# Patient Record
Sex: Female | Born: 1960 | Race: White | Hispanic: No | Smoking: Never smoker
Health system: Southern US, Community
[De-identification: ages and names within clinical notes are randomized; demographics above are authoritative.]

## PROBLEM LIST (undated history)

## (undated) ENCOUNTER — Ambulatory Visit (HOSPITAL_COMMUNITY): Admission: EM | Disposition: A | Discharge status: Home / Self Care | Payer: Medicaid Other | Source: Home / Self Care

## (undated) DIAGNOSIS — G4700 Insomnia, unspecified: Secondary | ICD-10-CM

## (undated) DIAGNOSIS — F32A Depression, unspecified: Secondary | ICD-10-CM

## (undated) DIAGNOSIS — R7989 Other specified abnormal findings of blood chemistry: Secondary | ICD-10-CM

## (undated) DIAGNOSIS — F319 Bipolar disorder, unspecified: Secondary | ICD-10-CM

## (undated) DIAGNOSIS — F1110 Opioid abuse, uncomplicated: Secondary | ICD-10-CM

## (undated) DIAGNOSIS — G4730 Sleep apnea, unspecified: Secondary | ICD-10-CM

## (undated) DIAGNOSIS — M109 Gout, unspecified: Secondary | ICD-10-CM

## (undated) DIAGNOSIS — F1010 Alcohol abuse, uncomplicated: Secondary | ICD-10-CM

## (undated) DIAGNOSIS — I728 Aneurysm of other specified arteries: Secondary | ICD-10-CM

## (undated) DIAGNOSIS — E1142 Type 2 diabetes mellitus with diabetic polyneuropathy: Secondary | ICD-10-CM

## (undated) DIAGNOSIS — F419 Anxiety disorder, unspecified: Secondary | ICD-10-CM

## (undated) DIAGNOSIS — E782 Mixed hyperlipidemia: Secondary | ICD-10-CM

## (undated) DIAGNOSIS — K219 Gastro-esophageal reflux disease without esophagitis: Secondary | ICD-10-CM

## (undated) DIAGNOSIS — I10 Essential (primary) hypertension: Secondary | ICD-10-CM

## (undated) DIAGNOSIS — G4733 Obstructive sleep apnea (adult) (pediatric): Secondary | ICD-10-CM

## (undated) DIAGNOSIS — F329 Major depressive disorder, single episode, unspecified: Secondary | ICD-10-CM

## (undated) DIAGNOSIS — Z8614 Personal history of Methicillin resistant Staphylococcus aureus infection: Secondary | ICD-10-CM

## (undated) DIAGNOSIS — B1920 Unspecified viral hepatitis C without hepatic coma: Secondary | ICD-10-CM

## (undated) DIAGNOSIS — E119 Type 2 diabetes mellitus without complications: Secondary | ICD-10-CM

## (undated) HISTORY — DX: Aneurysm of other specified arteries: I72.8

## (undated) HISTORY — DX: Type 2 diabetes mellitus with diabetic polyneuropathy: E11.42

## (undated) HISTORY — DX: Other specified abnormal findings of blood chemistry: R79.89

## (undated) HISTORY — DX: Obstructive sleep apnea (adult) (pediatric): G47.33

## (undated) HISTORY — DX: Gastro-esophageal reflux disease without esophagitis: K21.9

## (undated) HISTORY — DX: Mixed hyperlipidemia: E78.2

## (undated) HISTORY — PX: BACK SURGERY: SHX140

## (undated) HISTORY — DX: Insomnia, unspecified: G47.00

## (undated) HISTORY — DX: Sleep apnea, unspecified: G47.30

## (undated) HISTORY — PX: WISDOM TOOTH EXTRACTION: SHX21

---

## 2011-09-18 ENCOUNTER — Encounter: Payer: Self-pay | Admitting: Adult Health

## 2011-09-18 ENCOUNTER — Emergency Department (HOSPITAL_COMMUNITY)
Admission: EM | Admit: 2011-09-18 | Discharge: 2011-09-19 | Disposition: A | Discharge status: Other Acute Inpatient Hospital | Payer: Self-pay | Attending: Emergency Medicine | Admitting: Emergency Medicine

## 2011-09-18 ENCOUNTER — Emergency Department (HOSPITAL_COMMUNITY): Payer: Self-pay

## 2011-09-18 ENCOUNTER — Other Ambulatory Visit: Payer: Self-pay

## 2011-09-18 DIAGNOSIS — T50901A Poisoning by unspecified drugs, medicaments and biological substances, accidental (unintentional), initial encounter: Secondary | ICD-10-CM | POA: Insufficient documentation

## 2011-09-18 DIAGNOSIS — R413 Other amnesia: Secondary | ICD-10-CM | POA: Insufficient documentation

## 2011-09-18 DIAGNOSIS — T50902A Poisoning by unspecified drugs, medicaments and biological substances, intentional self-harm, initial encounter: Secondary | ICD-10-CM | POA: Insufficient documentation

## 2011-09-18 DIAGNOSIS — R000 Tachycardia, unspecified: Secondary | ICD-10-CM | POA: Insufficient documentation

## 2011-09-18 DIAGNOSIS — R45851 Suicidal ideations: Secondary | ICD-10-CM | POA: Insufficient documentation

## 2011-09-18 DIAGNOSIS — IMO0002 Reserved for concepts with insufficient information to code with codable children: Secondary | ICD-10-CM | POA: Insufficient documentation

## 2011-09-18 HISTORY — DX: Major depressive disorder, single episode, unspecified: F32.9

## 2011-09-18 HISTORY — DX: Depression, unspecified: F32.A

## 2011-09-18 LAB — COMPREHENSIVE METABOLIC PANEL
ALT: 121 U/L — ABNORMAL HIGH (ref 0–35)
AST: 116 U/L — ABNORMAL HIGH (ref 0–37)
Albumin: 3.3 g/dL — ABNORMAL LOW (ref 3.5–5.2)
Alkaline Phosphatase: 117 U/L (ref 39–117)
BUN: 8 mg/dL (ref 6–23)
CO2: 25 mEq/L (ref 19–32)
Calcium: 9.4 mg/dL (ref 8.4–10.5)
Chloride: 104 mEq/L (ref 96–112)
Creatinine, Ser: 0.86 mg/dL (ref 0.50–1.10)
GFR calc Af Amer: >90 mL/min (ref >90)
GFR calc non Af Amer: 79 mL/min — ABNORMAL LOW (ref >90)
Glucose, Bld: 137 mg/dL — ABNORMAL HIGH (ref 70–99)
Potassium: 3.7 mEq/L (ref 3.5–5.1)
Sodium: 141 mEq/L (ref 135–145)
Total Bilirubin: 0.4 mg/dL (ref 0.3–1.2)
Total Protein: 7 g/dL (ref 6.0–8.3)

## 2011-09-18 LAB — SALICYLATE LEVEL: Salicylate Lvl: <2 mg/dL — ABNORMAL LOW (ref 2.8–20.0)

## 2011-09-18 LAB — CBC
HCT: 44.7 % (ref 36.0–46.0)
Hemoglobin: 15.4 g/dL — ABNORMAL HIGH (ref 12.0–15.0)
MCH: 31.8 pg (ref 26.0–34.0)
MCHC: 34.5 g/dL (ref 30.0–36.0)
MCV: 92.4 fL (ref 78.0–100.0)
Platelets: 271 10*3/uL (ref 150–400)
RBC: 4.84 MIL/uL (ref 3.87–5.11)
RDW: 13.4 % (ref 11.5–15.5)
WBC: 6.9 10*3/uL (ref 4.0–10.5)

## 2011-09-18 LAB — LACTIC ACID, PLASMA: Lactic Acid, Venous: 2.5 mmol/L — ABNORMAL HIGH (ref 0.5–2.2)

## 2011-09-18 LAB — RAPID URINE DRUG SCREEN, HOSP PERFORMED
Amphetamines: NOT DETECTED
Barbiturates: NOT DETECTED
Benzodiazepines: POSITIVE — AB
Cocaine: POSITIVE — AB
Opiates: NOT DETECTED
Tetrahydrocannabinol: NOT DETECTED

## 2011-09-18 LAB — POCT PREGNANCY, URINE: Preg Test, Ur: NEGATIVE

## 2011-09-18 LAB — ACETAMINOPHEN LEVEL: Acetaminophen (Tylenol), Serum: <15 ug/mL (ref 10–30)

## 2011-09-18 LAB — ETHANOL: Alcohol, Ethyl (B): 188 mg/dL — ABNORMAL HIGH (ref 0–11)

## 2011-09-18 LAB — TROPONIN I: Troponin I: <0.3 ng/mL (ref <0.30)

## 2011-09-18 LAB — PREGNANCY, URINE: Preg Test, Ur: NEGATIVE

## 2011-09-18 LAB — AMMONIA

## 2011-09-18 MED ORDER — ONDANSETRON HCL 4 MG PO TABS
4.0000 mg | ORAL_TABLET | Freq: Three times a day (TID) | ORAL | Status: DC | PRN
Start: 2011-09-18 — End: 2011-09-19

## 2011-09-18 MED ORDER — HALOPERIDOL LACTATE 5 MG/ML IJ SOLN
INTRAMUSCULAR | Status: AC
  Administered 2011-09-18: 22:00:00
  Filled 2011-09-18: qty 2

## 2011-09-18 MED ORDER — ACETAMINOPHEN 325 MG PO TABS: 650.0000 mg | ORAL_TABLET | ORAL | Status: DC | PRN

## 2011-09-18 MED ORDER — HALOPERIDOL LACTATE 5 MG/ML IJ SOLN
5.0000 mg | Freq: Once | INTRAMUSCULAR | Status: AC
  Administered 2011-09-18: 5 mg via INTRAMUSCULAR
  Filled 2011-09-18: qty 1

## 2011-09-18 MED ORDER — LORAZEPAM 1 MG PO TABS: 1.0000 mg | ORAL_TABLET | Freq: Three times a day (TID) | ORAL | Status: DC | PRN

## 2011-09-18 MED ORDER — HALOPERIDOL LACTATE 5 MG/ML IJ SOLN: 10.0000 mg | Freq: Once | INTRAMUSCULAR | Status: DC

## 2011-09-18 MED ORDER — HALOPERIDOL LACTATE 5 MG/ML IJ SOLN: 5.0000 mg | Freq: Once | INTRAMUSCULAR | Status: DC

## 2011-09-18 MED ORDER — SODIUM CHLORIDE 0.9 % IV BOLUS (SEPSIS)
1000.0000 mL | Freq: Once | INTRAVENOUS | Status: AC
  Administered 2011-09-18: 1000 mL via INTRAVENOUS

## 2011-09-18 NOTE — ED Notes (Signed)
Patient is resting comfortably. 

## 2011-09-18 NOTE — ED Notes (Signed)
Called to bedside per pt request-pt requesting foley cath removal-pt is verbally abusive, cursing and yelling, threatening-explained need for foley due to restraints-pt remains cursing and verbally abusive-Dr. Manus Gunning is aware-pt has been medicated-restraints x 4-pt threatening to pull foley out-[employer] with pt-IVC papers in process

## 2011-09-18 NOTE — ED Notes (Signed)
Pt requesting chief administrator of hospital. Pt verbally abusive to staff. Drowsy.

## 2011-09-18 NOTE — ED Notes (Signed)
Reported overdose on 150 mg of ambien, 200 mg of prozac and 30 mg of clonezepam. Pt is combative

## 2011-09-18 NOTE — ED Notes (Signed)
ION:GEXB<MW> Expected date:<BR> Expected time:<BR> Means of arrival:<BR> Comments:<BR> EMS/suicidal-took &gt;100 ambien and unknown prozac

## 2011-09-18 NOTE — ED Notes (Signed)
Pt screaming, "Kill me, I want to die, give me something to kill me. Your not helping me."

## 2011-09-18 NOTE — ED Notes (Signed)
Pt verbally abusive to staff, explained need and reason for catheter. Pt continues to try to release restraints

## 2011-09-18 NOTE — ED Notes (Signed)
Taken out of bilateral upper and lower extremity restraints, pt is resting at this time and cooperative. Pt verbalized understanding.

## 2011-09-18 NOTE — ED Notes (Signed)
Charge nurse at bedside. Explained need and reason for catheter. Dr. Manus Gunning notified. Pt continueso scream out with verbal abuse calling staff members names and screaming curses at RN. Pt threatened RN life. Stated she is going to kill Charity fundraiser.

## 2011-09-18 NOTE — ED Notes (Signed)
Pt complaining of foley causing pain.  MD notified, Pt informed of why we have the foley in (I&O) and kidney function.  Pt screaming, and swearing at staff and husband.  MD states that it is okay to remove.

## 2011-09-18 NOTE — ED Notes (Signed)
Pt being verbally abusive of staff. Pt requesting to have catheter pulled out. Family at bedside. Family understands reason for catheter.

## 2011-09-19 ENCOUNTER — Inpatient Hospital Stay (HOSPITAL_COMMUNITY)
Admission: AD | Admit: 2011-09-19 | Discharge: 2011-09-26 | DRG: 885 | Payer: Self-pay | Source: Ambulatory Visit | Attending: Psychiatry | Admitting: Psychiatry

## 2011-09-19 ENCOUNTER — Encounter (HOSPITAL_COMMUNITY): Payer: Self-pay

## 2011-09-19 DIAGNOSIS — F4310 Post-traumatic stress disorder, unspecified: Secondary | ICD-10-CM

## 2011-09-19 DIAGNOSIS — J449 Chronic obstructive pulmonary disease, unspecified: Secondary | ICD-10-CM

## 2011-09-19 DIAGNOSIS — Z886 Allergy status to analgesic agent status: Secondary | ICD-10-CM

## 2011-09-19 DIAGNOSIS — F1010 Alcohol abuse, uncomplicated: Secondary | ICD-10-CM

## 2011-09-19 DIAGNOSIS — F4329 Adjustment disorder with other symptoms: Secondary | ICD-10-CM | POA: Diagnosis present

## 2011-09-19 DIAGNOSIS — J4489 Other specified chronic obstructive pulmonary disease: Secondary | ICD-10-CM

## 2011-09-19 DIAGNOSIS — Z818 Family history of other mental and behavioral disorders: Secondary | ICD-10-CM

## 2011-09-19 DIAGNOSIS — Z590 Homelessness: Secondary | ICD-10-CM

## 2011-09-19 DIAGNOSIS — F1320 Sedative, hypnotic or anxiolytic dependence, uncomplicated: Secondary | ICD-10-CM

## 2011-09-19 DIAGNOSIS — F1910 Other psychoactive substance abuse, uncomplicated: Secondary | ICD-10-CM

## 2011-09-19 DIAGNOSIS — T50902A Poisoning by unspecified drugs, medicaments and biological substances, intentional self-harm, initial encounter: Secondary | ICD-10-CM

## 2011-09-19 DIAGNOSIS — T50901A Poisoning by unspecified drugs, medicaments and biological substances, accidental (unintentional), initial encounter: Secondary | ICD-10-CM

## 2011-09-19 DIAGNOSIS — F332 Major depressive disorder, recurrent severe without psychotic features: Principal | ICD-10-CM

## 2011-09-19 DIAGNOSIS — F3162 Bipolar disorder, current episode mixed, moderate: Secondary | ICD-10-CM | POA: Diagnosis present

## 2011-09-19 MED ORDER — LORAZEPAM 1 MG PO TABS
1.0000 mg | ORAL_TABLET | Freq: Three times a day (TID) | ORAL | Status: DC | PRN
  Administered 2011-09-19: 1 mg via ORAL
  Filled 2011-09-19: qty 1

## 2011-09-19 MED ORDER — MAGNESIUM HYDROXIDE 400 MG/5ML PO SUSP: 30.0000 mL | Freq: Every day | ORAL | Status: DC | PRN

## 2011-09-19 MED ORDER — ZIPRASIDONE MESYLATE 20 MG IM SOLR: 20.0000 mg | Freq: Two times a day (BID) | INTRAMUSCULAR | Status: DC | PRN

## 2011-09-19 MED ORDER — ACETAMINOPHEN 325 MG PO TABS: 650.0000 mg | ORAL_TABLET | Freq: Four times a day (QID) | ORAL | Status: DC | PRN

## 2011-09-19 MED ORDER — ALUM & MAG HYDROXIDE-SIMETH 200-200-20 MG/5ML PO SUSP: 30.0000 mL | ORAL | Status: DC | PRN

## 2011-09-19 NOTE — ED Provider Notes (Signed)
History     CSN: [id number]  Arrival date & time 09/18/11  2023   First MD Initiated Contact with Patient 09/18/11 2039      Chief Complaint  Patient presents with  . Drug Overdose    (Consider location/radiation/quality/duration/timing/severity/associated sxs/prior treatment) HPI Comments: Reported overdose of prozac, ambien and klonopin, sometime this afternoon, reportedly 2-3 hours ago.  PAtient waxing and waning between somnolence and agitation.  Admits was suicide attempt.  No evidence of trauma.  The history is provided by the patient and the EMS personnel.    Past Medical History  Diagnosis Date  . Depression   . COPD (chronic obstructive pulmonary disease)   . Asthma     History reviewed. No pertinent past surgical history.  History reviewed. No pertinent family history.  History  Substance Use Topics  . Smoking status: Not on file  . Smokeless tobacco: Not on file  . Alcohol Use: Yes    OB History    Grav Para Term Preterm Abortions TAB SAB Ect Mult Living                  Review of Systems  Unable to perform ROS: Mental status change    Allergies  Aspirin  Home Medications  No current outpatient prescriptions on file.  BP 115/62  Pulse 87  Temp(Src) 98.2 F (36.8 C) (Core (Comment))  Resp 18  SpO2 5%  Physical Exam  Constitutional: She appears well-developed and well-nourished.       Combatitve, screaming obscenities  HENT:  Head: Normocephalic and atraumatic.  Mouth/Throat: Oropharynx is clear and moist. No oropharyngeal exudate.  Eyes: Conjunctivae and EOM are normal. Pupils are equal, round, and reactive to light.  Neck: Normal range of motion. Neck supple.       No meningismus  Cardiovascular: Normal rate, regular rhythm and normal heart sounds.        tachycardic  Pulmonary/Chest: Breath sounds normal. No respiratory distress.  Abdominal: Soft. There is no tenderness. There is no rebound and no guarding.  Musculoskeletal:  Normal range of motion. She exhibits no edema.  Neurological: She is alert.       noncooperative with exam.  moveing all extremities, no apparent deficits  Skin: Skin is warm and dry.  Psychiatric: Her affect is angry, labile and inappropriate. Her speech is rapid and/or pressured. She is agitated, aggressive and combative. Cognition and memory are impaired. She expresses impulsivity and inappropriate judgment. She expresses suicidal ideation. She exhibits abnormal recent memory and abnormal remote memory.    ED Course  Procedures (including critical care time)  Labs Reviewed  CBC - Abnormal; Notable for the following:    Hemoglobin 15.4 (*)    All other components within normal limits  ETHANOL - Abnormal; Notable for the following:    Alcohol, Ethyl (B) 188 (*)    All other components within normal limits  COMPREHENSIVE METABOLIC PANEL - Abnormal; Notable for the following:    Glucose, Bld 137 (*)    Albumin 3.3 (*)    AST 116 (*)    ALT 121 (*)    GFR calc non Af Amer 79 (*)    All other components within normal limits  URINE RAPID DRUG SCREEN (HOSP PERFORMED) - Abnormal; Notable for the following:    Cocaine POSITIVE (*)    Benzodiazepines POSITIVE (*)    All other components within normal limits  SALICYLATE LEVEL - Abnormal; Notable for the following:    Salicylate Lvl <2.0 (*)  All other components within normal limits  LACTIC ACID, PLASMA - Abnormal; Notable for the following:    Lactic Acid, Venous 2.5 (*)    All other components within normal limits  ACETAMINOPHEN LEVEL  PREGNANCY, URINE  AMMONIA  TROPONIN I  POCT PREGNANCY, URINE  POCT PREGNANCY, URINE   Dg Chest Portable 1 View  09/18/2011  *RADIOLOGY REPORT*  Clinical Data: Altered mental status  PORTABLE CHEST - 1 VIEW  Comparison: None  Findings: The heart size and mediastinal contours are within normal limits.  Both lungs are clear.  The visualized skeletal structures are unremarkable.  IMPRESSION: No acute  cardiopulmonary abnormalities  Original Report Authenticated By: Rosealee Albee, M.D.     1. Suicidal ideation   2. Drug overdose       MDM  Reported overdose on prozac, ambien, klonopin.  Tachycardic, normothermic, BP stable.  Smells of alcohol.  NO trauma, no apparent neuro deficits.  Skin warm and dry. No evidence of serotonin syndrome. No clonus or tremors.  NO hyperthermia. Supportive care.  Tox labs, EKG, chemical and physical restraint prn.     Date: 09/19/2011  Rate: 109  Rhythm: sinus tachycardia  QRS Axis: normal  Intervals: normal  ST/T Wave abnormalities: normal  Conduction Disutrbances:none  Narrative Interpretation:   Old EKG Reviewed: none available  CRITICAL CARE Performed by: Glynn Octave   Total critical care time: 30  Critical care time was exclusive of separately billable procedures and treating other patients.  Critical care was necessary to treat or prevent imminent or life-threatening deterioration.  Critical care was time spent personally by me on the following activities: development of treatment plan with patient and/or surrogate as well as nursing, discussions with consultants, evaluation of patient's response to treatment, examination of patient, obtaining history from patient or surrogate, ordering and performing treatments and interventions, ordering and review of laboratory studies, ordering and review of radiographic studies, pulse oximetry and re-evaluation of patient's condition.   Glynn Octave, MD 09/19/11 [phone]

## 2011-09-19 NOTE — ED Notes (Signed)
Received a phone call from [place], [place] RN, pertaining to patient's belongings. No belongings found in ED or with security. Consulting civil engineer notified.

## 2011-09-19 NOTE — ED Notes (Signed)
Please call Jonny Ruiz, husband with any changes.

## 2011-09-19 NOTE — ED Notes (Signed)
O2 sats remain elevated with blow by O2 .  Continues with sitter 1:1

## 2011-09-19 NOTE — BH Assessment (Signed)
Assessment Note   Kristina Huffman is a 50 y.o. female who presents to [facility] with SI/Depression.  Pt reportedly overdosed on 150mg  of ambien, 200mg  of prozac and 30mg  of clonzepam.  Per pt.'s spouse she ingested 15 ambien and 10 pills of unknown origin.  Pt is drowsy, lethargic, info gathered from spouse and physician.  Upon arrival to ed, pt was verbally abusive to staff and would not allow med tx(refused foley); pt also restrained to allow for tx.  Pt given meds to calm her.  Per spouse, pt has been depressed and in the hosp([facility]) 3x's since son committed SI in 03/2011.  This is the 1st attempt to harm self, pt has only had SI thoughts in the past.  Pt told staff/[employer] that has been having difficult time this holiday without son.  Pt is now calm and sleeping.  Pt is IVC.  Pt denied regular SA use but positive for cocaine. Info sent to [facility] to review.    Axis I: Major Depression, Recurrent severe Axis II: Deferred Axis III:  Past Medical History  Diagnosis Date  . Depression   . COPD (chronic obstructive pulmonary disease)   . Asthma    Axis IV: other psychosocial or environmental problems and problems with primary support group Axis V: 21-30 behavior considerably influenced by delusions or hallucinations OR serious impairment in judgment, communication OR inability to function in almost all areas  Past Medical History:  Past Medical History  Diagnosis Date  . Depression   . COPD (chronic obstructive pulmonary disease)   . Asthma     History reviewed. No pertinent past surgical history.  Family History: History reviewed. No pertinent family history.  Social History:  does not have a smoking history on file. She does not have any smokeless tobacco history on file. She reports that she drinks alcohol. She reports that she uses illicit drugs (Cocaine).  Additional Social History:  Alcohol / Drug Use Pain Medications: None  Prescriptions: None  Over the Counter: None  History of  alcohol / drug use?: No history of alcohol / drug abuse Longest period of sobriety (when/how long): None  Allergies:  Allergies  Allergen Reactions  . Aspirin     Home Medications:  Medications Prior to Admission  Medication Dose Route Frequency Provider Last Rate Last Dose  . acetaminophen (TYLENOL) tablet 650 mg  650 mg Oral Q4H PRN Glynn Octave, MD      . haloperidol lactate (HALDOL) 5 MG/ML injection           . haloperidol lactate (HALDOL) injection 5 mg  5 mg Intramuscular Once Glynn Octave, MD   5 mg at 09/18/11 2138  . haloperidol lactate (HALDOL) injection 5 mg  5 mg Intramuscular Once Glynn Octave, MD      . LORazepam (ATIVAN) tablet 1 mg  1 mg Oral Q8H PRN Glynn Octave, MD      . ondansetron [facility]) tablet 4 mg  4 mg Oral Q8H PRN Glynn Octave, MD      . sodium chloride 0.9 % bolus 1,000 mL  1,000 mL Intravenous Once Glynn Octave, MD   1,000 mL at 09/18/11 2120  . DISCONTD: haloperidol lactate (HALDOL) injection 10 mg  10 mg Intramuscular Once Glynn Octave, MD       No current outpatient prescriptions on file as of 09/18/2011.    OB/GYN Status:  No LMP recorded.  General Assessment Data Location of Assessment: [facility] ED Living Arrangements: Spouse/significant other Can pt return  to current living arrangement?: Yes Admission Status: Involuntary Is patient capable of signing voluntary admission?: No Transfer from: Acute Hospital Referral Source: MD  Education Status Is patient currently in school?: No Current Grade: None  Highest grade of school patient has completed: None  Name of school: None  Contact person: None   Risk to self Suicidal Ideation: Yes-Currently Present Suicidal Intent: Yes-Currently Present Is patient at risk for suicide?: Yes Suicidal Plan?: Yes-Currently Present Specify Current Suicidal Plan: Overdose on Medications  Access to Means: Yes Specify Access to Suicidal Means: Medications  What has been your use of  drugs/alcohol within the last 12 months?: None--Pt had dinner wine tonite per spouse  Previous Attempts/Gestures: No How many times?: 0  Other Self Harm Risks: None  Triggers for Past Attempts: Other (Comment) (Son commited SI in 03/2011) Intentional Self Injurious Behavior: None Family Suicide History: No Recent stressful life event(s): Trauma (Comment) (Pt.'s son comitted SI 03/2011) Persecutory voices/beliefs?: No Depression: Yes Depression Symptoms: Tearfulness;Loss of interest in usual pleasures;Feeling worthless/self pity Substance abuse history and/or treatment for substance abuse?: No Suicide prevention information given to non-admitted patients: Not applicable  Risk to Others Homicidal Ideation: No Thoughts of Harm to Others: No Current Homicidal Intent: No Current Homicidal Plan: No Access to Homicidal Means: No Identified Victim: None  History of harm to others?: Yes Assessment of Violence: On admission Violent Behavior Description: Pt. verbally abusive to staff, trying to remove IV's  Does patient have access to weapons?: No Criminal Charges Pending?: No Does patient have a court date: No  Psychosis Hallucinations: None noted Delusions: None noted  Mental Status Report Appear/Hygiene: Disheveled Eye Contact: Poor Motor Activity: Unremarkable Speech: Slurred;Slow Level of Consciousness: Drowsy Mood: Depressed;Despair;Anhedonia;Sad Affect: Depressed;Sad Anxiety Level: None Thought Processes: Relevant Judgement: Impaired Orientation: Person;Place;Time;Situation Obsessive Compulsive Thoughts/Behaviors: None  Cognitive Functioning Concentration: Normal Memory: Recent Intact;Remote Intact IQ: Average Insight: Poor Impulse Control: Poor Appetite: Poor Weight Loss: 0  Weight Gain: 0  Sleep: Decreased Total Hours of Sleep: 4  Vegetative Symptoms: None  Prior Inpatient Therapy Prior Inpatient Therapy: Yes Prior Therapy Dates: 2012 Prior Therapy  Facilty/Provider(s): [facility]  Reason for Treatment: Depression/SI   Prior Outpatient Therapy Prior Outpatient Therapy: No Prior Therapy Dates: None  Prior Therapy Facilty/Provider(s): None  Reason for Treatment: None   ADL Screening (condition at time of admission) Patient's cognitive ability adequate to safely complete daily activities?: Yes Patient able to express need for assistance with ADLs?: Yes Independently performs ADLs?: Yes Weakness of Legs: None Weakness of Arms/Hands: None       Abuse/Neglect Assessment (Assessment to be complete while patient is alone) Physical Abuse: Denies Verbal Abuse: Denies Sexual Abuse: Denies Exploitation of patient/patient's resources: Denies Self-Neglect: Denies Values / Beliefs Cultural Requests During Hospitalization: None Spiritual Requests During Hospitalization: None Consults Spiritual Care Consult Needed: No Social Work Consult Needed: No Merchant navy officer (For Healthcare) Advance Directive: Patient does not have advance directive;Patient would not like information Pre-existing out of facility DNR order (yellow form or pink MOST form): No    Additional Information 1:1 In Past 12 Months?: No CIRT Risk: No Elopement Risk: No Does patient have medical clearance?: Yes     Disposition:  Disposition Disposition of Patient: Inpatient treatment program;Referred to [facility] ) Type of inpatient treatment program: Adult Patient referred to: Other (Comment) [facility])  On Site Evaluation by:   Reviewed with Physician:     Murrell Redden 09/19/2011 12:22 AM

## 2011-09-19 NOTE — Progress Notes (Signed)
Patient ID: Kristina Huffman, female   DOB: 15-Jan-1961, 50 y.o.   MRN: [id number] 09/19/2011 Kristina Huffman is having a difficult time...Kristina Huffman KitchenSHe is here S/ P OD attempt and now is in the hospital ( for continued assessment of back pain demanding all types of  Of treatments , medications and therpaies.

## 2011-09-19 NOTE — Progress Notes (Signed)
Patient has been accepted to [facility] by [place] bed 504-1. Patient's support paperwork has been completed. EDP notified and is in agreement with disposition. EDP will discharge pt to [facility]. Pt nurse notified as well. ALL appropriate paperwork completed and forwarded to [facility] for review.   Ileene Hutchinson , MSW, LCSWA 09/19/2011 9:01 AM [phone]

## 2011-09-19 NOTE — ED Notes (Signed)
Patient received sleeping soundly but easily arousable.  Blow by O2 on sating at 92%

## 2011-09-19 NOTE — Tx Team (Signed)
Initial Interdisciplinary Treatment Plan  PATIENT STRENGTHS: (choose at least two) Ability for insight Average or above average intelligence Capable of independent living Communication skills Supportive family/friends  PATIENT STRESSORS: Financial difficulties Loss of son* Substance abuse Traumatic event   PROBLEM LIST: Problem List/Patient Goals Date to be addressed Date deferred Reason deferred Estimated date of resolution  Depression 12/28     Cocaine abuse recently 12/28     ETOH recently 12/28                                          DISCHARGE CRITERIA:  Adequate post-discharge living arrangements Improved stabilization in mood, thinking, and/or behavior Reduction of life-threatening or endangering symptoms to within safe limits Withdrawal symptoms are absent or subacute and managed without 24-hour nursing intervention  PRELIMINARY DISCHARGE PLAN: Attend aftercare/continuing care group Outpatient therapy Return to previous living arrangement  PATIENT/FAMIILY INVOLVEMENT: This treatment plan has been presented to and reviewed with the patient, Kristina Huffman, and/or family member,.  The patient and family have been given the opportunity to ask questions and make suggestions.  Manuela Schwartz [facility] 09/19/2011, 1:34 PM

## 2011-09-19 NOTE — Progress Notes (Signed)
Patient ID: Kristina Huffman, female   DOB: Mar 05, 1961, 50 y.o.   MRN: [id number]   Patient is a 50yr old invol admission. Overdosed on 150mg  of ambien, 200mg  of prozac, and 30mg  of klonopin. Has been at [facility] in the past for depression x 2 since 53yr old son passed away back in 05/03/23 of this year. Patient agitated and combative when first in ED. Also had ETOH and cocaine in system. Reports back surgery in past but uses over the counter meds for this. Allergic to ASA. Cooperative with admission but still feels sedated from overdose.

## 2011-09-19 NOTE — ED Notes (Signed)
Patient denies pain and is resting comfortably.  

## 2011-09-19 NOTE — ED Notes (Signed)
Vital signs stable. 

## 2011-09-19 NOTE — ED Notes (Signed)
Patient is resting comfortably. 

## 2011-09-19 NOTE — H&P (Signed)
Psychiatric Admission Assessment Adult  Patient Identification:  Kristina Huffman Date of Evaluation:  09/19/2011 Chief Complaint:  Major Depressive Disorder, recurrent, severe, without psychotic features 296.33  History of Present Illness::Kristina Huffman is a 50 year old Caucasian female. Patient was admitted from the [facility] ED with complaints of attempted suicide by overdose on multiple psychotropic medications. Patient reports, "My 48 year old son overdosed himself and died from it. I found him sitting up on the bed. I went closer and touched him on his shoulder to assure he was all right. He fell backwards on the bed and landed on my chest. I held him closer, by then he was already gone. I can't get that image off my mind. I don't know how to cope or deal with such a thing so horrible. I am having difficulty accepting it. This happened on July 2nd 2012. I take pills for depression, sleep and for my nerves. I decided to take bunch of them together. I wanted to end the hurt.  I took a lot of pills. I became very sleepy, can barely walk. My husband realized what I had done, panicked and called 911. I feel just horribly, disappointed and lost. He was only 19".  Mood Symptoms:  Anhedonia Depression Guilt Helplessness Hopelessness Sadness SI Depression Symptoms:  depressed mood, feelings of worthlessness/guilt and hopelessness (Hypo) Manic Symptoms:   Elevated Mood:  No Irritable Mood:  Yes Grandiosity:  No Distractibility:  No Labiality of Mood:  No Delusions:  No Hallucinations:  No Impulsivity:  Yes Sexually Inappropriate Behavior:  No Financial Extravagance:  No Flight of Ideas:  No  Anxiety Symptoms: Excessive Worry:  Yes Panic Symptoms:  Yes Agoraphobia:  No Obsessive Compulsive: No  Symptoms: None Specific Phobias:  No Social Anxiety:  No  Psychotic Symptoms:  Hallucinations:  None Delusions:  No Paranoia:  No   Ideas of Reference:  No  PTSD Symptoms: Ever  had a traumatic exposure:  Yes Had a traumatic exposure in the last month:  No Re-experiencing:  Flashbacks Intrusive Thoughts Hypervigilance:  No Hyperarousal:  None Avoidance:  None  Traumatic Brain Injury:  None reported  Past Psychiatric History: Diagnosis: Suicidal ideation and attempt by over dose, Major depressive disorder, recurrent, PTSD  Hospitalizations:[facility]  Outpatient Care: [facility], in [place], [place] with Dr. Woodroe Mode  Substance Abuse Care: None reported  Self-Mutilation:None reported  Suicidal Attempts: Yes, by overdose  Violent Behaviors: None reported.   Past Medical History:   Past Medical History  Diagnosis Date  . Depression   . COPD (chronic obstructive pulmonary disease)   . Asthma    History of Loss of Consciousness:  No Seizure History:  No Cardiac History:  No  ROS: Reviewed ED ROS on file. Patient denies any chest pains and or SOB during this assessment.    Allergies:   Allergies  Allergen Reactions  . Aspirin    Current Medications:  Current Facility-Administered Medications  Medication Dose Route Frequency Provider Last Rate Last Dose  . acetaminophen (TYLENOL) tablet 650 mg  650 mg Oral Q6H PRN Shamsher Alvie Heidelberg, MD      . alum & mag hydroxide-simeth (MAALOX/MYLANTA) 200-200-20 MG/5ML suspension 30 mL  30 mL Oral Q4H PRN Shamsher Alvie Heidelberg, MD      . LORazepam (ATIVAN) tablet 1 mg  1 mg Oral Q8H PRN Shamsher Alvie Heidelberg, MD      . magnesium hydroxide (MILK OF MAGNESIA) suspension 30 mL  30 mL Oral Daily PRN Shamsher S  Ahluwalia, MD      . ziprasidone (GEODON) injection 20 mg  20 mg Intramuscular BID PRN Katheren Puller, MD       Facility-Administered Medications Ordered in Other Encounters  Medication Dose Route Frequency Provider Last Rate Last Dose  . haloperidol lactate (HALDOL) 5 MG/ML injection           . haloperidol lactate (HALDOL) injection 5 mg  5 mg Intramuscular Once Glynn Octave, MD   5 mg at 09/18/11 2138  . sodium  chloride 0.9 % bolus 1,000 mL  1,000 mL Intravenous Once Glynn Octave, MD   1,000 mL at 09/18/11 2120  . DISCONTD: acetaminophen (TYLENOL) tablet 650 mg  650 mg Oral Q4H PRN Glynn Octave, MD      . DISCONTD: haloperidol lactate (HALDOL) injection 10 mg  10 mg Intramuscular Once Glynn Octave, MD      . DISCONTD: haloperidol lactate (HALDOL) injection 5 mg  5 mg Intramuscular Once Glynn Octave, MD      . DISCONTD: LORazepam (ATIVAN) tablet 1 mg  1 mg Oral Q8H PRN Glynn Octave, MD      . DISCONTD: ondansetron (ZOFRAN) tablet 4 mg  4 mg Oral Q8H PRN Glynn Octave, MD        Previous Psychotropic Medications:  Medication Dose  Ambien  ?  Prozac ?  Klonopin ?               Substance Abuse History in the last 12 months: Substance Age of 1st Use Last Use Amount Specific Type  Nicotine Patient denies use of any other drugs other than the ones prescribed by her physician.     Alcohol      Cannabis      Opiates      Cocaine      Methamphetamines      LSD      Ecstasy      Benzodiazepines      Caffeine      Inhalants      Others:                         Medical Consequences of Substance Abuse: Liver damage  Legal Consequences of Substance Abuse: Arrests/jail time  Family Consequences of Substance Abuse: Family discord  Blackouts:  Yes, "after taking that many pills" DT's:  No Withdrawal Symptoms:  None  Social History: Current Place of Residence:  [place], [place] Place of Birth:  [place] Family Members: Husband and children Marital Status:  Married   Education:  HS Graduate  History of Abuse (Emotional/Phsycial/Sexual): None reported Hydrologist History:  None reported Legal History:None reported   Family History: Depression: Mother& brother, Suicide: son  Mental Status Examination/Evaluation: Objective:  Appearance: Fairly Groomed  Patent attorney::  Fair  Speech:  Slow  Volume:  Decreased  Mood:  "My  heart is broken"  Affect:  Flat, teary  Thought Process:  Linear  Orientation:  Full  Thought Content:  Hallucinations: None  Suicidal Thoughts:  Yes.  with intent/plan  Homicidal Thoughts:  No  Judgement:  Impaired  Insight:  Fair  Psychomotor Activity:  Decreased  Akathisia:  No  Handed:  Right    Assets:  Desire for Improvement Resilience           Assessment:    AXIS I Substance Abuse, Suicidal ideation, Major depressive disorder, recurrent  AXIS II Deferred  AXIS III Past Medical History  Diagnosis Date  .  Depression   . COPD (chronic obstructive pulmonary disease)   . Asthma      AXIS IV other psychosocial or environmental problems  AXIS V 11-20 some danger of hurting self or others possible OR occasionally fails to maintain minimal personal hygiene OR gross impairment in communication     Treatment Plan Summary: Continue current treatment plan. Daily contact with patient to assess and evaluate symptoms and progress in treatment Medication management  Observation Level/Precautions:  Q 15 minutes checks for safety  Laboratory:  Reviewed ED lab findings on file.  Psychotherapy:  Group  Medications:  See lists.  Routine PRN Medications:  Yes  Consultations:  None indicated at this time  Discharge Concerns:  Safety  Other:      Armandina Stammer I 12/28/20124:24 PM

## 2011-09-19 NOTE — ED Notes (Signed)
poison  Control called regarding patient' over dose for recommendations for treatment so patient can be placed in a treatment program

## 2011-09-19 NOTE — ED Notes (Signed)
Talked with Patty at poison control and they have reviewed the casea and have medically cleared the patient from their standpoint

## 2011-09-19 NOTE — ED Provider Notes (Signed)
Patient accepted to behavioral health by Dr. Rogers Blocker.  Cyndra Numbers, MD 09/19/11 [phone]

## 2011-09-20 DIAGNOSIS — F1020 Alcohol dependence, uncomplicated: Secondary | ICD-10-CM

## 2011-09-20 LAB — HEPATIC FUNCTION PANEL
ALT: 112 U/L — ABNORMAL HIGH (ref 0–35)
AST: 96 U/L — ABNORMAL HIGH (ref 0–37)
Albumin: 3.3 g/dL — ABNORMAL LOW (ref 3.5–5.2)
Alkaline Phosphatase: 112 U/L (ref 39–117)
Bilirubin, Direct: 0.1 mg/dL (ref 0.0–0.3)
Indirect Bilirubin: 0.2 mg/dL — ABNORMAL LOW (ref 0.3–0.9)
Total Bilirubin: 0.3 mg/dL (ref 0.3–1.2)
Total Protein: 6.6 g/dL (ref 6.0–8.3)

## 2011-09-20 MED ORDER — ADULT MULTIVITAMIN W/MINERALS CH
1.0000 | ORAL_TABLET | Freq: Every day | ORAL | Status: DC
  Filled 2011-09-20: qty 1

## 2011-09-20 MED ORDER — ADULT MULTIVITAMIN W/MINERALS CH: 1.0000 | ORAL_TABLET | Freq: Every day | ORAL | Status: DC

## 2011-09-20 MED ORDER — CHLORDIAZEPOXIDE HCL 25 MG PO CAPS
50.0000 mg | ORAL_CAPSULE | Freq: Once | ORAL | Status: AC
  Administered 2011-09-20: 50 mg via ORAL
  Filled 2011-09-20: qty 2

## 2011-09-20 MED ORDER — THIAMINE HCL 100 MG/ML IJ SOLN: 100.0000 mg | Freq: Once | INTRAMUSCULAR | Status: DC

## 2011-09-20 MED ORDER — FLUOXETINE HCL 20 MG PO CAPS
20.0000 mg | ORAL_CAPSULE | Freq: Every day | ORAL | Status: DC
  Administered 2011-09-20 – 2011-09-21 (×2): 20 mg via ORAL
  Filled 2011-09-20: qty 1

## 2011-09-20 MED ORDER — CHLORDIAZEPOXIDE HCL 25 MG PO CAPS
25.0000 mg | ORAL_CAPSULE | Freq: Four times a day (QID) | ORAL | Status: AC
  Administered 2011-09-20 – 2011-09-21 (×5): 25 mg via ORAL
  Filled 2011-09-20 (×5): qty 1

## 2011-09-20 MED ORDER — VITAMIN B-1 100 MG PO TABS: 100.0000 mg | ORAL_TABLET | Freq: Every day | ORAL | Status: DC

## 2011-09-20 MED ORDER — ONDANSETRON 4 MG PO TBDP
4.0000 mg | ORAL_TABLET | Freq: Four times a day (QID) | ORAL | Status: AC | PRN
  Administered 2011-09-21: 4 mg via ORAL
  Filled 2011-09-20: qty 1

## 2011-09-20 MED ORDER — CHLORDIAZEPOXIDE HCL 25 MG PO CAPS
25.0000 mg | ORAL_CAPSULE | Freq: Three times a day (TID) | ORAL | Status: AC
  Administered 2011-09-22 (×3): 25 mg via ORAL
  Filled 2011-09-20 (×3): qty 1

## 2011-09-20 MED ORDER — HYDROXYZINE HCL 25 MG PO TABS: 25.0000 mg | ORAL_TABLET | Freq: Four times a day (QID) | ORAL | Status: DC | PRN

## 2011-09-20 MED ORDER — HYDROXYZINE HCL 25 MG PO TABS
25.0000 mg | ORAL_TABLET | Freq: Four times a day (QID) | ORAL | Status: AC | PRN
  Administered 2011-09-20 – 2011-09-22 (×3): 25 mg via ORAL
  Filled 2011-09-20: qty 1

## 2011-09-20 MED ORDER — CHLORDIAZEPOXIDE HCL 25 MG PO CAPS
25.0000 mg | ORAL_CAPSULE | Freq: Four times a day (QID) | ORAL | Status: AC | PRN
  Administered 2011-09-21 – 2011-09-22 (×3): 25 mg via ORAL
  Filled 2011-09-20 (×4): qty 1

## 2011-09-20 MED ORDER — CHLORDIAZEPOXIDE HCL 25 MG PO CAPS: 25.0000 mg | ORAL_CAPSULE | Freq: Four times a day (QID) | ORAL | Status: DC | PRN

## 2011-09-20 MED ORDER — ONDANSETRON 4 MG PO TBDP: 4.0000 mg | ORAL_TABLET | Freq: Four times a day (QID) | ORAL | Status: DC | PRN

## 2011-09-20 MED ORDER — FLUOXETINE HCL 20 MG PO CAPS
20.0000 mg | ORAL_CAPSULE | Freq: Every day | ORAL | Status: DC
  Filled 2011-09-20 (×3): qty 1

## 2011-09-20 MED ORDER — ALUM & MAG HYDROXIDE-SIMETH 200-200-20 MG/5ML PO SUSP: 30.0000 mL | ORAL | Status: DC | PRN

## 2011-09-20 MED ORDER — CHLORDIAZEPOXIDE HCL 25 MG PO CAPS
25.0000 mg | ORAL_CAPSULE | Freq: Every day | ORAL | Status: AC
  Administered 2011-09-24: 25 mg via ORAL
  Filled 2011-09-20: qty 1

## 2011-09-20 MED ORDER — ACETAMINOPHEN 325 MG PO TABS: 650.0000 mg | ORAL_TABLET | Freq: Four times a day (QID) | ORAL | Status: DC | PRN

## 2011-09-20 MED ORDER — CHLORDIAZEPOXIDE HCL 25 MG PO CAPS
25.0000 mg | ORAL_CAPSULE | ORAL | Status: AC
  Administered 2011-09-23 (×2): 25 mg via ORAL
  Filled 2011-09-20 (×3): qty 1

## 2011-09-20 MED ORDER — LOPERAMIDE HCL 2 MG PO CAPS: 2.0000 mg | ORAL_CAPSULE | ORAL | Status: DC | PRN

## 2011-09-20 MED ORDER — MAGNESIUM HYDROXIDE 400 MG/5ML PO SUSP: 30.0000 mL | Freq: Every day | ORAL | Status: DC | PRN

## 2011-09-20 MED ORDER — ADULT MULTIVITAMIN W/MINERALS CH
1.0000 | ORAL_TABLET | Freq: Every day | ORAL | Status: DC
  Administered 2011-09-20 – 2011-09-26 (×6): 1 via ORAL
  Filled 2011-09-20 (×7): qty 1

## 2011-09-20 MED ORDER — LOPERAMIDE HCL 2 MG PO CAPS
2.0000 mg | ORAL_CAPSULE | ORAL | Status: AC | PRN
Start: 2011-09-20 — End: 2011-09-23

## 2011-09-20 MED ORDER — VITAMIN B-1 100 MG PO TABS
100.0000 mg | ORAL_TABLET | Freq: Every day | ORAL | Status: DC
  Administered 2011-09-21 – 2011-09-26 (×6): 100 mg via ORAL
  Filled 2011-09-20 (×7): qty 1

## 2011-09-20 NOTE — Progress Notes (Signed)
   Demographic factors:    Current Mental Status:  Current Mental Status: Self-harm thoughts Loss Factors:  Loss Factors: Loss of significant relationship;Financial problems / change in socioeconomic status Historical Factors:  Historical Factors: Impulsivity Risk Reduction Factors:  Risk Reduction Factors: Religious beliefs about death;Positive therapeutic relationship  CLINICAL FACTORS:   Bipolar Disorder:   Depressive phase Depression:   Anhedonia Comorbid alcohol abuse/dependence Hopelessness Impulsivity Insomnia Severe Dysthymia Alcohol/Substance Abuse/Dependencies  COGNITIVE FEATURES THAT CONTRIBUTE TO RISK:  Closed-mindedness Loss of executive function Polarized thinking Thought constriction (tunnel vision)    SUICIDE RISK:   Severe:  Frequent, intense, and enduring suicidal ideation, specific plan, no subjective intent, but some objective markers of intent (i.e., choice of lethal method), the method is accessible, some limited preparatory behavior, evidence of impaired self-control, severe dysphoria/symptomatology, multiple risk factors present, and few if any protective factors, particularly a lack of social support.  Mental Status: General Appearance Kristina Huffman:  Casual, Disheveled and Guarded Eye Contact:  Fair Motor Behavior:  Restlestness Speech:  Normal Level of Consciousness:  Alert Mood:  Anxious, Depressed, Hopeless and Irritable Affect:  Constricted, Depressed and Tearful Anxiety Level:  Moderate Thought Process:  Coherent and Relevant Thought Content:  Rumination Perception:  Normal Judgment:  Fair Insight:  Present Cognition:  Orientation time, place and person Sleep:  Sleep Number of Hours: 6.75   PLAN OF CARE: Committed to [facility], adult unit for depression and suicidal attempt Discontinue Klonopin AND ambien due to overdose Monitor for withdrawal symptoms of benzo and alcohol Will be placed on Librium protocol for  detox     Kristina Huffman,JANARDHAHA R. 09/20/2011, 1:04 PM This si a48

## 2011-09-20 NOTE — Progress Notes (Signed)
Pt complains of anxiety and reports that she feels like she is going to crawl out of her skin. Writer gave patient prn dose of librium 25 mg and visteril 25 mg for withdrawal symptoms. Pt voiced frustratin with why she was not receiving her klonopin and [place], Clinical research associate encouraged pt to speak with NP/Dr on call that makes rounds on tomorrow. Pt denied having any pain, -si/hi/a/v hall. Safety maintained on unit will continue to monitor. Will follow up on prns given.

## 2011-09-20 NOTE — Progress Notes (Signed)
Patient ID: Kristina Huffman, female   DOB: 1961-08-30, 50 y.o.   MRN: [id number] Pt isolative to room this am with no group attendance.  Rates depression and hopelessness at 5. C/O agitation and chilling as physical symptoms. Reports low energy and poor appetite.  States she takes 2mg  of klonopin q 8 hours and "drinks occasionally" 1 bottle of wine which she did the night of the overdose. Very dissheveled in appearance.  States she wants "better coping skills" at d/c. Support offered and 15' checks cont for ssafety.

## 2011-09-20 NOTE — Progress Notes (Signed)
[facility] Group Notes:  (Counselor/Nursing/MHT/Case Management/Adjunct)  09/20/2011 5:08 PM  Type of Therapy:  Discharge Planning  Participation Level:  Did Not Attend      Kristina Huffman 09/20/2011, 5:08 PM

## 2011-09-20 NOTE — Progress Notes (Signed)
[facility] Group Notes:  (Counselor/Nursing/MHT/Case Management/Adjunct)  09/20/2011 5:57 PM   Type of Therapy:  Group Therapy  Participation Level:  Minimal  Participation Quality:  Appropriate  Affect:  Depressed  Cognitive:  Appropriate  Insight:  Limited  Engagement in Group:  Limited  Engagement in Therapy:  Limited  Modes of Intervention:  Clarification, Education, Problem-solving, Socialization and Support   Summary of Progress/Problems:  Pt participated in group by listening and answering questions appropriately.  Pt disclosed pertinent issues and expressed feelings of being ashamed of her behavior around her son who was home from college.  Pt explained that she was not suicidal.  She had consumed 3 bottles of Listerine.  Intervention effective.    Christen Butter 09/20/2011, 5:57 PM

## 2011-09-20 NOTE — Progress Notes (Signed)
Pt has spent this evening in the bed. Pt was given a prn dose of Ativan prior to shift change. Pt was sleeping at follow-up. Pt did not attend group, stating she was needing some rest. Pt was informed that I would be available to her as needed. Pt safety remains at this time with q58min checks.

## 2011-09-20 NOTE — Progress Notes (Signed)
[facility] Group Notes:  (Counselor/Nursing/MHT/Case Management/Adjunct)  09/20/2011 5:06 PM  Type of Therapy:  Discharge Planning  Participation Level:  Did Not Attend  Summary of Progress/Problems:  Pt was called out of group to meet with the MD.   Marni Griffon C 09/20/2011, 5:06 PM

## 2011-09-21 DIAGNOSIS — F1320 Sedative, hypnotic or anxiolytic dependence, uncomplicated: Secondary | ICD-10-CM | POA: Diagnosis present

## 2011-09-21 DIAGNOSIS — F3162 Bipolar disorder, current episode mixed, moderate: Secondary | ICD-10-CM | POA: Diagnosis present

## 2011-09-21 DIAGNOSIS — Z590 Homelessness: Secondary | ICD-10-CM

## 2011-09-21 DIAGNOSIS — F4329 Adjustment disorder with other symptoms: Secondary | ICD-10-CM | POA: Diagnosis present

## 2011-09-21 DIAGNOSIS — F4310 Post-traumatic stress disorder, unspecified: Secondary | ICD-10-CM | POA: Diagnosis present

## 2011-09-21 LAB — TSH: TSH: 4.15 u[IU]/mL (ref 0.350–4.500)

## 2011-09-21 MED ORDER — MAGNESIUM HYDROXIDE 400 MG/5ML PO SUSP
30.0000 mL | Freq: Once | ORAL | Status: AC
  Administered 2011-09-21: 30 mL via ORAL

## 2011-09-21 MED ORDER — LITHIUM CARBONATE 300 MG PO CAPS
300.0000 mg | ORAL_CAPSULE | Freq: Two times a day (BID) | ORAL | Status: DC
  Administered 2011-09-21 – 2011-09-24 (×6): 300 mg via ORAL
  Filled 2011-09-21 (×11): qty 1

## 2011-09-21 MED ORDER — VENLAFAXINE HCL 37.5 MG PO TABS
37.5000 mg | ORAL_TABLET | Freq: Every day | ORAL | Status: DC
  Administered 2011-09-22 – 2011-09-24 (×3): 37.5 mg via ORAL
  Filled 2011-09-21 (×5): qty 1

## 2011-09-21 NOTE — Progress Notes (Addendum)
Patient ID: Kristina Huffman, female   DOB: Feb 17, 1961, 50 y.o.   MRN: [id number] 09-21-11 nursing shift note: pt complained of constipation. rn got an order for mom and their was no change.  She had been nauseated prior to my shift but it was relieved by zofran prn. On her inventory sheet she wrote slept fair, appetite improving, energy low, attention poor with depression  And hopelessness at 6. Withdrawal symptoms have been tremor, agitation and chilling but the scheduled librium has been decreasing these symptoms. Denied any suicide ideation. Physical problems have been dizziness. She stated she did see a problem staying on medications after discharge. RN will continue to monitor and safety checks continue every 15 minutes.

## 2011-09-21 NOTE — Progress Notes (Signed)
[facility] Adult Inpatient Family/Significant Other Suicide Prevention Education  Suicide Prevention Education:  Contact Attempts: Kristina Huffman, Husband, [phone],  (name of family member/significant other) has been identified by the patient as the family member/significant other with whom the patient will be residing, and identified as the person(s) who will aid the patient in the event of a mental health crisis.  With written consent from the patient, one attempt and a message left to return the call, was made to provide suicide prevention education, prior to the patient's discharge.  We were unsuccessful in providing suicide prevention education.  A suicide education pamphlet was given to the patient to share with family/significant other.  Date and time of first attempt:  09/21/11 2:20pm   Kristina Huffman 09/21/2011, 2:23 PM

## 2011-09-21 NOTE — Discharge Planning (Signed)
Pt.did not attend this group.
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
## 2011-09-21 NOTE — Progress Notes (Signed)
Kristina Huffman is a 50 y.o. female [id number] [birth year]   Diagnosis:  Axis I: Major Depression, Recurrent severe  Vital Signs:Blood pressure 122/89, pulse 73, temperature 96.6 F (35.9 C), temperature source Oral, resp. rate 24, height 5\' 7"  (1.702 m), weight 215 lb (97.523 kg).  Subjective/Objective:  Pt. Notes that she is feeling better today. Also asks about restarting her Klonopin, and increasing her prozac, and ambien for sleep. Her report from [place] Controlled substance registry is shown to her.  She is very open with me and direct.  She notes that she has been in detox prior to this visit at a 5 day facility.    She also states she has a history of Bipolar disorder and that lithium worked well for her in the past.  She has also used depakote (didn't work as well) Geodon(too sedating) and prozac makes her extremely anxious.  The combination of Lithium and Effexor worked very well for her but she stopped it.  Her life has been on a downward spiral prior to her son's death and only worsened since then.  They are homeless and her 4 yr. Old daughter is with a cousin.  Her husband can not find work.  Mental Status: Pt is up and active in the unit milieu. General Appearance: Disheveled Behavior: Cooperative, and informative Eye Contact:  Good Motor Behavior:  Restlestness Speech:  Regular rate and rhythm, normal volume, coherent and goal directed. Level of Consciousness:  Alert Mood:  Depressed and Hopeless Affect:  Tearful Anxiety Level:  Moderate Thought Process:  Linear and organized. Thought Content:  No auditory or visual hallucinations. No evidence of psychosis. Perception:  Normal Judgment:  Poor Insight:  Absent Cognition:  Orientation time, place and person Sleep:  Number of Hours: 6.5  Appetite: none, improving, as usual, improved  Lab Results:   Medications Scheduled:     . chlordiazePOXIDE  25 mg Oral QID   Followed by  . chlordiazePOXIDE  25 mg Oral TID   Followed  by  . chlordiazePOXIDE  25 mg Oral BH-qamhs   Followed by  . chlordiazePOXIDE  25 mg Oral Daily  . chlordiazePOXIDE  50 mg Oral Once  . FLUoxetine  20 mg Oral Daily  . mulitivitamin with minerals  1 tablet Oral Daily  . thiamine  100 mg Intramuscular Once  . thiamine  100 mg Oral Daily  . DISCONTD: FLUoxetine  20 mg Oral Daily  . DISCONTD: mulitivitamin with minerals  1 tablet Oral Daily  . DISCONTD: mulitivitamin with minerals  1 tablet Oral Daily  . DISCONTD: mulitivitamin with minerals  1 tablet Oral Daily  . DISCONTD: thiamine  100 mg Intramuscular Once  . DISCONTD: thiamine  100 mg Intramuscular Once  . DISCONTD: thiamine  100 mg Intramuscular Once  . DISCONTD: thiamine  100 mg Oral Daily  . DISCONTD: thiamine  100 mg Oral Daily  . DISCONTD: thiamine  100 mg Oral Daily     PRN Meds chlordiazePOXIDE, hydrOXYzine, loperamide, ondansetron, DISCONTD: acetaminophen,Assessment/Plan: Pt. Will be continued on Librium protocol to avoid seizures with withdrawal.  She has had one in the past.  Lithium will be started and prozac will be discontinued. Effexor or generic equivalent will also be started as well.    Continue current plan of care with no changes at this time.  Anticipated D/C is 3-5 days.  Rona Ravens. Darin Arndt [facility] 09/21/2011

## 2011-09-21 NOTE — Progress Notes (Signed)
Pt came to medication window requesting a prn of Librium and visteril which was given for anxiety, pt c/o of constipation and was offered prune just which she accepted. Pt voiced feeling a little bit better but her appearance was disheveled and depressed. Pt currently denies having pain -si/hi/a/v hall. Safety maintained on unit will continue to monitor.

## 2011-09-22 DIAGNOSIS — Z23 Encounter for immunization: Secondary | ICD-10-CM

## 2011-09-22 MED ORDER — INFLUENZA VIRUS VACC SPLIT PF IM SUSP
0.5000 mL | INTRAMUSCULAR | Status: AC
  Administered 2011-09-22: 0.5 mL via INTRAMUSCULAR

## 2011-09-22 NOTE — Progress Notes (Signed)
Patient ID: Kristina Huffman, female   DOB: [birth year], 50 y.o.   MRN: [id number] Pt reports fair sleep and improving appetite.  She says her energy level is low.  She rates her depression a 5 and her helplessness a 5.  She denies any thoughts of self harm.  She is currently constipated and plans to talk with MD about this.  She says she doesn't see any problem staying on her meds after d/c.

## 2011-09-22 NOTE — Treatment Plan (Signed)
Interdisciplinary Treatment Plan Update (Adult)  Date: 09/22/2011  Time Reviewed: 10:51 AM   Progress in Treatment: Attending groups: Yes Participating in groups: Yes Taking medication as prescribed: Yes Tolerating medication: Yes   Family/Significant othe contact made: Yes  Kristina Huffman give permission to call husband   Patient understands diagnosis:  Yes  As evidenced by asking for help with depression, and dealing with her grief based in reality rather than in the fog of benzos mixed with alcohol Discussing patient identified problems/goals with staff:  Yes  See below Medical problems stabilized or resolved:  Yes Denies suicidal/homicidal ideation: Yes Issues/concerns per patient self-inventory:  Yes   Withdrawal symptoms,  Upcoming court date Other:  New problem(s) identified: N/A  Reason for Continuation of Hospitalization: Depression Medication stabilization Suicidal ideation Withdrawal symptoms  Interventions implemented related to continuation of hospitalization:Librium detox protocol  Encourage group attendance and participation   Additional comments:  Estimated length of stay:3-4 days  Discharge Plan:Return home  Follow up out pt.  New goal(s): N/A  Review of initial/current patient goals per problem list:   1.  Goal(s):Help me come to grips with my loss  Met:  No  Target date:1/2  As evidenced [id number] with meds, therapeutic milieu here and preparing for outpt therapy  2.  Goal (s):Prescribe meds for me that help with my depression  Met:  No  Target date:1/2  As evidenced [id number] assessment and trial  3.  Goal(s):Stop taking benzos that just knock me out rather than me dealing with reality  Met:  No  Target date:1/2  As evidenced [id number] detox from benzos  4.  Goal(s):  Met:  No  Target date:  As evidenced by:  Attendees: Patient:  Kristina Huffman 09/22/2011 10:51 AM  Family:     Physician:  Shelda Jakes 09/22/2011 10:51  AM   Nursing:  Quintella Reichert  09/22/2011 10:51 AM   Case Manager:  Richelle Ito, LCSW 09/22/2011 10:51 AM   Counselor:  Ronda Fairly, LCSWA 09/22/2011 10:51 AM   Other:     Other:     Other:     Other:      Scribe for Treatment Team:   Ida Rogue, 09/22/2011 10:51 AM

## 2011-09-22 NOTE — Progress Notes (Signed)
Kristina Huffman is a 50 y.o. female [id number] 1961-06-29   Diagnosis:  Benzodiazepine dependence                      Bipolar disorder  Vital Signs:Blood pressure 114/80, pulse 77, temperature 97.6 F (36.4 C), temperature source Oral, resp. rate 18, height 5\' 7"  (1.702 m), weight 215 lb (97.523 kg), SpO2 97.00%.  Subjective/Objective:  Kristina Huffman met with the treatment team this morning and began to set goals for her care upon discharge.  She was restarted yesterday on her Bipolar medication and states she feels a bit ,"loopy" this morning. She is much clearer of speech and much more coherent.  Mental Status: Pt is up and active in the unit milieu. General Appearance: Casual Behavior: Cooperative, Eye Contact:  Good Motor Behavior:  Normal Speech:  Regular rate and rhythm, normal volume, coherent and goal directed. Level of Consciousness:  Alert Mood:  Anxious and Depressed Affect:  Appropriate Anxiety Level:  Moderate Thought Process:  Linear and organized. Thought Content:  No auditory or visual hallucinations. No evidence of psychosis. Perception:  Normal Judgment:  Good Insight:  Present Cognition:  Much improved in the last 24 hours.  Pt. Is more focused and able to complete a sentence. Sleep:  Number of Hours: 5.5  Appetite: improving.  Lab Results: None pending.     Medications Scheduled: Flu shot is ordered and given this morning.    . chlordiazePOXIDE  25 mg Oral QID   Followed by  . chlordiazePOXIDE  25 mg Oral TID   Followed by  . chlordiazePOXIDE  25 mg Oral BH-qamhs   Followed by  . chlordiazePOXIDE  25 mg Oral Daily  . influenza  inactive virus vaccine  0.5 mL Intramuscular Tomorrow-1000  . lithium carbonate  300 mg Oral BID WC  . magnesium hydroxide  30 mL Oral Once  . mulitivitamin with minerals  1 tablet Oral Daily  . thiamine  100 mg Intramuscular Once  . thiamine  100 mg Oral Daily  . venlafaxine  37.5 mg Oral QAC breakfast  PRN  Meds chlordiazePOXIDE, hydrOXYzine, loperamide, ondansetron  Assessment/Plan: Patient is to begin exploring options for further treatment upon discharge from [facility]. Continue current plan of care with no changes at this time.  Rona Ravens. Leone Mobley [facility] 09/22/2011

## 2011-09-22 NOTE — Discharge Planning (Signed)
Kristina Huffman attended AM group.  Shared that she had lost son recently, and is here for help with grief and loss .  Admitted to taking overdose of medications and drinking wine in a suicide attempt.  States she is still depressed but not hopeless.  She and husband recently moved here from [place].  It has been a tough transition.  Living in hotel, financial stressors.  Is in transition and unclear what the future holds.  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  Plans to return home, follow up outpt.

## 2011-09-22 NOTE — Progress Notes (Signed)
[facility] Group Notes:  (Counselor/Nursing/MHT/Case Management/Adjunct)  09/22/2011 3:49 PM   Type of Therapy:  Processing Group at 11:00 am  Participation Level:  Appropriate  Participation Quality:  Sharing and attentive unless attention with some others  Affect: Appropriate, somewhat depressed  Cognitive:  Oriented  Insight:  Limited  Engagement in Group:  Good  Engagement in Therapy:  Limited  Modes of Intervention:  Socialization, exploration and support in addition to limit setting  Summary of Progress/Problems: Adalia shared multiple times and when to facilitate her turned focus to others in group she left. Clariza was able to share a good memory of her son from 2012 as others for past to share a good memory from this past year. Patient seems to have a good presentation in understanding for group work and heavily participated although sharing little other than reasons one cannot recover.  [facility] Group Notes:  (Counselor/Nursing/MHT/Case Management/Adjunct)  09/22/2011 3:49 PM   Type of Therapy: Group at 1:15 pm; [facility]AG Presentration  Participation Level:  Did Not Attend    Ronda Fairly, LCSWA 09/22/2011 3:49 PM

## 2011-09-23 DIAGNOSIS — F332 Major depressive disorder, recurrent severe without psychotic features: Principal | ICD-10-CM

## 2011-09-23 MED ORDER — CHLORDIAZEPOXIDE HCL 25 MG PO CAPS
25.0000 mg | ORAL_CAPSULE | Freq: Once | ORAL | Status: AC
  Administered 2011-09-23: 25 mg via ORAL

## 2011-09-23 NOTE — Progress Notes (Signed)
Pt reports minimal withdrawal symptoms at this time.  She attended evening group and has been appropriate this evening.  She had no scheduled meds tonight, but requested meds for anxiety at bedtime.  Pt denies SI/HI.  Safety maintained with q15 minute checks.

## 2011-09-23 NOTE — Progress Notes (Signed)
Patient ID: Kristina Huffman, female   DOB: December 19, 1960, 51 y.o.   MRN: [id number]  PT. DENIES SI/HI/HA BUT MAINTAINS A SAD/FLAT AFFECT.  SHE IS FRIENDLY TO STAFF BUT ONLY TALKS WHEN SPOKEN TO.  SHE IS GETTING ALONG WELL WITH PEERS.  PT. DOES NOT SMILE AND LOOKS DOWN MOST OF TIME BUT MAKES NO PHYS. COMPLAINTS AT THIS TIME

## 2011-09-23 NOTE — Progress Notes (Signed)
[facility] MD Progress Note  09/23/2011 12:56 PM  Diagnosis:   Axis I: See current hospital problem list Axis II: Deferred Axis III:  Past Medical History  Diagnosis Date  . Depression   . COPD (chronic obstructive pulmonary disease)   . Asthma    Axis IV: unchanged Axis V: 41-50 serious symptoms  ADL's:  Intact  Sleep:  Yes,  AEB:  Appetite:  Yes,  AEB:  Suicidal Ideation:   Plan:  No  Intent:  No  Means:  No  Homicidal Ideation:   Plan:  No  Intent:  No  Means:  No  AEB (as evidenced by):Fraidy c/o feeling tired all the time, which is a side effect of the librium.  Denies any withdrawal symptoms.  Mental Status: A&O  General Appearance Kristina Huffman:  Disheveled Eye Contact:  Minimal Motor Behavior:  Normal Speech:  Normal Level of Consciousness:  Alert Mood:  Anxious and Depressed Affect:  Blunt Anxiety Level:  Moderate Thought Process:  Coherent Thought Content:  WNL Perception:  Normal Judgment:  Fair Insight:  Present Cognition:  Orientation time, place and person Memory Remote Concentration Yes Sleep:  Number of Hours: 5.5   Vital Signs:Blood pressure 110/79, pulse 66, temperature 97.3 F (36.3 C), temperature source Oral, resp. rate 17, height 5\' 7"  (1.702 m), weight 97.523 kg (215 lb), SpO2 97.00%.  Lab Results: No results found for this or any previous visit (from the past 48 hour(s)).  Physical Findings: AIMS:  , ,  ,  ,    CIWA:  CIWA-Ar Total: 3  COWS:     Treatment Plan Summary: Daily contact with patient to assess and evaluate symptoms and progress in treatment Medication management  Plan: Complete medical detox.  Increase Effexor when appropriate.  Check Lithium level in 3-4 days.  Continue plans for possible residential treatment. Kristina Huffman 09/23/2011, 12:56 PM

## 2011-09-23 NOTE — Progress Notes (Signed)
Patient ID: Kristina Huffman, female   DOB: 1961-05-28, 51 y.o.   MRN: [id number] Pt reports fair sleep and she rates her depression and hopelessness a 6.  She reports that she feels very tired most of the time.  She woke up feeling nauseated and wanted to wait to take meds until she felt better and more awake.  She says  that finances may be an issue  in staying on her meds after D/C.  Pt is attending groups.  Pt said this am that she is still constipated, but said she had a bm yesterday and on her self inventory she reported having some diarrhea.

## 2011-09-23 NOTE — Progress Notes (Signed)
[facility] Group Notes:  (Counselor/Nursing/MHT/Case Management/Adjunct)  09/23/2011 1:09 PM  Type of Therapy:  Group Processing at 11:00AM  Participation Level:  Did Not Attend   Clide Dales 09/23/2011, 1:09 PM

## 2011-09-23 NOTE — Progress Notes (Signed)
Pt attended discharge planning group and actively participated.  Pt presents with calm mood and affect . Pt denies depression, anxiety and SI.  Pt states she lives in [place] but is currently homeless.  Pt states she has a 49 year old daughter who is currently living with a cousin in [place].  Pt states she is very interested in the [facility] program.  SW provided pt with a [facility] application and SW will make the referral to [facility] tomorrow, if pt is appropriate for the program.   Reyes Ivan, LCSWA 09/23/2011  10:27 AM

## 2011-09-24 MED ORDER — TUBERCULIN PPD 5 UNIT/0.1ML ID SOLN
5.0000 [IU] | Freq: Once | INTRADERMAL | Status: AC
  Administered 2011-09-24: 5 [IU] via INTRADERMAL
  Filled 2011-09-24: qty 0.1

## 2011-09-24 MED ORDER — VENLAFAXINE HCL 50 MG PO TABS
50.0000 mg | ORAL_TABLET | Freq: Every day | ORAL | Status: DC
  Administered 2011-09-25 – 2011-09-26 (×3): 50 mg via ORAL
  Filled 2011-09-24 (×3): qty 1

## 2011-09-24 MED ORDER — LITHIUM CARBONATE 300 MG PO CAPS
300.0000 mg | ORAL_CAPSULE | Freq: Every day | ORAL | Status: DC
  Administered 2011-09-25 – 2011-09-26 (×2): 300 mg via ORAL
  Filled 2011-09-24 (×3): qty 1

## 2011-09-24 MED ORDER — LITHIUM CARBONATE 300 MG PO CAPS
600.0000 mg | ORAL_CAPSULE | Freq: Every day | ORAL | Status: DC
  Administered 2011-09-24 – 2011-09-25 (×2): 600 mg via ORAL
  Filled 2011-09-24 (×2): qty 2

## 2011-09-24 NOTE — Progress Notes (Signed)
Patient ID: Kristina Huffman, female   DOB: June 17, 1961, 51 y.o.   MRN: [id number] Patient was pleasant and cooperative during the assessment. Pt complained of nausea and feeling on edge. Stated she was informed by her Dr to try no meds then inform of how she feels in the morning.  Support and encouragement was offered.

## 2011-09-24 NOTE — Discharge Planning (Signed)
Today in AM group Kristina Huffman reveals she wants to go to [facility].  Has filled out the application that [facility] gave her yesterday.  When asked why, she details that in order to be a decent parent to her daughter, she needs to get off of substances and live clean and sober, and she needs structure to do that.  Information faxed to [facility] at [facility].  Interview for 8:30 tomorrow.

## 2011-09-24 NOTE — Progress Notes (Signed)
Lying quietly in bed with eyes closed. Exhibiting normal sleep behavior.  Q 15 minute checks in progress to maintain safety.

## 2011-09-24 NOTE — Progress Notes (Signed)
Initial visit in response to Spiritual care consult.   Pt was receptive to chaplain presence, sitting on bed during interview, was talkative and oriented, did not often make eye contact, tearful at times.    Pt experiencing complicated and disenfranchised grief over loss of 49 y/o son Kristina Huffman).  Pt reports son had history of depression and suicide attempts.  Spoke with chaplain about history of caring for son - including waking multiple times in the night to check on him and take his pulse to ensure he was still alive.  Pt detailed finding her son after his overdose.    Pt spoke with chaplain about not having space to grieve loss due to financial and family concerns.  Also spoke about her own behavioral health and her feeling that medication has not allowed her to be present with her feelings of loss.    Chaplain provided support, grief and narrative work and talked with pt about grief as a process.    Conversation was cut short due to telephone call for pt.  Pt indicated she would like to continue conversation.  Will continue to follow up with pt.    If needs arise please page      09/24/11 1700  Clinical Encounter Type  Visited With Patient  Visit Type Initial;Spiritual support;Psychological support;Social support;Behavioral Health  Referral From Nurse  Consult/Referral To Chaplain  Spiritual Encounters  Spiritual Needs Grief support;Emotional  Stress Factors  Patient Stress Factors Major life changes;Other (Comment);Loss;Financial concerns;Family relationships (Grief and bereavement)  Family Stress Factors Loss;Financial concerns

## 2011-09-24 NOTE — Progress Notes (Signed)
[facility] Group Notes:  (Counselor/Nursing/MHT/Case Management/Adjunct)  09/24/2011 6:29 PM   Type of Therapy:  Processing Group at 11:00 am  Participation Level:  Active  Participation Quality:  Sharing, somewhat mobilizing  Affect:  Appropriate  Cognitive:  Appropriate  Insight: Good  Engagement in Group:  Good  Engagement in Therapy:  Limited  Modes of Intervention:  Exploration support and redirection  Summary of Progress/Problems: Sindhu shared difficulty with processing grief over loss of her son shared that addiction was a way to avoid. Yulonda alluded to events that she detached from in childhood and she is connecting behavior of avoiding and denial of her own grief.  [facility] Group Notes:  (Counselor/Nursing/MHT/Case Management/Adjunct)  09/24/2011 6:29 PM   Type of Therapy:  Counseling Group at 1:15 pm  Participation Level:  Active  Participation Quality:  Appropriate somewhat monopolizing  Affect:  Appropriate  Cognitive:  Appropriate  Insight:  Good  Engagement in Group:  Good  Engagement in Therapy:  Good  Modes of Intervention:  Exploration and socialization and support  Summary of Progress/Problems:  Brisa continues to share almost to the point of mobilizing group time it responds to redirection well. Patient shared that her addiction levels were so high that any bit of reality was cause for crisis and her and her husband moved to [place] seems to be allowing her some space and distance to deal with the reality of the loss of her son.  Ronda Fairly, [place] 09/24/2011 6:29 PM

## 2011-09-24 NOTE — Progress Notes (Signed)
Pt has been complaining of not being able to sleep. Pt would like different night medication. Pt was redirected to talk with the doctor. Pt was offered support and encouragement. Pt does attend groups and interacts well with peers and staff. Pt denies SI/HI. Pt receptive to care and safety maintained on unit.

## 2011-09-25 NOTE — Treatment Plan (Signed)
Interdisciplinary Treatment Plan Update (Adult)  Date: 09/25/2011  Time Reviewed: 12:19 PM   Progress in Treatment: Attending groups: Yes Participating in groups: Yes Taking medication as prescribed: Yes Tolerating medication: Yes   Family/Significant othe contact made:   Patient understands diagnosis:  Yes Discussing patient identified problems/goals with staff:  Yes Medical problems stabilized or resolved:  Yes Denies suicidal/homicidal ideation: Yes Issues/concerns per patient self-inventory:  None noted Other:  New problem(s) identified: N/A  Reason for Continuation of Hospitalization: Medication stabilization  Interventions implemented related to continuation of hospitalization: Lithium and effexor increased today for mood stabilization  Group therapy  Additional comments:  Estimated length of stay:d/c tomorrow  Discharge Plan:Transfer to [facility]  New goal(s): N/A  Review of initial/current patient goals per problem list:   1.  Goal(s):Safely detos from benzos  Met:  Yes  Target date:  As evidenced by:   2.  Goal (s):Medication trial for depression  Met:  Yes  Target date:  As evidenced by:  3.  Goal(s):  Met:  Yes  Target date:  As evidenced by:   4.  Goal(s):Help me come to grips with my loss  Met:  Yes  Target date: As evidenced AO:[id number] is now taking things one day at a time, focusing on her sobriety, and will pursue groups thru[facility] hospice for bereavement while at [facility] Attendees: Patient:  Kristina Huffman 09/25/2011 12:19 AM  Family:     Physician:  Lupe Carney 09/25/2011 12:19 PM   Nursing:  Omelia Blackwater  09/25/2011 12:19 PM   Case Manager:  Richelle Ito, LCSW 09/25/2011 12:19 PM   Counselor:  Ronda Fairly, LCSWA 09/25/2011 12:19 PM   Other:  Shelda Jakes 09/25/2011 12:19 AM  Other:     Other:     Other:      Scribe for Treatment Team:   Ida Rogue, 09/25/2011 12:19 PM

## 2011-09-25 NOTE — Progress Notes (Signed)
[facility] Family/Significant Other Suicide Prevention Education  Suicide Prevention Education:  Education Completed; husband, Starleen Blue, husband, at [phone] has been identified by the patient as the family member/significant other with whom the patient will be residing, and identified as the person(s) who will aid the patient in the event of a mental health crisis (suicidal ideations/suicide attempt).  With written consent from the patient, the family member/significant other has been provided the following suicide prevention education, prior to the and/or following the discharge of the patient.  The suicide prevention education provided includes the following:  Suicide risk factors  Suicide prevention and interventions  National Suicide Hotline telephone number  [facility] assessment telephone number  [facility] Emergency Assistance 911  [facility] and/or Residential Mobile Crisis Unit telephone number  Request made of family/significant other to:  Remove weapons (e.g., guns, rifles, knives), all items previously/currently identified as safety concern.    Remove drugs/medications (over-the-counter, prescriptions, illicit drugs), all items previously/currently identified as a safety concern.  The family member/significant other verbalizes understanding of the suicide prevention education information provided.  The family member/significant other agrees to remove the items of safety concern listed above.  Writer spoke with Starleen Blue in lobby of [facility]. Husband states they have no firearms and he has disposed of all meds and alcohol.   Clide Dales 09/25/2011, 5:30 PM

## 2011-09-25 NOTE — Progress Notes (Signed)
Patient ID: Kristina Huffman, female   DOB: 1961-02-10, 51 y.o.   MRN: [id number]  S/O: Pt seen and evaluated.  Chart reviewed.  Pt in significant grief s/p the death of her son.  Relapsed s/p his death.  Sig depression noted with reported history of BPAD.  Od'd on "pills", likely combination of Klonopin, Ambien and Prozac. Said that the combination of lithium and Effexor was the most beneficial for her in the past.  Discussed the stages of grief at length.  Restarted meds s/p pts approval and med ed.  Mood reported as "better".  She denied any current thoughts of self injurious behavior, suicidal ideation or homicidal ideation. There were no auditory or visual hallucinations, paranoia, delusional thought processes, or mania noted.  Thought process was linear and goal directed.  No psychomotor agitation or retardation was noted. Speech was normal rate, tone and volume. Eye contact was good. Judgment and insight are limited.  Patient has been up and engaged on the unit.  No safety concerns reported from team.  A/P: Mood Disorder NOS; Bereavement; r/o PTSD; r/o BPAD  Restarted meds.  Medication education completed.  Pros, cons, risks, potential side effects and benefits (including no treatment) were discussed with pt.  Pt agreeable with the plan.  See orders.  Discussed with team.

## 2011-09-25 NOTE — Progress Notes (Signed)
Recreation Therapy Group Note   Date: 09/25/2011   Time: 1415  Group Topic/Focus: The focus of this group is on enhancing the patient's understanding of leisure, barriers to leisure, and the importance of engaging in positive leisure activities upon discharge for improved total health.   Participation Level:  Active  Participation Quality:  Appropriate and Sharing  Affect:  Depressed  Cognitive:  Oriented   Additional Comments: Patient very insightful, talked about what her typical day looked like prior to admission. Patient says she can't remember any hobbies she has had since she began using when she was a teenager, but was able to identify some things she would like to try and how she wants to do more with her daughter.Marland Kitchen  [name]  09/25/2011 3:51 PM

## 2011-09-25 NOTE — Progress Notes (Signed)
Pt is very proactive and has has her interview with [facility] and it looks as though she will be going tomorrow. PT continues to try in get in contact with her husband as she needs clothes from home because she can not go home and then to [facility]. Pt is attending groups. Pt is very insightful and hopes to find a grief support group when she gets out. Pt is receptive to treatment and safety maintained on the unit.

## 2011-09-25 NOTE — Progress Notes (Signed)
[facility] Group Notes:  (Counselor/Nursing/MHT/Case Management/Adjunct)  09/25/2011 5:35 PM   Type of Therapy:  Processing Group at 11:00 am  Participation Level:  Active  Participation Quality:  Appropriate  Affect:   Appropriate  Cognitive:   Appropriate  Insight:  Limited  Engagement in Group:  Good  Engagement in Therapy:  Good  Modes of Intervention:  Exploation and socialization  Summary of Progress/Problems: Nyasha shared that basic needs in life are foundation stones for being able to process feelings. Pt is currently homeless and has been avoiding grief process for many months through substance use and realizes how much easier it would have been to start the process then vs now as she has no housing yet is grateful for opportunity to go to treatment facility which provides housing in which she can also receive IOP.  [facility] Group Notes:  (Counselor/Nursing/MHT/Case Management/Adjunct)  09/25/2011 5:35 PM   Type of Therapy:  Counseling Group at 1:15 pm  Participation Level:  Active   Participation Quality:  Appropriate  Affect:  Appropriate  Cognitive:  Appropriate  Insight:  Improving  Engagement in Group:  Good  Engagement in Therapy:  Good  Modes of Intervention:  Activity, socialization and support  Summary of Progress/Problems:  Patient participated in group activity and choose two photos to share with group. Pt choose photo of someone who appeared to be overwhelmed and related difficulty in healthy functioning when feelings overwhelm her. Pt also choose photo of a happy family to depict when things are going well in her life; showing a family with what she describes as "real love and light in their eyes"  Ronda Fairly, LCSWA 09/25/2011 5:35 PM

## 2011-09-26 MED ORDER — LITHIUM CARBONATE 300 MG PO CAPS: 300.0000 mg | ORAL_CAPSULE | Freq: Every day | ORAL | Status: DC

## 2011-09-26 MED ORDER — VENLAFAXINE HCL 25 MG PO TABS
50.0000 mg | ORAL_TABLET | Freq: Every day | ORAL | Status: DC
  Filled 2011-09-26: qty 14

## 2011-09-26 MED ORDER — LITHIUM CARBONATE 300 MG PO CAPS: 900.0000 mg | ORAL_CAPSULE | ORAL | Status: DC

## 2011-09-26 MED ORDER — VENLAFAXINE HCL 50 MG PO TABS: 50.0000 mg | ORAL_TABLET | Freq: Every day | ORAL | Status: DC

## 2011-09-26 NOTE — Progress Notes (Signed)
[facility] Group Notes:  (Counselor/Nursing/MHT/Case Management/Adjunct)  09/26/2011 12:31 PM  Type of Therapy:  Processing Group at 11:00AM  Participation Level:  Did Not Attend; Writer was informed by other patients that pt's husband was visiting in room. Checked with staff and visit had been okayed by [employer] nurse.    Clide Dales 09/26/2011, 12:31 PM

## 2011-09-26 NOTE — Progress Notes (Signed)
[facility] Case Management Discharge Plan:  Will you be returning to the same living situation after discharge: No. Would you like a referral for services when you are discharged:Yes,  [facility] Do you have access to transportation at discharge:Yes,  city bus Do you have the ability to pay for your medications:Yes,  [facility]  Interagency Information:     Patient to Follow up at:  Follow-up Information    Follow up with [facility] on 09/26/2011. (Will pick you up at 12:00)    Contact information:   [address]  [place]  [address]  [phone]          Patient denies SI/HI:   Yes,  yes    Safety Planning and Suicide Prevention discussed:  Yes,  yes  Barrier to discharge identified:No.  Summary and Recommendations:   Kristina Huffman 09/26/2011, 11:41 AM

## 2011-09-26 NOTE — Progress Notes (Signed)
Sleeping at long intervals.  No physical complaints voiced and no behavioral problems reported or observed.  Q 15 min. Safety checks conducted to maintain safety.

## 2011-09-26 NOTE — Progress Notes (Signed)
Suicide Risk Assessment  Discharge Assessment    Demographic factors: See chart.  Current Mental Status: Patient seen and evaluated today and in treatment team on 09/26/11. Chart reviewed. Patient stated that her mood was "ok/much better". Her affect was mood congruent and euthymic. She denied any current thoughts of self injurious behavior, suicidal ideation or homicidal ideation. She denied any significant depressive signs or symptoms at this time and has much improved.  No lability or sig crying spells. There were no auditory or visual hallucinations, paranoia, delusional thought processes, or mania noted.  Thought process was linear and goal directed.  No psychomotor agitation or retardation was noted. Speech was normal rate, tone and volume. Eye contact was good. Judgment and insight are fair.  Patient has been up and engaged on the unit.  No acute safety concerns reported from team.  Loss Factors: Loss of son; Financial problems / change in socioeconomic status   Historical Factors: Impulsivity; SA   Risk Reduction Factors: Religious beliefs about death; Positive therapeutic relationship; improved coping skills   CLINICAL FACTORS:  Patient Active Problem List  Diagnoses  . Benzodiazepine dependence  . Bipolar 1 disorder, mixed, moderate  . Complicated bereavement  . PTSD (post-traumatic stress disorder)  . Homeless    COGNITIVE FEATURES THAT CONTRIBUTE TO RISK: limited insight.  Meds:   . lithium carbonate  300 mg Oral Q0600  . lithium carbonate  600 mg Oral QHS  . mulitivitamin with minerals  1 tablet Oral Daily  . thiamine  100 mg Intramuscular Once  . thiamine  100 mg Oral Daily  . venlafaxine  50 mg Oral QAC breakfast  . DISCONTD: lithium carbonate  300 mg Oral Q0600  . DISCONTD: venlafaxine  50 mg Oral QAC breakfast    SUICIDE RISK: Pt viewed as a chronic increased risk of harm to self in light of her past hx and risk factors.  No acute safety concerns since on the unit.   Pt stable for discharge to [facility] at this time.  PLAN OF CARE: Pt seen and evaluated.  Chart reviewed.  Pt stable for and requesting discharge. Pt contracting for safety and does not currently meet [facility] involuntary commitment criteria for continued hospitalization.  Mental health treatment, medication management and continued sobriety will mitigate against the increased risk of harm to self and/or others.  Discussed the importance of recovery further with pt, as well as, tools to move forward in a healthy & safe manner.  Pt agreeable with the plan.  Discussed with the team.  Please see orders, follow up plans per team and full discharge summary to be completed by physician extender.   Lupe Carney 09/26/2011, 12:57 PM

## 2011-09-26 NOTE — Progress Notes (Signed)
Patient ID: Kristina Huffman, female   DOB: Aug 16, 1961, 51 y.o.   MRN: [id number]   Patient at baseline of functioning. Obtained all belongings, medications, prescriptions called into [facility]. Transportation from [facility] here to pick patient up at this time. Denies SI/HI.

## 2011-09-29 NOTE — Progress Notes (Signed)
Patient Discharge Instructions:  Admission Note Faxed,  09/29/2011 After Visit Summary Faxed,  09/29/2011 Faxed to the [facility] Care provider:  09/29/2011 Facesheet faxed 09/29/2011  Faxed to [facility] @ [phone]  Wandra Scot, 09/29/2011, 3:13 PM

## 2011-10-08 NOTE — Discharge Summary (Signed)
  Kristina Huffman 12-19-1960 50 y.o. [id number]    Date of Admission:  08/30/2011 Date of Discharge:  09/26/2011 Diagnosis on Discharge:   AXIS I Bipolar, mixed, Post Traumatic Stress Disorder and Benzodiazepine dependence  AXIS II Deferred  AXIS III Past Medical History  Diagnosis Date  . Depressed      AXIS IV housing problems, occupational problems and problems with primary support group  AXIS V 41-50 serious symptoms    HPI: Kristina Huffman was admitted from the ED where she presented with benzodiazepine dependence and symtpoms of bipolar disorder with mixed mania and depression. Hospital Course:      The duration of Kristina Huffman"s stay at [facility] was unremarkable.      The patient was seen and evaluated by the Treatment team consisting of Psychiatrist, PAC, RN, Case Manager, and Therapist for evaluation and treatment plan with goal of stabilization upon discharge. The patient's physical and mental health problems were identified and treated appropriately.      Multiple modalities of treatment were used including medication, individual and group therapies, unit programming, AA/NA, improved nutrition, physical activity, and family sessions as needed.     The symptoms of alcohol/substance abuse withdrawal were monitored daily by serial clinical withdrawal scores. Improvement was demonstrated by declining CIWA/COWS numbers, improving vital signs, increased cognition, and improvement in mood, sleep, appetite as well as a reduction in psychosocial symptoms.       The patient was evaluated and found to be stable enough for discharge and was discharged to the [facility] program  per the initial plan of treatment.  MSE: For MSE please see SRA completed by MD. Labs:None pending  Level of Care:  Long-term IP psych.  lithium carbonate 300 MG capsule for mood stabilization  venlafaxine (EFFEXOR) 50 MG tablet for depression and anxiety   Is patient on multiple antipsychotic therapies at discharge:  No    Has Patient  had three or more failed trials of antipsychotic monotherapy by history:  No  Follow-up recommendations:  Other:  Keep all scheduled appointments. Discharge destination:  Other:  [facility] program    Kristina Ravens. Samanthamarie Huffman PAC DR.Alyson Kuroski-MazzieMD 09/26/2011

## 2011-11-11 ENCOUNTER — Inpatient Hospital Stay (HOSPITAL_COMMUNITY)
Admission: RE | Admit: 2011-11-11 | Discharge: 2011-11-18 | DRG: 897 | Disposition: A | Discharge status: Home / Self Care | Payer: PRIVATE HEALTH INSURANCE | Attending: Psychiatry | Admitting: Psychiatry

## 2011-11-11 DIAGNOSIS — F1320 Sedative, hypnotic or anxiolytic dependence, uncomplicated: Secondary | ICD-10-CM

## 2011-11-11 DIAGNOSIS — Z79899 Other long term (current) drug therapy: Secondary | ICD-10-CM

## 2011-11-11 DIAGNOSIS — J449 Chronic obstructive pulmonary disease, unspecified: Secondary | ICD-10-CM

## 2011-11-11 DIAGNOSIS — F3160 Bipolar disorder, current episode mixed, unspecified: Secondary | ICD-10-CM

## 2011-11-11 DIAGNOSIS — J4489 Other specified chronic obstructive pulmonary disease: Secondary | ICD-10-CM

## 2011-11-11 DIAGNOSIS — F3162 Bipolar disorder, current episode mixed, moderate: Secondary | ICD-10-CM

## 2011-11-11 DIAGNOSIS — F1020 Alcohol dependence, uncomplicated: Principal | ICD-10-CM | POA: Diagnosis present

## 2011-11-11 DIAGNOSIS — J069 Acute upper respiratory infection, unspecified: Secondary | ICD-10-CM

## 2011-11-11 DIAGNOSIS — F4310 Post-traumatic stress disorder, unspecified: Secondary | ICD-10-CM

## 2011-11-11 LAB — DIFFERENTIAL
Basophils Absolute: 0 10*3/uL (ref 0.0–0.1)
Basophils Relative: 1 % (ref 0–1)
Eosinophils Absolute: 0.1 10*3/uL (ref 0.0–0.7)
Eosinophils Relative: 1 % (ref 0–5)
Lymphocytes Relative: 35 % (ref 12–46)
Lymphs Abs: 2.1 10*3/uL (ref 0.7–4.0)
Monocytes Absolute: 0.3 10*3/uL (ref 0.1–1.0)
Monocytes Relative: 5 % (ref 3–12)
Neutro Abs: 3.5 10*3/uL (ref 1.7–7.7)
Neutrophils Relative %: 58 % (ref 43–77)

## 2011-11-11 LAB — COMPREHENSIVE METABOLIC PANEL
ALT: 84 U/L — ABNORMAL HIGH (ref 0–35)
AST: 66 U/L — ABNORMAL HIGH (ref 0–37)
Albumin: 4.1 g/dL (ref 3.5–5.2)
Alkaline Phosphatase: 117 U/L (ref 39–117)
BUN: 19 mg/dL (ref 6–23)
CO2: 25 mEq/L (ref 19–32)
Calcium: 9.9 mg/dL (ref 8.4–10.5)
Chloride: 101 mEq/L (ref 96–112)
Creatinine, Ser: 0.83 mg/dL (ref 0.50–1.10)
GFR calc Af Amer: >90 mL/min (ref >90)
GFR calc non Af Amer: 82 mL/min — ABNORMAL LOW (ref >90)
Glucose, Bld: 112 mg/dL — ABNORMAL HIGH (ref 70–99)
Potassium: 3.8 mEq/L (ref 3.5–5.1)
Sodium: 140 mEq/L (ref 135–145)
Total Bilirubin: 0.9 mg/dL (ref 0.3–1.2)
Total Protein: 7.7 g/dL (ref 6.0–8.3)

## 2011-11-11 LAB — LITHIUM LEVEL: Lithium Lvl: <0.25 mEq/L — ABNORMAL LOW (ref 0.80–1.40)

## 2011-11-11 LAB — PREGNANCY, URINE: Preg Test, Ur: NEGATIVE

## 2011-11-11 LAB — CBC
HCT: 44 % (ref 36.0–46.0)
Hemoglobin: 15 g/dL (ref 12.0–15.0)
MCH: 31.4 pg (ref 26.0–34.0)
MCHC: 34.1 g/dL (ref 30.0–36.0)
MCV: 92.1 fL (ref 78.0–100.0)
Platelets: 304 10*3/uL (ref 150–400)
RBC: 4.78 MIL/uL (ref 3.87–5.11)
RDW: 13.1 % (ref 11.5–15.5)
WBC: 6.1 10*3/uL (ref 4.0–10.5)

## 2011-11-11 LAB — RAPID URINE DRUG SCREEN, HOSP PERFORMED
Amphetamines: NOT DETECTED
Barbiturates: NOT DETECTED
Benzodiazepines: POSITIVE — AB
Cocaine: NOT DETECTED
Opiates: NOT DETECTED
Tetrahydrocannabinol: NOT DETECTED

## 2011-11-11 MED ORDER — LORAZEPAM 1 MG PO TABS
1.0000 mg | ORAL_TABLET | Freq: Two times a day (BID) | ORAL | Status: AC
  Administered 2011-11-14 (×2): 1 mg via ORAL
  Filled 2011-11-11 (×2): qty 1

## 2011-11-11 MED ORDER — LORAZEPAM 1 MG PO TABS
1.0000 mg | ORAL_TABLET | Freq: Every day | ORAL | Status: AC
  Administered 2011-11-15: 1 mg via ORAL
  Filled 2011-11-11: qty 1

## 2011-11-11 MED ORDER — HYDROXYZINE HCL 25 MG PO TABS: 25.0000 mg | ORAL_TABLET | Freq: Four times a day (QID) | ORAL | Status: AC | PRN

## 2011-11-11 MED ORDER — LORAZEPAM 1 MG PO TABS
1.0000 mg | ORAL_TABLET | Freq: Four times a day (QID) | ORAL | Status: AC
  Administered 2011-11-12 (×4): 1 mg via ORAL
  Filled 2011-11-11 (×4): qty 1

## 2011-11-11 MED ORDER — ACETAMINOPHEN 325 MG PO TABS: 650.0000 mg | ORAL_TABLET | Freq: Four times a day (QID) | ORAL | Status: DC | PRN

## 2011-11-11 MED ORDER — TRAZODONE HCL 50 MG PO TABS
50.0000 mg | ORAL_TABLET | Freq: Every day | ORAL | Status: DC
  Administered 2011-11-11 – 2011-11-16 (×6): 50 mg via ORAL
  Filled 2011-11-11 (×8): qty 1

## 2011-11-11 MED ORDER — ONDANSETRON 4 MG PO TBDP: 4.0000 mg | ORAL_TABLET | Freq: Four times a day (QID) | ORAL | Status: AC | PRN

## 2011-11-11 MED ORDER — ADULT MULTIVITAMIN W/MINERALS CH
1.0000 | ORAL_TABLET | Freq: Every day | ORAL | Status: DC
  Administered 2011-11-11: 1 via ORAL

## 2011-11-11 MED ORDER — LORAZEPAM 1 MG PO TABS
1.0000 mg | ORAL_TABLET | ORAL | Status: AC
  Administered 2011-11-13 (×3): 1 mg via ORAL
  Filled 2011-11-11 (×3): qty 1

## 2011-11-11 MED ORDER — CHLORDIAZEPOXIDE HCL 25 MG PO CAPS: 25.0000 mg | ORAL_CAPSULE | Freq: Every day | ORAL | Status: DC

## 2011-11-11 MED ORDER — VITAMIN B-1 100 MG PO TABS
100.0000 mg | ORAL_TABLET | Freq: Every day | ORAL | Status: DC
  Administered 2011-11-12 – 2011-11-18 (×7): 100 mg via ORAL
  Filled 2011-11-11 (×9): qty 1

## 2011-11-11 MED ORDER — CHLORDIAZEPOXIDE HCL 25 MG PO CAPS: 25.0000 mg | ORAL_CAPSULE | Freq: Three times a day (TID) | ORAL | Status: DC

## 2011-11-11 MED ORDER — MAGNESIUM HYDROXIDE 400 MG/5ML PO SUSP: 30.0000 mL | Freq: Every day | ORAL | Status: DC | PRN

## 2011-11-11 MED ORDER — CHLORDIAZEPOXIDE HCL 25 MG PO CAPS: 25.0000 mg | ORAL_CAPSULE | Freq: Four times a day (QID) | ORAL | Status: DC | PRN

## 2011-11-11 MED ORDER — CHLORDIAZEPOXIDE HCL 25 MG PO CAPS: 25.0000 mg | ORAL_CAPSULE | ORAL | Status: DC

## 2011-11-11 MED ORDER — ALUM & MAG HYDROXIDE-SIMETH 200-200-20 MG/5ML PO SUSP: 30.0000 mL | ORAL | Status: DC | PRN

## 2011-11-11 MED ORDER — THIAMINE HCL 100 MG/ML IJ SOLN: 100.0000 mg | Freq: Once | INTRAMUSCULAR | Status: DC

## 2011-11-11 MED ORDER — LITHIUM CARBONATE 300 MG PO CAPS
300.0000 mg | ORAL_CAPSULE | Freq: Two times a day (BID) | ORAL | Status: DC
Start: 2011-11-11 — End: 2011-11-17
  Administered 2011-11-11 – 2011-11-17 (×12): 300 mg via ORAL
  Filled 2011-11-11 (×16): qty 1

## 2011-11-11 MED ORDER — LOPERAMIDE HCL 2 MG PO CAPS
2.0000 mg | ORAL_CAPSULE | ORAL | Status: AC | PRN
  Administered 2011-11-12 (×4): 2 mg via ORAL
  Filled 2011-11-11 (×2): qty 1

## 2011-11-11 MED ORDER — CHLORDIAZEPOXIDE HCL 25 MG PO CAPS
25.0000 mg | ORAL_CAPSULE | Freq: Four times a day (QID) | ORAL | Status: DC
  Administered 2011-11-11: 25 mg via ORAL
  Filled 2011-11-11: qty 1

## 2011-11-11 MED ORDER — LORAZEPAM 1 MG PO TABS
1.0000 mg | ORAL_TABLET | Freq: Four times a day (QID) | ORAL | Status: DC | PRN
  Administered 2011-11-11 – 2011-11-17 (×7): 1 mg via ORAL
  Filled 2011-11-11 (×7): qty 1

## 2011-11-11 MED ORDER — CHLORDIAZEPOXIDE HCL 25 MG PO CAPS: 50.0000 mg | ORAL_CAPSULE | Freq: Once | ORAL | Status: DC

## 2011-11-11 NOTE — BH Assessment (Signed)
Assessment Note   Kristina Huffman is an 51 y.o. female. Pt reported to [facility] requesting detox. She reported she ran out of her klonopin February 14,2013 and began drinking 2 40oz beers daily until today. This am pt drank 1 cup of beer. She reports Klonopin withdrawals consisting of jerking, tremors, nausea and vomiting in the am, hot flashes and cold sweats, inability to eat and unable to sleep. Pt denies s/i, h/i and is not psychotic nor delusional.  Pt reports taking her prozac 20mg  qd as prescribed. She rep[orts being prescribed 10mg  Ambien qhs but stopped taking it because of the way it made her feel. Pt is alert and oriented x4. Pt reports she is still grieving the death of her 49 yr old sone who died in 08/02/2012from a heroin overdose. Pt reports she has a good support system with her husband and a friend who brought her to the hospital.        Axis I: Substance Abuse-alcohol dependency Axis II: Deferred Axis III:  Past Medical History  Diagnosis Date  . Depression   . COPD (chronic obstructive pulmonary disease)   . Asthma    Axis IV: problems with access to health care services Axis V: 41-50 serious symptoms       Past Medical History:  Past Medical History  Diagnosis Date  . Depression   . COPD (chronic obstructive pulmonary disease)   . Asthma     Past Surgical History  Procedure Date  . Back surgery     Family History: No family history on file.  Social History:  reports that she has never smoked. She does not have any smokeless tobacco history on file. She reports that she drinks alcohol. She reports that she uses illicit drugs (Cocaine).  Additional Social History:    Allergies:  Allergies  Allergen Reactions  . Aspirin     Home Medications:  No current facility-administered medications on file as of 11/11/2011.   Medications Prior to Admission  Medication Sig Dispense Refill  . lithium carbonate 300 MG capsule Take 3 capsules (900 mg total) by mouth  2 (two) times daily in the am and at bedtime..  90 capsule  0  . venlafaxine (EFFEXOR) 50 MG tablet Take 1 tablet (50 mg total) by mouth daily before breakfast.  30 tablet  0    OB/GYN Status:  No LMP recorded.  General Assessment Data Location of Assessment: [facility] Living Arrangements: Spouse/significant other Can pt return to current living arrangement?: Yes Admission Status: Voluntary Is patient capable of signing voluntary admission?: Yes Transfer from: Home Referral Source: Self/Family/Friend  Education Status Contact person: Jonny Ruiz [phone]  Risk to self Suicidal Ideation: No Suicidal Intent: No Is patient at risk for suicide?: No Suicidal Plan?: No Access to Means: No What has been your use of drugs/alcohol within the last 12 months?: beer Previous Attempts/Gestures: Yes How many times?: 1  Other Self Harm Risks: no Triggers for Past Attempts: Other (Comment) (death of son) Intentional Self Injurious Behavior: None Family Suicide History: No Recent stressful life event(s): Conflict (Comment) (medication) Persecutory voices/beliefs?: No Depression: Yes Depression Symptoms: Insomnia;Fatigue;Isolating;Loss of interest in usual pleasures;Feeling worthless/self pity Substance abuse history and/or treatment for substance abuse?: Yes Suicide prevention information given to non-admitted patients: Not applicable  Risk to Others Homicidal Ideation: No Thoughts of Harm to Others: No-Not Currently Present/Within Last 6 Months Current Homicidal Intent: No Current Homicidal Plan: No Access to Homicidal Means: No Identified Victim: na History of  harm to others?: No Assessment of Violence: None Noted Violent Behavior Description: na Does patient have access to weapons?: No Criminal Charges Pending?: No Does patient have a court date: No  Psychosis Hallucinations: None noted Delusions: None noted  Mental Status Report Appear/Hygiene: Other  (Comment) Eye Contact: Good Motor Activity: Freedom of movement Speech: Logical/coherent Level of Consciousness: Alert Mood: Depressed;Helpless;Sad Affect: Appropriate to circumstance Anxiety Level: None Thought Processes: Coherent;Relevant Judgement: Unimpaired Orientation: Person;Place;Time;Situation Obsessive Compulsive Thoughts/Behaviors: None  Cognitive Functioning Concentration: Normal Memory: Recent Intact;Remote Intact IQ: Average Insight: Good Impulse Control: Good Appetite: Good Sleep: Decreased Total Hours of Sleep: 4  Vegetative Symptoms: None  Prior Inpatient Therapy Prior Inpatient Therapy: Yes Reason for Treatment: depression  Prior Outpatient Therapy Prior Outpatient Therapy: Yes Prior Therapy Dates: januarty 2013 Prior Therapy Facilty[facility]Provider[facility]): [facility] Reason for Treatment: depression                     Additional Information 1:1 In Past 12 Months?: No CIRT Risk: No Elopement Risk: No Does patient have medical clearance?: No     Disposition:  Disposition Disposition of Patient: Inpatient treatment program Type of inpatient treatment program: Adult  On Site Evaluation by:   Reviewed with Physician:     Hattie Perch Winford 11/11/2011 11:31 AM

## 2011-11-11 NOTE — [facility] Suicide Risk Assessment (Signed)
Suicide Risk Assessment  Admission Assessment      Demographic factors: See chart.   Current Mental Status: Patient seen and evaluated and chart reviewed. Patient stated that her mood was "not good". Her affect was mood congruent and labile. She denied any current thoughts of self injurious behavior, suicidal ideation or homicidal ideation. There were no auditory or visual hallucinations, paranoia, or delusional thought processes noted. Thought process was linear and goal directed. Sig psychomotor agitation noted. Speech was normal rate, tone and volume. Eye contact was good. Judgment and insight are fair. Patient has been up and engaged on the unit. No acute safety concerns reported from team.   Loss Factors: Loss of son s/p Heroin OD in 07/12; Financial problems / change in socioeconomic status   Historical Factors: Impulsivity; SA   Risk Reduction Factors: Religious beliefs about death; Positive therapeutic relationship; improved coping skills; Hx Tx and residential Tx; husband and friend's support   CLINICAL FACTORS: Alcohol and Benzodiazepine Dependence & W/D; Bipolar Disorder NOS; Complicated Bereavement; PTSD  Patient Active Problem List   Diagnoses   .  Benzodiazepine dependence   .  Bipolar 1 disorder, mixed, moderate   .  Complicated bereavement   .  PTSD (post-traumatic stress disorder)   .  Homeless    COGNITIVE FEATURES THAT CONTRIBUTE TO RISK: limited insight.   SUICIDE RISK: Pt viewed as a chronic increased risk of harm to self in light of her past hx and risk factors. No acute safety concerns on the unit at this time.  Contracting for safety.    PLAN OF CARE:  Pt admitted for crisis stabilization and treatment.  Please see orders.   Medications reviewed with pt and medication education provided.  Pt seen and evaluated with physician extender, please see H&P.  Will continue q15 minute checks per unit protocol.  No clinical indication for one on one level of observation at  this time.  Pt contracting for safety.  Mental health treatment, medication management and continued sobriety will mitigate against the increased risk of harm to self and/or others.  Discussed the importance of recovery with pt, as well as, tools to move forward in a healthy & safe manner.  Pt agreeable with the plan.  Discussed with the team.    Lupe Carney 11/11/2011, 4:09 PM

## 2011-11-11 NOTE — Progress Notes (Signed)
Pt anxious and c/o abdominal pain while waiting to be assessed by MD or PA. Offered support and prn tylenol. Pt states that she is HEP C+.  Gave warm pack instead. Pt remains safe on unit.

## 2011-11-11 NOTE — Progress Notes (Signed)
Adult Psychosocial Assessment Update Interdisciplinary Team  Previous [facility] admissions/discharges:  Admissions Discharges  Date:09/19/2011 Date: 09/27/2011  Date: Date:  Date: Date:  Date: Date:  Date: Date:   Changes since the last Psychosocial Assessment (including adherence to outpatient mental health and/or substance abuse treatment, situational issues contributing to decompensation and/or relapse). Patient  Is a 51 YO married caucasian female admitted with diagnosis of substance abuse -alcohol dependency. Patient was recently discharged from [facility] in early January 2013 admitted to [facility]  In [place]. Patient reports while there she was short on cash due to providing motel money to husband who left [place] to follow her there.  The the patient and husband have since returned to [facility] and had [facility] [address] which she reports is a sober living house. Patient also reports recent stressors include 54-year-old daughter who has been staying with her cousin who reports they wish to adopt the daughter and the court agrees. Patient also reports she was taken off Klonopin and mental and psychological status has decreased since that time. Patient will benefit from crisis stabilization, medication evaluation, group therapy and psych education groups in addition to case management for discharge planning.              Discharge Plan 1. Will you be returning to the same living situation after discharge?   Yes:  No:      If no, what is your plan?    Patient wishes to discuss her housing issues as she is confused as to whether the halfway house is doable as husband is currently unable to pay the rent       2. Would you like a referral for services when you are discharged? Yes:  X   If yes, for what services?  No:        Pt requests followup for medication and perhaps therapy        Summary and Recommendations (to be completed by the  evaluator)  Patient is 51 YO married female admitted with diagnosis of alcohol dependency and requesting detox following recent relapse she reports was in response to medication noncompliance. Patient stressors include a 71 year old son's suicide in July of 6721 and 49-year-old daughter's placement outside the home by social services, and uncertainty about housing. Patient will benefit from crisis stabilization, medication evaluation, group therapy and psych education groups in addition to case management for discharge planning.                        Signature:  Clide Dales, 11/11/2011 6:12 PM
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
## 2011-11-11 NOTE — H&P (Signed)
Psychiatric Admission Assessment Adult  Patient Identification:  Kristina Huffman Date of Evaluation:  11/11/2011 Chief Complaint:  ETOH DEP History of Present Illness:Pt. Presents as a walk-in patient to [facility] after a week at [facility] where she went after her last discharge from [facility] on 09/21/2011.  She left [facility] after 1 week at her husband's insistence and admits that it was too early for her to leave. She ran out of her medications and was not able to see a psychiatrist to renew meds, and refilled old prescriptions from [facility] for prozac and klonopin.  After those ran out she relapsed on alcohol and developed mania. Mood Symptoms:  Depression, Hopelessness, Hypomania/Mania, Mood Swings, Sadness, Depression Symptoms:  depressed mood, fatigue, difficulty concentrating, anxiety, (Hypo) Manic Symptoms:  Elevated Mood, Labiality of Mood, Anxiety Symptoms: increased anxiety  Psychotic Symptoms: none   PTSD Symptoms: Had a traumatic exposure:    Past Psychiatric History: Diagnosis: Bipolar with mania and alcohol dependence, benzodiazepine abuse.  Hospitalizations:  [facility] 08/2011  Outpatient Care:  Substance Abuse Care:  Self-Mutilation:  Suicidal Attempts:  Violent Behaviors:   Past Medical History:   Past Medical History  Diagnosis Date  . Depression   . COPD (chronic obstructive pulmonary disease)   . Asthma    None. Allergies:   Allergies  Allergen Reactions  . Aspirin   . Sulfa Antibiotics    PTA Medications: Prescriptions prior to admission  Medication Sig Dispense Refill  . lithium carbonate 300 MG capsule Take 3 capsules (900 mg total) by mouth 2 (two) times daily in the am and at bedtime..  90 capsule  0  . venlafaxine (EFFEXOR) 50 MG tablet Take 1 tablet (50 mg total) by mouth daily before breakfast.  30 tablet  0    Previous Psychotropic Medications:  Medication/Dose                 Substance Abuse History in the last 12 months: Substance Age of 1st  Use Last Use Amount Specific Type  Nicotine      Alcohol      Cannabis      Opiates      Cocaine      Methamphetamines      LSD      Ecstasy      Benzodiazepines      Caffeine      Inhalants      Others:                         Consequences of Substance Abuse: Family Consequences:  Son died of overdose  Social History: Current Place of Residence:   Place of Birth:   Family Members: Marital Status:  Married Children:  Sons:  Daughters: Relationships: Education:   Educational Problems/Performance: Religious Beliefs/Practices: History of Abuse (Emotional/Phsycial/Sexual) Occupational Experiences; Military History:  None. Legal History: Hobbies/Interests:  Family History:  No family history on file. ROS: Negative with the exception of blurred vision, tingling in face and fingers, poor sleep, nausea,diarrhea,sweats, chills, with aching all over, the remainder was negative. PE: BP 159/101  Pulse 86  Temp(Src) 97.8 F (36.6 C) (Oral)  Resp 24  SpO2 99%  LMP 09/15/2011  General Appearance:    Alert, cooperative, no distress, appears stated age  Head:    Normocephalic, without obvious abnormality, atraumatic  Eyes:    PERRL, conjunctiva/corneas clear, EOM's intact, fundi    benign, both eyes  Ears:    Normal TM's and external ear canals,  both ears  Nose:   Nares normal, septum midline, mucosa normal, no drainage    or sinus tenderness  Throat:   Lips, mucosa, and tongue normal; teeth and gums normal  Neck:   Supple, symmetrical, trachea midline, no adenopathy;    thyroid:  no enlargement/tenderness/nodules; no carotid   bruit or JVD  Back:     Symmetric, no curvature, ROM normal, no CVA tenderness  Lungs:     Clear to auscultation bilaterally, respirations unlabored  Chest Wall:    No tenderness or deformity   Heart:    Regular rate and rhythm, S1 and S2 normal, no murmur, rub   or gallop  Breast Exam:    No tenderness, masses, or nipple abnormality  Abdomen:      Soft, non-tender, bowel sounds active all four quadrants,    no masses, no organomegaly  Genitalia:    Normal female without lesion, discharge or tenderness  Rectal:    Normal tone, normal prostate, no masses or tenderness;   guaiac negative stool  Extremities:   Extremities normal, atraumatic, no cyanosis or edema  Pulses:   2+ and symmetric all extremities  Skin:   Skin color, texture, turgor normal, no rashes or lesions  Lymph nodes:   Cervical, supraclavicular, and axillary nodes normal  Neurologic:   CNII-XII intact, normal strength, sensation and reflexes    throughout   Mental Status Examination/Evaluation: Objective:  Appearance: Disheveled  Eye Contact::  Fair  Speech:  Clear and Coherent and Pressured  Volume:  Normal  Mood:  Anxious and Depressed  Affect:  Appropriate  Thought Process:  Coherent  Orientation:  Full  Thought Content:  WDL  Suicidal Thoughts:  No  Homicidal Thoughts:  No  Memory:  Immediate;   Fair  Judgement:  Impaired  Insight:  Fair  Psychomotor Activity:  Normal  Concentration:  Fair  Recall:  Fair  Akathisia:  No  Handed:    AIMS (if indicated):     Assets:  Communication Skills Desire for Improvement  Sleep:       Laboratory/X-Ray Psychological Evaluation(s)      Assessment:    AXIS I:  Bipolar disorder current episode mixed, alcohol dependency, benzodiazepine abuse AXIS II:  Deferred AXIS III:   Past Medical History  Diagnosis Date  . Depression   . COPD (chronic obstructive pulmonary disease)   . Asthma    AXIS IV:  problems with access to health care services AXIS V:  51-60 moderate symptoms  Treatment Plan/Recommendations:  Treatment Plan Summary: Daily contact with patient to assess and evaluate symptoms and progress in treatment Medication management Current Medications:  No current facility-administered medications for this encounter.   Observation Level/Precautions:  Detox  Laboratory:  CMP, CBC-diff, UPT, UDS    Psychotherapy:    Medications:    Routine PRN Medications:  Yes  Consultations:    Discharge Concerns:    Other:     Lloyd Huger T. Tyrin Herbers PAC For Dr. Lupe Carney 2/19/201312:54 PM

## 2011-11-12 MED ORDER — DM-GUAIFENESIN ER 30-600 MG PO TB12
1.0000 | ORAL_TABLET | Freq: Two times a day (BID) | ORAL | Status: DC
  Administered 2011-11-12 – 2011-11-16 (×8): 1 via ORAL
  Filled 2011-11-12 (×16): qty 1

## 2011-11-12 NOTE — Progress Notes (Signed)
[facility] Group Notes:  (Counselor/Nursing/MHT/Case Management/Adjunct)  11/12/2011 5:57 PM  Type of Therapy:  Group Therapy  Participation Level:  None; patient not feeling well and left group at several points.   Clide Dales 11/12/2011, 5:57 PM

## 2011-11-12 NOTE — Progress Notes (Signed)
Pt attended morning group. Endorses feelings of anxiety and agitation. Pt was referred to [facility] program after her discharge from [facility] the last time she was admitted. Pt  States that she left after only being there for a week due to a bad experience with another resident and her social anxiety. Pt states that she was unable to see the doctor in a reasonable time in order to adjust her medications that were giving her complications. Pt states that she left and began to drink again because of the absence of her medication. Pt reports her stressors as being a marriage that is not stable because of financial difficulties, as well as not being able to take care of her son because of the financial stressors. Pt states that she is still having difficulty dealing with the death of her son and is hopeful that being on a medication will solve her symptoms of depression and anxiety. Pt was reluctant to accept Klonopin as being a negative way to cope with her depression and anxiety. Discussed with Pt in treatment team the importance of finding alternative ways to cope instead of medication. Pt plans to get involved in vocational rehab in hopes of addressing issues of financial hardships. [facility] is possible placement for pt. Case manager will continue to discuss treatment options with pt.

## 2011-11-12 NOTE — Progress Notes (Signed)
Pt reports she is feeling a little better, but still has a lot of anxiety.  Pt was a new admit from earlier in the day.  She was just here in Dec.  She has been put on an Ativan taper for withdrawal symptoms.  She relapsed after her meds ran out.  Encouraged pt to make her needs/concerns known.  Pt denies SI/HI.  Safety maintained with q15 minute checks.

## 2011-11-12 NOTE — Progress Notes (Signed)
[facility] Group Notes:  (Counselor/Nursing/MHT/Case Management/Adjunct)  11/12/2011 12:02 PM  Type of Therapy:  Group Therapy  Participation Level:  Active  Participation Quality:  Attentive  Affect:  Appropriate  Cognitive:  Appropriate  Insight:  Good  Engagement in Group:  Good  Engagement in Therapy:  Good  Modes of Intervention:  Support  Summary of Progress/Problems: Pt actively participated in group, discussing issues in her own relationship and offering insightful responses to problems posed by other group members.   Mendleson, Gilberto Stanforth 11/12/2011, 12:02 PM

## 2011-11-12 NOTE — Progress Notes (Signed)
Pt observed in her room in the dark.  She reports she is still having bouts of diarrhea.  Another dose of imodium given at this time.  She says she is feeling shaky inside.  Assured her this is part of withdrawal.  She says otherwise she is feeling ok.  Encouraged pt to continue letting staff know about any symptoms she is having.  She denies SI/HI.  She states she does intend to go to group this evening.  Pt is on an Ativan taper.  Discharge plans are incomplete.  Safety maintained with q15 minute checks.

## 2011-11-12 NOTE — Progress Notes (Signed)
Patient ID: Kristina Huffman, female   DOB: [birth year], 51 y.o.   MRN: [id number] Pt is awake and active on the unit this AM. Pt is attending groups and is cooperative with staff. Pt c/o agitation and depression. Pt denies SI/Hi and is cooperative with staff. Pt is ambivalent about changing from Klonopin to an anti-depressant in order to manage her anxiety and depression. Pt is resistant to accepting the negative side-effects because she still desires it. Staff in tx team explained the options yet pt is still unconvinced that it is the best course for her to adjust medications. Writer offered emotional support and encouraged her to consider the options. Pt agreed. Writer will continue to monitor.

## 2011-11-12 NOTE — Progress Notes (Signed)
[facility] MD Progress Note  11/12/2011 3:06 PM  Diagnosis:  Bipolar disorder most recent episode mixed, Alcohol dependency, Benzodiazepine dependency  ADL's:  Intact  Sleep: Good Pt. States from 11 pm to 6 AM  Appetite:  Fair            The patient was seen and evaluated by the Treatment team consisting of Psychiatrist, PAC, RN, Case Manager, and Therapist for evaluation and treatment plan with goal of stabilization upon discharge. The patient's physical and mental health problems were identified and treated appropriately.      Multiple modalities of treatment were used including medication, individual and group therapies, unit programming, AA/NA, improved nutrition, physical activity, and family sessions as needed.      Mental Status Examination/Evaluation: Objective:  Appearance: Casual  Eye Contact::  Good  Speech:  Clear and Coherent  Volume:  Normal  Mood:  Anxious  Affect:  Appropriate  Thought Process:  Circumstantial and Linear  Orientation:  Full  Thought Content:  WDL  Suicidal Thoughts:  no  Homicidal Thoughts:  No  Memory:  Immediate;   Fair  Judgement:  Poor  Insight:  Lacking  Psychomotor Activity:  Normal  Concentration:  Fair  Recall:    Akathisia:  No  Handed:    AIMS (if indicated):     Assets:    Sleep:  Number of Hours: 6.75    Vital Signs:Blood pressure 123/74, pulse 103, temperature 97.7 F (36.5 C), temperature source Oral, resp. rate 18, last menstrual period 09/15/2011, SpO2 99.00%. Current Medications: Current Facility-Administered Medications  Medication Dose Route Frequency Provider Last Rate Last Dose  . alum & mag hydroxide-simeth (MAALOX/MYLANTA) 200-200-20 MG/5ML suspension 30 mL  30 mL Oral Q4H PRN Verne Spurr, PA      . dextromethorphan-guaiFENesin [facility] DM) 30-600 MG per 12 hr tablet 1 tablet  1 tablet Oral BID Verne Spurr, PA   1 tablet at 11/12/11 1056  . hydrOXYzine (ATARAX/VISTARIL) tablet 25 mg  25 mg Oral Q6H PRN Alyson  Kuroski-Mazzei, DO      . lithium carbonate capsule 300 mg  300 mg Oral BID WC Alyson Kuroski-Mazzei, DO   300 mg at 11/12/11 0813  . loperamide (IMODIUM) capsule 2-4 mg  2-4 mg Oral PRN Alyson Kuroski-Mazzei, DO      . LORazepam (ATIVAN) tablet 1 mg  1 mg Oral Q6H Randy Readling, MD   1 mg at 11/12/11 1159  . LORazepam (ATIVAN) tablet 1 mg  1 mg Oral BH-[id number] Randy Readling, MD      . LORazepam (ATIVAN) tablet 1 mg  1 mg Oral BID Franchot Gallo, MD      . LORazepam (ATIVAN) tablet 1 mg  1 mg Oral Daily Randy Readling, MD      . LORazepam (ATIVAN) tablet 1 mg  1 mg Oral Q6H PRN Franchot Gallo, MD   1 mg at 11/11/11 2149  . magnesium hydroxide (MILK OF MAGNESIA) suspension 30 mL  30 mL Oral Daily PRN Verne Spurr, PA      . ondansetron (ZOFRAN-ODT) disintegrating tablet 4 mg  4 mg Oral Q6H PRN Alyson Kuroski-Mazzei, DO      . thiamine (B-1) injection 100 mg  100 mg Intramuscular Once [employer], DO      . thiamine (VITAMIN B-1) tablet 100 mg  100 mg Oral Daily Alyson Kuroski-Mazzei, DO   100 mg at 11/12/11 0813  . traZODone (DESYREL) tablet 50 mg  50 mg Oral QHS Alyson Kuroski-Mazzei, DO   50  mg at 11/11/11 2149  . DISCONTD: acetaminophen (TYLENOL) tablet 650 mg  650 mg Oral Q6H PRN Verne Spurr, PA      . DISCONTD: chlordiazePOXIDE (LIBRIUM) capsule 25 mg  25 mg Oral Q6H PRN Alyson Kuroski-Mazzei, DO      . DISCONTD: chlordiazePOXIDE (LIBRIUM) capsule 25 mg  25 mg Oral QID Alyson Kuroski-Mazzei, DO   25 mg at 11/11/11 1811  . DISCONTD: chlordiazePOXIDE (LIBRIUM) capsule 25 mg  25 mg Oral TID Alyson Kuroski-Mazzei, DO      . DISCONTD: chlordiazePOXIDE (LIBRIUM) capsule 25 mg  25 mg Oral BH-qamhs Alyson Kuroski-Mazzei, DO      . DISCONTD: chlordiazePOXIDE (LIBRIUM) capsule 25 mg  25 mg Oral Daily Alyson Kuroski-Mazzei, DO      . DISCONTD: chlordiazePOXIDE (LIBRIUM) capsule 50 mg  50 mg Oral Once [employer], DO      . DISCONTD: mulitivitamin with minerals tablet 1 tablet  1  tablet Oral Daily Alyson Kuroski-Mazzei, DO   1 tablet at 11/11/11 1812    Lab Results:  Results for orders placed during the hospital encounter of 11/11/11 (from the past 48 hour(s))  URINE RAPID DRUG SCREEN (HOSP PERFORMED)     Status: Abnormal   Collection Time   11/11/11  2:06 PM      Component Value Range Comment   Opiates NONE DETECTED  NONE DETECTED     Cocaine NONE DETECTED  NONE DETECTED     Benzodiazepines POSITIVE (*) NONE DETECTED     Amphetamines NONE DETECTED  NONE DETECTED     Tetrahydrocannabinol NONE DETECTED  NONE DETECTED     Barbiturates NONE DETECTED  NONE DETECTED    PREGNANCY, URINE     Status: Normal   Collection Time   11/11/11  4:59 PM      Component Value Range Comment   Preg Test, Ur NEGATIVE  NEGATIVE    CBC     Status: Normal   Collection Time   11/11/11  8:08 PM      Component Value Range Comment   WBC 6.1  4.0 - 10.5 (K/uL)    RBC 4.78  3.87 - 5.11 (MIL/uL)    Hemoglobin 15.0  12.0 - 15.0 (g/dL)    HCT 16.1  09.6 - 04.5 (%)    MCV 92.1  78.0 - 100.0 (fL)    MCH 31.4  26.0 - 34.0 (pg)    MCHC 34.1  30.0 - 36.0 (g/dL)    RDW 40.9  81.1 - 91.4 (%)    Platelets 304  150 - 400 (K/uL)   DIFFERENTIAL     Status: Normal   Collection Time   11/11/11  8:08 PM      Component Value Range Comment   Neutrophils Relative 58  43 - 77 (%)    Neutro Abs 3.5  1.7 - 7.7 (K/uL)    Lymphocytes Relative 35  12 - 46 (%)    Lymphs Abs 2.1  0.7 - 4.0 (K/uL)    Monocytes Relative 5  3 - 12 (%)    Monocytes Absolute 0.3  0.1 - 1.0 (K/uL)    Eosinophils Relative 1  0 - 5 (%)    Eosinophils Absolute 0.1  0.0 - 0.7 (K/uL)    Basophils Relative 1  0 - 1 (%)    Basophils Absolute 0.0  0.0 - 0.1 (K/uL)   COMPREHENSIVE METABOLIC PANEL     Status: Abnormal   Collection Time   11/11/11  8:08 PM  Component Value Range Comment   Sodium 140  135 - 145 (mEq/L)    Potassium 3.8  3.5 - 5.1 (mEq/L)    Chloride 101  96 - 112 (mEq/L)    CO2 25  19 - 32 (mEq/L)    Glucose, Bld  112 (*) 70 - 99 (mg/dL)    BUN 19  6 - 23 (mg/dL)    Creatinine, Ser 1.19  0.50 - 1.10 (mg/dL)    Calcium 9.9  8.4 - 10.5 (mg/dL)    Total Protein 7.7  6.0 - 8.3 (g/dL)    Albumin 4.1  3.5 - 5.2 (g/dL)    AST 66 (*) 0 - 37 (U/L)    ALT 84 (*) 0 - 35 (U/L)    Alkaline Phosphatase 117  39 - 117 (U/L)    Total Bilirubin 0.9  0.3 - 1.2 (mg/dL)    GFR calc non Af Amer 82 (*) >90 (mL/min)    GFR calc Af Amer >90  >90 (mL/min)   LITHIUM LEVEL     Status: Abnormal   Collection Time   11/11/11  8:08 PM      Component Value Range Comment   Lithium Lvl <0.25 (*) 0.80 - 1.40 (mEq/L)       CIWA:  CIWA-Ar Total: 6   Treatment Plan Summary: Daily contact with patient to assess and evaluate symptoms and progress in treatment Medication management  Plan: Pt. Is open to considering residential treatment upon discharge, due to her unstable living arrangements with her husband. She shows good insight into returning into a stressful situation and the relapse.  Kristina Huffman 11/12/2011, 3:06 PM

## 2011-11-12 NOTE — Treatment Plan (Signed)
Interdisciplinary Treatment Plan Update (Adult)  Date: 11/12/2011  Time Reviewed: 10:16 AM   Progress in Treatment: Attending groups: Yes Participating in groups: Yes Taking medication as prescribed: Yes Tolerating medication: Yes   Family/Significant other contact made:  Yes  Counselor to contact husband Patient understands diagnosis:  Yes  As evidenced by asking for help with anxiety and substance abuse Discussing patient identified problems/goals with staff:  Yes  See below Medical problems stabilized or resolved:  Yes Denies suicidal/homicidal ideation: Yes  Per self inventory Issues/concerns per patient self-inventory:  Yes  I am coughing up "black stuff"  Financial challenges  Withdrawal symptoms Other:  New problem(s) identified: N/A  Reason for Continuation of Hospitalization:   Anxiety Depression Medication stabilization Withdrawal symptoms  Interventions implemented related to continuation of hospitalization: Librium detox protocol,  Encourage group attendance and participation, particularly focused on coping skills  Additional comments:  Estimated length of stay :2-3 days  Discharge Plan: Unclear at this point  New goal(s): N/A  Review of initial/current patient goals per problem list:   1.  Goal(s):Safely detox from benzos, alcohol  Met:  No  Target date:2/22  As evidenced [id number] in CIWA score from current to 0  2.  Goal (s):Eliminate SI  Met:  Yes  Target date:2/20  As evidenced [id number] in tx team  3.  Goal(s):Decrease depression, anxiety  Met:  No  Target date:2/22  As evidenced ZH:YQMV report  4.  Goal(s):Identify comprehensive sobriety plan  Met:  No  Target date:2/22  As evidenced by: Eleonora will verbalize the plan that she feels gives her the best chance of maintaining sobriety while also dealing with anxiety  Attendees: Patient:  Kristina Huffman 11/12/2011 10:16 AM  Family:     Physician:   11/12/2011 10:16 AM     Nursing: Cato Mulligan   11/12/2011 10:16 AM   Case Manager:  Richelle Ito, LCSW 11/12/2011 10:16 AM   Counselor:  Ronda Fairly, LCSWA 11/12/2011 10:16 AM   Other:  Verne Spurr 11/12/2011 10:16 AM  Other:     Other:     Other:      Scribe for Treatment Team:   Ida Rogue, 11/12/2011 10:16 AM  r

## 2011-11-13 DIAGNOSIS — J069 Acute upper respiratory infection, unspecified: Secondary | ICD-10-CM | POA: Diagnosis present

## 2011-11-13 DIAGNOSIS — F1020 Alcohol dependence, uncomplicated: Principal | ICD-10-CM

## 2011-11-13 MED ORDER — AZITHROMYCIN 250 MG PO TABS
250.0000 mg | ORAL_TABLET | Freq: Every day | ORAL | Status: AC
  Administered 2011-11-15 – 2011-11-18 (×4): 250 mg via ORAL
  Filled 2011-11-13 (×4): qty 1

## 2011-11-13 MED ORDER — AZITHROMYCIN 500 MG PO TABS
500.0000 mg | ORAL_TABLET | Freq: Every day | ORAL | Status: AC
  Administered 2011-11-14: 500 mg via ORAL
  Filled 2011-11-13: qty 2
  Filled 2011-11-13: qty 1

## 2011-11-13 NOTE — Progress Notes (Signed)
Pt attended morning group and actively participated. Pt states that she did not sleep well last night but feels more calm today than she did yesterday. Pt discussed her concerns about a chest infection and her experience coughing up black phlegm.CM and pt discussed [facility] as an option for treatment. Pt would like to explore outpatient treatment options as well. Pt states that she is still attempting to figure out the most logical plan for her after d/c and plans to discuss treatment options with her husband.

## 2011-11-13 NOTE — Progress Notes (Signed)
Pt has been up and has been active while in the milieu throughout the day today, actively participating and attending activities, pt has stated that she is doing better today, has not had any complaints of diarrhea, pt has received medications without incident, support provided, will continue to monitor

## 2011-11-13 NOTE — Progress Notes (Signed)
11/13/2011         Time: 1415      Group Topic/Focus: The focus of the group is on enhancing the patients' ability to cope with stressors by understanding what coping is, why it is important, the negative effects of stress and developing healthier coping skills.  Participation Level: Active  Participation Quality: Resistant  Affect: Irritable   Cognitive: Oriented   Additional Comments: Patient focused on the fact that her medications have been changes, says she needed the old regimen to help deal with the recent death of her son. Patient walked out of group early and didn't return.  Karee Forge 11/13/2011 3:47 PM

## 2011-11-13 NOTE — Progress Notes (Signed)
[facility] Group Notes:  (Counselor/Nursing/MHT/Case Management/Adjunct)  11/13/2011   Type of Therapy:  Group Therapy  At 11:00 AM  Participation Level:  Active  Participation Quality:  Appropriate  Affect:  Appropriate  Cognitive:  Alert and Oriented  Insight:  Limited  Engagement in Group:  Good  Engagement in Therapy:  Limited  Modes of Intervention:  Limit-setting, Socialization and Support  Summary of Progress/Problems:  Kristina Huffman participated in group discussion on topic of a balanced life and describing balance is having her basic needs met and she has struggled with that for some time. "It makes everything harder if you do not know where you're going to eat, going to sleep, critical to have a roof over your head. When things are not going well I feel like I am trapped like in the spider web, no way out it feels awful and all you can do is try to get it off yourself." "When things are going well I see the ocean, the sky and a new freedom"   Clide Dales 11/13/2011, 4:00 PM  [facility] Group Notes:  (Counselor/Nursing/MHT/Case Management/Adjunct)  11/13/2011   Type of Therapy:  Group Therapy at 1:15  Participation Level:  Active  Participation Quality:  Inattentive, Sharing and Supportive  Affect:  Flat  Cognitive:  Alert and Oriented  Insight:  Limited  Engagement in Group:  Limited  Engagement in Therapy:  Limited  Modes of Intervention:  Limit-setting, Socialization and Support  Summary of Progress/Problems:  Kristina Huffman contributions to the conversation on gratitude  and acceptance were loosely connected. Patient shared her uncomfortableness re an infer change between staff and herself when told there is no magic pill for her pain. Patient later shared gratitude for a distant parent,    Clide Dales 11/13/2011, 4:05 PM

## 2011-11-13 NOTE — Progress Notes (Signed)
Patient ID: Kristina Huffman, female   DOB: 1961/06/19, 51 y.o.   MRN: [id number] Kristina Huffman  51 y.o.  [id number] 05-Dec-1960  11/13/2011   Diagnosis:  Alcohol dependency, Benzodiazepine dependence, Bipolar D/O most recent episode manic  Subjective: Pt. Notes that she is doing better today and feels much better.  However she is still coughing and notes that for the past 10 days she has had a productive cough with some chest tightness especially in the morning.  She denies wheezing, fever, sweats or chills.    As to her bipolar symptoms she notes that she is still sleeping poorly, her appetite is fair. She has not side effects to the medication. Her speech is clear, eye contact is improved, mood is less agitated, affect is appropriate.  She is not psychotic and is in full contact with reality, denying AH/VH. Notes no SI/HI.  Objective:Vital Signs:Blood pressure 125/84, pulse 97, temperature 97.6 F (36.4 C), temperature source Oral, resp. rate 22, last menstrual period 09/15/2011, SpO2 99.00%.     Assessment: As above, with URI  Medications Scheduled:  . dextromethorphan-guaiFENesin  1 tablet Oral BID  . lithium carbonate  300 mg Oral BID WC  . LORazepam  1 mg Oral Q6H  . LORazepam  1 mg Oral [id number]  . LORazepam  1 mg Oral BID  . LORazepam  1 mg Oral Daily  . thiamine  100 mg Intramuscular Once  . thiamine  100 mg Oral Daily  . traZODone  50 mg Oral QHS     PRN Meds alum & mag hydroxide-simeth, hydrOXYzine, loperamide, LORazepam, magnesium hydroxide, ondansetron  Plan: Continue current plan of care with no changes at this time. Will add Zpack for URI sx of productive cough.  Rona Ravens. Kristina Huffman [facility] 11/13/2011 10:03 PM

## 2011-11-13 NOTE — Progress Notes (Signed)
Patient ID: Kristina Huffman, female   DOB: 08-16-1961, 51 y.o.   MRN: [id number] Pt denies SI/HI/AVH.  She reports on her patient self inventory that she slept well, appetite improving, low energy level, ability to pay attention is improving, experiencing some diarrhea and chills with detox.  Masayo complains of upper respiratory congestions with productive coughing--on Mucinex--provider notified of her complaints--vital signs WNL except slightly tachycardic.  Her goal when she goes home is to take her medication to prevent a relapse.

## 2011-11-14 NOTE — H&P (Signed)
Pt seen and evaluated upon admission with physician extender.  See full H&P/PA.  Completed Admission Suicide Risk Assessment.  See orders.  Pt agreeable with plan.  Discussed with team.    

## 2011-11-14 NOTE — Progress Notes (Signed)
Lying quietly in bed with eyes closed.  Q15 min safety checks are being conducted.  Asleep and safe at this time. 

## 2011-11-14 NOTE — Progress Notes (Signed)
[facility] MD Progress Note  11/14/2011 2:36 PM  S/O:  Patient seen and evaluated and chart reviewed. Patient stated that her mood was "better now". Her affect was mood congruent and stable. She denied any current thoughts of self injurious behavior, suicidal ideation or homicidal ideation. There were no auditory or visual hallucinations, paranoia, or delusional thought processes noted. Thought process was linear and goal directed. Psychomotor agitation improved. Speech was normal rate, tone and volume. Eye contact was good. Judgment and insight are fair. Patient has been up and engaged on the unit. No acute safety concerns reported from team.  Anxiety better controlled, yet pt with sig URI s/s at this time and started on antibiotic for sinus pain earlier this wk.   Sleep:  Number of Hours: 6.5    Vital Signs:Blood pressure 126/75, pulse 87, temperature 98 F (36.7 C), temperature source Oral, resp. rate 20, last menstrual period 09/15/2011, SpO2 99.00%.  Current Medications:    . azithromycin  500 mg Oral Daily   Followed by  . azithromycin  250 mg Oral Daily  . dextromethorphan-guaiFENesin  1 tablet Oral BID  . lithium carbonate  300 mg Oral BID WC  . LORazepam  1 mg Oral [id number]  . LORazepam  1 mg Oral BID  . LORazepam  1 mg Oral Daily  . thiamine  100 mg Intramuscular Once  . thiamine  100 mg Oral Daily  . traZODone  50 mg Oral QHS    Lab Results: No results found for this or any previous visit (from the past 48 hour(s)).  Physical Findings: CIWA:  CIWA-Ar Total: 2   A/P: Alcohol and Benzodiazepine Dependence & W/D; Bipolar Disorder NOS; Complicated Bereavement; PTSD   Treatment goals and medication management reviewed with patient.  Continue current treatment plan and medications.  No SEs reported at current dosages.  Pt wanted to hold off on any increase in meds until s/p detox. Lithium at 300/600 during last admission.  Discussed with team.  Lupe Carney 11/14/2011,  2:36 PM

## 2011-11-14 NOTE — Progress Notes (Signed)
[facility] Group Notes:  (Counselor/Nursing/MHT/Case Management/Adjunct)  11/14/2011 2:34 PM  Type of Therapy:  Group Therapy at 1:15PM  Participation Level:  Active  Participation Quality:  Attentive, Intrusive and Sharing  Affect:  Anxious  Cognitive:  Alert and Oriented  Insight:  Limited  Engagement in Group:  Good  Engagement in Therapy:  Limited  Modes of Intervention:  Clarification, Limit-setting, Socialization and Support  Summary of Progress/Problems:  Kristina Huffman felt she was experiencing anxiety attacks during group but was able to talk and share throughout the group. She did appear to be flushed and heated.  Patient expressed this may during group discussion on post acute withdrawal syndrome. Patient may believe that she is different from others in recovery. Patient expresses doubt that she can recover. When asked to stand and share these words the patient became very emotional but complied "I am ready and able to walk through his postacute withdrawal syndrome and change violent better". Clide Dales 11/14/2011, 2:34 PM

## 2011-11-14 NOTE — Progress Notes (Signed)
Pt states she slept well, appetite is improving. Energy level is "low-normal". Focus is improving. Pt still c/o agitation as a sign of withdrawal. Pt denies SI/HI. Pt's pain goal "about a 6". Pt's goal once she goes home is to "take medications". Pt told this nurse she felt better today.

## 2011-11-14 NOTE — Progress Notes (Signed)
[facility] Group Notes:  (Counselor/Nursing/MHT/Case Management/Adjunct)  11/14/2011 11:57 AM  Type of Therapy:  Group Therapy  Participation Level:  Did Not Attend  Participation Quality:  did not attend  Affect:  Appropriate  Cognitive:  Appropriate  Insight:  None  Engagement in Group:  None  Engagement in Therapy:  None  Modes of Intervention:  Support  Summary of Progress/Problems:   Kristina Huffman 11/14/2011, 11:57 AM

## 2011-11-15 NOTE — Progress Notes (Signed)
Writer introduced self to patient as her nurse for the shift. Writer inquired as to how her day had been and patient reported that it was better. Patient was informed of her scheduled medication of Trazadone and she inquired about medication for anxiety. Patient asked that she receive Ativan 1 mg along with her Trazadone at 2130. Patient was supported and encouraged.  Patient currently denies having pain, -si/hi/a/v hall. Safety maintained on unit, will continue to monitor.

## 2011-11-15 NOTE — Progress Notes (Signed)
Patient ID: Kristina Huffman, female   DOB: [birth year], 51 y.o.   MRN: [id number] BP: 117/79 Pulse: 98   Kristina Huffman is doing a little better today.  She notes that her breathing is better.  Voices no new complaints. She has been up and active in the milieu.  Sleep is ok, appetite is fair.  She is alert and oriented x 3.  She has clear goal directed speech, her mood is depressed and her affect is anxious.  We have discussed her grief and anxiety about her son's death, her husband's life, her father's declining health, and the loss to DSS of her 3 other children.  Kristina Huffman has become mired down in her grief and tends to spiral down.  She is asked to decide what she would like to do with her future as she can not change the past.  The patient is encouraged to make positive choices or to continue to suffer the same life she has been living.  Her insight is fair her judgement is lacking.  No evidence of mania today, and no psychosis.  A.) Alcohol dependency      Benzo dependence      Bipolar disorder  P.) Pt. Is given a list of halfway houses in [place] to start calling to find a place to stay when her detox is completed and her bipolar symptoms are stable.  Rona Ravens. Deondra Labrador PAC

## 2011-11-15 NOTE — Progress Notes (Signed)
Asleep at long intervals.  Safety maintained via Q 15 min safety checks.

## 2011-11-15 NOTE — Progress Notes (Signed)
[facility] Group Notes:  (Counselor/Nursing/MHT/Case Management/Adjunct)  11/15/2011 3:23 PM  Type of Therapy:  Group Therapy  Participation Level:  Active  Participation Quality:  Appropriate, Attentive and Sharing  Affect:  Appropriate  Cognitive:  Appropriate  Insight:  Good  Engagement in Group:  Good  Engagement in Therapy:  Good  Modes of Intervention:  Problem-solving, Support and exploration  Summary of Progress/Problems: Pt was able to participate in group therapy and explore self sabotaging behaviors and explore new ways of showing hope for the future through self-love and learning to prevent self sabotaging behaviors and thoughts. Pt shared that she prevents herself from progress as she does not set boundaries with others to the point she compromises herself. Pt shared that she is bi-polar and her son was bi-polar but recently passed- pt shared during that time she cared for him but not herself and she is trying to re-learn how to live. Vanetta Mulders, LPCA     Ambry Dix Garret Reddish 11/15/2011, 3:23 PM

## 2011-11-15 NOTE — Progress Notes (Signed)
11/15/2011 10:10 AM                                  Case Management Note                                 Pt attended after care planning group and received suicide prevention, AA and NA meeting information. Pt denies SI/HI.Pt shared her anxiety is high and states she has been taken off her meds for anxiety and is having a hard time dealing with her difficult life issues.

## 2011-11-15 NOTE — Progress Notes (Signed)
Pt quiet but appropriate this shift. Enquired about labs and values explained.  Denies SI. Compliant with scheduled medications  and no prn's requested.  No physical complaints.  Group attendance with minimal participation. Support and encouragement given and 15' checks cont for safety.

## 2011-11-16 MED ORDER — BUSPIRONE HCL 5 MG PO TABS
7.5000 mg | ORAL_TABLET | Freq: Two times a day (BID) | ORAL | Status: DC
  Administered 2011-11-16 – 2011-11-17 (×3): 7.5 mg via ORAL
  Filled 2011-11-16: qty 1
  Filled 2011-11-16 (×2): qty 21
  Filled 2011-11-16 (×4): qty 1
  Filled 2011-11-16: qty 21
  Filled 2011-11-16 (×3): qty 1

## 2011-11-16 NOTE — Progress Notes (Signed)
Writer spoke with patient after AA group. Patient was informed that there were no changes to her hs medications and she was agreeable to that. Patient reported that she was started on buspar today and liked that it seems to help her with her anxiety. Patient reports that she has had a difficult day today thinking about her son off and on. Patient reported that once she found out her husband would not be able to visit her because he over slept it made her evening a little more difficult because she reports that she feels like he really understands what she is dealing with. Patient reports that her husband and father are very supportive and how now that she does not have the alcohol to depend on she is really having to deal with the loss of her son. Patient was very sad and tearful and mentioned that she would like to find a way to speak to young teenagers with mental illness and how important it is for them to take their medication. Patient was supported and encouraged and given ideas of positive things she can do to help deal with her grief. Patient was supported and encouraged. Patient currently denies having pain, -si/hi/a/v hall. Safety maintained on unit, will continue to monitor.

## 2011-11-16 NOTE — Progress Notes (Signed)
Patient ID: Kristina Huffman, female   DOB: [birth year], 51 y.o.   MRN: [id number]  Pt. attended and participated in aftercare planning group. Pt. listed their current anxiety level as an 8 and her depression is a 7 on a scale of 1 to 10. Pt denies S/I or H/I. Kristina Huffman stated that she wants to be D/C soon because she does not feel that her care from staff is adequate and that she is not on the right detox medications.

## 2011-11-16 NOTE — Progress Notes (Signed)
Pt is out in milieu interacting with peers ans attending groups with good participation.  Rates depression and hopelessness at 8 with low energy,fair sleep and improving appetite.  Denies SI and contracts for safety.  Shared substance goodbye letter with group and was engaged in conversation with good awareness.   No physical complaints but continues to c/o "jumping out of my skin" and requested and received. Ongoing support offered and 15' checks cont for safety.

## 2011-11-16 NOTE — Progress Notes (Signed)
Patient ID: Kristina Huffman, female   DOB: 01/08/1961, 51 y.o.   MRN: [id number]  [facility] Group Notes:  (Counselor/Nursing/MHT/Case Management/Adjunct)  11/16/2011 1:15 PM  Type of Therapy:  Group Therapy, Dance/Movement Therapy   Participation Level:  Active  Participation Quality:  Appropriate  Affect:  Appropriate  Cognitive:  Appropriate  Insight:  Good  Engagement in Group:  Good  Engagement in Therapy:  Good  Modes of Intervention:  Clarification, Problem-solving, Role-play, Socialization and Support  Summary of Progress/Problems: Therapist discussed with group the definition of healthy supports. Therapist asked group what does healthy supports mean to them Pt. stated that a healthy support means " going to a treatment center and living in a structured environment ".  Rhunette Croft
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
## 2011-11-17 DIAGNOSIS — F1320 Sedative, hypnotic or anxiolytic dependence, uncomplicated: Secondary | ICD-10-CM

## 2011-11-17 MED ORDER — LITHIUM CARBONATE 300 MG PO CAPS
300.0000 mg | ORAL_CAPSULE | Freq: Every day | ORAL | Status: DC
  Filled 2011-11-17: qty 21
  Filled 2011-11-17 (×2): qty 1

## 2011-11-17 MED ORDER — LORAZEPAM 1 MG PO TABS
1.0000 mg | ORAL_TABLET | Freq: Three times a day (TID) | ORAL | Status: DC | PRN
  Administered 2011-11-17: 1 mg via ORAL
  Filled 2011-11-17: qty 1

## 2011-11-17 MED ORDER — TRAZODONE HCL 100 MG PO TABS
100.0000 mg | ORAL_TABLET | Freq: Every day | ORAL | Status: DC
  Administered 2011-11-17: 100 mg via ORAL
  Filled 2011-11-17: qty 1
  Filled 2011-11-17: qty 7
  Filled 2011-11-17: qty 1
  Filled 2011-11-17: qty 7

## 2011-11-17 MED ORDER — LITHIUM CARBONATE 300 MG PO CAPS
600.0000 mg | ORAL_CAPSULE | Freq: Every day | ORAL | Status: DC
  Administered 2011-11-17: 600 mg via ORAL
  Filled 2011-11-17 (×3): qty 2

## 2011-11-17 NOTE — Progress Notes (Signed)
[facility] Group Notes:  (Counselor/Nursing/MHT/Case Management/Adjunct)  11/17/2011 3:08 PM  Type of Therapy:  Group Therapy  Participation Level:  Minimal  Participation Quality:  Attentive  Affect:  Appropriate  Cognitive:  Oriented  Insight:  Limited  Engagement in Group:  Limited  Engagement in Therapy:  Limited  Modes of Intervention:  Clarification, Socialization and Support  Summary of Progress/Problems:Patient shared that her biggest obstacle to recovery after discharge will probably be ''isolation, financial needs and living arrangement which kinda set the stage for everything else."  Patient appeared restless and uneasy during group yet declined to share when given additional opportunities   Clide Dales 11/17/2011, 3:08 PM   [facility] Group Notes:  (Counselor/Nursing/MHT/Case Management/Adjunct)  11/17/2011 3:08 PM  Type of Therapy:  Group Therapy  Participation Level:  Minimal  Participation Quality:  Attentive; patient was attentive to [facility] presentation of services offered     Clide Dales 11/17/2011, 3:08 PM

## 2011-11-17 NOTE — Progress Notes (Signed)
[facility] MD Progress Note  11/17/2011 3:03 PM  S/O:  Patient seen and evaluated in team and chart reviewed. Patient stated that her mood was "ok, but still with ups and downs". Her affect was mood congruent and anxious. She denied any current thoughts of self injurious behavior, suicidal ideation or homicidal ideation. There were no auditory or visual hallucinations, paranoia, or delusional thought processes noted. Thought process was linear and goal directed. Psychomotor agitation improved. Speech was normal rate, tone and volume. Eye contact was good. Judgment and insight are fair. Patient has been up and engaged on the unit. No acute safety concerns reported from team.  Request for increase in trazodone for sleep.  Sleep:  Number of Hours: 6.25    Vital Signs:Blood pressure 125/85, pulse 84, temperature 98.4 F (36.9 C), temperature source Oral, resp. rate 17, last menstrual period 09/15/2011, SpO2 99.00%.  Current Medications:    . azithromycin  250 mg Oral Daily  . busPIRone  7.5 mg Oral BID  . dextromethorphan-guaiFENesin  1 tablet Oral BID  . lithium carbonate  300 mg Oral BID WC  . thiamine  100 mg Intramuscular Once  . thiamine  100 mg Oral Daily  . traZODone  50 mg Oral QHS    Lab Results: No results found for this or any previous visit (from the past 48 hour(s)).  Physical Findings: CIWA:  CIWA-Ar Total: 0   A/P: Alcohol and Benzodiazepine Dependence; Bipolar Disorder NOS; Complicated Bereavement; PTSD   Treatment goals and medication management reviewed with patient.  Continue current treatment plan and medications with increase in lithium & trazodone.  No SEs reported at current dosages.  Lithium at 300/600 during last admission.  Discussed with pt and team.  Potential discharge Weds. to [place].  Pt agreeable with plan if stable on meds.  Lupe Carney 11/17/2011, 3:03 PM

## 2011-11-17 NOTE — Progress Notes (Signed)
Pt. Reports that she has a high tolerance when it comes to medications. Pt. Had questions about Buspar.  Pt.  Given . Education sheets on Buspar.

## 2011-11-17 NOTE — Treatment Plan (Addendum)
Interdisciplinary Treatment Plan Update (Adult)  Date: 11/17/2011  Time Reviewed: 3:41 PM   Progress in Treatment: Attending groups: Yes Participating in groups: Yes Taking medication as prescribed: Yes Tolerating medication: Yes   Family/Significant othe contact made: Counselor to contact if suicide prevention info is needed  Patient understands diagnosis:  Yes  As evidenced by asking for help with alcohol and benzo dependence, and anxiety Discussing patient identified problems/goals with staff:  Yes  See below Medical problems stabilized or resolved:  Yes Denies suicidal/homicidal ideation: Yes  In tx team Issues/concerns per patient self-inventory:  Yes c/o overwhelming anxiety, requesting meds  Depression a 5 Other:  New problem(s) identified: N/A  Reason for Continuation of Hospitalization: Anxiety Medication stabilization  Interventions implemented related to continuation of hospitalization: Medication adjustment [increase lithium today]  Encourage group attendance and participation for anxiety and to increase coping skills  Additional comments:  Estimated length of stay:1-2 days  Discharge Plan:[facility]  New goal(s): N/A  Review of initial/current patient goals per problem list:   1.  Goal(s):Safely detxo from alcohol, benzos  Met:  Yes  Target date:2/23  As evidenced ZO:XWRUEAVW in CIWA score to 0  2.  Goal (s):Decrease depression and anxiety  Met:  No  Target date:2/26  As evidenced UJ:WJXBJYN will report that her depression and anxiety both feel manageable to her as she prepares for d/c  3.  Goal(s):Get into rehab from here  Met:  No  Target date:2/26  As evidenced WG:[id number] of bed at [facility]   4.  Goal(s):Eliminate SI  Met:  Yes  Target date:2/20  As evidenced IO:NGEX report  Attendees: Patient:  Kristina Huffman 11/17/2011 3:41 PM  Family:     Physician:  Lupe Carney 11/17/2011 3:41 PM   Nursing:  Carolynn Comment  11/17/2011  3:41 PM   Case Manager:  Richelle Ito, LCSW 11/17/2011 3:41 PM   Counselor:  Ronda Fairly, LCSWA 11/17/2011 3:41 PM   Other:     Other:     Other:     Other:      Scribe for Treatment Team:   Ida Rogue, 11/17/2011 3:41 PM

## 2011-11-18 DIAGNOSIS — F4310 Post-traumatic stress disorder, unspecified: Secondary | ICD-10-CM

## 2011-11-18 DIAGNOSIS — F3160 Bipolar disorder, current episode mixed, unspecified: Secondary | ICD-10-CM

## 2011-11-18 MED ORDER — MELATONIN 3 MG PO TABS: 3.0000 mg | ORAL_TABLET | Freq: Every day | ORAL | Status: DC

## 2011-11-18 MED ORDER — LITHIUM CARBONATE 300 MG PO CAPS: ORAL_CAPSULE | ORAL | Status: DC

## 2011-11-18 MED ORDER — BUSPIRONE HCL 7.5 MG PO TABS: 7.5000 mg | ORAL_TABLET | Freq: Two times a day (BID) | ORAL | Status: DC

## 2011-11-18 MED ORDER — TRAZODONE HCL 100 MG PO TABS: 100.0000 mg | ORAL_TABLET | Freq: Every day | ORAL | Status: DC

## 2011-11-18 NOTE — Treatment Plan (Signed)
Interdisciplinary Treatment Plan Update (Adult)  Date: 11/18/2011  Time Reviewed: 9:53 AM   Progress in Treatment: Attending groups: Yes Participating in groups: Yes Taking medication as prescribed: Yes Tolerating medication: Yes   Family/Significant othe contact made:   Patient understands diagnosis:  Yes   Discussing patient identified problems/goals with staff:  Yes Medical problems stabilized or resolved:  Yes Denies suicidal/homicidal ideation: Yes  In tx team Issues/concerns per patient self-inventory:  None noted Other:  New problem(s) identified: N/A  Reason for Continuation of Hospitalization: Other; describe D/C today  Interventions implemented related to continuation of hospitalization:   Additional comments:  Estimated length of stay:D/C today  Discharge Plan:Return home  Follow up oupt  New goal(s): N/A  Review of initial/current patient goals per problem list:   1.  Goal(s):Safely detox from alcohol, benzos  Met:  Yes  Target date:2/23  As evidenced ZO:XWRUEAVW in CIWA   2.  Goal (s):Decrease depression and anxiety  Met:  No  Target date:2/26  As evidenced by: Not met.  Kristina Huffman still has high depression due to bereavement issues  Says it is manageable if she gets into therapy with hospice  She was given phone number for them   3.  Goal(s):Eliminate SI   Met:  Yes  Target date:2/20  As evidenced UJ:WJXB report  4.  Goal(s):Refer to rehab  Met:  No  Target date:2/26  As evidenced JY:NWGNFAO her mind  Decided to go home and follow up outpt  Attendees: Patient: Kristina Huffman  11/18/2011 9:53 AM  Family:     Physician:  Lupe Carney 11/18/2011 9:53 AM   Nursing:  Carolynn Comment  11/18/2011 9:53 AM   Case Manager:  Richelle Ito, LCSW 11/18/2011 9:53 AM   Counselor:  Ronda Fairly, LCSWA 11/18/2011 9:53 AM   Other:     Other:     Other:     Other:      Scribe for Treatment Team:   Ida Rogue, 11/18/2011 9:53 AM

## 2011-11-18 NOTE — Progress Notes (Signed)
Resting quietly with eyes closed. Respirations even and unlabored. No distress noted.  
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
## 2011-11-18 NOTE — Progress Notes (Signed)
Pt states she slept fair, appetite poor, energy level low, focus is improving. Pt rates her depression as a 3, and hopelessness as a 4. Pt states she is having agitation from detox. Pt denies SI/HI. Pt c/o  "cramps in back and side painful." Pt states she feels "very confused about medication changes, and can't understand why I can't have Vistaril." Pt having a difficult time of detoxing off Ativan. MD aware, will speak with pt today.

## 2011-11-18 NOTE — Progress Notes (Signed)
Salathiel Ferrara [facility] Case Management Discharge Plan:  Will you be returning to the same living situation after discharge: Yes,  home At discharge, do you have transportation home?:Yes,  husband Do you have the ability to pay for your medications:Yes,  mental health and $4 prescriptions  Interagency Information:     Release of information consent forms completed and in the chart;  Patient's signature needed at discharge.  Patient to Follow up at:  Follow-up Information    Follow up with [facility]. (Call for information and to make an appointment)    Contact information:   [address]  [phone]      Follow up with [facility] on 11/20/2011. (Walk in at 8AM to see the Dr)    Benay Pillow information:   [address]  [336] [phone]         Patient denies SI/HI:   Yes,  yes    Safety Planning and Suicide Prevention discussed:  Yes,  yes  Barrier to discharge identified:No.  Summary and Recommendations:   Ida Rogue 11/18/2011, 11:46 AM

## 2011-11-18 NOTE — Progress Notes (Signed)
[facility] Group Notes:  (Counselor/Nursing/MHT/Case Management/Adjunct)  11/18/2011 1:32 PM  Type of Therapy:  Group Therapy  Participation Level:  Minimal  Participation Quality:  Appropriate, Attentive, Sharing and Supportive  Affect:  Anxious and Depressed  Cognitive:  Alert and Oriented  Insight:  Limited  Engagement in Group:  Limited  Engagement in Therapy:  Limited  Modes of Intervention:  Clarification, Education, Socialization and Support  Summary of Progress/Problems:[name] shared that she dislikes label of addiction and feels it signifies negative qualities. Patient also was quick to state everything has been prescribed to me and why should I worry if a physician prescribes it to me? Patient found examples of someone with an allergy ridiculous and stated she felt facilitator was talking down to group.    Clide Dales 11/18/2011, 1:32 PM

## 2011-11-18 NOTE — Progress Notes (Signed)
Patient ID: Kristina Huffman, female   DOB: 03-02-1961, 51 y.o.   MRN: [id number] Phone call to [facility] [phone] where patient's last Clonazepam Rx was filled on 10/06/2011 for Clonazepam 2 mg #90.  No refills are available.  Called to cancel refills at request of Dr. Moises Blood.

## 2011-11-21 NOTE — Progress Notes (Signed)
Patient Discharge Instructions:  Patient refused to give consent for [facility].  Wandra Scot, 11/21/2011, 12:40 PM

## 2011-11-27 NOTE — Discharge Summary (Signed)
Physician Discharge Summary Note  Patient:  Kristina Huffman is an 51 y.o., female MRN:  [id number] DOB:  Jun 26, 1961 Patient phone:  [phone] (home)  Patient address:   [address] [place] [address],   Date of Admission:  11/11/2011 Date of Discharge: 11/18/2011   Discharge Diagnoses: Principal Problem:  *Alcohol dependency Active Problems:  URI (upper respiratory infection)   Axis Diagnosis:   AXIS I:  Alcohol Dependence; Bipolar Disorder, mixed state; Benzodiazepine dependence AXIS II:  deferred AXIS III:  URI - resolved Past Medical History  Diagnosis Date  . Depression   . COPD (chronic obstructive pulmonary disease)   . Asthma    AXIS IV:  Grief and loss issues AXIS V:  61-70 mild symptoms  Level of Care:  OP  Hospital Course:  [place] presented requesting detox from alcohol. She previously been on our unit in December of 2012 and was discharged to the [facility] rehab program. She left the [facility] program early and subsequently relapsed on alcohol and resumed the use of Klonopin in early January 2013. She denied suicidal thoughts.  She was admitted to our detox program and detoxed with Ativan detox protocol, which was uneventful. She did admit to depression and emotional difficulties in dealing with the death of her son. Her lithium and Effexor, which were her home medications, were resumed. After completing her detox, she made a decision that she would resume the use of Klonopin against medical advice.  After discussion of risks and benefits, she elected to sign herself out of our program against medical advice after It was recommended that she should stay for treatment to consider nonaddictive options to the Klonopin. At no time did she meet criteria for involuntary commitment and did not have suicidal thoughts.  Consults:  None  Significant Diagnostic Studies:  AST 66; ALT 84; Alk Phos 117; Total Bili .9. Initial Lithium Level <.25.   Discharge Vitals:   Blood  pressure 126/83, pulse 93, temperature 96.8 F (36 C), temperature source Oral, resp. rate 20, last menstrual period 09/15/2011, SpO2 99.00%.  Mental Status Exam: See Mental Status Examination and Suicide Risk Assessment completed by Attending Physician prior to discharge.  Discharge destination:  Home  Is patient on multiple antipsychotic therapies at discharge:  No   Has Patient had three or more failed trials of antipsychotic monotherapy by history:  No  Recommended Plan for Multiple Antipsychotic Therapies: N/A   Medication List  As of 11/27/2011  8:04 AM   STOP taking these medications         clonazePAM 2 MG tablet      FLUoxetine 20 MG tablet         TAKE these medications      Indication    busPIRone 7.5 MG tablet   Commonly known as: BUSPAR   Take 1 tablet (7.5 mg total) by mouth 2 (two) times daily. For anxiety.       lithium carbonate 300 MG capsule   Take one cap (300mg ) daily in morning, and 2 caps (600mg ) daily in evening.  Take with food or milk to avoid GI upset.       Melatonin 3 MG Tabs   Take 1 tablet (3 mg total) by mouth at bedtime. Helps with sleep.       traZODone 100 MG tablet   Commonly known as: DESYREL   Take 1 tablet (100 mg total) by mouth at bedtime. For sleep.  Follow-up Information    Follow up with [facility]. (Call for information and to make an appointment)    Contact information:   [address]  [phone]      Follow up with [facility] on 11/20/2011. (Walk in at 8AM to see the Dr)    [facility] Pillow information:   [address]  [phone]         Follow-up recommendations:  Activity:  unrestricted Diet:  regular   Signed: Surah Pelley A 11/27/2011, 8:04 AM

## 2011-12-27 ENCOUNTER — Emergency Department (HOSPITAL_COMMUNITY)
Admission: EM | Admit: 2011-12-27 | Discharge: 2011-12-27 | Disposition: A | Discharge status: Home / Self Care | Payer: Self-pay | Attending: Emergency Medicine | Admitting: Emergency Medicine

## 2011-12-27 DIAGNOSIS — Z008 Encounter for other general examination: Secondary | ICD-10-CM | POA: Insufficient documentation

## 2012-05-17 ENCOUNTER — Emergency Department (HOSPITAL_COMMUNITY)
Admission: EM | Admit: 2012-05-17 | Discharge: 2012-05-17 | Disposition: A | Discharge status: Home / Self Care | Payer: Self-pay | Attending: Emergency Medicine | Admitting: Emergency Medicine

## 2012-05-17 ENCOUNTER — Encounter (HOSPITAL_COMMUNITY): Payer: Self-pay | Admitting: Emergency Medicine

## 2012-05-17 DIAGNOSIS — M545 Low back pain, unspecified: Secondary | ICD-10-CM | POA: Insufficient documentation

## 2012-05-17 DIAGNOSIS — M79609 Pain in unspecified limb: Secondary | ICD-10-CM | POA: Insufficient documentation

## 2012-05-17 MED ORDER — DIAZEPAM 5 MG/ML IJ SOLN
5.0000 mg | Freq: Once | INTRAMUSCULAR | Status: AC
  Administered 2012-05-17: 5 mg via INTRAVENOUS
  Filled 2012-05-17: qty 2

## 2012-05-17 MED ORDER — ACETAMINOPHEN-CODEINE #3 300-30 MG PO TABS: 1.0000 | ORAL_TABLET | Freq: Four times a day (QID) | ORAL | Status: AC | PRN

## 2012-05-17 MED ORDER — KETOROLAC TROMETHAMINE 60 MG/2ML IM SOLN
60.0000 mg | Freq: Once | INTRAMUSCULAR | Status: AC
  Administered 2012-05-17: 60 mg via INTRAMUSCULAR
  Filled 2012-05-17: qty 2

## 2012-05-17 MED ORDER — DIAZEPAM 5 MG PO TABS: 5.0000 mg | ORAL_TABLET | Freq: Two times a day (BID) | ORAL | Status: AC
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
## 2012-05-18 ENCOUNTER — Encounter (HOSPITAL_COMMUNITY): Payer: Self-pay | Admitting: Emergency Medicine

## 2012-05-18 ENCOUNTER — Emergency Department (HOSPITAL_COMMUNITY)
Admission: EM | Admit: 2012-05-18 | Discharge: 2012-05-18 | Disposition: A | Discharge status: Home / Self Care | Payer: Self-pay | Attending: Emergency Medicine | Admitting: Emergency Medicine

## 2012-05-18 DIAGNOSIS — F1910 Other psychoactive substance abuse, uncomplicated: Secondary | ICD-10-CM

## 2012-05-18 DIAGNOSIS — Z882 Allergy status to sulfonamides status: Secondary | ICD-10-CM | POA: Insufficient documentation

## 2012-05-18 DIAGNOSIS — F1020 Alcohol dependence, uncomplicated: Secondary | ICD-10-CM | POA: Insufficient documentation

## 2012-05-18 DIAGNOSIS — F1120 Opioid dependence, uncomplicated: Secondary | ICD-10-CM | POA: Insufficient documentation

## 2012-05-18 DIAGNOSIS — Z8619 Personal history of other infectious and parasitic diseases: Secondary | ICD-10-CM | POA: Insufficient documentation

## 2012-05-18 DIAGNOSIS — F3289 Other specified depressive episodes: Secondary | ICD-10-CM | POA: Insufficient documentation

## 2012-05-18 DIAGNOSIS — J45909 Unspecified asthma, uncomplicated: Secondary | ICD-10-CM | POA: Insufficient documentation

## 2012-05-18 DIAGNOSIS — F329 Major depressive disorder, single episode, unspecified: Secondary | ICD-10-CM | POA: Insufficient documentation

## 2012-05-18 HISTORY — DX: Alcohol abuse, uncomplicated: F10.10

## 2012-05-18 HISTORY — DX: Unspecified viral hepatitis C without hepatic coma: B19.20

## 2012-05-18 HISTORY — DX: Opioid abuse, uncomplicated: F11.10

## 2012-05-18 LAB — CBC WITH DIFFERENTIAL/PLATELET
Basophils Absolute: 0 10*3/uL (ref 0.0–0.1)
Basophils Relative: 1 % (ref 0–1)
Eosinophils Absolute: 0.1 10*3/uL (ref 0.0–0.7)
Eosinophils Relative: 2 % (ref 0–5)
HCT: 44 % (ref 36.0–46.0)
Hemoglobin: 15.1 g/dL — ABNORMAL HIGH (ref 12.0–15.0)
Lymphocytes Relative: 34 % (ref 12–46)
Lymphs Abs: 1.9 10*3/uL (ref 0.7–4.0)
MCH: 31.9 pg (ref 26.0–34.0)
MCHC: 34.3 g/dL (ref 30.0–36.0)
MCV: 93 fL (ref 78.0–100.0)
Monocytes Absolute: 0.5 10*3/uL (ref 0.1–1.0)
Monocytes Relative: 8 % (ref 3–12)
Neutro Abs: 3 10*3/uL (ref 1.7–7.7)
Neutrophils Relative %: 55 % (ref 43–77)
Platelets: 254 10*3/uL (ref 150–400)
RBC: 4.73 MIL/uL (ref 3.87–5.11)
RDW: 13.1 % (ref 11.5–15.5)
WBC: 5.5 10*3/uL (ref 4.0–10.5)

## 2012-05-18 LAB — COMPREHENSIVE METABOLIC PANEL
ALT: 122 U/L — ABNORMAL HIGH (ref 0–35)
AST: 100 U/L — ABNORMAL HIGH (ref 0–37)
Albumin: 3.9 g/dL (ref 3.5–5.2)
Alkaline Phosphatase: 88 U/L (ref 39–117)
BUN: 19 mg/dL (ref 6–23)
CO2: 21 mEq/L (ref 19–32)
Calcium: 9.6 mg/dL (ref 8.4–10.5)
Chloride: 105 mEq/L (ref 96–112)
Creatinine, Ser: 0.89 mg/dL (ref 0.50–1.10)
GFR calc Af Amer: 87 mL/min — ABNORMAL LOW (ref >90)
GFR calc non Af Amer: 75 mL/min — ABNORMAL LOW (ref >90)
Glucose, Bld: 116 mg/dL — ABNORMAL HIGH (ref 70–99)
Potassium: 4.4 mEq/L (ref 3.5–5.1)
Sodium: 140 mEq/L (ref 135–145)
Total Bilirubin: 0.5 mg/dL (ref 0.3–1.2)
Total Protein: 7.4 g/dL (ref 6.0–8.3)

## 2012-05-18 LAB — RAPID URINE DRUG SCREEN, HOSP PERFORMED
Amphetamines: NOT DETECTED
Barbiturates: NOT DETECTED
Benzodiazepines: POSITIVE — AB
Cocaine: POSITIVE — AB
Opiates: POSITIVE — AB
Tetrahydrocannabinol: NOT DETECTED

## 2012-05-18 LAB — ETHANOL: Alcohol, Ethyl (B): 87 mg/dL — ABNORMAL HIGH (ref 0–11)

## 2012-05-18 MED ORDER — ONDANSETRON HCL 4 MG PO TABS: 4.0000 mg | ORAL_TABLET | Freq: Three times a day (TID) | ORAL | Status: DC | PRN

## 2012-05-18 MED ORDER — NICOTINE 21 MG/24HR TD PT24: 21.0000 mg | MEDICATED_PATCH | Freq: Every day | TRANSDERMAL | Status: DC

## 2012-05-18 MED ORDER — ACETAMINOPHEN 325 MG PO TABS: 650.0000 mg | ORAL_TABLET | ORAL | Status: DC | PRN

## 2012-05-18 MED ORDER — ALUM & MAG HYDROXIDE-SIMETH 200-200-20 MG/5ML PO SUSP: 30.0000 mL | ORAL | Status: DC | PRN
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
## 2012-05-18 NOTE — ED Provider Notes (Addendum)
History     CSN: [id number]  Arrival date & time 05/18/12  1431   First MD Initiated Contact with Patient 05/18/12 1549      Chief Complaint  Patient presents with  . heroin detox    . etoh detox   . Medical Clearance    (Consider location/radiation/quality/duration/timing/severity/associated sxs/prior treatment) The history is provided by the patient.  pt here requesting medical clearence for heroin and alcohol use. Patient is currently in intensive outpatient therapy and had a relapse today were to use heroin and drink wine. Denies any suicidal or homicidal ideations. Patient has a bed at [facility] and is here for medical clearance. She denies any other complaints of  Past Medical History  Diagnosis Date  . Depression   . Asthma   . Hepatitis C   . Heroin abuse   . ETOH abuse     Past Surgical History  Procedure Date  . Back surgery     No family history on file.  History  Substance Use Topics  . Smoking status: Never Smoker   . Smokeless tobacco: Not on file  . Alcohol Use: Yes     1 bottle of wine/day    OB History    Grav Para Term Preterm Abortions TAB SAB Ect Mult Living                  Review of Systems  All other systems reviewed and are negative.    Allergies  Sulfa antibiotics; Aspirin; Effexor; and Lithium  Home Medications   Current Outpatient Rx  Name Route Sig Dispense Refill  . ACETAMINOPHEN-CODEINE #3 300-30 MG PO TABS Oral Take 1-2 tablets by mouth every 6 (six) hours as needed for pain. 15 tablet 0  . DIAZEPAM 5 MG PO TABS Oral Take 1 tablet (5 mg total) by mouth 2 (two) times daily. 10 tablet 0  . ADULT MULTIVITAMIN W/MINERALS CH Oral Take 1 tablet by mouth daily.      BP 121/76  Pulse 98  Temp 98.3 F (36.8 C) (Oral)  Resp 16  SpO2 95%  LMP 05/11/2012  Physical Exam  Nursing note and vitals reviewed. Constitutional: She is oriented to person, place, and time. She appears well-developed and well-nourished.  Non-toxic  appearance. No distress.  HENT:  Head: Normocephalic and atraumatic.  Eyes: Conjunctivae, EOM and lids are normal. Pupils are equal, round, and reactive to light.  Neck: Normal range of motion. Neck supple. No tracheal deviation present. No mass present.  Cardiovascular: Normal rate, regular rhythm and normal heart sounds.  Exam reveals no gallop.   No murmur heard. Pulmonary/Chest: Effort normal and breath sounds normal. No stridor. No respiratory distress. She has no decreased breath sounds. She has no wheezes. She has no rhonchi. She has no rales.  Abdominal: Soft. Normal appearance and bowel sounds are normal. She exhibits no distension. There is no tenderness. There is no rebound and no CVA tenderness.  Musculoskeletal: Normal range of motion. She exhibits no edema and no tenderness.  Neurological: She is alert and oriented to person, place, and time. She has normal strength. No cranial nerve deficit or sensory deficit. GCS eye subscore is 4. GCS verbal subscore is 5. GCS motor subscore is 6.  Skin: Skin is warm and dry. No abrasion and no rash noted.  Psychiatric: Her speech is normal and behavior is normal. Her affect is blunt. She expresses no homicidal and no suicidal ideation.    ED Course  Procedures (including critical  care time)  Labs Reviewed  URINE RAPID DRUG SCREEN (HOSP PERFORMED) - Abnormal; Notable for the following:    Opiates POSITIVE (*)     Cocaine POSITIVE (*)     Benzodiazepines POSITIVE (*)     All other components within normal limits  CBC WITH DIFFERENTIAL  COMPREHENSIVE METABOLIC PANEL  ETHANOL   No results found.   No diagnosis found.    MDM  Patient and cleared and then sent to [facility]        Toy Baker, MD 05/18/12 1606  7:31 PM Patient seem to act team and they are unable to place. Patient requests to leave at this time  Toy Baker, MD 05/18/12 1931

## 2012-05-20 NOTE — ED Provider Notes (Signed)
History     CSN: [id number]  Arrival date & time 05/18/12  1431   First MD Initiated Contact with Patient 05/18/12 1549      Chief Complaint  Patient presents with  . heroin detox    . etoh detox   . Medical Clearance    (Consider location/radiation/quality/duration/timing/severity/associated sxs/prior treatment) HPI Comments: Patient presents with low back pain. The pain is chronic, as it has been present for years. The patient describes the pain as localized to her lower back and does not radiate. The pain is described as sharp and shooting and made worse with movement. This feel like her typical back pain. She reports mild relief with tylenol. She denies and numbness/tingling of extremities, weakness, loss of bowel or bladder control.    Past Medical History  Diagnosis Date  . Depression   . Asthma   . Hepatitis C   . Heroin abuse   . ETOH abuse     Past Surgical History  Procedure Date  . Back surgery     History reviewed. No pertinent family history.  History  Substance Use Topics  . Smoking status: Never Smoker   . Smokeless tobacco: Not on file  . Alcohol Use: Yes     1 bottle of wine/day    OB History    Grav Para Term Preterm Abortions TAB SAB Ect Mult Living                  Review of Systems  Constitutional: Negative for fever, chills, diaphoresis and fatigue.  HENT: Negative for neck pain and neck stiffness.   Respiratory: Negative for shortness of breath.   Cardiovascular: Negative for chest pain.  Gastrointestinal: Negative for nausea, vomiting and diarrhea.  Genitourinary: Negative for dysuria.  Musculoskeletal: Positive for back pain. Negative for gait problem.  Skin: Negative for rash and wound.  Neurological: Negative for dizziness, weakness, numbness and headaches.    Allergies  Sulfa antibiotics; Aspirin; Effexor; and Lithium  Home Medications   Current Outpatient Rx  Name Route Sig Dispense Refill  . ACETAMINOPHEN-CODEINE #3  300-30 MG PO TABS Oral Take 1-2 tablets by mouth every 6 (six) hours as needed for pain. 15 tablet 0  . DIAZEPAM 5 MG PO TABS Oral Take 1 tablet (5 mg total) by mouth 2 (two) times daily. 10 tablet 0  . ADULT MULTIVITAMIN W/MINERALS CH Oral Take 1 tablet by mouth daily.      BP 113/77  Pulse 92  Temp 97.9 F (36.6 C) (Oral)  Resp 19  SpO2 95%  LMP 05/11/2012  Physical Exam  Nursing note and vitals reviewed. Constitutional: She is oriented to person, place, and time. She appears well-developed and well-nourished. No distress.  HENT:  Head: Normocephalic and atraumatic.  Eyes: Conjunctivae are normal. No scleral icterus.  Neck: Normal range of motion. Neck supple.  Cardiovascular: Normal rate, regular rhythm and intact distal pulses.  Exam reveals no gallop and no friction rub.   No murmur heard. Pulmonary/Chest: Effort normal. No respiratory distress. She has no wheezes. She has no rales. She exhibits no tenderness.  Musculoskeletal: Normal range of motion.       Tenderness to palpation lateral of midline bilaterally of lumbar spine  Neurological: She is alert and oriented to person, place, and time. No cranial nerve deficit.       Strength and sensation equal and intact bilaterally.   Skin: Skin is warm and dry. She is not diaphoretic.  Psychiatric: She has a  normal mood and affect. Her behavior is normal.    ED Course  Procedures (including critical care time)  Labs Reviewed  URINE RAPID DRUG SCREEN (HOSP PERFORMED) - Abnormal; Notable for the following:    Opiates POSITIVE (*)     Cocaine POSITIVE (*)     Benzodiazepines POSITIVE (*)     All other components within normal limits  CBC WITH DIFFERENTIAL - Abnormal; Notable for the following:    Hemoglobin 15.1 (*)     All other components within normal limits  COMPREHENSIVE METABOLIC PANEL - Abnormal; Notable for the following:    Glucose, Bld 116 (*)     AST 100 (*)     ALT 122 (*)     GFR calc non Af Amer 75 (*)      GFR calc Af Amer 87 (*)     All other components within normal limits  ETHANOL - Abnormal; Notable for the following:    Alcohol, Ethyl (B) 87 (*)     All other components within normal limits  LAB REPORT - SCANNED   No results found.   1. Polysubstance abuse       MDM  Patient presents with chronic back pain. I gave her toradol and valium here. I will send her home with Tylenol #3 and valium and follow up with ortho. No recent injury. I will refer to a low cost medical provider from [facility]. No further evaluation needed at this time.         Emilia Beck, [name] 05/20/12 1712

## 2012-05-21 NOTE — ED Provider Notes (Signed)
Medical screening examination/treatment/procedure(s) were performed by non-physician practitioner and as supervising physician I was immediately available for consultation/collaboration.  Morris Longenecker T Lakelyn Straus, MD 05/21/12 2356 

## 2012-08-04 ENCOUNTER — Encounter (HOSPITAL_COMMUNITY): Payer: Self-pay | Admitting: Emergency Medicine

## 2012-08-04 ENCOUNTER — Emergency Department (HOSPITAL_COMMUNITY)
Admission: EM | Admit: 2012-08-04 | Discharge: 2012-08-04 | Disposition: A | Discharge status: Home / Self Care | Payer: Self-pay | Attending: Emergency Medicine | Admitting: Emergency Medicine

## 2012-08-04 ENCOUNTER — Emergency Department (HOSPITAL_COMMUNITY): Payer: Self-pay

## 2012-08-04 DIAGNOSIS — Z8619 Personal history of other infectious and parasitic diseases: Secondary | ICD-10-CM | POA: Insufficient documentation

## 2012-08-04 DIAGNOSIS — F329 Major depressive disorder, single episode, unspecified: Secondary | ICD-10-CM | POA: Insufficient documentation

## 2012-08-04 DIAGNOSIS — S52123A Displaced fracture of head of unspecified radius, initial encounter for closed fracture: Secondary | ICD-10-CM | POA: Insufficient documentation

## 2012-08-04 DIAGNOSIS — J45909 Unspecified asthma, uncomplicated: Secondary | ICD-10-CM | POA: Insufficient documentation

## 2012-08-04 DIAGNOSIS — Z79899 Other long term (current) drug therapy: Secondary | ICD-10-CM | POA: Insufficient documentation

## 2012-08-04 DIAGNOSIS — F1110 Opioid abuse, uncomplicated: Secondary | ICD-10-CM | POA: Insufficient documentation

## 2012-08-04 DIAGNOSIS — F3289 Other specified depressive episodes: Secondary | ICD-10-CM | POA: Insufficient documentation

## 2012-08-04 DIAGNOSIS — Y9301 Activity, walking, marching and hiking: Secondary | ICD-10-CM | POA: Insufficient documentation

## 2012-08-04 DIAGNOSIS — Y9229 Other specified public building as the place of occurrence of the external cause: Secondary | ICD-10-CM | POA: Insufficient documentation

## 2012-08-04 DIAGNOSIS — F1010 Alcohol abuse, uncomplicated: Secondary | ICD-10-CM | POA: Insufficient documentation

## 2012-08-04 DIAGNOSIS — W010XXA Fall on same level from slipping, tripping and stumbling without subsequent striking against object, initial encounter: Secondary | ICD-10-CM | POA: Insufficient documentation

## 2012-08-04 MED ORDER — OXYCODONE-ACETAMINOPHEN 5-325 MG PO TABS: 1.0000 | ORAL_TABLET | ORAL | Status: DC | PRN

## 2012-08-04 MED ORDER — ONDANSETRON 4 MG PO TBDP
4.0000 mg | ORAL_TABLET | Freq: Once | ORAL | Status: AC
  Administered 2012-08-04: 4 mg via ORAL

## 2012-08-04 MED ORDER — OXYCODONE-ACETAMINOPHEN 5-325 MG PO TABS
1.0000 | ORAL_TABLET | Freq: Once | ORAL | Status: AC
  Administered 2012-08-04: 1 via ORAL
  Filled 2012-08-04: qty 1

## 2012-08-04 MED ORDER — ONDANSETRON HCL 4 MG PO TABS
ORAL_TABLET | ORAL | Status: AC
  Filled 2012-08-04: qty 1

## 2012-08-04 MED ORDER — ONDANSETRON 4 MG PO TBDP
ORAL_TABLET | ORAL | Status: AC
  Filled 2012-08-04: qty 1

## 2012-08-04 MED ORDER — IBUPROFEN 600 MG PO TABS: 600.0000 mg | ORAL_TABLET | Freq: Four times a day (QID) | ORAL | Status: DC | PRN

## 2012-08-04 MED ORDER — HYDROMORPHONE HCL PF 1 MG/ML IJ SOLN
1.0000 mg | Freq: Once | INTRAMUSCULAR | Status: AC
  Administered 2012-08-04: 1 mg via INTRAMUSCULAR
  Filled 2012-08-04: qty 1

## 2012-08-04 NOTE — ED Provider Notes (Signed)
History     CSN: [id number]  Arrival date & time 08/04/12  1322   First MD Initiated Contact with Patient 08/04/12 1438      Chief Complaint  Patient presents with  . Arm Injury    (Consider location/radiation/quality/duration/timing/severity/associated sxs/prior treatment) HPI Kristina Huffman is a 51 y.o. female who is here with complaint of a fall and left elbow injury. Pt states she tripped over a mat and fell forward onto right knee and left elbow. Pt states she called EMS because could not move left elbow and no one could drive her over here. Pt states she has no other injuries. Pt denies pain in the right knee. Denies pain in left shoulder or wrist. States fingers feel "tingly."   Past Medical History  Diagnosis Date  . Depression   . Asthma   . Hepatitis C   . Heroin abuse   . ETOH abuse     Past Surgical History  Procedure Date  . Back surgery     History reviewed. No pertinent family history.  History  Substance Use Topics  . Smoking status: Never Smoker   . Smokeless tobacco: Not on file  . Alcohol Use: Yes     Comment: 1 bottle of wine/day    OB History    Grav Para Term Preterm Abortions TAB SAB Ect Mult Living                  Review of Systems  Constitutional: Negative for fever and chills.  Respiratory: Negative.   Cardiovascular: Negative.   Musculoskeletal: Positive for joint swelling and arthralgias.  Neurological: Negative for weakness and numbness.    Allergies  Sulfa antibiotics; Aspirin; Effexor; and Lithium  Home Medications   Current Outpatient Rx  Name  Route  Sig  Dispense  Refill  . CLONAZEPAM 0.5 MG PO TABS   Oral   Take 0.5 mg by mouth 2 (two) times daily.         Marland Kitchen FLUOXETINE HCL 20 MG PO CAPS   Oral   Take 20 mg by mouth daily.         Marland Kitchen CLONAZEPAM 2 MG PO TABS   Oral   Take 2 mg by mouth at bedtime.           BP 102/70  Pulse 70  Temp 98.5 F (36.9 C) (Oral)  Resp 27  SpO2 94%  LMP  06/30/2012  Physical Exam  Nursing note and vitals reviewed. Constitutional: She appears well-developed and well-nourished. No distress.  HENT:  Head: Normocephalic.  Eyes: Conjunctivae normal are normal.  Neck: Neck supple.  Cardiovascular: Normal rate, regular rhythm and normal heart sounds.   Pulmonary/Chest: Effort normal and breath sounds normal. No respiratory distress. She has no wheezes. She has no rales.  Musculoskeletal:       Left elbow swelling and bruising. No open skin. Unable to move due to pain and swelling. Forearm soft. Hand is warm, good cap refill in all fingers <2sec. Full ROM of wrist passively, unable to move actively. Unable to pronate or supinate hand. Pt is able to move her fingers, but has pain with making a fist. Full rom of the thumb. Good radial pulse. Normal shoulder. Normal sensation over all nerve distribution. Normal two pt discrimination on fingers  Neurological: She is alert.  Skin: Skin is warm and dry.  Psychiatric: She has a normal mood and affect.    ED Course  Procedures (including critical care time)  Labs  Reviewed - No data to display Dg Elbow Complete Left  08/04/2012  *RADIOLOGY REPORT*  Clinical Data: Elbow pain status post fall.  LEFT ELBOW - COMPLETE 3+ VIEW  Comparison: None.  Findings: There is a comminuted and displaced intra-articular fracture of the radial head and neck.  The radial head appears shattered and disarticulated from the capitellum.  No definite fracture of the distal humerus or proximal ulna is seen.  There is a moderate sized hemarthrosis.  IMPRESSION: Markedly comminuted fracture of the proximal radius as described with dislocation at the radiocapitellar articulation.   Original Report Authenticated By: Carey Bullocks, M.D.      1. Radial head fracture, closed       MDM  Left proximal radius fracture as described above. Spoke with Dr. Mina Marble, who has reviewed the films. this will require a surgery in the future.  Advised posterior elbow splint, pain meds, elevation at home, ice, follow up with him in the office tomorrow. Pt agreeable with the plan. Shot of dilaudid IM and percocet given in ED. Home with ibuprofen and percocet. Follow up tomorrow. Pt neurovascularly intact. No signs of nerve damage, vascular injury, or compartment syndrome.         Lottie Mussel, PA 08/04/12 [phone]

## 2012-08-04 NOTE — ED Provider Notes (Signed)
Medical screening examination/treatment/procedure(s) were performed by non-physician practitioner and as supervising physician I was immediately available for consultation/collaboration.  Emiko Osorto, MD 08/04/12 1557 

## 2012-08-04 NOTE — ED Notes (Signed)
Pt was walking, tripped over a shoe and landed on her left arm.  No deformity noted.  Pt would not allow EMS to touch arm.  Pain 10/10

## 2012-08-04 NOTE — ED Notes (Signed)
Pt is trying to call for a ride home. Pt was medicated with Dilaudid and is very shaky. Given snack and juice

## 2012-08-04 NOTE — ED Notes (Signed)
"  I was out of my clonopin so I took my prozac then I walked down to the store and I tripped over my shoe.  I fell onto my knees and then onto my arm.  I think I've broken it".  Pt c/o left arm pain.  Pt is hyperventilating in triage.  Pt was asking for pain medication before nurse was able to walk through the door to triage her.  No deformity noted.

## 2012-08-04 NOTE — Progress Notes (Signed)
Pt confirms no pcp Listed with [facility] CM encourage to seek [facility] for pcp Pt confirms Shelly Coss is her [facility] coverage

## 2012-08-06 ENCOUNTER — Encounter (HOSPITAL_COMMUNITY): Payer: Self-pay | Admitting: *Deleted

## 2012-08-06 ENCOUNTER — Other Ambulatory Visit: Payer: Self-pay | Admitting: Orthopedic Surgery

## 2012-08-06 NOTE — Progress Notes (Signed)
Pt reports she has some "spots" on her skin and when she has had these in the past they were MRSA.   Pt has hx of cocaine use, denies recent. States last use at beginning of this year.

## 2012-08-09 ENCOUNTER — Ambulatory Visit (HOSPITAL_COMMUNITY)
Admission: RE | Admit: 2012-08-09 | Discharge: 2012-08-09 | Disposition: A | Discharge status: Home / Self Care | Payer: Self-pay | Source: Ambulatory Visit | Attending: Orthopedic Surgery | Admitting: Orthopedic Surgery

## 2012-08-09 ENCOUNTER — Encounter (HOSPITAL_COMMUNITY): Payer: Self-pay | Admitting: Anesthesiology

## 2012-08-09 ENCOUNTER — Encounter (HOSPITAL_COMMUNITY): Payer: Self-pay | Admitting: *Deleted

## 2012-08-09 ENCOUNTER — Encounter (HOSPITAL_COMMUNITY)
Admission: RE | Disposition: A | Discharge status: Home / Self Care | Payer: Self-pay | Source: Ambulatory Visit | Attending: Orthopedic Surgery

## 2012-08-09 ENCOUNTER — Ambulatory Visit (HOSPITAL_COMMUNITY): Payer: Self-pay | Admitting: Anesthesiology

## 2012-08-09 DIAGNOSIS — K219 Gastro-esophageal reflux disease without esophagitis: Secondary | ICD-10-CM | POA: Insufficient documentation

## 2012-08-09 DIAGNOSIS — W19XXXA Unspecified fall, initial encounter: Secondary | ICD-10-CM | POA: Insufficient documentation

## 2012-08-09 DIAGNOSIS — S52123A Displaced fracture of head of unspecified radius, initial encounter for closed fracture: Secondary | ICD-10-CM | POA: Insufficient documentation

## 2012-08-09 HISTORY — DX: Personal history of Methicillin resistant Staphylococcus aureus infection: Z86.14

## 2012-08-09 HISTORY — PX: RADIAL HEAD ARTHROPLASTY: SHX6044

## 2012-08-09 HISTORY — DX: Anxiety disorder, unspecified: F41.9

## 2012-08-09 LAB — COMPREHENSIVE METABOLIC PANEL
ALT: 60 U/L — ABNORMAL HIGH (ref 0–35)
AST: 47 U/L — ABNORMAL HIGH (ref 0–37)
Albumin: 3.6 g/dL (ref 3.5–5.2)
Alkaline Phosphatase: 91 U/L (ref 39–117)
BUN: 13 mg/dL (ref 6–23)
CO2: 25 mEq/L (ref 19–32)
Calcium: 9.6 mg/dL (ref 8.4–10.5)
Chloride: 100 mEq/L (ref 96–112)
Creatinine, Ser: 0.82 mg/dL (ref 0.50–1.10)
GFR calc Af Amer: >90 mL/min (ref >90)
GFR calc non Af Amer: 83 mL/min — ABNORMAL LOW (ref >90)
Glucose, Bld: 128 mg/dL — ABNORMAL HIGH (ref 70–99)
Potassium: 4.2 mEq/L (ref 3.5–5.1)
Sodium: 136 mEq/L (ref 135–145)
Total Bilirubin: 0.7 mg/dL (ref 0.3–1.2)
Total Protein: 6.8 g/dL (ref 6.0–8.3)

## 2012-08-09 LAB — CBC
HCT: 44 % (ref 36.0–46.0)
Hemoglobin: 14.4 g/dL (ref 12.0–15.0)
MCH: 29.9 pg (ref 26.0–34.0)
MCHC: 32.7 g/dL (ref 30.0–36.0)
MCV: 91.5 fL (ref 78.0–100.0)
Platelets: 281 10*3/uL (ref 150–400)
RBC: 4.81 MIL/uL (ref 3.87–5.11)
RDW: 13 % (ref 11.5–15.5)
WBC: 5.1 10*3/uL (ref 4.0–10.5)

## 2012-08-09 LAB — HCG, SERUM, QUALITATIVE: Preg, Serum: NEGATIVE

## 2012-08-09 LAB — SURGICAL PCR SCREEN
MRSA, PCR: NEGATIVE
Staphylococcus aureus: NEGATIVE

## 2012-08-09 SURGERY — ARTHROPLASTY, RADIUS, HEAD
Anesthesia: General | Site: Elbow | Laterality: Left | Wound class: Clean

## 2012-08-09 MED ORDER — CHLORHEXIDINE GLUCONATE 4 % EX LIQD: 60.0000 mL | Freq: Once | CUTANEOUS | Status: DC

## 2012-08-09 MED ORDER — MUPIROCIN 2 % EX OINT
TOPICAL_OINTMENT | CUTANEOUS | Status: AC
  Administered 2012-08-09: 1 via NASAL
  Filled 2012-08-09: qty 22

## 2012-08-09 MED ORDER — MEPERIDINE HCL 25 MG/ML IJ SOLN: 6.2500 mg | INTRAMUSCULAR | Status: DC | PRN

## 2012-08-09 MED ORDER — OXYCODONE HCL 5 MG/5ML PO SOLN: 5.0000 mg | Freq: Once | ORAL | Status: AC | PRN

## 2012-08-09 MED ORDER — PROMETHAZINE HCL 25 MG/ML IJ SOLN
6.2500 mg | INTRAMUSCULAR | Status: DC | PRN
  Administered 2012-08-09: 6.25 mg via INTRAVENOUS

## 2012-08-09 MED ORDER — PROPOFOL 10 MG/ML IV BOLUS
INTRAVENOUS | Status: DC | PRN
  Administered 2012-08-09: 200 mg via INTRAVENOUS

## 2012-08-09 MED ORDER — FENTANYL CITRATE 0.05 MG/ML IJ SOLN
INTRAMUSCULAR | Status: DC | PRN
  Administered 2012-08-09: 50 ug via INTRAVENOUS
  Administered 2012-08-09: 100 ug via INTRAVENOUS
  Administered 2012-08-09: 150 ug via INTRAVENOUS

## 2012-08-09 MED ORDER — NEOSTIGMINE METHYLSULFATE 1 MG/ML IJ SOLN
INTRAMUSCULAR | Status: DC | PRN
  Administered 2012-08-09: 4 mg via INTRAVENOUS

## 2012-08-09 MED ORDER — MUPIROCIN 2 % EX OINT
TOPICAL_OINTMENT | Freq: Once | CUTANEOUS | Status: AC
  Administered 2012-08-09: 1 via NASAL

## 2012-08-09 MED ORDER — 0.9 % SODIUM CHLORIDE (POUR BTL) OPTIME
TOPICAL | Status: DC | PRN
  Administered 2012-08-09: 1000 mL

## 2012-08-09 MED ORDER — MIDAZOLAM HCL 2 MG/2ML IJ SOLN
INTRAMUSCULAR | Status: AC
  Filled 2012-08-09: qty 2

## 2012-08-09 MED ORDER — ROCURONIUM BROMIDE 100 MG/10ML IV SOLN
INTRAVENOUS | Status: DC | PRN
  Administered 2012-08-09: 50 mg via INTRAVENOUS

## 2012-08-09 MED ORDER — ONDANSETRON HCL 4 MG/2ML IJ SOLN
INTRAMUSCULAR | Status: DC | PRN
  Administered 2012-08-09: 4 mg via INTRAVENOUS

## 2012-08-09 MED ORDER — GLYCOPYRROLATE 0.2 MG/ML IJ SOLN
INTRAMUSCULAR | Status: DC | PRN
  Administered 2012-08-09: .8 mg via INTRAVENOUS

## 2012-08-09 MED ORDER — FENTANYL CITRATE 0.05 MG/ML IJ SOLN
50.0000 ug | INTRAMUSCULAR | Status: DC | PRN
  Administered 2012-08-09: 100 ug via INTRAVENOUS

## 2012-08-09 MED ORDER — MIDAZOLAM HCL 2 MG/2ML IJ SOLN: 0.5000 mg | Freq: Once | INTRAMUSCULAR | Status: DC | PRN

## 2012-08-09 MED ORDER — CEFAZOLIN SODIUM 1-5 GM-% IV SOLN
INTRAVENOUS | Status: AC
  Filled 2012-08-09: qty 100

## 2012-08-09 MED ORDER — ARTIFICIAL TEARS OP OINT
TOPICAL_OINTMENT | OPHTHALMIC | Status: DC | PRN
  Administered 2012-08-09: 1 via OPHTHALMIC

## 2012-08-09 MED ORDER — FENTANYL CITRATE 0.05 MG/ML IJ SOLN
INTRAMUSCULAR | Status: AC
  Filled 2012-08-09: qty 2

## 2012-08-09 MED ORDER — LIDOCAINE HCL (CARDIAC) 20 MG/ML IV SOLN
INTRAVENOUS | Status: DC | PRN
  Administered 2012-08-09: 25 mg via INTRAVENOUS

## 2012-08-09 MED ORDER — HYDROMORPHONE HCL PF 1 MG/ML IJ SOLN: 0.2500 mg | INTRAMUSCULAR | Status: DC | PRN

## 2012-08-09 MED ORDER — OXYCODONE HCL 5 MG PO TABS
ORAL_TABLET | ORAL | Status: AC
  Administered 2012-08-09: 5 mg via ORAL
  Filled 2012-08-09: qty 1

## 2012-08-09 MED ORDER — CEFAZOLIN SODIUM-DEXTROSE 2-3 GM-% IV SOLR
INTRAVENOUS | Status: DC | PRN
  Administered 2012-08-09: 2 g via INTRAVENOUS

## 2012-08-09 MED ORDER — PROMETHAZINE HCL 25 MG/ML IJ SOLN
INTRAMUSCULAR | Status: AC
  Filled 2012-08-09: qty 1

## 2012-08-09 MED ORDER — BUPIVACAINE-EPINEPHRINE PF 0.5-1:200000 % IJ SOLN
INTRAMUSCULAR | Status: DC | PRN
  Administered 2012-08-09: 30 mL

## 2012-08-09 MED ORDER — HYDROMORPHONE HCL 2 MG PO TABS
2.0000 mg | ORAL_TABLET | ORAL | Status: DC | PRN
Start: 2012-08-09 — End: 2012-11-18

## 2012-08-09 MED ORDER — LACTATED RINGERS IV SOLN: INTRAVENOUS | Status: DC

## 2012-08-09 MED ORDER — MIDAZOLAM HCL 2 MG/2ML IJ SOLN
1.0000 mg | INTRAMUSCULAR | Status: DC | PRN
  Administered 2012-08-09: 2 mg via INTRAVENOUS

## 2012-08-09 MED ORDER — LACTATED RINGERS IV SOLN
INTRAVENOUS | Status: DC | PRN
  Administered 2012-08-09 (×2): via INTRAVENOUS

## 2012-08-09 MED ORDER — OXYCODONE HCL 5 MG PO TABS
5.0000 mg | ORAL_TABLET | Freq: Once | ORAL | Status: AC | PRN
  Administered 2012-08-09: 5 mg via ORAL

## 2012-08-09 SURGICAL SUPPLY — 52 items
BANDAGE ELASTIC 3 VELCRO ST LF (GAUZE/BANDAGES/DRESSINGS) IMPLANT
BANDAGE ELASTIC 4 VELCRO ST LF (GAUZE/BANDAGES/DRESSINGS) ×2 IMPLANT
BANDAGE GAUZE ELAST BULKY 4 IN (GAUZE/BANDAGES/DRESSINGS) ×2 IMPLANT
BLADE LONG MED 31X9 (MISCELLANEOUS) ×2 IMPLANT
BLADE MINI RND TIP GREEN BEAV (BLADE) IMPLANT
BNDG CMPR 9X4 STRL LF SNTH (GAUZE/BANDAGES/DRESSINGS) ×1
BNDG COHESIVE 4X5 TAN STRL (GAUZE/BANDAGES/DRESSINGS) ×2 IMPLANT
BNDG ESMARK 4X9 LF (GAUZE/BANDAGES/DRESSINGS) ×2 IMPLANT
CLOTH BEACON ORANGE TIMEOUT ST (SAFETY) ×2 IMPLANT
CORDS BIPOLAR (ELECTRODE) ×2 IMPLANT
COVER MAYO STAND STRL (DRAPES) IMPLANT
COVER SURGICAL LIGHT HANDLE (MISCELLANEOUS) ×2 IMPLANT
CUFF TOURNIQUET SINGLE 18IN (TOURNIQUET CUFF) IMPLANT
CUFF TOURNIQUET SINGLE 24IN (TOURNIQUET CUFF) IMPLANT
DRAPE OEC MINIVIEW 54X84 (DRAPES) IMPLANT
DRAPE SURG 17X23 STRL (DRAPES) ×2 IMPLANT
ELECT NEEDLE TIP 2.8 STRL (NEEDLE) IMPLANT
GAUZE XEROFORM 1X8 LF (GAUZE/BANDAGES/DRESSINGS) ×2 IMPLANT
GLOVE BIO SURGEON STRL SZ8.5 (GLOVE) ×2 IMPLANT
GOWN SRG XL XLNG 56XLVL 4 (GOWN DISPOSABLE) ×1 IMPLANT
GOWN STRL NON-REIN LRG LVL3 (GOWN DISPOSABLE) ×2 IMPLANT
GOWN STRL NON-REIN XL XLG LVL4 (GOWN DISPOSABLE) ×2
IMPL HEAD (Orthopedic Implant) ×1 IMPLANT
IMPL STEM W/SCREW 7X26MM (Stem) ×1 IMPLANT
IMPLANT HEAD (Orthopedic Implant) ×2 IMPLANT
IMPLANT STEM W/SCREW 7X26MM (Stem) ×2 IMPLANT
KIT BASIN OR (CUSTOM PROCEDURE TRAY) ×2 IMPLANT
KIT ROOM TURNOVER OR (KITS) ×2 IMPLANT
LOOP VESSEL MAXI BLUE (MISCELLANEOUS) IMPLANT
MANIFOLD NEPTUNE II (INSTRUMENTS) ×2 IMPLANT
NEEDLE HYPO 25GX1X1/2 BEV (NEEDLE) IMPLANT
NS IRRIG 1000ML POUR BTL (IV SOLUTION) ×2 IMPLANT
PACK ORTHO EXTREMITY (CUSTOM PROCEDURE TRAY) ×2 IMPLANT
PAD ARMBOARD 7.5X6 YLW CONV (MISCELLANEOUS) ×4 IMPLANT
PAD CAST 4YDX4 CTTN HI CHSV (CAST SUPPLIES) ×1 IMPLANT
PADDING CAST COTTON 4X4 STRL (CAST SUPPLIES) ×2
PENCIL BUTTON HOLSTER BLD 10FT (ELECTRODE) IMPLANT
SOLUTION BETADINE 4OZ (MISCELLANEOUS) ×2 IMPLANT
SPONGE GAUZE 4X4 12PLY (GAUZE/BANDAGES/DRESSINGS) ×2 IMPLANT
SPONGE LAP 4X18 X RAY DECT (DISPOSABLE) ×4 IMPLANT
SPONGE SCRUB IODOPHOR (GAUZE/BANDAGES/DRESSINGS) ×2 IMPLANT
STRIP CLOSURE SKIN 1/2X4 (GAUZE/BANDAGES/DRESSINGS) IMPLANT
SUCTION FRAZIER TIP 10 FR DISP (SUCTIONS) IMPLANT
SUT PROLENE 3 0 PS 2 (SUTURE) IMPLANT
SUT VIC AB 2-0 FS1 27 (SUTURE) IMPLANT
SYR BULB 3OZ (MISCELLANEOUS) ×2 IMPLANT
SYR CONTROL 10ML LL (SYRINGE) IMPLANT
TOWEL OR 17X24 6PK STRL BLUE (TOWEL DISPOSABLE) ×2 IMPLANT
TOWEL OR 17X26 10 PK STRL BLUE (TOWEL DISPOSABLE) ×4 IMPLANT
TUBE CONNECTING 12X1/4 (SUCTIONS) IMPLANT
UNDERPAD 30X30 INCONTINENT (UNDERPADS AND DIAPERS) ×2 IMPLANT
WATER STERILE IRR 1000ML POUR (IV SOLUTION) ×2 IMPLANT

## 2012-08-09 NOTE — Preoperative (Signed)
Beta Blockers   Reason not to administer Beta Blockers:Not Applicable 

## 2012-08-09 NOTE — H&P (Signed)
Kristina Huffman is an 51 y.o. female.   Chief Complaint: left elbow pain HPI: as above s/p fall with comminuted left radial head fracture  Past Medical History  Diagnosis Date  . Depression   . Asthma   . Hepatitis C   . Heroin abuse   . ETOH abuse   . Anxiety   . History of MRSA infection     legs and spread to face    Past Surgical History  Procedure Date  . Back surgery     History reviewed. No pertinent family history. Social History:  reports that she has never smoked. She does not have any smokeless tobacco history on file. She reports that she drinks about 3.6 ounces of alcohol per week. She reports that she uses illicit drugs (Cocaine, IV, and Heroin) about 7 times per week.  Allergies:  Allergies  Allergen Reactions  . Sulfa Antibiotics Shortness Of Breath and Swelling    Tight in throat  . Aspirin Other (See Comments)    "Ringing in ears"  . Effexor (Venlafaxine Hydrochloride) Other (See Comments)    headache  . Lithium Other (See Comments)    Headache    Medications Prior to Admission  Medication Sig Dispense Refill  . clonazePAM (KLONOPIN) 0.5 MG tablet Take 0.5 mg by mouth 2 (two) times daily.      . clonazePAM (KLONOPIN) 2 MG tablet Take 2 mg by mouth at bedtime.      Marland Kitchen FLUoxetine (PROZAC) 20 MG capsule Take 20 mg by mouth daily.      Marland Kitchen ibuprofen (ADVIL,MOTRIN) 600 MG tablet Take 1 tablet (600 mg total) by mouth every 6 (six) hours as needed for pain.  30 tablet  0  . oxyCODONE-acetaminophen (PERCOCET) 5-325 MG per tablet Take 1 tablet by mouth every 4 (four) hours as needed for pain.  20 tablet  0    Results for orders placed during the hospital encounter of 08/09/12 (from the past 48 hour(s))  SURGICAL PCR SCREEN     Status: Normal   Collection Time   08/09/12 11:39 AM      Component Value Range Comment   MRSA, PCR NEGATIVE  NEGATIVE    Staphylococcus aureus NEGATIVE  NEGATIVE   COMPREHENSIVE METABOLIC PANEL     Status: Abnormal   Collection Time     08/09/12 12:01 PM      Component Value Range Comment   Sodium 136  135 - 145 mEq/L    Potassium 4.2  3.5 - 5.1 mEq/L    Chloride 100  96 - 112 mEq/L    CO2 25  19 - 32 mEq/L    Glucose, Bld 128 (*) 70 - 99 mg/dL    BUN 13  6 - 23 mg/dL    Creatinine, Ser 6.04  0.50 - 1.10 mg/dL    Calcium 9.6  8.4 - 54.0 mg/dL    Total Protein 6.8  6.0 - 8.3 g/dL    Albumin 3.6  3.5 - 5.2 g/dL    AST 47 (*) 0 - 37 U/L    ALT 60 (*) 0 - 35 U/L    Alkaline Phosphatase 91  39 - 117 U/L    Total Bilirubin 0.7  0.3 - 1.2 mg/dL    GFR calc non Af Amer 83 (*) >90 mL/min    GFR calc Af Amer >90  >90 mL/min   CBC     Status: Normal   Collection Time   08/09/12 12:01 PM  Component Value Range Comment   WBC 5.1  4.0 - 10.5 K/uL    RBC 4.81  3.87 - 5.11 MIL/uL    Hemoglobin 14.4  12.0 - 15.0 g/dL    HCT 40.9  81.1 - 91.4 %    MCV 91.5  78.0 - 100.0 fL    MCH 29.9  26.0 - 34.0 pg    MCHC 32.7  30.0 - 36.0 g/dL    RDW 78.2  95.6 - 21.3 %    Platelets 281  150 - 400 K/uL   HCG, SERUM, QUALITATIVE     Status: Normal   Collection Time   08/09/12 12:01 PM      Component Value Range Comment   Preg, Serum NEGATIVE  NEGATIVE    No results found.  Review of Systems  All other systems reviewed and are negative.    Blood pressure 130/92, pulse 78, temperature 98.1 F (36.7 C), temperature source Oral, resp. rate 18, height 5\' 7"  (1.702 m), weight 92.987 kg (205 lb), last menstrual period 06/30/2012, SpO2 99.00%. Physical Exam  Constitutional: She is oriented to person, place, and time. She appears well-developed and well-nourished.  HENT:  Head: Normocephalic and atraumatic.  Cardiovascular: Normal rate.   Respiratory: Effort normal.  Musculoskeletal:       Left elbow: tenderness found. Radial head tenderness noted.       Arms: Neurological: She is alert and oriented to person, place, and time.  Psychiatric: She has a normal mood and affect. Her behavior is normal. Judgment and thought content  normal.     Assessment/Plan As above   Plan radial head replacement  Shelena Castelluccio A 08/09/2012, 1:45 PM

## 2012-08-09 NOTE — Anesthesia Preprocedure Evaluation (Signed)
Anesthesia Evaluation  Patient identified by MRN, date of birth, ID band Patient awake    Reviewed: Allergy & Precautions, H&P , NPO status , Patient's Chart, lab work & pertinent test results  History of Anesthesia Complications Negative for: history of anesthetic complications  Airway Mallampati: II TM Distance: >3 FB Neck ROM: Full    Dental  (+) Teeth Intact and Dental Advisory Given   Pulmonary neg pulmonary ROS,  breath sounds clear to auscultation  Pulmonary exam normal       Cardiovascular negative cardio ROS  Rhythm:Regular Rate:Normal     Neuro/Psych negative neurological ROS     GI/Hepatic GERD-  Medicated and Controlled,(+)     substance abuse  alcohol use, cocaine use and IV drug use, Hepatitis -, C  Endo/Other  Morbid obesity  Renal/GU negative Renal ROS     Musculoskeletal   Abdominal (+) + obese,   Peds  Hematology   Anesthesia Other Findings   Reproductive/Obstetrics                           Anesthesia Physical Anesthesia Plan  ASA: III  Anesthesia Plan: General   Post-op Pain Management:    Induction: Intravenous  Airway Management Planned: Oral ETT  Additional Equipment:   Intra-op Plan:   Post-operative Plan: Extubation in OR  Informed Consent: I have reviewed the patients History and Physical, chart, labs and discussed the procedure including the risks, benefits and alternatives for the proposed anesthesia with the patient or authorized representative who has indicated his/her understanding and acceptance.   Dental advisory given  Plan Discussed with: CRNA and Surgeon  Anesthesia Plan Comments: (Plan routine monitors, GETA with supraclavicular block for post op analgesia)        Anesthesia Quick Evaluation

## 2012-08-09 NOTE — Transfer of Care (Signed)
Immediate Anesthesia Transfer of Care Note  Patient: Kristina Huffman  Procedure(s) Performed: Procedure(s) (LRB) with comments: RADIAL HEAD ARTHROPLASTY (Left) - Left Radial head Replacement  Patient Location: PACU  Anesthesia Type:General  Level of Consciousness: awake, alert  and oriented  Airway & Oxygen Therapy: Patient Spontanous Breathing and Patient connected to face mask oxygen  Post-op Assessment: Report given to PACU RN, Post -op Vital signs reviewed and stable and Patient moving all extremities X 4  Post vital signs: Reviewed and stable  Complications: No apparent anesthesia complications

## 2012-08-09 NOTE — Anesthesia Procedure Notes (Signed)
Anesthesia Regional Block:  Supraclavicular block  Pre-Anesthetic Checklist: ,, timeout performed, Correct Patient, Correct Site, Correct Laterality, Correct Procedure, Correct Position, site marked, Risks and benefits discussed,  Surgical consent,  Pre-op evaluation,  At surgeon's request and post-op pain management  Laterality: Left  Prep: chloraprep       Needles:  Injection technique: Single-shot  Needle Type: Echogenic Stimulator Needle     Needle Length: 4cm  Needle Gauge: 22 and 22 G    Additional Needles:  Procedures: ultrasound guided (picture in chart) and nerve stimulator Supraclavicular block  Nerve Stimulator or Paresthesia:  Response: forearm twitch, 0.6 mA, 0.1 ms,   Additional Responses:   Narrative:  Start time: 08/09/2012 1:56 PM End time: 08/09/2012 2:01 PM Injection made incrementally with aspirations every 5 mL. Anesthesiologist: Sandford Craze, MD with Fleeta Emmer, MD  Additional Notes: Pt identified in Holding room.  Monitors applied. Working IV access confirmed. Sterile prep L neck.  #22ga ECHOgenic PNS to forearm twitch at 0.47mA threshold with US guidance.  30cc 0.5% Bupivacaine with 1:200k epi injected incrementally after negative test dose.  Patient asymptomatic, VSS, no heme aspirated, tolerated well.  Image printed for medical record.  Sandford Craze, MD   Supraclavicular block

## 2012-08-09 NOTE — Anesthesia Postprocedure Evaluation (Signed)
  Anesthesia Post-op Note  Patient: Kristina Huffman  Procedure(s) Performed: Procedure(s) (LRB) with comments: RADIAL HEAD ARTHROPLASTY (Left) - Left Radial head Replacement  Patient Location: PACU  Anesthesia Type:General  Level of Consciousness: awake, oriented, sedated and patient cooperative  Airway and Oxygen Therapy: Patient Spontanous Breathing  Post-op Pain: mild  Post-op Assessment: Post-op Vital signs reviewed, Patient's Cardiovascular Status Stable, Respiratory Function Stable, Patent Airway, No signs of Nausea or vomiting and Pain level controlled  Post-op Vital Signs: stable  Complications: No apparent anesthesia complications

## 2012-08-09 NOTE — Op Note (Signed)
See dictated note [phone]

## 2012-08-09 NOTE — Brief Op Note (Signed)
08/09/2012  3:53 PM  PATIENT:  Kristina Huffman  51 y.o. female  PRE-OPERATIVE DIAGNOSIS:  Left radial head fracture  POST-OPERATIVE DIAGNOSIS:  Left radial head fracture  PROCEDURE:  Procedure(s) (LRB) with comments: RADIAL HEAD ARTHROPLASTY (Left) - Left Radial head Replacement  SURGEON:  Surgeon(s) and Role:    * Marlowe Shores, MD - Primary  PHYSICIAN ASSISTANT:   ASSISTANTS: none   ANESTHESIA:   general  EBL:  Total I/O In: 1300 [I.V.:1300] Out: 0   BLOOD ADMINISTERED:none  DRAINS: none   LOCAL MEDICATIONS USED:  NONE  SPECIMEN:  No Specimen  DISPOSITION OF SPECIMEN:  N/A  COUNTS:  YES  TOURNIQUET:   Total Tourniquet Time Documented: Upper Arm (Left) - 57 minutes  DICTATION: .Other Dictation: Dictation Number [id number]  PLAN OF CARE: Discharge to home after PACU  PATIENT DISPOSITION:  PACU - hemodynamically stable.   Delay start of Pharmacological VTE agent (>24hrs) due to surgical blood loss or risk of bleeding: not applicable

## 2012-08-10 ENCOUNTER — Ambulatory Visit: Payer: Self-pay | Attending: Orthopedic Surgery | Admitting: *Deleted

## 2012-08-10 ENCOUNTER — Encounter (HOSPITAL_COMMUNITY): Payer: Self-pay | Admitting: Orthopedic Surgery

## 2012-08-10 DIAGNOSIS — M25539 Pain in unspecified wrist: Secondary | ICD-10-CM | POA: Insufficient documentation

## 2012-08-10 DIAGNOSIS — M25639 Stiffness of unspecified wrist, not elsewhere classified: Secondary | ICD-10-CM | POA: Insufficient documentation

## 2012-08-10 DIAGNOSIS — IMO0001 Reserved for inherently not codable concepts without codable children: Secondary | ICD-10-CM | POA: Insufficient documentation

## 2012-08-10 NOTE — Op Note (Signed)
Kristina Huffman, Kristina Huffman NO.:  [id number]  MEDICAL RECORD NO.:  [id number]  LOCATION:  [facility]                         FACILITY:  [facility]  PHYSICIAN:  Artist Pais. Casara Perrier, M.D.DATE OF BIRTH:  11-28-1960  DATE OF PROCEDURE:  08/09/2012 DATE OF DISCHARGE:  08/09/2012                              OPERATIVE REPORT   PREOPERATIVE DIAGNOSIS:  Comminuted left radial head fracture.  POSTOPERATIVE DIAGNOSIS:  Comminuted left radial head fracture.  PROCEDURE:  Left radial head replacement.  SURGEON:  Artist Pais. Mina Marble, M.D.  ASSISTANT:  None.  ANESTHESIA:  General and supraclavicular block.  TOURNIQUET TIME:  No tourniquet time dictated.  COMPLICATION:  No complicating features.  DRAINS:  No drains.  IMPLANTS:  One Biomet evolve, 7 mm stem, 22 mm head.  DESCRIPTION OF THE PROCEDURE:  The patient was taken to the operating suite.  After induction of adequate supraclavicular block and then general endotracheal tube anesthesia, left upper extremity was prepped and draped in the usual sterile fashion.  An Esmarch was used to exsanguinate the limb.  Tourniquet was then inflated to 250 mmHg.  At this point in time, an incision was made at the lateral side of the radial capitellar joint.  Skin was incised sharply.  Interval between the [facility] and [facility] was carefully incised.  Palpation of patellar joint was then undertaken.  Muscle split was then undertaken to get into the radial capitellar joint.  Large amount of hemarthrosis was evacuated and arthrotomy was performed.  The radial head was comminuted in multiple fragments.  These were removed and placed on the back table for sizing of the radial head implant.  A small sagittal saw was then used to complete the neck cut of the radial neck.  Once this was done, using the Biomet evolve instrumentation, the intramedullary canal was rasped to a #7 size trial with a 7 stem and 22 head, and gaining good position and stable with  flexion-extension pronation and supination.  Wound was then thoroughly irrigated and once this was done, the implant was placed without any undue difficulty.  Intraoperative fluoroscopy revealed adequate reduction in AP, lateral, and oblique view.  Stable with flexion-extension pronation and supination.  The deeper layers were closed with 0 Vicryl, then 2-0 undyed Vicryl, and staples on the skin.  Xeroform, 4x4s, and a posterior elbow splint was applied.  The patient tolerated the procedure well and went to recovery room in stable fashion.     Artist Pais Mina Marble, M.D.     MAW/MEDQ  D:  08/09/2012  T:  08/10/2012  Job:  [id number]

## 2012-08-11 ENCOUNTER — Ambulatory Visit: Payer: Self-pay | Admitting: *Deleted

## 2012-08-11 NOTE — Addendum Note (Signed)
Addendum  created 08/11/12 0609 by Tollie Canada F Norrin Shreffler, CRNA   Modules edited:Anesthesia Medication Administration    

## 2012-08-11 NOTE — Addendum Note (Signed)
Addendum  created 08/11/12 0609 by Carmela Rima, CRNA   Modules edited:Anesthesia Medication Administration

## 2012-08-16 ENCOUNTER — Ambulatory Visit: Payer: Self-pay | Admitting: Occupational Therapy

## 2012-08-17 ENCOUNTER — Ambulatory Visit: Payer: Self-pay | Admitting: Occupational Therapy

## 2012-08-25 ENCOUNTER — Ambulatory Visit: Payer: Self-pay | Admitting: *Deleted

## 2012-08-26 ENCOUNTER — Ambulatory Visit: Payer: Self-pay | Attending: Orthopedic Surgery | Admitting: *Deleted

## 2012-08-26 DIAGNOSIS — IMO0001 Reserved for inherently not codable concepts without codable children: Secondary | ICD-10-CM | POA: Insufficient documentation

## 2012-08-26 DIAGNOSIS — M25639 Stiffness of unspecified wrist, not elsewhere classified: Secondary | ICD-10-CM | POA: Insufficient documentation

## 2012-08-26 DIAGNOSIS — M25539 Pain in unspecified wrist: Secondary | ICD-10-CM | POA: Insufficient documentation

## 2012-09-01 ENCOUNTER — Encounter: Payer: Self-pay | Admitting: Occupational Therapy

## 2012-09-10 ENCOUNTER — Ambulatory Visit: Payer: Self-pay | Admitting: Occupational Therapy

## 2012-10-04 ENCOUNTER — Emergency Department (HOSPITAL_COMMUNITY)
Admission: EM | Admit: 2012-10-04 | Discharge: 2012-10-04 | Disposition: A | Discharge status: Home / Self Care | Payer: Self-pay

## 2012-10-04 NOTE — ED Notes (Signed)
Pt reports she will return tomorrow.

## 2012-10-04 NOTE — ED Notes (Signed)
Pt arrives from home c/o swelling to left hand/arm, reports left radial head fracture and surgery to same arm. Swelling started last night. Also c/o swelling to left foot.

## 2012-11-18 ENCOUNTER — Encounter (HOSPITAL_COMMUNITY): Payer: Self-pay | Admitting: Emergency Medicine

## 2012-11-18 ENCOUNTER — Emergency Department (HOSPITAL_COMMUNITY)
Admission: EM | Admit: 2012-11-18 | Discharge: 2012-11-18 | Discharge status: Against Medical Advice | Payer: Self-pay | Attending: Emergency Medicine | Admitting: Emergency Medicine

## 2012-11-18 DIAGNOSIS — F329 Major depressive disorder, single episode, unspecified: Secondary | ICD-10-CM | POA: Insufficient documentation

## 2012-11-18 DIAGNOSIS — F3289 Other specified depressive episodes: Secondary | ICD-10-CM | POA: Insufficient documentation

## 2012-11-18 DIAGNOSIS — J45909 Unspecified asthma, uncomplicated: Secondary | ICD-10-CM | POA: Insufficient documentation

## 2012-11-18 DIAGNOSIS — Z79899 Other long term (current) drug therapy: Secondary | ICD-10-CM | POA: Insufficient documentation

## 2012-11-18 DIAGNOSIS — F411 Generalized anxiety disorder: Secondary | ICD-10-CM | POA: Insufficient documentation

## 2012-11-18 DIAGNOSIS — F17200 Nicotine dependence, unspecified, uncomplicated: Secondary | ICD-10-CM | POA: Insufficient documentation

## 2012-11-18 DIAGNOSIS — IMO0002 Reserved for concepts with insufficient information to code with codable children: Secondary | ICD-10-CM | POA: Insufficient documentation

## 2012-11-18 DIAGNOSIS — Z8614 Personal history of Methicillin resistant Staphylococcus aureus infection: Secondary | ICD-10-CM | POA: Insufficient documentation

## 2012-11-18 DIAGNOSIS — R451 Restlessness and agitation: Secondary | ICD-10-CM

## 2012-11-18 LAB — COMPREHENSIVE METABOLIC PANEL
ALT: 69 U/L — ABNORMAL HIGH (ref 0–35)
AST: 64 U/L — ABNORMAL HIGH (ref 0–37)
Albumin: 3.5 g/dL (ref 3.5–5.2)
Alkaline Phosphatase: 111 U/L (ref 39–117)
BUN: 17 mg/dL (ref 6–23)
CO2: 24 mEq/L (ref 19–32)
Calcium: 9.1 mg/dL (ref 8.4–10.5)
Chloride: 102 mEq/L (ref 96–112)
Creatinine, Ser: 0.87 mg/dL (ref 0.50–1.10)
GFR calc Af Amer: 89 mL/min — ABNORMAL LOW (ref >90)
GFR calc non Af Amer: 77 mL/min — ABNORMAL LOW (ref >90)
Glucose, Bld: 136 mg/dL — ABNORMAL HIGH (ref 70–99)
Potassium: 4 mEq/L (ref 3.5–5.1)
Sodium: 140 mEq/L (ref 135–145)
Total Bilirubin: 0.3 mg/dL (ref 0.3–1.2)
Total Protein: 7.1 g/dL (ref 6.0–8.3)

## 2012-11-18 LAB — CBC WITH DIFFERENTIAL/PLATELET
Basophils Absolute: 0 10*3/uL (ref 0.0–0.1)
Basophils Relative: 0 % (ref 0–1)
Eosinophils Absolute: 0.1 10*3/uL (ref 0.0–0.7)
Eosinophils Relative: 1 % (ref 0–5)
HCT: 43.5 % (ref 36.0–46.0)
Hemoglobin: 15.3 g/dL — ABNORMAL HIGH (ref 12.0–15.0)
Lymphocytes Relative: 37 % (ref 12–46)
Lymphs Abs: 1.9 10*3/uL (ref 0.7–4.0)
MCH: 30.9 pg (ref 26.0–34.0)
MCHC: 35.2 g/dL (ref 30.0–36.0)
MCV: 87.9 fL (ref 78.0–100.0)
Monocytes Absolute: 0.2 10*3/uL (ref 0.1–1.0)
Monocytes Relative: 5 % (ref 3–12)
Neutro Abs: 2.9 10*3/uL (ref 1.7–7.7)
Neutrophils Relative %: 57 % (ref 43–77)
Platelets: 261 10*3/uL (ref 150–400)
RBC: 4.95 MIL/uL (ref 3.87–5.11)
RDW: 14.1 % (ref 11.5–15.5)
WBC: 5.1 10*3/uL (ref 4.0–10.5)

## 2012-11-18 LAB — ETHANOL: Alcohol, Ethyl (B): 223 mg/dL — ABNORMAL HIGH (ref 0–11)

## 2012-11-18 MED ORDER — LORAZEPAM 1 MG PO TABS
1.0000 mg | ORAL_TABLET | Freq: Once | ORAL | Status: AC
  Administered 2012-11-18: 1 mg via ORAL
  Filled 2012-11-18: qty 2

## 2012-11-18 MED ORDER — ONDANSETRON 4 MG PO TBDP: 4.0000 mg | ORAL_TABLET | Freq: Once | ORAL | Status: DC
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
## 2012-11-18 NOTE — ED Notes (Signed)
RN noticed pt was grabbing her belongings and leaving room "I'm done, I'm leaving. How do I get out of here?"  RN attempted to talk pt down and ask her to return to room but pt was not listening to this RN and left department.  PA made aware of pt's elopement.

## 2012-11-18 NOTE — ED Notes (Signed)
PA at bedside to speak with patient.  

## 2012-11-18 NOTE — ED Notes (Signed)
Pt states she has been out of psych meds x 3 weeks and she has been having issues w/ her family pt states that she is bipolar. Pt talking loudy and using foul language.

## 2012-11-18 NOTE — ED Notes (Addendum)
When patient came in she was yelling obscenities.   Patient went to restroom to give urine sample and cursed staff.   Patient was asked to put on scrubs.   Patient refused.    Patient walked out of room to triage to try and leave.  Patient was talked down and brought back to room.  Patient cursing and slamming things in the room.  Patient admits to drinking today.

## 2012-11-20 NOTE — ED Provider Notes (Signed)
Medical screening examination/treatment/procedure(s) were performed by non-physician practitioner and as supervising physician I was immediately available for consultation/collaboration.   Yianni Skilling H Katricia Prehn, MD 11/20/12 0657 

## 2013-06-06 ENCOUNTER — Emergency Department (HOSPITAL_COMMUNITY): Payer: Self-pay

## 2013-06-06 ENCOUNTER — Encounter (HOSPITAL_COMMUNITY): Payer: Self-pay | Admitting: *Deleted

## 2013-06-06 ENCOUNTER — Emergency Department (HOSPITAL_COMMUNITY)
Admission: EM | Admit: 2013-06-06 | Discharge: 2013-06-07 | Disposition: A | Discharge status: Home / Self Care | Payer: Self-pay | Attending: Emergency Medicine | Admitting: Emergency Medicine

## 2013-06-06 DIAGNOSIS — S0990XA Unspecified injury of head, initial encounter: Secondary | ICD-10-CM | POA: Insufficient documentation

## 2013-06-06 DIAGNOSIS — F329 Major depressive disorder, single episode, unspecified: Secondary | ICD-10-CM | POA: Insufficient documentation

## 2013-06-06 DIAGNOSIS — Y9389 Activity, other specified: Secondary | ICD-10-CM | POA: Insufficient documentation

## 2013-06-06 DIAGNOSIS — Z87891 Personal history of nicotine dependence: Secondary | ICD-10-CM | POA: Insufficient documentation

## 2013-06-06 DIAGNOSIS — Z9889 Other specified postprocedural states: Secondary | ICD-10-CM | POA: Insufficient documentation

## 2013-06-06 DIAGNOSIS — Y92410 Unspecified street and highway as the place of occurrence of the external cause: Secondary | ICD-10-CM | POA: Insufficient documentation

## 2013-06-06 DIAGNOSIS — J45901 Unspecified asthma with (acute) exacerbation: Secondary | ICD-10-CM | POA: Insufficient documentation

## 2013-06-06 DIAGNOSIS — M79605 Pain in left leg: Secondary | ICD-10-CM

## 2013-06-06 DIAGNOSIS — Z8614 Personal history of Methicillin resistant Staphylococcus aureus infection: Secondary | ICD-10-CM | POA: Insufficient documentation

## 2013-06-06 DIAGNOSIS — S300XXA Contusion of lower back and pelvis, initial encounter: Secondary | ICD-10-CM

## 2013-06-06 DIAGNOSIS — F3289 Other specified depressive episodes: Secondary | ICD-10-CM | POA: Insufficient documentation

## 2013-06-06 DIAGNOSIS — IMO0002 Reserved for concepts with insufficient information to code with codable children: Secondary | ICD-10-CM | POA: Insufficient documentation

## 2013-06-06 DIAGNOSIS — Z79899 Other long term (current) drug therapy: Secondary | ICD-10-CM | POA: Insufficient documentation

## 2013-06-06 DIAGNOSIS — M79604 Pain in right leg: Secondary | ICD-10-CM

## 2013-06-06 DIAGNOSIS — S0510XA Contusion of eyeball and orbital tissues, unspecified eye, initial encounter: Secondary | ICD-10-CM | POA: Insufficient documentation

## 2013-06-06 DIAGNOSIS — S20229A Contusion of unspecified back wall of thorax, initial encounter: Secondary | ICD-10-CM | POA: Insufficient documentation

## 2013-06-06 DIAGNOSIS — R112 Nausea with vomiting, unspecified: Secondary | ICD-10-CM | POA: Insufficient documentation

## 2013-06-06 DIAGNOSIS — R059 Cough, unspecified: Secondary | ICD-10-CM | POA: Insufficient documentation

## 2013-06-06 DIAGNOSIS — Z8619 Personal history of other infectious and parasitic diseases: Secondary | ICD-10-CM | POA: Insufficient documentation

## 2013-06-06 DIAGNOSIS — S0512XA Contusion of eyeball and orbital tissues, left eye, initial encounter: Secondary | ICD-10-CM

## 2013-06-06 DIAGNOSIS — F411 Generalized anxiety disorder: Secondary | ICD-10-CM | POA: Insufficient documentation

## 2013-06-06 DIAGNOSIS — S93402A Sprain of unspecified ligament of left ankle, initial encounter: Secondary | ICD-10-CM

## 2013-06-06 DIAGNOSIS — R05 Cough: Secondary | ICD-10-CM | POA: Insufficient documentation

## 2013-06-06 DIAGNOSIS — S8990XA Unspecified injury of unspecified lower leg, initial encounter: Secondary | ICD-10-CM | POA: Insufficient documentation

## 2013-06-06 DIAGNOSIS — S93409A Sprain of unspecified ligament of unspecified ankle, initial encounter: Secondary | ICD-10-CM | POA: Insufficient documentation

## 2013-06-06 LAB — CBC
HCT: 38.4 % (ref 36.0–46.0)
Hemoglobin: 12.9 g/dL (ref 12.0–15.0)
MCH: 31.1 pg (ref 26.0–34.0)
MCHC: 33.6 g/dL (ref 30.0–36.0)
MCV: 92.5 fL (ref 78.0–100.0)
Platelets: 222 10*3/uL (ref 150–400)
RBC: 4.15 MIL/uL (ref 3.87–5.11)
RDW: 13.5 % (ref 11.5–15.5)
WBC: 5.9 10*3/uL (ref 4.0–10.5)

## 2013-06-06 MED ORDER — ONDANSETRON HCL 4 MG/2ML IJ SOLN
4.0000 mg | Freq: Once | INTRAMUSCULAR | Status: AC
  Administered 2013-06-06: 4 mg via INTRAVENOUS
  Filled 2013-06-06: qty 2

## 2013-06-06 MED ORDER — ALBUTEROL SULFATE HFA 108 (90 BASE) MCG/ACT IN AERS
2.0000 | INHALATION_SPRAY | RESPIRATORY_TRACT | Status: DC | PRN
  Administered 2013-06-06: 2 via RESPIRATORY_TRACT
  Filled 2013-06-06: qty 6.7

## 2013-06-06 MED ORDER — HYDROMORPHONE HCL PF 1 MG/ML IJ SOLN
1.0000 mg | Freq: Once | INTRAMUSCULAR | Status: AC
  Administered 2013-06-06: 1 mg via INTRAVENOUS
  Filled 2013-06-06: qty 1

## 2013-06-06 MED ORDER — DIPHENHYDRAMINE HCL 50 MG/ML IJ SOLN: 25.0000 mg | Freq: Once | INTRAMUSCULAR | Status: DC
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
## 2013-06-06 NOTE — ED Notes (Signed)
Pt was not in room.  Will draw labs upon pt's return.
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
## 2013-06-06 NOTE — ED Notes (Signed)
Pt transported to CT and x-ray  

## 2013-06-06 NOTE — ED Notes (Signed)
Dr. Yao at bedside. 

## 2013-06-07 ENCOUNTER — Telehealth (HOSPITAL_COMMUNITY): Payer: Self-pay | Admitting: *Deleted

## 2013-06-07 LAB — COMPREHENSIVE METABOLIC PANEL WITH GFR
ALT: 59 U/L — ABNORMAL HIGH (ref 0–35)
AST: 47 U/L — ABNORMAL HIGH (ref 0–37)
Albumin: 3.3 g/dL — ABNORMAL LOW (ref 3.5–5.2)
Alkaline Phosphatase: 109 U/L (ref 39–117)
BUN: 14 mg/dL (ref 6–23)
CO2: 25 meq/L (ref 19–32)
Calcium: 8.8 mg/dL (ref 8.4–10.5)
Chloride: 105 meq/L (ref 96–112)
Creatinine, Ser: 0.85 mg/dL (ref 0.50–1.10)
GFR calc Af Amer: >90 mL/min
GFR calc non Af Amer: 79 mL/min — ABNORMAL LOW
Glucose, Bld: 117 mg/dL — ABNORMAL HIGH (ref 70–99)
Potassium: 4.3 meq/L (ref 3.5–5.1)
Sodium: 139 meq/L (ref 135–145)
Total Bilirubin: 0.6 mg/dL (ref 0.3–1.2)
Total Protein: 6.4 g/dL (ref 6.0–8.3)

## 2013-06-07 MED ORDER — HYDROCODONE-ACETAMINOPHEN 5-325 MG PO TABS: 2.0000 | ORAL_TABLET | ORAL | Status: DC | PRN

## 2013-06-07 MED ORDER — OXYCODONE HCL 5 MG PO TABS
5.0000 mg | ORAL_TABLET | Freq: Once | ORAL | Status: AC
  Administered 2013-06-07: 5 mg via ORAL
  Filled 2013-06-07: qty 1

## 2013-06-07 MED ORDER — IBUPROFEN 600 MG PO TABS: 600.0000 mg | ORAL_TABLET | Freq: Four times a day (QID) | ORAL | Status: DC | PRN
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
## 2013-06-17 ENCOUNTER — Encounter (HOSPITAL_COMMUNITY): Payer: Self-pay | Admitting: Emergency Medicine

## 2013-06-17 ENCOUNTER — Emergency Department (HOSPITAL_COMMUNITY)
Admission: EM | Admit: 2013-06-17 | Discharge: 2013-06-17 | Discharge status: Against Medical Advice | Payer: Self-pay | Attending: Emergency Medicine | Admitting: Emergency Medicine

## 2013-06-17 DIAGNOSIS — L02419 Cutaneous abscess of limb, unspecified: Secondary | ICD-10-CM | POA: Insufficient documentation

## 2013-06-17 DIAGNOSIS — J45909 Unspecified asthma, uncomplicated: Secondary | ICD-10-CM | POA: Insufficient documentation

## 2013-06-17 DIAGNOSIS — Z87828 Personal history of other (healed) physical injury and trauma: Secondary | ICD-10-CM | POA: Insufficient documentation

## 2013-06-17 DIAGNOSIS — M7989 Other specified soft tissue disorders: Secondary | ICD-10-CM | POA: Insufficient documentation

## 2013-06-17 DIAGNOSIS — Z87891 Personal history of nicotine dependence: Secondary | ICD-10-CM | POA: Insufficient documentation
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
## 2013-06-28 ENCOUNTER — Telehealth (HOSPITAL_COMMUNITY): Payer: Self-pay | Admitting: Licensed Clinical Social Worker

## 2013-06-28 ENCOUNTER — Inpatient Hospital Stay (HOSPITAL_COMMUNITY)
Admission: AD | Admit: 2013-06-28 | Discharge: 2013-07-04 | DRG: 897 | Disposition: A | Discharge status: Home / Self Care | Payer: No Typology Code available for payment source | Source: Intra-hospital | Attending: Emergency Medicine | Admitting: Emergency Medicine

## 2013-06-28 ENCOUNTER — Ambulatory Visit (HOSPITAL_COMMUNITY)
Admission: RE | Admit: 2013-06-28 | Discharge: 2013-06-28 | Disposition: A | Discharge status: Home / Self Care | Payer: Self-pay | Attending: Psychiatry | Admitting: Psychiatry

## 2013-06-28 ENCOUNTER — Emergency Department (HOSPITAL_COMMUNITY)
Admission: EM | Admit: 2013-06-28 | Discharge: 2013-06-28 | Disposition: A | Discharge status: Other Acute Inpatient Hospital | Payer: Self-pay | Attending: Emergency Medicine | Admitting: Emergency Medicine

## 2013-06-28 ENCOUNTER — Encounter (HOSPITAL_COMMUNITY): Payer: Self-pay | Admitting: Emergency Medicine

## 2013-06-28 DIAGNOSIS — Z87891 Personal history of nicotine dependence: Secondary | ICD-10-CM | POA: Insufficient documentation

## 2013-06-28 DIAGNOSIS — A599 Trichomoniasis, unspecified: Secondary | ICD-10-CM | POA: Insufficient documentation

## 2013-06-28 DIAGNOSIS — F1020 Alcohol dependence, uncomplicated: Secondary | ICD-10-CM | POA: Diagnosis present

## 2013-06-28 DIAGNOSIS — F1010 Alcohol abuse, uncomplicated: Secondary | ICD-10-CM | POA: Insufficient documentation

## 2013-06-28 DIAGNOSIS — Z711 Person with feared health complaint in whom no diagnosis is made: Secondary | ICD-10-CM

## 2013-06-28 DIAGNOSIS — F3162 Bipolar disorder, current episode mixed, moderate: Secondary | ICD-10-CM

## 2013-06-28 DIAGNOSIS — R45851 Suicidal ideations: Secondary | ICD-10-CM

## 2013-06-28 DIAGNOSIS — F4310 Post-traumatic stress disorder, unspecified: Secondary | ICD-10-CM | POA: Diagnosis present

## 2013-06-28 DIAGNOSIS — F332 Major depressive disorder, recurrent severe without psychotic features: Secondary | ICD-10-CM | POA: Diagnosis present

## 2013-06-28 DIAGNOSIS — F10939 Alcohol use, unspecified with withdrawal, unspecified: Principal | ICD-10-CM | POA: Diagnosis present

## 2013-06-28 DIAGNOSIS — G8911 Acute pain due to trauma: Secondary | ICD-10-CM | POA: Insufficient documentation

## 2013-06-28 DIAGNOSIS — Z79899 Other long term (current) drug therapy: Secondary | ICD-10-CM | POA: Insufficient documentation

## 2013-06-28 DIAGNOSIS — J45909 Unspecified asthma, uncomplicated: Secondary | ICD-10-CM | POA: Diagnosis present

## 2013-06-28 DIAGNOSIS — Z202 Contact with and (suspected) exposure to infections with a predominantly sexual mode of transmission: Secondary | ICD-10-CM | POA: Insufficient documentation

## 2013-06-28 DIAGNOSIS — M79605 Pain in left leg: Secondary | ICD-10-CM

## 2013-06-28 DIAGNOSIS — F411 Generalized anxiety disorder: Secondary | ICD-10-CM | POA: Insufficient documentation

## 2013-06-28 DIAGNOSIS — F39 Unspecified mood [affective] disorder: Secondary | ICD-10-CM | POA: Insufficient documentation

## 2013-06-28 DIAGNOSIS — B1920 Unspecified viral hepatitis C without hepatic coma: Secondary | ICD-10-CM | POA: Diagnosis present

## 2013-06-28 DIAGNOSIS — F1320 Sedative, hypnotic or anxiolytic dependence, uncomplicated: Secondary | ICD-10-CM | POA: Diagnosis present

## 2013-06-28 DIAGNOSIS — F19939 Other psychoactive substance use, unspecified with withdrawal, unspecified: Secondary | ICD-10-CM | POA: Diagnosis present

## 2013-06-28 DIAGNOSIS — Z8614 Personal history of Methicillin resistant Staphylococcus aureus infection: Secondary | ICD-10-CM | POA: Insufficient documentation

## 2013-06-28 DIAGNOSIS — F10239 Alcohol dependence with withdrawal, unspecified: Principal | ICD-10-CM | POA: Diagnosis present

## 2013-06-28 DIAGNOSIS — J069 Acute upper respiratory infection, unspecified: Secondary | ICD-10-CM

## 2013-06-28 DIAGNOSIS — F4329 Adjustment disorder with other symptoms: Secondary | ICD-10-CM

## 2013-06-28 DIAGNOSIS — M79609 Pain in unspecified limb: Secondary | ICD-10-CM | POA: Insufficient documentation

## 2013-06-28 DIAGNOSIS — Z590 Homelessness: Secondary | ICD-10-CM

## 2013-06-28 DIAGNOSIS — R41840 Attention and concentration deficit: Secondary | ICD-10-CM | POA: Insufficient documentation

## 2013-06-28 DIAGNOSIS — F1994 Other psychoactive substance use, unspecified with psychoactive substance-induced mood disorder: Secondary | ICD-10-CM

## 2013-06-28 LAB — URINALYSIS, ROUTINE W REFLEX MICROSCOPIC
Bilirubin Urine: NEGATIVE
Glucose, UA: NEGATIVE mg/dL
Hgb urine dipstick: NEGATIVE
Ketones, ur: NEGATIVE mg/dL
Nitrite: NEGATIVE
Protein, ur: NEGATIVE mg/dL
Specific Gravity, Urine: 1.028 (ref 1.005–1.030)
Urobilinogen, UA: 0.2 mg/dL (ref 0.0–1.0)
pH: 5 (ref 5.0–8.0)

## 2013-06-28 LAB — CBC
HCT: 43.9 % (ref 36.0–46.0)
Hemoglobin: 14.7 g/dL (ref 12.0–15.0)
MCH: 31.5 pg (ref 26.0–34.0)
MCHC: 33.5 g/dL (ref 30.0–36.0)
MCV: 94 fL (ref 78.0–100.0)
Platelets: 236 10*3/uL (ref 150–400)
RBC: 4.67 MIL/uL (ref 3.87–5.11)
RDW: 14.3 % (ref 11.5–15.5)
WBC: 4.6 10*3/uL (ref 4.0–10.5)

## 2013-06-28 LAB — COMPREHENSIVE METABOLIC PANEL
ALT: 61 U/L — ABNORMAL HIGH (ref 0–35)
AST: 49 U/L — ABNORMAL HIGH (ref 0–37)
Albumin: 3.5 g/dL (ref 3.5–5.2)
Alkaline Phosphatase: 118 U/L — ABNORMAL HIGH (ref 39–117)
BUN: 14 mg/dL (ref 6–23)
CO2: 22 mEq/L (ref 19–32)
Calcium: 9.4 mg/dL (ref 8.4–10.5)
Chloride: 101 mEq/L (ref 96–112)
Creatinine, Ser: 0.94 mg/dL (ref 0.50–1.10)
GFR calc Af Amer: 81 mL/min — ABNORMAL LOW (ref >90)
GFR calc non Af Amer: 70 mL/min — ABNORMAL LOW (ref >90)
Glucose, Bld: 111 mg/dL — ABNORMAL HIGH (ref 70–99)
Potassium: 4 mEq/L (ref 3.5–5.1)
Sodium: 136 mEq/L (ref 135–145)
Total Bilirubin: 0.6 mg/dL (ref 0.3–1.2)
Total Protein: 7.1 g/dL (ref 6.0–8.3)

## 2013-06-28 LAB — SALICYLATE LEVEL: Salicylate Lvl: <2 mg/dL — ABNORMAL LOW (ref 2.8–20.0)

## 2013-06-28 LAB — ACETAMINOPHEN LEVEL: Acetaminophen (Tylenol), Serum: <15 ug/mL (ref 10–30)

## 2013-06-28 LAB — URINE MICROSCOPIC-ADD ON

## 2013-06-28 LAB — RAPID URINE DRUG SCREEN, HOSP PERFORMED
Amphetamines: NOT DETECTED
Barbiturates: NOT DETECTED
Benzodiazepines: POSITIVE — AB
Cocaine: POSITIVE — AB
Opiates: NOT DETECTED
Tetrahydrocannabinol: NOT DETECTED

## 2013-06-28 LAB — RPR: RPR Ser Ql: NONREACTIVE

## 2013-06-28 LAB — ETHANOL: Alcohol, Ethyl (B): 50 mg/dL — ABNORMAL HIGH (ref 0–11)

## 2013-06-28 LAB — HIV ANTIBODY (ROUTINE TESTING W REFLEX): HIV: NONREACTIVE

## 2013-06-28 MED ORDER — ALBUTEROL SULFATE HFA 108 (90 BASE) MCG/ACT IN AERS: 2.0000 | INHALATION_SPRAY | Freq: Four times a day (QID) | RESPIRATORY_TRACT | Status: DC | PRN

## 2013-06-28 MED ORDER — FLUOXETINE HCL 20 MG PO CAPS
20.0000 mg | ORAL_CAPSULE | Freq: Every day | ORAL | Status: DC
  Administered 2013-06-28: 20 mg via ORAL
  Filled 2013-06-28 (×2): qty 1

## 2013-06-28 MED ORDER — HYDROXYZINE HCL 25 MG PO TABS
25.0000 mg | ORAL_TABLET | Freq: Once | ORAL | Status: AC
  Administered 2013-06-28: 25 mg via ORAL
  Filled 2013-06-28: qty 1

## 2013-06-28 MED ORDER — CEFTRIAXONE SODIUM 250 MG IJ SOLR
250.0000 mg | Freq: Once | INTRAMUSCULAR | Status: AC
  Administered 2013-06-28: 250 mg via INTRAMUSCULAR
  Filled 2013-06-28: qty 250

## 2013-06-28 MED ORDER — THIAMINE HCL 100 MG/ML IJ SOLN: 100.0000 mg | Freq: Every day | INTRAMUSCULAR | Status: DC

## 2013-06-28 MED ORDER — ALUM & MAG HYDROXIDE-SIMETH 200-200-20 MG/5ML PO SUSP: 30.0000 mL | ORAL | Status: DC | PRN

## 2013-06-28 MED ORDER — METRONIDAZOLE 500 MG PO TABS
500.0000 mg | ORAL_TABLET | Freq: Two times a day (BID) | ORAL | Status: DC
  Administered 2013-06-28 (×2): 500 mg via ORAL
  Filled 2013-06-28 (×2): qty 1

## 2013-06-28 MED ORDER — VITAMIN B-1 100 MG PO TABS
100.0000 mg | ORAL_TABLET | Freq: Every day | ORAL | Status: DC
  Administered 2013-06-28: 100 mg via ORAL
  Filled 2013-06-28: qty 1

## 2013-06-28 MED ORDER — NICOTINE 21 MG/24HR TD PT24
21.0000 mg | MEDICATED_PATCH | Freq: Every day | TRANSDERMAL | Status: DC
  Filled 2013-06-28: qty 1

## 2013-06-28 MED ORDER — ZOLPIDEM TARTRATE 5 MG PO TABS: 5.0000 mg | ORAL_TABLET | Freq: Every evening | ORAL | Status: DC | PRN

## 2013-06-28 MED ORDER — ONDANSETRON HCL 4 MG PO TABS
4.0000 mg | ORAL_TABLET | Freq: Three times a day (TID) | ORAL | Status: DC | PRN
Start: 2013-06-28 — End: 2013-06-28

## 2013-06-28 MED ORDER — LORAZEPAM 1 MG PO TABS
1.0000 mg | ORAL_TABLET | Freq: Three times a day (TID) | ORAL | Status: DC | PRN
  Administered 2013-06-28: 1 mg via ORAL
  Filled 2013-06-28: qty 1

## 2013-06-28 MED ORDER — AZITHROMYCIN 250 MG PO TABS
1000.0000 mg | ORAL_TABLET | Freq: Once | ORAL | Status: AC
  Administered 2013-06-28: 1000 mg via ORAL
  Filled 2013-06-28: qty 4

## 2013-06-28 MED ORDER — IBUPROFEN 200 MG PO TABS: 600.0000 mg | ORAL_TABLET | Freq: Three times a day (TID) | ORAL | Status: DC | PRN

## 2013-06-28 NOTE — ED Notes (Signed)
Pt was brought over by crisis team from [facility] for medical clearance. Pt said she is SI and wants detox from ETOH and klonopin. Pt is vol here.

## 2013-06-28 NOTE — BH Assessment (Signed)
Pt accepted to [facility] by Dr. Geryl Rankins per Mary,MHT at [facility] and will be assigned to bed 508-2. Pt's nurse agreed to complete support paperwork. Support documentation faxed to [facility] including Admission Checklist,Consent to Release, and ROI. Consulted with Dr. Freida Busman who is in agreement with disposition and will complete pt transfer.   Glorious Peach, MS, LCASA Assessment Counselor

## 2013-06-28 NOTE — ED Notes (Signed)
MD at bedside. 
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
## 2013-06-28 NOTE — BH Assessment (Signed)
[facility] Assessment Progress Note   Patient has been accepted to [facility] room 508-2.  Accepting is [facility] and attending will be Dr. Carmelina Dane.  Josh, PA in fast track at [facility] was notified.  He said he would let Robin know.  Clinician called [facility] and spoke to [facility] about patient being accepted.  She will let nurse know so that the voluntary paperwork can be signed and Juel Burrow can be contacted for transport.
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
## 2013-06-28 NOTE — ED Provider Notes (Signed)
Medical screening examination/treatment/procedure(s) were performed by non-physician practitioner and as supervising physician I was immediately available for consultation/collaboration.  Upton Russey T Naethan Bracewell, MD 06/28/13 2328 

## 2013-06-29 ENCOUNTER — Inpatient Hospital Stay (HOSPITAL_COMMUNITY): Payer: No Typology Code available for payment source

## 2013-06-29 ENCOUNTER — Encounter (HOSPITAL_COMMUNITY): Payer: Self-pay

## 2013-06-29 DIAGNOSIS — F319 Bipolar disorder, unspecified: Secondary | ICD-10-CM

## 2013-06-29 DIAGNOSIS — F1020 Alcohol dependence, uncomplicated: Secondary | ICD-10-CM

## 2013-06-29 DIAGNOSIS — M79609 Pain in unspecified limb: Secondary | ICD-10-CM

## 2013-06-29 DIAGNOSIS — F1320 Sedative, hypnotic or anxiolytic dependence, uncomplicated: Secondary | ICD-10-CM

## 2013-06-29 LAB — GC/CHLAMYDIA PROBE AMP
CT Probe RNA: NEGATIVE
GC Probe RNA: NEGATIVE

## 2013-06-29 LAB — URINE CULTURE: Colony Count: 70000

## 2013-06-29 MED ORDER — IOHEXOL 350 MG/ML SOLN
100.0000 mL | Freq: Once | INTRAVENOUS | Status: AC | PRN
  Administered 2013-06-29: 100 mL via INTRAVENOUS

## 2013-06-29 MED ORDER — CHLORDIAZEPOXIDE HCL 25 MG PO CAPS
25.0000 mg | ORAL_CAPSULE | Freq: Every day | ORAL | Status: AC
  Filled 2013-06-29: qty 1

## 2013-06-29 MED ORDER — ALBUTEROL SULFATE HFA 108 (90 BASE) MCG/ACT IN AERS: 2.0000 | INHALATION_SPRAY | Freq: Four times a day (QID) | RESPIRATORY_TRACT | Status: DC | PRN

## 2013-06-29 MED ORDER — VITAMIN B-1 100 MG PO TABS
100.0000 mg | ORAL_TABLET | Freq: Every day | ORAL | Status: DC
  Administered 2013-06-30 – 2013-07-04 (×5): 100 mg via ORAL
  Filled 2013-06-29 (×9): qty 1

## 2013-06-29 MED ORDER — METHOCARBAMOL 500 MG PO TABS
500.0000 mg | ORAL_TABLET | Freq: Three times a day (TID) | ORAL | Status: DC
  Administered 2013-06-29 – 2013-07-02 (×5): 500 mg via ORAL
  Filled 2013-06-29 (×22): qty 1

## 2013-06-29 MED ORDER — ONDANSETRON 4 MG PO TBDP: 4.0000 mg | ORAL_TABLET | Freq: Four times a day (QID) | ORAL | Status: AC | PRN

## 2013-06-29 MED ORDER — ALUM & MAG HYDROXIDE-SIMETH 200-200-20 MG/5ML PO SUSP: 30.0000 mL | ORAL | Status: DC | PRN

## 2013-06-29 MED ORDER — CHLORDIAZEPOXIDE HCL 25 MG PO CAPS
25.0000 mg | ORAL_CAPSULE | Freq: Four times a day (QID) | ORAL | Status: AC
  Administered 2013-06-29 – 2013-06-30 (×6): 25 mg via ORAL
  Filled 2013-06-29 (×6): qty 1

## 2013-06-29 MED ORDER — CHLORDIAZEPOXIDE HCL 25 MG PO CAPS
25.0000 mg | ORAL_CAPSULE | Freq: Four times a day (QID) | ORAL | Status: AC | PRN
  Administered 2013-06-30: 25 mg via ORAL
  Filled 2013-06-29: qty 1

## 2013-06-29 MED ORDER — SODIUM CHLORIDE 0.9 % IV SOLN
Freq: Once | INTRAVENOUS | Status: AC
  Administered 2013-06-29: 20 mL/h via INTRAVENOUS

## 2013-06-29 MED ORDER — METRONIDAZOLE 500 MG PO TABS
500.0000 mg | ORAL_TABLET | Freq: Two times a day (BID) | ORAL | Status: DC
  Administered 2013-06-29 – 2013-07-04 (×11): 500 mg via ORAL
  Filled 2013-06-29 (×8): qty 1
  Filled 2013-06-29: qty 2
  Filled 2013-06-29 (×7): qty 1
  Filled 2013-06-29: qty 2
  Filled 2013-06-29: qty 1

## 2013-06-29 MED ORDER — ACETAMINOPHEN 325 MG PO TABS: 650.0000 mg | ORAL_TABLET | Freq: Four times a day (QID) | ORAL | Status: DC | PRN

## 2013-06-29 MED ORDER — ADULT MULTIVITAMIN W/MINERALS CH
1.0000 | ORAL_TABLET | Freq: Every day | ORAL | Status: DC
  Administered 2013-06-29 – 2013-07-04 (×6): 1 via ORAL
  Filled 2013-06-29 (×11): qty 1

## 2013-06-29 MED ORDER — NICOTINE 21 MG/24HR TD PT24
21.0000 mg | MEDICATED_PATCH | Freq: Every day | TRANSDERMAL | Status: DC
  Filled 2013-06-29: qty 1

## 2013-06-29 MED ORDER — CHLORDIAZEPOXIDE HCL 25 MG PO CAPS
25.0000 mg | ORAL_CAPSULE | Freq: Once | ORAL | Status: AC
  Administered 2013-06-29: 25 mg via ORAL
  Filled 2013-06-29: qty 1

## 2013-06-29 MED ORDER — KETOROLAC TROMETHAMINE 30 MG/ML IJ SOLN
30.0000 mg | Freq: Once | INTRAMUSCULAR | Status: AC
  Administered 2013-06-30: 30 mg via INTRAVENOUS
  Filled 2013-06-29: qty 1

## 2013-06-29 MED ORDER — CHLORDIAZEPOXIDE HCL 25 MG PO CAPS
25.0000 mg | ORAL_CAPSULE | ORAL | Status: AC
  Administered 2013-07-01 – 2013-07-02 (×2): 25 mg via ORAL
  Filled 2013-06-29 (×2): qty 1

## 2013-06-29 MED ORDER — CHLORDIAZEPOXIDE HCL 25 MG PO CAPS
25.0000 mg | ORAL_CAPSULE | Freq: Three times a day (TID) | ORAL | Status: AC
  Administered 2013-06-30 – 2013-07-01 (×3): 25 mg via ORAL
  Filled 2013-06-29 (×3): qty 1

## 2013-06-29 MED ORDER — FLUOXETINE HCL 20 MG PO CAPS
20.0000 mg | ORAL_CAPSULE | Freq: Every day | ORAL | Status: DC
  Administered 2013-06-29 – 2013-07-01 (×3): 20 mg via ORAL
  Filled 2013-06-29 (×7): qty 1

## 2013-06-29 MED ORDER — MAGNESIUM HYDROXIDE 400 MG/5ML PO SUSP: 30.0000 mL | Freq: Every day | ORAL | Status: DC | PRN

## 2013-06-29 MED ORDER — HYDROXYZINE HCL 25 MG PO TABS: 25.0000 mg | ORAL_TABLET | Freq: Four times a day (QID) | ORAL | Status: DC | PRN

## 2013-06-29 MED ORDER — LOPERAMIDE HCL 2 MG PO CAPS: 2.0000 mg | ORAL_CAPSULE | ORAL | Status: AC | PRN

## 2013-06-29 MED ORDER — THIAMINE HCL 100 MG/ML IJ SOLN
100.0000 mg | Freq: Once | INTRAMUSCULAR | Status: AC
  Administered 2013-06-29: 100 mg via INTRAMUSCULAR
  Filled 2013-06-29: qty 2
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
## 2013-06-29 NOTE — ED Notes (Signed)
Bed: WA06 Expected date:  Expected time:  Means of arrival:  Comments: Triage 4 
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
## 2013-06-29 NOTE — Progress Notes (Addendum)
Recreation Therapy Notes  Date: 10.08.2014 Time: 3:00pmam  Location: [address] Dayroom  Group Topic: Primary: Safety, Secondary: Communication, Teamwork.  Goal Area(s) Addresses:  Patient will successfully work independently to determine survival needs. Patient with successfully work with peers to determine survival needs.   Patient will relate activity to personal safety.   Behavioral Response: Did not attend  Jearl Klinefelter, LRT/CTRS  Terrell Shimko L 06/29/2013 3:50 PM
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
## 2013-06-29 NOTE — Tx Team (Signed)
Interdisciplinary Treatment Plan Update   Date Reviewed:  06/29/2013  Time Reviewed:  9:46 AM  Progress in Treatment:   Attending groups: Yes Participating in groups: Yes Taking medication as prescribed: Yes  Tolerating medication: Yes Family/Significant other contact made:No, but will ask patient for consent for collateral contact Patient understands diagnosis: Yes  Discussing patient identified problems/goals with staff: Yes Medical problems stabilized or resolved: Yes Denies suicidal/homicidal ideation: Yes Patient has not harmed self or others: Yes  For review of initial/current patient goals, please see plan of care.  Estimated Length of Stay:  3-5 days  Reasons for Continued Hospitalization:  Anxiety Depression Medication stabilization Suicidal ideation Psychosis  New Problems/Goals identified:    Discharge Plan or Barriers:   Home with outpatient follow up to be determined  Additional Comments:    Kristina Huffman is an 52 y.o. female with history of depression, Heroin, Alcohol, and Anxiety. She presents to [facility] as a walk-in stating she is suicidal. She has a plan and intent to jump off a bridge. Says she walked along side a bridge 2 days ago and had impulses to jump off. Patient is unable to contract for safety at this time. She has a history of 1 prior suicide attempt (overdose) in 02-09-2011 triggered by the death of her son. Says she found her son dead after he overdosed on Heroin. Her current suicidal thoughts have been on-going; worsening over the last week. Currently triggered by her living situation.   Attendees:  Patient:  06/29/2013 9:46 AM   Signature: Mervyn Gay, MD 06/29/2013 9:46 AM  Signature:  06/29/2013 9:46 AM  Signature: Harold Barban, RN 06/29/2013 9:46 AM  Signature: 06/29/2013 9:46 AM  Signature:  06/29/2013 9:46 AM  Signature:  Juline Patch, LCSW 06/29/2013 9:46 AM  Signature:  Reyes Ivan, LCSW 06/29/2013 9:46 AM  Signature:  Maseta Dorley,Care  Coordinator 06/29/2013 9:46 AM  Signature:  06/29/2013 9:46 AM  Signature:  06/29/2013  9:46 AM  Signature:   Nestor Ramp, RN 06/29/2013  9:46 AM  Signature:  Elliot Cousin, RN 06/29/2013  9:46 AM    Scribe for Treatment Team:   Juline Patch,  06/29/2013 9:46 AM
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
## 2013-06-29 NOTE — Progress Notes (Signed)
D: Patient appropriate and cooperative with staff. Patient's affect/mood is depressed; tearful at times. She's attending groups and meals. Compliant with current medication regimen. Patient rated depression and feelings of hopelessness "8".  A: Support and encouragement provided to patient. Administered scheduled medications per ordering MD. Monitor Q15 minute checks for safety.  R: Patient receptive. Denies SI/HI/AVH. Patient remains safe on the unit.

## 2013-06-29 NOTE — Progress Notes (Signed)
Chaplain provided support with pt in response to SW referral.  Familiar with pt from prior admission.   Provided support around pt's extended and complicated grief around loss of her 50 y/o son (2 years prior), two children who have been adopted and one child in foster care.  Pt found son in home having died of overdose.  Spoke with chaplain about guilt, anxiety, feeling overwhelmed around death of son.   Anxiety and guilt around potential responsibilities in caring for father aging father.  Worked with chaplain on potential for setting boundaries and what care she is able to offer at this point.   Relationship with father contributes to feelings of guilt around death of son.  Expressed feelings of isolation and explored how her faith figures in her grief and planning for future.    Belva Crome MDiv

## 2013-06-29 NOTE — [facility] Group Notes (Signed)
[facility] LCSW Aftercare Discharge Planning Group Note   06/29/2013 9:45 AM  Participation Quality:  Did not attend.  Rolf Fells, Joesph July
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
## 2013-06-29 NOTE — Tx Team (Signed)
Initial Interdisciplinary Treatment Plan  PATIENT STRENGTHS: (choose at least two) Ability for insight Average or above average intelligence Capable of independent living Communication skills Motivation for treatment/growth Physical Health  PATIENT STRESSORS: Financial difficulties Marital or family conflict Substance abuse   PROBLEM LIST: Problem List/Patient Goals Date to be addressed Date deferred Reason deferred Estimated date of resolution  Detox from alcohol 10/8     depression 10/8                                                DISCHARGE CRITERIA:  Ability to meet basic life and health needs Adequate post-discharge living arrangements Improved stabilization in mood, thinking, and/or behavior Reduction of life-threatening or endangering symptoms to within safe limits  PRELIMINARY DISCHARGE PLAN: Outpatient therapy Placement in alternative living arrangements  PATIENT/FAMIILY INVOLVEMENT: This treatment plan has been presented to and reviewed with the patient, Kristina Huffman, and/or family member,.  The patient and family have been given the opportunity to ask questions and make suggestions.  Kristina Huffman [facility] 06/29/2013, 3:04 AM
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
## 2013-06-30 ENCOUNTER — Inpatient Hospital Stay (HOSPITAL_COMMUNITY): Payer: No Typology Code available for payment source

## 2013-06-30 ENCOUNTER — Inpatient Hospital Stay (HOSPITAL_COMMUNITY)
Admission: AD | Admit: 2013-06-30 | Discharge: 2013-06-30 | Disposition: A | Discharge status: Home / Self Care | Payer: No Typology Code available for payment source | Source: Intra-hospital | Attending: Physician Assistant | Admitting: Physician Assistant

## 2013-06-30 DIAGNOSIS — F1994 Other psychoactive substance use, unspecified with psychoactive substance-induced mood disorder: Secondary | ICD-10-CM

## 2013-06-30 DIAGNOSIS — F1310 Sedative, hypnotic or anxiolytic abuse, uncomplicated: Secondary | ICD-10-CM

## 2013-06-30 DIAGNOSIS — F4310 Post-traumatic stress disorder, unspecified: Secondary | ICD-10-CM

## 2013-06-30 DIAGNOSIS — F332 Major depressive disorder, recurrent severe without psychotic features: Secondary | ICD-10-CM

## 2013-06-30 DIAGNOSIS — F3130 Bipolar disorder, current episode depressed, mild or moderate severity, unspecified: Secondary | ICD-10-CM

## 2013-06-30 LAB — CBC WITH DIFFERENTIAL/PLATELET
Basophils Absolute: 0 10*3/uL (ref 0.0–0.1)
Basophils Relative: 0 % (ref 0–1)
Eosinophils Absolute: 0.2 10*3/uL (ref 0.0–0.7)
Eosinophils Relative: 4 % (ref 0–5)
HCT: 38.7 % (ref 36.0–46.0)
Hemoglobin: 13 g/dL (ref 12.0–15.0)
Lymphocytes Relative: 33 % (ref 12–46)
Lymphs Abs: 1.5 10*3/uL (ref 0.7–4.0)
MCH: 31.5 pg (ref 26.0–34.0)
MCHC: 33.6 g/dL (ref 30.0–36.0)
MCV: 93.7 fL (ref 78.0–100.0)
Monocytes Absolute: 0.3 10*3/uL (ref 0.1–1.0)
Monocytes Relative: 7 % (ref 3–12)
Neutro Abs: 2.5 10*3/uL (ref 1.7–7.7)
Neutrophils Relative %: 55 % (ref 43–77)
Platelets: 212 10*3/uL (ref 150–400)
RBC: 4.13 MIL/uL (ref 3.87–5.11)
RDW: 13.8 % (ref 11.5–15.5)
WBC: 4.5 10*3/uL (ref 4.0–10.5)

## 2013-06-30 LAB — POCT I-STAT, CHEM 8
BUN: 23 mg/dL (ref 6–23)
Calcium, Ion: 1.19 mmol/L (ref 1.12–1.23)
Chloride: 104 mEq/L (ref 96–112)
Creatinine, Ser: 1.3 mg/dL — ABNORMAL HIGH (ref 0.50–1.10)
Glucose, Bld: 98 mg/dL (ref 70–99)
HCT: 39 % (ref 36.0–46.0)
Hemoglobin: 13.3 g/dL (ref 12.0–15.0)
Potassium: 3.9 mEq/L (ref 3.5–5.1)
Sodium: 139 mEq/L (ref 135–145)
TCO2: 25 mmol/L (ref 0–100)

## 2013-06-30 LAB — LACTIC ACID, PLASMA: Lactic Acid, Venous: 0.8 mmol/L (ref 0.5–2.2)

## 2013-06-30 LAB — PROTIME-INR
INR: 0.94 (ref 0.00–1.49)
Prothrombin Time: 12.4 seconds (ref 11.6–15.2)

## 2013-06-30 MED ORDER — CLINDAMYCIN HCL 300 MG PO CAPS
300.0000 mg | ORAL_CAPSULE | Freq: Once | ORAL | Status: AC
  Administered 2013-06-30: 300 mg via ORAL
  Filled 2013-06-30: qty 1

## 2013-06-30 MED ORDER — HYDROXYZINE HCL 50 MG PO TABS
50.0000 mg | ORAL_TABLET | Freq: Every evening | ORAL | Status: DC | PRN
  Administered 2013-06-30 – 2013-07-03 (×4): 50 mg via ORAL
  Filled 2013-06-30: qty 14
  Filled 2013-06-30 (×5): qty 1

## 2013-06-30 MED ORDER — ENOXAPARIN SODIUM 60 MG/0.6ML ~~LOC~~ SOLN
60.0000 mg | Freq: Once | SUBCUTANEOUS | Status: AC
  Administered 2013-06-30: 60 mg via SUBCUTANEOUS
  Filled 2013-06-30: qty 0.6

## 2013-06-30 NOTE — Progress Notes (Signed)
Patient ID: Kristina Huffman, female   DOB: 11/01/1960, 52 y.o.   MRN: [id number]  D: Writer introduced self to pt and inquired about her day. Pt stated, "kinda rough. My leg's been bothering me". Pt stated she's been depressed and crying a lot. Stated she was glad she spoke with the Chaplain. Pt was concerned about her leg informing the writer that the swelling had increased. Writer observed that pt was laying on her left side with her right leg on top of the left. Writer encouraged pt to either lay on her back or her right side. After several mins of conversation writer informed pt that info regarding her leg would be passed on to the NP.   A: NP ordered an ultrasound to rule out DVT. Continue 15 min checks for safety.  R: Pt remains safe.

## 2013-06-30 NOTE — Progress Notes (Signed)
[facility] MD Progress Note  06/30/2013 2:52 PM Kristina Huffman  MRN:  [id number] Subjective:  The patient has complained about leg pain and reportedly needed sonogram to rule out deep venous thrombosis. Patient has been doing well with her current detox treatment program/ Librium protocol. Patient has been compliant with her medication without adverse effects. Patient has been actively participating in unit. Patient has disturbance of sleep and appetite because of leg Pain. Patient denied symptoms of substance abuse withdrawal symptoms  Diagnosis:   DSM5: Schizophrenia Disorders:   Obsessive-Compulsive Disorders:   Trauma-Stressor Disorders:   Substance/Addictive Disorders:  Alcohol Intoxication (303.00) and Alcohol Withdrawal without Perceptual Disturbances (F10.239) Depressive Disorders:  Major Depressive Disorder - Severe (296.23)  Axis I: Bipolar, Depressed, Major Depression, Recurrent severe, Post Traumatic Stress Disorder, Substance Induced Mood Disorder and Alcohol dependency and benzodiazepine abuse  ADL's:  Impaired  Sleep: Poor  Appetite:  Fair  Suicidal Ideation:  Continued to have suicidal ideation but contracts for safety in the hospital  Homicidal Ideation:  Denied  AEB (as evidenced by):  Psychiatric Specialty Exam: ROS  Blood pressure 133/86, pulse 71, temperature 97.5 F (36.4 C), temperature source Oral, resp. rate 18, height 5\' 6"  (1.676 m), weight 96.163 kg (212 lb), last menstrual period 11/06/2012, SpO2 97.00%.Body mass index is 34.23 kg/(m^2).  General Appearance: Bizarre, Disheveled and Guarded  Eye Contact::  Fair  Speech:  Clear and Coherent  Volume:  Normal  Mood:  Anxious, Depressed, Dysphoric, Hopeless, Irritable and Worthless  Affect:  Depressed and Flat  Thought Process:  Goal Directed and Intact  Orientation:  Full (Time, Place, and Person)  Thought Content:  Rumination  Suicidal Thoughts:  Yes.  without intent/plan  Homicidal Thoughts:  No  Memory:   Immediate;   Fair  Judgement:  Impaired  Insight:  Lacking  Psychomotor Activity:  Restlessness  Concentration:  Fair  Recall:  Fair  Akathisia:  Yes  Handed:  Right  AIMS (if indicated):     Assets:  Communication Skills Desire for Improvement Physical Health Resilience Social Support  Sleep:  Number of Hours: 1.5   Current Medications: Current Facility-Administered Medications  Medication Dose Route Frequency Provider Last Rate Last Dose  . acetaminophen (TYLENOL) tablet 650 mg  650 mg Oral Q6H PRN Larena Sox, MD      . albuterol (PROVENTIL HFA;VENTOLIN HFA) 108 (90 BASE) MCG/ACT inhaler 2 puff  2 puff Inhalation Q6H PRN Larena Sox, MD      . alum & mag hydroxide-simeth (MAALOX/MYLANTA) 200-200-20 MG/5ML suspension 30 mL  30 mL Oral Q4H PRN Larena Sox, MD      . chlordiazePOXIDE (LIBRIUM) capsule 25 mg  25 mg Oral Q6H PRN Larena Sox, MD      . chlordiazePOXIDE (LIBRIUM) capsule 25 mg  25 mg Oral TID Larena Sox, MD       Followed by  . [START ON 07/01/2013] chlordiazePOXIDE (LIBRIUM) capsule 25 mg  25 mg Oral BH-qamhs Larena Sox, MD       Followed by  . [START ON 07/03/2013] chlordiazePOXIDE (LIBRIUM) capsule 25 mg  25 mg Oral Daily Larena Sox, MD      . FLUoxetine (PROZAC) capsule 20 mg  20 mg Oral Daily Sanjuana Kava, NP   20 mg at 06/30/13 0803  . hydrOXYzine (ATARAX/VISTARIL) tablet 25 mg  25 mg Oral Q6H PRN Larena Sox, MD      . loperamide (IMODIUM) capsule 2-4 mg  2-4 mg  Oral PRN Larena Sox, MD      . magnesium hydroxide (MILK OF MAGNESIA) suspension 30 mL  30 mL Oral Daily PRN Larena Sox, MD      . methocarbamol (ROBAXIN) tablet 500 mg  500 mg Oral TID Sanjuana Kava, NP   500 mg at 06/29/13 1700  . metroNIDAZOLE (FLAGYL) tablet 500 mg  500 mg Oral Q12H Larena Sox, MD   500 mg at 06/30/13 0802  . multivitamin with minerals tablet 1 tablet  1 tablet Oral Daily Larena Sox, MD   1 tablet at 06/30/13 0802   . ondansetron (ZOFRAN-ODT) disintegrating tablet 4 mg  4 mg Oral Q6H PRN Larena Sox, MD      . thiamine (VITAMIN B-1) tablet 100 mg  100 mg Oral Daily Larena Sox, MD   100 mg at 06/30/13 0803    Lab Results:  Results for orders placed during the hospital encounter of 06/28/13 (from the past 48 hour(s))  CBC WITH DIFFERENTIAL     Status: None   Collection Time    06/29/13 11:51 PM      Result Value Range   WBC 4.5  4.0 - 10.5 K/uL   RBC 4.13  3.87 - 5.11 MIL/uL   Hemoglobin 13.0  12.0 - 15.0 g/dL   HCT 62.9  52.8 - 41.3 %   MCV 93.7  78.0 - 100.0 fL   MCH 31.5  26.0 - 34.0 pg   MCHC 33.6  30.0 - 36.0 g/dL   RDW 24.4  01.0 - 27.2 %   Platelets 212  150 - 400 K/uL   Neutrophils Relative % 55  43 - 77 %   Neutro Abs 2.5  1.7 - 7.7 K/uL   Lymphocytes Relative 33  12 - 46 %   Lymphs Abs 1.5  0.7 - 4.0 K/uL   Monocytes Relative 7  3 - 12 %   Monocytes Absolute 0.3  0.1 - 1.0 K/uL   Eosinophils Relative 4  0 - 5 %   Eosinophils Absolute 0.2  0.0 - 0.7 K/uL   Basophils Relative 0  0 - 1 %   Basophils Absolute 0.0  0.0 - 0.1 K/uL  PROTIME-INR     Status: None   Collection Time    06/29/13 11:51 PM      Result Value Range   Prothrombin Time 12.4  11.6 - 15.2 seconds   INR 0.94  0.00 - 1.49  LACTIC ACID, PLASMA     Status: None   Collection Time    06/29/13 11:52 PM      Result Value Range   Lactic Acid, Venous 0.8  0.5 - 2.2 mmol/L  POCT I-STAT, CHEM 8     Status: Abnormal   Collection Time    06/30/13 12:31 AM      Result Value Range   Sodium 139  135 - 145 mEq/L   Potassium 3.9  3.5 - 5.1 mEq/L   Chloride 104  96 - 112 mEq/L   BUN 23  6 - 23 mg/dL   Creatinine, Ser 5.36 (*) 0.50 - 1.10 mg/dL   Glucose, Bld 98  70 - 99 mg/dL   Calcium, Ion 6.44  0.34 - 1.23 mmol/L   TCO2 25  0 - 100 mmol/L   Hemoglobin 13.3  12.0 - 15.0 g/dL   HCT 74.2  59.5 - 63.8 %    Physical Findings: AIMS: Facial and Oral Movements Muscles of Facial  Expression: None, normal Lips and  Perioral Area: None, normal Jaw: None, normal Tongue: None, normal,Extremity Movements Upper (arms, wrists, hands, fingers): None, normal Lower (legs, knees, ankles, toes): None, normal, Trunk Movements Neck, shoulders, hips: None, normal, Overall Severity Severity of abnormal movements (highest score from questions above): None, normal Incapacitation due to abnormal movements: None, normal Patient's awareness of abnormal movements (rate only patient's report): No Awareness, Dental Status Current problems with teeth and/or dentures?: No Does patient usually wear dentures?: No  CIWA:  CIWA-Ar Total: 3 COWS:     Treatment Plan Summary: Daily contact with patient to assess and evaluate symptoms and progress in treatment Medication management  Plan: Continue Librium protocol and medication management  Start Hydroxyzine 50 mg at bedtime for sleep if needed Treatment Plan/Recommendations:   1. Admit for crisis management and stabilization. 2. Medication management to reduce current symptoms to base line and improve the patient's overall level of functioning. 3. Treat health problems as indicated. 4. Develop treatment plan to decrease risk of relapse upon discharge and to reduce the need for readmission. 5. Psycho-social education regarding relapse prevention and self care. 6. Health care follow up as needed for medical problems. 7. Restart home medications where appropriate.   Medical Decision Making Problem Points:  Established problem, worsening (2), New problem, with additional work-up planned (4), Review of last therapy session (1) and Review of psycho-social stressors (1) Data Points:  Review or order clinical lab tests (1) Review or order medicine tests (1) Review of medication regiment & side effects (2) Review of new medications or change in dosage (2)  I certify that inpatient services furnished can reasonably be expected to improve the patient's condition.    Fraya Ueda,JANARDHAHA R. 06/30/2013, 2:52 PM

## 2013-06-30 NOTE — Progress Notes (Signed)
VASCULAR LAB PRELIMINARY  PRELIMINARY  PRELIMINARY  PRELIMINARY  Left lower extremity venous duplex completed.    Preliminary report:  Left:  No evidence of DVT, superficial thrombosis, or Baker's cyst.  There is an irregularly shaped area of fluid from the knee to the ankle.   Levell Tavano, RVT 06/30/2013, 12:23 PM

## 2013-06-30 NOTE — Progress Notes (Signed)
Adult Psychoeducational Group Note  Date:  06/30/2013 Time:  11:00am Group Topic/Focus:  Building Self Esteem:   The Focus of this group is helping patients become aware of the effects of self-esteem on their lives, the things they and others do that enhance or undermine their self-esteem, seeing the relationship between their level of self-esteem and the choices they make and learning ways to enhance self-esteem.  Participation Level:  Active  Participation Quality:  Appropriate and Attentive  Affect:  Appropriate  Cognitive:  Alert and Appropriate  Insight: Appropriate  Engagement in Group:  Engaged  Modes of Intervention:  Discussion and Education  Additional Comments:  Pt attended and participated in group. Discussion was about lifestyle changes. When ask what she would change pt stated staying away from alcohol.   [name] D 06/30/2013, 3:04 PM

## 2013-06-30 NOTE — Progress Notes (Signed)
D   Pt complained at the first of the shift that she was having symptoms of her skin crawling and felt itchy   She continues to complain of swollen leg   She is pleasant on approach and interacts well with others   She attends and participates in groups   Pt was concerned that the librium wasn't working for her withdrawal and wondered why she couldn't take the ativan A   Verbal support given  Discussed withdrawal symptoms with pt and encouraged her to take another dose of librium to help aleviate the withdrawal   Medications administered and effectiveness monitored   Q 15 min checks R   Pt is safe and reported relief from symptoms

## 2013-06-30 NOTE — Progress Notes (Signed)
Recreation Therapy Notes  Date: 10.09.2014 Time: 2:45pm Location: [address] Dayroom  Group Topic: Software engineer Activities (AAA)  Behavioral Response: Did not attend   [employer], LRT/CTRS  Jearl Klinefelter 06/30/2013 4:42 PM

## 2013-06-30 NOTE — [facility] Group Notes (Signed)
The focus of this group is to educate the patient on the purpose and policies of crisis stabilization and provide a format to answer questions about their admission.  The group details unit policies and expectations of patients while admitted.  Quiet and attentive during group. 

## 2013-06-30 NOTE — [facility] Group Notes (Signed)
[facility] LCSW Group Therapy  06/30/2013  1:15 PM   Type of Therapy:  Group Therapy  Participation Level:  Active  Participation Quality:  Appropriate and Attentive  Affect:  Appropriate, Flat and Depressed.    Cognitive:  Alert and Appropriate  Insight:  Developing/Improving and Engaged  Engagement in Therapy:  Developing/Improving and Engaged  Modes of Intervention:  Activity, Clarification, Confrontation, Discussion, Education, Exploration, Limit-setting, Orientation, Problem-solving, Rapport Building, Reality Testing, Socialization and Support  Summary of Progress/Problems: Patient was attentive and engaged with speaker from [employer].  Patient was attentive to speaker while they shared their story of dealing with mental health and overcoming it.  Patient expressed interest in their programs and services and received information on their agency.  Patient processed ways they can relate to the speaker.     Millisa Giarrusso Horton, LCSWA 06/30/2013 1:32 PM   

## 2013-06-30 NOTE — Progress Notes (Signed)
D: Patient appropriate and cooperative with staff and peers. Patient's affect is anxious and mood is depressed. She reported on the self inventory sheet that her sleep and appetite are both poor, energy level is low and ability to pay attention is improving. Patient rated depression "9" and feelings of hopelessness "8". She's attending and participating in groups. Patient is adhering to current medication regimen.  A: Support and encouragement provided to patient. Administered scheduled medications per ordering MD. Monitor Q15 minute checks for safety.  R: Patient receptive. Passive SI, but contracts for safety. Denies HI. Patient remains safe on the unit.

## 2013-06-30 NOTE — [facility] Suicide Risk Assessment (Signed)
[facility] INPATIENT:  Family/Significant Other Suicide Prevention Education  Suicide Prevention Education:  Patient Refusal for Family/Significant Other Suicide Prevention Education: The patient Kristina Huffman has refused to provide written consent for family/significant other to be provided Family/Significant Other Suicide Prevention Education during admission and/or prior to discharge.  Physician notified.  Patient advised her father is elderly.  Wynn Banker 06/30/2013, 8:36 AM
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
## 2013-07-01 MED ORDER — INFLUENZA VAC SPLIT QUAD 0.5 ML IM SUSP
0.5000 mL | INTRAMUSCULAR | Status: AC
  Administered 2013-07-02: 0.5 mL via INTRAMUSCULAR
  Filled 2013-07-01: qty 0.5

## 2013-07-01 MED ORDER — PNEUMOCOCCAL VAC POLYVALENT 25 MCG/0.5ML IJ INJ
0.5000 mL | INJECTION | INTRAMUSCULAR | Status: AC
  Administered 2013-07-02: 0.5 mL via INTRAMUSCULAR

## 2013-07-01 MED ORDER — LITHIUM CARBONATE ER 300 MG PO TBCR
300.0000 mg | EXTENDED_RELEASE_TABLET | Freq: Two times a day (BID) | ORAL | Status: DC
  Administered 2013-07-01 – 2013-07-04 (×6): 300 mg via ORAL
  Filled 2013-07-01: qty 1
  Filled 2013-07-01: qty 28
  Filled 2013-07-01: qty 1
  Filled 2013-07-01: qty 28
  Filled 2013-07-01 (×6): qty 1

## 2013-07-01 MED ORDER — FLUOXETINE HCL 20 MG PO CAPS
40.0000 mg | ORAL_CAPSULE | Freq: Every day | ORAL | Status: DC
Start: 2013-07-02 — End: 2013-07-02
  Filled 2013-07-01 (×4): qty 2

## 2013-07-01 NOTE — Progress Notes (Signed)
Patient ID: Kristina Huffman, female   DOB: [birth year], 52 y.o.   MRN: [id number] [facility] MD Progress Note  07/01/2013 12:35 PM Tashanda Fuhrer  MRN:  [id number]  Subjective:  The patient has complained about mood swings and requests mood stabilizer strength lithium and antidepressant like Effexor. Patient stated that her medication Prozac does not help. Patient also endorses that she has been drinking alcohol since her son keeping himself by overdosing heroine 2 years ago. Patient is not able to get along without her husband who was not sympathetic to her grieving. Patient reported she was completed this therapy but at the same time she has been drinking. Patient is tolerating with her current detox treatment program/ Librium protocol. Patient has been actively participating in unit.   Diagnosis:   DSM5: Schizophrenia Disorders:   Obsessive-Compulsive Disorders:   Trauma-Stressor Disorders:   Substance/Addictive Disorders:  Alcohol Intoxication (303.00) and Alcohol Withdrawal without Perceptual Disturbances (F10.239) Depressive Disorders:  Major Depressive Disorder - Severe (296.23)  Axis I: Bipolar, Depressed, Major Depression, Recurrent severe, Post Traumatic Stress Disorder, Substance Induced Mood Disorder and Alcohol dependency and benzodiazepine abuse  ADL's:  Impaired  Sleep: Poor  Appetite:  Fair  Suicidal Ideation:  Continued to have suicidal ideation but contracts for safety in the hospital  Homicidal Ideation:  Denied  AEB (as evidenced by):  Psychiatric Specialty Exam: ROS  Blood pressure 124/85, pulse 73, temperature 97.7 F (36.5 C), temperature source Oral, resp. rate 16, height 5\' 6"  (1.676 m), weight 96.163 kg (212 lb), last menstrual period 11/06/2012, SpO2 97.00%.Body mass index is 34.23 kg/(m^2).  General Appearance: Bizarre, Disheveled and Guarded  Eye Contact::  Fair  Speech:  Clear and Coherent  Volume:  Normal  Mood:  Anxious, Depressed, Dysphoric, Hopeless,  Irritable and Worthless  Affect:  Depressed and Flat  Thought Process:  Goal Directed and Intact  Orientation:  Full (Time, Place, and Person)  Thought Content:  Rumination  Suicidal Thoughts:  Yes.  without intent/plan  Homicidal Thoughts:  No  Memory:  Immediate;   Fair  Judgement:  Impaired  Insight:  Lacking  Psychomotor Activity:  Restlessness  Concentration:  Fair  Recall:  Fair  Akathisia:  Yes  Handed:  Right  AIMS (if indicated):     Assets:  Communication Skills Desire for Improvement Physical Health Resilience Social Support  Sleep:  Number of Hours: 6.25   Current Medications: Current Facility-Administered Medications  Medication Dose Route Frequency Provider Last Rate Last Dose  . acetaminophen (TYLENOL) tablet 650 mg  650 mg Oral Q6H PRN Larena Sox, MD      . albuterol (PROVENTIL HFA;VENTOLIN HFA) 108 (90 BASE) MCG/ACT inhaler 2 puff  2 puff Inhalation Q6H PRN Larena Sox, MD      . alum & mag hydroxide-simeth (MAALOX/MYLANTA) 200-200-20 MG/5ML suspension 30 mL  30 mL Oral Q4H PRN Larena Sox, MD      . chlordiazePOXIDE (LIBRIUM) capsule 25 mg  25 mg Oral Q6H PRN Larena Sox, MD   25 mg at 06/30/13 1941  . chlordiazePOXIDE (LIBRIUM) capsule 25 mg  25 mg Oral BH-qamhs Larena Sox, MD       Followed by  . [START ON 07/03/2013] chlordiazePOXIDE (LIBRIUM) capsule 25 mg  25 mg Oral Daily Larena Sox, MD      . FLUoxetine (PROZAC) capsule 20 mg  20 mg Oral Daily Sanjuana Kava, NP   20 mg at 07/01/13 0805  . hydrOXYzine (ATARAX/VISTARIL) tablet 50 mg  50 mg Oral QHS PRN Nehemiah Settle, MD   50 mg at 06/30/13 2156  . [START ON 07/02/2013] influenza vac split quadrivalent PF (FLUARIX) injection 0.5 mL  0.5 mL Intramuscular Tomorrow-1000 Nehemiah Settle, MD      . loperamide (IMODIUM) capsule 2-4 mg  2-4 mg Oral PRN Larena Sox, MD      . magnesium hydroxide (MILK OF MAGNESIA) suspension 30 mL  30 mL Oral Daily PRN  Larena Sox, MD      . methocarbamol (ROBAXIN) tablet 500 mg  500 mg Oral TID Sanjuana Kava, NP   500 mg at 07/01/13 1208  . metroNIDAZOLE (FLAGYL) tablet 500 mg  500 mg Oral Q12H Larena Sox, MD   500 mg at 07/01/13 0805  . multivitamin with minerals tablet 1 tablet  1 tablet Oral Daily Larena Sox, MD   1 tablet at 07/01/13 0805  . ondansetron (ZOFRAN-ODT) disintegrating tablet 4 mg  4 mg Oral Q6H PRN Larena Sox, MD      . Melene Muller ON 07/02/2013] pneumococcal 23 valent vaccine (PNU-IMMUNE) injection 0.5 mL  0.5 mL Intramuscular Tomorrow-1000 Nehemiah Settle, MD      . thiamine (VITAMIN B-1) tablet 100 mg  100 mg Oral Daily Larena Sox, MD   100 mg at 07/01/13 0804    Lab Results:  Results for orders placed during the hospital encounter of 06/28/13 (from the past 48 hour(s))  CBC WITH DIFFERENTIAL     Status: None   Collection Time    06/29/13 11:51 PM      Result Value Range   WBC 4.5  4.0 - 10.5 K/uL   RBC 4.13  3.87 - 5.11 MIL/uL   Hemoglobin 13.0  12.0 - 15.0 g/dL   HCT 16.1  09.6 - 04.5 %   MCV 93.7  78.0 - 100.0 fL   MCH 31.5  26.0 - 34.0 pg   MCHC 33.6  30.0 - 36.0 g/dL   RDW 40.9  81.1 - 91.4 %   Platelets 212  150 - 400 K/uL   Neutrophils Relative % 55  43 - 77 %   Neutro Abs 2.5  1.7 - 7.7 K/uL   Lymphocytes Relative 33  12 - 46 %   Lymphs Abs 1.5  0.7 - 4.0 K/uL   Monocytes Relative 7  3 - 12 %   Monocytes Absolute 0.3  0.1 - 1.0 K/uL   Eosinophils Relative 4  0 - 5 %   Eosinophils Absolute 0.2  0.0 - 0.7 K/uL   Basophils Relative 0  0 - 1 %   Basophils Absolute 0.0  0.0 - 0.1 K/uL  PROTIME-INR     Status: None   Collection Time    06/29/13 11:51 PM      Result Value Range   Prothrombin Time 12.4  11.6 - 15.2 seconds   INR 0.94  0.00 - 1.49  LACTIC ACID, PLASMA     Status: None   Collection Time    06/29/13 11:52 PM      Result Value Range   Lactic Acid, Venous 0.8  0.5 - 2.2 mmol/L  POCT I-STAT, CHEM 8     Status: Abnormal    Collection Time    06/30/13 12:31 AM      Result Value Range   Sodium 139  135 - 145 mEq/L   Potassium 3.9  3.5 - 5.1 mEq/L   Chloride 104  96 - 112  mEq/L   BUN 23  6 - 23 mg/dL   Creatinine, Ser 3.08 (*) 0.50 - 1.10 mg/dL   Glucose, Bld 98  70 - 99 mg/dL   Calcium, Ion 6.57  8.46 - 1.23 mmol/L   TCO2 25  0 - 100 mmol/L   Hemoglobin 13.3  12.0 - 15.0 g/dL   HCT 96.2  95.2 - 84.1 %    Physical Findings: AIMS: Facial and Oral Movements Muscles of Facial Expression: None, normal Lips and Perioral Area: None, normal Jaw: None, normal Tongue: None, normal,Extremity Movements Upper (arms, wrists, hands, fingers): None, normal Lower (legs, knees, ankles, toes): None, normal, Trunk Movements Neck, shoulders, hips: None, normal, Overall Severity Severity of abnormal movements (highest score from questions above): None, normal Incapacitation due to abnormal movements: None, normal Patient's awareness of abnormal movements (rate only patient's report): No Awareness, Dental Status Current problems with teeth and/or dentures?: No Does patient usually wear dentures?: No  CIWA:  CIWA-Ar Total: 2 COWS:     Treatment Plan Summary: Daily contact with patient to assess and evaluate symptoms and progress in treatment Medication management  Plan: Continue Librium protocol and medication management  Continue Hydroxyzine 50 mg at bedtime for sleep if needed Discontinue Prozac and start effexor as patient requested  Treatment Plan/Recommendations:   1. Admit for crisis management and stabilization. 2. Medication management to reduce current symptoms to base line and improve the patient's overall level of functioning. 3. Treat health problems as indicated. 4. Develop treatment plan to decrease risk of relapse upon discharge and to reduce the need for readmission. 5. Psycho-social education regarding relapse prevention and self care. 6. Health care follow up as needed for medical problems. 7.  Restart home medications where appropriate.  8. may be discharged on Monday when she can contract for safety    Medical Decision Making Problem Points:  Established problem, worsening (2), New problem, with additional work-up planned (4), Review of last therapy session (1) and Review of psycho-social stressors (1) Data Points:  Review or order clinical lab tests (1) Review or order medicine tests (1) Review of medication regiment & side effects (2) Review of new medications or change in dosage (2)  I certify that inpatient services furnished can reasonably be expected to improve the patient's condition.   Kainan Patty,JANARDHAHA R. 07/01/2013, 12:35 PM

## 2013-07-01 NOTE — [facility] Group Notes (Signed)
Adult Psychoeducational Group Note  Date:  07/01/2013 Time:  10:26 PM  Group Topic/Focus:  AA Meeting  Participation Level:  Active  Participation Quality:  Appropriate  Affect:  Appropriate  Cognitive:  Appropriate  Insight: Appropriate  Engagement in Group:  Engaged  Modes of Intervention:  Discussion  Additional Comments:  Zeva attended AA group.  Caroll Rancher A 07/01/2013, 10:26 PM

## 2013-07-01 NOTE — Progress Notes (Signed)
D: Pt mood has improved tonight. She reports that she feels better tonight after getting upset earlier today. She denies current SI/HI/AVH.  A: Support given. Verbalization encouraged. Pt encouraged to come to staff with any concerns.  R: Pt is receptive. No complaints of pain or discomfort. Will continue to monitor pt. Q15 min safety checks maintained.

## 2013-07-01 NOTE — Progress Notes (Signed)
D: Patient's affect is anxious and mood is irritable. Patient verbalized that she was tired of feeling like no one is listening to her or being helpful here at the facility; she voiced that she spoke with the doctor today and was told that changes would be made to her current medication regimen. Patient was told that two new medications would be added to her medication regimen and this did not occur, therefore her behavior escalated; she began yelling through the hallway. The PA, Verne Spurr went to speak with the patient in her room about her current issues or concerns with her care at our facility.  A: Support and encouragement provided to patient. Administered scheduled medications per ordering MD. Monitor Q15 minute checks for safety.  R: Patient receptive. Passive SI, but contracts for safety. Denies HI. Patient remains safe on the unit.

## 2013-07-01 NOTE — [facility] Group Notes (Addendum)
[facility] LCSW Group Therapy  Feelings Around Relapse 1:15 -2:30        07/01/2013  3:48 PM   Type of Therapy:  Group Therapy  Participation Level:  Appropriate  Participation Quality:  Appropriate  Affect:  Appropriate  Cognitive:  Attentive Appropriate  Insight:  Developing/Improving  Engagement in Therapy: Developing/Improving  Modes of Intervention:  Discussion Exploration Problem-Solving Supportive  Summary of Progress/Problems:  The topic for today was feelings around relapse.    Patient processed feelings toward relapse and was able to relate to peers. Patient identified coping skills that can be used to prevent a relapse.   Wynn Banker 07/01/2013 3:48 PM

## 2013-07-01 NOTE — [facility] Group Notes (Signed)
[facility] LCSW Aftercare Discharge Planning Group Note   07/01/2013 12:41 PM    Participation Quality:  Appropraite  Mood/Affect:  Appropriate, Depressed  Depression Rating:  8  Anxiety Rating:  8  Thoughts of Suicide:  No  Will you contract for safety?   NA  Current AVH:  No  Plan for Discharge/Comments:  Patient attending discharge planning group and actively participated in group.  She is scheduled for an admission assessment at [facility] on 07/06/13.  CSW provided all participants with daily workbook and information on services offered by [facility].   Transportation Means: Patient has transportation.   Supports:  Patient has a support system.   Keondra Haydu, Joesph July

## 2013-07-02 MED ORDER — VENLAFAXINE HCL 75 MG PO TABS
75.0000 mg | ORAL_TABLET | Freq: Every day | ORAL | Status: DC
  Administered 2013-07-02 – 2013-07-03 (×2): 75 mg via ORAL
  Filled 2013-07-02 (×2): qty 1
  Filled 2013-07-02: qty 14
  Filled 2013-07-02: qty 1

## 2013-07-02 MED ORDER — CLINDAMYCIN HCL 150 MG PO CAPS
300.0000 mg | ORAL_CAPSULE | Freq: Two times a day (BID) | ORAL | Status: DC
  Administered 2013-07-02 – 2013-07-04 (×4): 300 mg via ORAL
  Filled 2013-07-02: qty 20
  Filled 2013-07-02: qty 1
  Filled 2013-07-02: qty 20
  Filled 2013-07-02 (×2): qty 1
  Filled 2013-07-02: qty 20
  Filled 2013-07-02 (×2): qty 1

## 2013-07-02 NOTE — ED Provider Notes (Signed)
Medical screening examination/treatment/procedure(s) were performed by non-physician practitioner and as supervising physician I was immediately available for consultation/collaboration.   Junius Argyle, MD 07/02/13 1049

## 2013-07-02 NOTE — Progress Notes (Signed)
Patient ID: Kristina Huffman, female   DOB: Oct 19, 1960, 52 y.o.   MRN: [id number] Patient ID: Kristina Huffman, female   DOB: Apr 30, 1961, 52 y.o.   MRN: [id number] [facility] MD Progress Note  07/02/2013 2:33 PM Kristina Huffman  MRN:  [id number]  Subjective:  States still feeling down and effexor has helped in the past. Allergies prompted headache with effexor but no other untoward effect.  Objective: Tolerating detox. Complained of redness left calf. Ultrasound was done. Has been on clindamycin says was not able to get since admission. Mood not labile. Diagnosis:   DSM5: Schizophrenia Disorders:   Obsessive-Compulsive Disorders:   Trauma-Stressor Disorders:   Substance/Addictive Disorders:  Alcohol Intoxication (303.00) and Alcohol Withdrawal without Perceptual Disturbances (F10.239) Depressive Disorders:  Major Depressive Disorder - Severe (296.23)  Axis I: Bipolar, Depressed, Major Depression, Recurrent severe, Post Traumatic Stress Disorder, Substance Induced Mood Disorder and Alcohol dependency and benzodiazepine abuse  ADL's:  Impaired  Sleep: Poor  Appetite:  Fair  Suicidal Ideation:  Continued to have suicidal ideation but contracts for safety in the hospital  Homicidal Ideation:  Denied  AEB (as evidenced by):  Psychiatric Specialty Exam: ROS  Blood pressure 110/76, pulse 80, temperature 97.6 F (36.4 C), temperature source Oral, resp. rate 19, height 5\' 6"  (1.676 m), weight 96.163 kg (212 lb), last menstrual period 11/06/2012, SpO2 97.00%.Body mass index is 34.23 kg/(m^2).  General Appearance: disheleved  Eye Contact::  Fair  Speech:  Clear and Coherent  Volume:  Normal  Mood:  Down and guarded  Affect:  Depressed and Flat  Thought Process:  Goal Directed and Intact  Orientation:  Full (Time, Place, and Person)  Thought Content:  Rumination  Suicidal Thoughts:  Yes.  without intent/plan  Homicidal Thoughts:  No  Memory:  Immediate;   Fair  Judgement:  Impaired  Insight:   Lacking  Psychomotor Activity:  Restlessness  Concentration:  Fair  Recall:  Fair  Akathisia:  Yes  Handed:  Right  AIMS (if indicated):     Assets:  Communication Skills Desire for Improvement Physical Health Resilience Social Support  Sleep:  Number of Hours: 6.5   Current Medications: Current Facility-Administered Medications  Medication Dose Route Frequency Provider Last Rate Last Dose  . acetaminophen (TYLENOL) tablet 650 mg  650 mg Oral Q6H PRN Larena Sox, MD      . albuterol (PROVENTIL HFA;VENTOLIN HFA) 108 (90 BASE) MCG/ACT inhaler 2 puff  2 puff Inhalation Q6H PRN Larena Sox, MD      . alum & mag hydroxide-simeth (MAALOX/MYLANTA) 200-200-20 MG/5ML suspension 30 mL  30 mL Oral Q4H PRN Larena Sox, MD      . Melene Muller ON 07/03/2013] chlordiazePOXIDE (LIBRIUM) capsule 25 mg  25 mg Oral Daily Larena Sox, MD      . clindamycin (CLEOCIN) capsule 300 mg  300 mg Oral q12n4p Thresa Ross, MD      . FLUoxetine (PROZAC) capsule 40 mg  40 mg Oral Daily Nehemiah Settle, MD      . hydrOXYzine (ATARAX/VISTARIL) tablet 50 mg  50 mg Oral QHS PRN Nehemiah Settle, MD   50 mg at 07/01/13 2132  . lithium carbonate (LITHOBID) CR tablet 300 mg  300 mg Oral Q12H Verne Spurr, [name]   300 mg at 07/02/13 0865  . magnesium hydroxide (MILK OF MAGNESIA) suspension 30 mL  30 mL Oral Daily PRN Larena Sox, MD      . methocarbamol (ROBAXIN) tablet 500 mg  500 mg  Oral TID Sanjuana Kava, NP   500 mg at 07/02/13 1610  . metroNIDAZOLE (FLAGYL) tablet 500 mg  500 mg Oral Q12H Larena Sox, MD   500 mg at 07/02/13 9604  . multivitamin with minerals tablet 1 tablet  1 tablet Oral Daily Larena Sox, MD   1 tablet at 07/02/13 0824  . thiamine (VITAMIN B-1) tablet 100 mg  100 mg Oral Daily Larena Sox, MD   100 mg at 07/02/13 0825  . venlafaxine (EFFEXOR) tablet 75 mg  75 mg Oral QPC supper Thresa Ross, MD        Lab Results:  No results found for this  or any previous visit (from the past 48 hour(s)).  Physical Findings: AIMS: Facial and Oral Movements Muscles of Facial Expression: None, normal Lips and Perioral Area: None, normal Jaw: None, normal Tongue: None, normal,Extremity Movements Upper (arms, wrists, hands, fingers): None, normal Lower (legs, knees, ankles, toes): None, normal, Trunk Movements Neck, shoulders, hips: None, normal, Overall Severity Severity of abnormal movements (highest score from questions above): None, normal Incapacitation due to abnormal movements: None, normal Patient's awareness of abnormal movements (rate only patient's report): No Awareness, Dental Status Current problems with teeth and/or dentures?: No Does patient usually wear dentures?: No  CIWA:  CIWA-Ar Total: 0 COWS:     Treatment Plan Summary: Daily contact with patient to assess and evaluate symptoms and progress in treatment Medication management  Plan: Continue Librium protocol and medication management  Continue Hydroxyzine 50 mg at bedtime for sleep if needed Effexor was not started yesterday will initiate it today. Will stop prozac to avoid excessive serotonin. Also reinstate Clindamycin.  Treatment Plan/Recommendations:   1. Admit for crisis management and stabilization. 2. Medication management to reduce current symptoms to base line and improve the patient's overall level of functioning. 3. Treat health problems as indicated. 4. Develop treatment plan to decrease risk of relapse upon discharge and to reduce the need for readmission. 5. Psycho-social education regarding relapse prevention and self care. 6. Health care follow up as needed for medical problems. 7. Restart home medications where appropriate.  8. may be discharged on Monday when she can contract for safety    Medical Decision Making Problem Points:  Established problem, worsening (2), New problem, with additional work-up planned (4), Review of last therapy session  (1) and Review of psycho-social stressors (1) Data Points:  Review or order clinical lab tests (1) Review or order medicine tests (1) Review of medication regiment & side effects (2) Review of new medications or change in dosage (2)  I certify that inpatient services furnished can reasonably be expected to improve the patient's condition.   Shantana Christon 07/02/2013, 2:33 PM

## 2013-07-02 NOTE — Progress Notes (Signed)
D: Pt mood is depressed. She complained of feeling nauseous. She has been in bed for most of this shift with minimal interaction with her peers. Otherwise she is appropriate and cooperative.  A: Support given. Verbalization encouraged. Medications given as prescribed. Pt encouraged to come to nurse with any concerns.  R: Pt is responsive. No complaints of pain or discomfort. Will continue to monitor pt. Q15 min safety checks maintained

## 2013-07-02 NOTE — [facility] Group Notes (Signed)
[facility] Group Notes:  (Nursing/MHT/Case Management/Adjunct)  Date:  07/02/2013  Time:  1330  Type of Therapy:  Nurse Education  Participation Level:  Active  Participation Quality:  Appropriate and Attentive  Affect:  Appropriate  Cognitive:  Alert and Appropriate  Insight:  Improving  Engagement in Group:  Engaged  Modes of Intervention:  Education  Summary of Progress/Problems:  Kristina Huffman 07/02/2013, 3:01 PM

## 2013-07-02 NOTE — [facility] Group Notes (Signed)
[facility] Group Notes:  (Clinical Social Work)  07/02/2013     10-11AM  Summary of Progress/Problems:   The main focus of today's process group was for the patient to identify ways in which they have in the past sabotaged their own recovery. Motivational Interviewing was utilized to ask the group members what they get out of their substance use, and what reasons they may have for wanting to change.  The Stages of Change were explained using a handout, and patients identified where they currently are with regard to stages of change.  The patient expressed that she self medicates with alcohol especially when her depression and anxiety get "too bad" even though she knows even as she is getting the drink that each relapse is harder and harder to recover from.  She self sabotages by telling herself "I can't stand it" (referring to the anxiety) and convinces herself she must have the relief provided by the alcohol or Klonopin.  Type of Therapy:  Group Therapy - Process   Participation Level:  Active  Participation Quality:  Attentive  Affect:  Anxious, Blunted and Depressed  Cognitive:  Appropriate  Insight:  Developing/Improving  Engagement in Therapy:  Developing/Improving  Modes of Intervention:  Education, Support and Processing, Motivational Interviewing  Ambrose Mantle, LCSW 07/02/2013, 12:44 PM

## 2013-07-02 NOTE — Progress Notes (Signed)
Patient ID: Kristina Huffman, female   DOB: [birth year], 52 y.o.   MRN: [id number] D. Patient presents with depressed mood, affect appropriate. She states '' I'm feeling better today, less depressed '' Patient completed self inventory and continues to endorse feeling depressed, rating her mood on depression scale at 8/10 on self inventory, with 10 being worst depression, 1 being least. She continues to endorse feeling hopeless rating self at 8/10 on same scale. Patient has been interactive on the unit, visible in the milieu attending unit programming. Patient has been compliant with medications. She reports plans when discharge are : to stop drinking, pray and go to church , get a support network.  A.  Discussed healthy coping skills and allowed patient to ventilate, support and encouragement provided. R. Patient in no acute distress, attending unit programming and remains calm and cooperative at this time. No further voiced concerns at this time. Will continue to monitor q 15 minutes for safety.

## 2013-07-02 NOTE — [facility] Group Notes (Signed)
[facility] Group Notes:  (Nursing/MHT/Case Management/Adjunct)  Date:  07/02/2013  Time:  3:43 PM  Type of Therapy:  Psychoeducational Skills  Participation Level: appropriate, active  Participation Quality:  Appropriate  Affect:  Anxious and Depressed  Cognitive:  Alert  Insight:  Appropriate  Engagement in Group:  Engaged  Modes of Intervention:  Discussion, Education and Exploration  Summary of Progress/Problems:  Kristina Huffman 07/02/2013, 3:43 PM

## 2013-07-03 LAB — CK TOTAL AND CKMB (NOT AT ARMC)
CK, MB: 2 ng/mL (ref 0.3–4.0)
Relative Index: INVALID (ref 0.0–2.5)
Total CK: 54 U/L (ref 7–177)

## 2013-07-03 LAB — COMPREHENSIVE METABOLIC PANEL
ALT: 97 U/L — ABNORMAL HIGH (ref 0–35)
AST: 59 U/L — ABNORMAL HIGH (ref 0–37)
Albumin: 3.8 g/dL (ref 3.5–5.2)
Alkaline Phosphatase: 101 U/L (ref 39–117)
BUN: 12 mg/dL (ref 6–23)
CO2: 28 mEq/L (ref 19–32)
Calcium: 10.2 mg/dL (ref 8.4–10.5)
Chloride: 98 mEq/L (ref 96–112)
Creatinine, Ser: 0.9 mg/dL (ref 0.50–1.10)
GFR calc Af Amer: 85 mL/min — ABNORMAL LOW (ref >90)
GFR calc non Af Amer: 73 mL/min — ABNORMAL LOW (ref >90)
Glucose, Bld: 111 mg/dL — ABNORMAL HIGH (ref 70–99)
Potassium: 4 mEq/L (ref 3.5–5.1)
Sodium: 134 mEq/L — ABNORMAL LOW (ref 135–145)
Total Bilirubin: 0.8 mg/dL (ref 0.3–1.2)
Total Protein: 7.2 g/dL (ref 6.0–8.3)

## 2013-07-03 LAB — LITHIUM LEVEL: Lithium Lvl: <0.25 mEq/L — ABNORMAL LOW (ref 0.80–1.40)

## 2013-07-03 MED ORDER — IBUPROFEN 600 MG PO TABS
600.0000 mg | ORAL_TABLET | Freq: Four times a day (QID) | ORAL | Status: DC | PRN
  Administered 2013-07-03 – 2013-07-04 (×2): 600 mg via ORAL
  Filled 2013-07-03 (×2): qty 1

## 2013-07-03 MED ORDER — DIPHENHYDRAMINE HCL 25 MG PO CAPS
25.0000 mg | ORAL_CAPSULE | Freq: Four times a day (QID) | ORAL | Status: DC | PRN
  Administered 2013-07-03 – 2013-07-04 (×2): 25 mg via ORAL
  Filled 2013-07-03 (×2): qty 1

## 2013-07-03 MED ORDER — HYDROXYZINE HCL 25 MG PO TABS
25.0000 mg | ORAL_TABLET | Freq: Four times a day (QID) | ORAL | Status: DC | PRN
  Administered 2013-07-03 (×2): 25 mg via ORAL
  Filled 2013-07-03: qty 20
  Filled 2013-07-03 (×2): qty 1

## 2013-07-03 NOTE — Progress Notes (Signed)
Patient ID: Kristina Huffman, female   DOB: 06-01-1961, 52 y.o.   MRN: [id number] Pt with c/o redness and pain at the injection site of her flu vaccine on her RUE. Dr. Gilmore Laroche notified; new orders obtained. No noted distress in patient.

## 2013-07-03 NOTE — Progress Notes (Signed)
Patient ID: Kristina Huffman, female   DOB: [birth year], 52 y.o.   MRN: [id number]  D: Patient pleasant and cooperative with care, denies SI. Pt brightens on approach and interacts well with peers/staff on the unit.  A: Q 15 minute safety checks, encourage group participation and medication compliance. R: Patient compliant with medications, remains pleasant. No s/s of withdrawals.

## 2013-07-03 NOTE — Progress Notes (Signed)
Adult Psychoeducational Group Note  Date:  07/02/13 Time:  8:00 pm  Group Topic/Focus:  Wrap-Up Group:   The focus of this group is to help patients review their daily goal of treatment and discuss progress on daily workbooks.  Participation Level:  Did Not Attend   Modena Nunnery 07/03/2013, 12:25 AM

## 2013-07-03 NOTE — Progress Notes (Signed)
Patient ID: Landon Truax, female   DOB: [birth year], 52 y.o.   MRN: [id number] [facility] MD Progress Note  07/03/2013 2:06 PM Loral Campi  MRN:  [id number]  Subjective:  Feels better. Leg color improved. More alert and cooperative. Objective: Alert oriented. Tolerating medications. Redness improved around leg. Mood improved.Clindamycin has been started. Tolerating detox.  Mood not labile. Diagnosis:   DSM5: Schizophrenia Disorders:   Obsessive-Compulsive Disorders:   Trauma-Stressor Disorders:   Substance/Addictive Disorders:  Alcohol Intoxication (303.00) and Alcohol Withdrawal without Perceptual Disturbances (F10.239) Depressive Disorders:  Major Depressive Disorder - Severe (296.23)  Axis I: Bipolar, Depressed, Major Depression, Recurrent severe, Post Traumatic Stress Disorder, Substance Induced Mood Disorder and Alcohol dependency and benzodiazepine abuse  ADL's:  Impaired  Sleep: Poor  Appetite:  Fair  Suicidal Ideation:  Continued to have suicidal ideation but contracts for safety in the hospital  Homicidal Ideation:  Denied  AEB (as evidenced by):  Psychiatric Specialty Exam: ROS  Blood pressure 99/69, pulse 84, temperature 97.9 F (36.6 C), temperature source Oral, resp. rate 16, height 5\' 6"  (1.676 m), weight 96.163 kg (212 lb), last menstrual period 11/06/2012, SpO2 97.00%.Body mass index is 34.23 kg/(m^2).  General Appearance: disheleved  Eye Contact::  Fair  Speech:  Clear and Coherent  Volume:  Normal  Mood:  Down and guarded  Affect:  Depressed and Flat  Thought Process:  Goal Directed and Intact  Orientation:  Full (Time, Place, and Person)  Thought Content:  Rumination  Suicidal Thoughts:  Yes.  without intent/plan  Homicidal Thoughts:  No  Memory:  Immediate;   Fair  Judgement:  Impaired  Insight:  Lacking  Psychomotor Activity:  Restlessness  Concentration:  Fair  Recall:  Fair  Akathisia:  Yes  Handed:  Right  AIMS (if indicated):     Assets:   Communication Skills Desire for Improvement Physical Health Resilience Social Support  Sleep:  Number of Hours: 6   Current Medications: Current Facility-Administered Medications  Medication Dose Route Frequency Provider Last Rate Last Dose  . acetaminophen (TYLENOL) tablet 650 mg  650 mg Oral Q6H PRN Larena Sox, MD      . albuterol (PROVENTIL HFA;VENTOLIN HFA) 108 (90 BASE) MCG/ACT inhaler 2 puff  2 puff Inhalation Q6H PRN Larena Sox, MD      . alum & mag hydroxide-simeth (MAALOX/MYLANTA) 200-200-20 MG/5ML suspension 30 mL  30 mL Oral Q4H PRN Larena Sox, MD      . chlordiazePOXIDE (LIBRIUM) capsule 25 mg  25 mg Oral Daily Larena Sox, MD      . clindamycin (CLEOCIN) capsule 300 mg  300 mg Oral q12n4p Thresa Ross, MD   300 mg at 07/03/13 0840  . hydrOXYzine (ATARAX/VISTARIL) tablet 25 mg  25 mg Oral Q6H PRN Thresa Ross, MD   25 mg at 07/03/13 1250  . hydrOXYzine (ATARAX/VISTARIL) tablet 50 mg  50 mg Oral QHS PRN Nehemiah Settle, MD   50 mg at 07/02/13 2023  . lithium carbonate (LITHOBID) CR tablet 300 mg  300 mg Oral Q12H Verne Spurr, [name]   300 mg at 07/03/13 0840  . magnesium hydroxide (MILK OF MAGNESIA) suspension 30 mL  30 mL Oral Daily PRN Larena Sox, MD      . methocarbamol (ROBAXIN) tablet 500 mg  500 mg Oral TID Sanjuana Kava, NP   500 mg at 07/02/13 2345  . metroNIDAZOLE (FLAGYL) tablet 500 mg  500 mg Oral Q12H Larena Sox, MD  500 mg at 07/03/13 0840  . multivitamin with minerals tablet 1 tablet  1 tablet Oral Daily Larena Sox, MD   1 tablet at 07/03/13 0840  . thiamine (VITAMIN B-1) tablet 100 mg  100 mg Oral Daily Larena Sox, MD   100 mg at 07/03/13 0841  . venlafaxine [facility]) tablet 75 mg  75 mg Oral QPC supper Thresa Ross, MD   75 mg at 07/02/13 1725    Lab Results:  Results for orders placed during the hospital encounter of 06/28/13 (from the past 48 hour(s))  COMPREHENSIVE METABOLIC PANEL     Status:  Abnormal   Collection Time    07/03/13  7:02 AM      Result Value Range   Sodium 134 (*) 135 - 145 mEq/L   Potassium 4.0  3.5 - 5.1 mEq/L   Chloride 98  96 - 112 mEq/L   CO2 28  19 - 32 mEq/L   Glucose, Bld 111 (*) 70 - 99 mg/dL   BUN 12  6 - 23 mg/dL   Creatinine, Ser 8.11  0.50 - 1.10 mg/dL   Calcium 91.4  8.4 - 78.2 mg/dL   Total Protein 7.2  6.0 - 8.3 g/dL   Albumin 3.8  3.5 - 5.2 g/dL   AST 59 (*) 0 - 37 U/L   ALT 97 (*) 0 - 35 U/L   Alkaline Phosphatase 101  39 - 117 U/L   Total Bilirubin 0.8  0.3 - 1.2 mg/dL   GFR calc non Af Amer 73 (*) >90 mL/min   GFR calc Af Amer 85 (*) >90 mL/min   Comment: (NOTE)     The eGFR has been calculated using the CKD EPI equation.     This calculation has not been validated in all clinical situations.     eGFR's persistently <90 mL/min signify possible Chronic Kidney     Disease.     Performed at [facility]  CK TOTAL AND CKMB     Status: None   Collection Time    07/03/13  7:02 AM      Result Value Range   Total CK 54  7 - 177 U/L   CK, MB 2.0  0.3 - 4.0 ng/mL   Relative Index RELATIVE INDEX IS INVALID  0.0 - 2.5   Comment: WHEN CK < 100 U/L                Performed at [facility]  LITHIUM LEVEL     Status: Abnormal   Collection Time    07/03/13  7:02 AM      Result Value Range   Lithium Lvl <0.25 (*) 0.80 - 1.40 mEq/L   Comment: REPEATED TO VERIFY     Performed at [facility]    Physical Findings: AIMS: Facial and Oral Movements Muscles of Facial Expression: None, normal Lips and Perioral Area: None, normal Jaw: None, normal Tongue: None, normal,Extremity Movements Upper (arms, wrists, hands, fingers): None, normal Lower (legs, knees, ankles, toes): None, normal, Trunk Movements Neck, shoulders, hips: None, normal, Overall Severity Severity of abnormal movements (highest score from questions above): None, normal Incapacitation due to abnormal movements: None,  normal Patient's awareness of abnormal movements (rate only patient's report): No Awareness, Dental Status Current problems with teeth and/or dentures?: No Does patient usually wear dentures?: No  CIWA:  CIWA-Ar Total: 0 COWS:     Treatment Plan Summary: Daily contact with patient to assess  and evaluate symptoms and progress in treatment Medication management  Plan: Continue Librium protocol and medication management  Continue Hydroxyzine 50 mg at bedtime for sleep if needed Continue Effexor. Monitor vitals . Plan for discharge . Creatinine level latest came back normal.  Treatment Plan/Recommendations:   1. Admit for crisis management and stabilization. 2. Medication management to reduce current symptoms to base line and improve the patient's overall level of functioning. 3. Treat health problems as indicated. 4. Develop treatment plan to decrease risk of relapse upon discharge and to reduce the need for readmission. 5. Psycho-social education regarding relapse prevention and self care. 6. Health care follow up as needed for medical problems. 7. Restart home medications where appropriate.  8. may be discharged on Monday when she can contract for safety    Medical Decision Making Problem Points:  Established problem, worsening (2), New problem, with additional work-up planned (4), Review of last therapy session (1) and Review of psycho-social stressors (1) Data Points:  Review or order clinical lab tests (1) Review or order medicine tests (1) Review of medication regiment & side effects (2) Review of new medications or change in dosage (2)  I certify that inpatient services furnished can reasonably be expected to improve the patient's condition.   Taneshia Lorence 07/03/2013, 2:06 PM

## 2013-07-03 NOTE — Progress Notes (Addendum)
Patient ID: Kristina Huffman, female   DOB: Jun 19, 1961, 52 y.o.   MRN: [id number] D. Patient presents with depressed mood, affect appropriate. She states ''I'm feeling better today, really I'm not even depressed. I'm just dealing with my stomach , feeling a little queasy. ''  Patient completed self inventory , rating her mood on depression scale at 3/10 on self inventory, with 10 being worst depression, 1 being least. She continues to endorse feeling hopeless rating self at 3/10 on same scale. Patient declined am librium, and requested vistaril instead stating '' I don't like how drowsy the medications make me feel, I'd rather have something not as potent '' Patient has been interactive on the unit, visible in the milieu attending unit programming.  A. Discussed healthy coping skills and allowed patient to ventilate, support and encouragement provided.Discussed Above with MD , and patients requests for vistaril , orders received. R. Patient in no acute distress, attending unit programming and remains calm and cooperative at this time. No further voiced concerns at this time. Will continue to monitor q 15 minutes for safety.

## 2013-07-03 NOTE — [facility] Group Notes (Signed)
[facility] Group Notes:  (Nursing/MHT/Case Management/Adjunct)  Date:  07/03/2013  Time:  0930  Type of Therapy:  Psychoeducational Skills  Participation Level:  Active  Participation Quality:  Appropriate  Affect:  Anxious  Cognitive:  Alert  Insight:  Appropriate  Engagement in Group:  Distracting  Modes of Intervention:  Discussion, Education and Exploration  Summary of Progress/Problems: Healthy support systems reviewed , healthy coping skills reviewed and self inventory reviewed with RN. Patient attended , but got up several times during group. She participated when called on.  Malva Limes 07/03/2013, 2:26 PM

## 2013-07-03 NOTE — [facility] Group Notes (Signed)
[facility] Group Notes:  (Clinical Social Work)  07/03/2013  10:00-11:00AM  Summary of Progress/Problems:   The main focus of today's process group was to   identify the patient's current support system and decide on other supports that can be put in place.  The picture on workbook was used to discuss why additional supports are needed, and a hand-out was distributed with four definitions/levels of support, then used to talk about how patients have given and received all different kinds of support.  An emphasis was placed on using counselor, doctor, therapy groups, 12-step groups, and problem-specific support groups to expand supports.  The patient identified her father as being her only current support, and talked a little about her life situation wherein she has a poor living situation. She was active throughout the discussion and was very supportive of others.  She recognizes the need to look for additional supports as opposed to thinking they will come to her.  Type of Therapy:  Process Group with Motivational Interviewing  Participation Level:  Active  Participation Quality:  Attentive, Sharing and Supportive  Affect:  Anxious  Cognitive:  Appropriate and Oriented  Insight:  Engaged  Engagement in Therapy:  Engaged  Modes of Intervention:   Education, Support and Processing, Activity  [employer], LCSW 07/03/2013, 12:27 PM

## 2013-07-03 NOTE — Progress Notes (Signed)
Patient did attend the evening speaker AA meeting.  

## 2013-07-04 DIAGNOSIS — F1010 Alcohol abuse, uncomplicated: Secondary | ICD-10-CM

## 2013-07-04 DIAGNOSIS — F1910 Other psychoactive substance abuse, uncomplicated: Secondary | ICD-10-CM

## 2013-07-04 DIAGNOSIS — F411 Generalized anxiety disorder: Secondary | ICD-10-CM

## 2013-07-04 MED ORDER — PREDNISONE 20 MG PO TABS
20.0000 mg | ORAL_TABLET | Freq: Every day | ORAL | Status: DC
  Filled 2013-07-04: qty 1

## 2013-07-04 MED ORDER — HYDROXYZINE HCL 50 MG PO TABS: 50.0000 mg | ORAL_TABLET | Freq: Every evening | ORAL | Status: DC | PRN

## 2013-07-04 MED ORDER — LITHIUM CARBONATE ER 300 MG PO TBCR: 300.0000 mg | EXTENDED_RELEASE_TABLET | Freq: Two times a day (BID) | ORAL | Status: DC

## 2013-07-04 MED ORDER — PREDNISONE 20 MG PO TABS
40.0000 mg | ORAL_TABLET | Freq: Once | ORAL | Status: DC
  Filled 2013-07-04 (×2): qty 2

## 2013-07-04 MED ORDER — VENLAFAXINE HCL 75 MG PO TABS: 75.0000 mg | ORAL_TABLET | Freq: Every day | ORAL | Status: DC

## 2013-07-04 MED ORDER — DIPHENHYDRAMINE HCL 25 MG PO CAPS: 25.0000 mg | ORAL_CAPSULE | Freq: Four times a day (QID) | ORAL | Status: DC | PRN

## 2013-07-04 MED ORDER — CLINDAMYCIN HCL 300 MG PO CAPS: 300.0000 mg | ORAL_CAPSULE | Freq: Two times a day (BID) | ORAL | Status: DC

## 2013-07-04 MED ORDER — HYDROXYZINE HCL 25 MG PO TABS: 25.0000 mg | ORAL_TABLET | Freq: Four times a day (QID) | ORAL | Status: DC | PRN

## 2013-07-04 MED ORDER — ALBUTEROL SULFATE HFA 108 (90 BASE) MCG/ACT IN AERS: 2.0000 | INHALATION_SPRAY | Freq: Four times a day (QID) | RESPIRATORY_TRACT | Status: DC | PRN

## 2013-07-04 MED ORDER — PREDNISONE 10 MG PO TABS: 10.0000 mg | ORAL_TABLET | Freq: Every day | ORAL | Status: DC

## 2013-07-04 MED ORDER — METRONIDAZOLE 500 MG PO TABS: 500.0000 mg | ORAL_TABLET | Freq: Two times a day (BID) | ORAL | Status: DC

## 2013-07-04 NOTE — Progress Notes (Signed)
Chaplain provided follow up support with pt around discharge plans - especially anxiety related to caring for father.  Pt expressed confidence in plan to go to [facility] and spoke with chaplain about strategies she had been using to speak with father about her needs in recovery.

## 2013-07-04 NOTE — Progress Notes (Signed)
D/C instructions/meds/follow-up appointments reviewed, pt verbalized understanding, pt's belongings returned to pt, samples given, bus pass given. 

## 2013-07-04 NOTE — Progress Notes (Signed)
[facility] Adult Case Management Discharge Plan :  Will you be returning to the same living situation after discharge: Yes,  returning home At discharge, do you have transportation home?:Yes,  provided pt with bus pass or cab voucher Do you have the ability to pay for your medications:Yes,  access to meds  Release of information consent forms completed and in the chart;  Patient's signature needed at discharge.  Patient to Follow up at: Follow-up Information   Follow up with [facility] On 07/06/2013. (You are scheduled for an admission assessment on Wednesday, July 06, 2013)    Contact information:   [address] [place], [place]   [address]  [phone]      Follow up with [facility] On 07/07/2013. ([facility] on Thursday, Octoberf16, 2014 or any weekday between 8AM-12:00 noon if you are not accepted to [facility])    Contact information:   [address] [place], [place]   [address]  [phone]      Patient denies SI/HI:   Yes,  denies SI/HI    Safety Planning and Suicide Prevention discussed:  Yes,  dicussed with pt, pt refused consent to contact family/friend.  See suicide prevention education note.  Carmina Miller 07/04/2013, 11:55 AM
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
## 2013-07-04 NOTE — Progress Notes (Signed)
Adult Psychoeducational Group Note  Date:  07/04/2013 Time:  1:39 PM  Group Topic/Focus:  Self Care:   The focus of this group is to help patients understand the importance of self-care in order to improve or restore emotional, physical, spiritual, interpersonal, and financial health.  Participation Level:  Active  Participation Quality:  Appropriate, Sharing and Supportive  Affect:  Appropriate  Cognitive:  Appropriate  Insight: Appropriate  Engagement in Group:  Engaged  Modes of Intervention:  Activity and Discussion  Additional Comments:   In addition to completing the self-care worksheets pts watched a video "The Sober Life-My Behavior" as this relates to wellness and self-care. Pts engaged in discussion on how they could relate to the topics presented on the video.    Tora Perches N 07/04/2013, 1:39 PM

## 2013-07-07 NOTE — Progress Notes (Signed)
Patient Discharge Instructions:  After Visit Summary (AVS):   Faxed to:  07/07/13 Discharge Summary Note:   Faxed to:  07/07/13 Psychiatric Admission Assessment Note:   Faxed to:  07/07/13 Suicide Risk Assessment - Discharge Assessment:   Faxed to:  07/07/13 Faxed/Sent to the Next Level Care provider:  07/07/13 Faxed to [facility] of the [place] @ [phone] Faxed to  [facility] @ [phone] Jerelene Redden, 07/07/2013, 3:05 PM

## 2013-08-02 ENCOUNTER — Emergency Department (HOSPITAL_BASED_OUTPATIENT_CLINIC_OR_DEPARTMENT_OTHER)
Admission: EM | Admit: 2013-08-02 | Discharge: 2013-08-02 | Disposition: A | Discharge status: Home / Self Care | Payer: Self-pay | Attending: Emergency Medicine | Admitting: Emergency Medicine

## 2013-08-02 ENCOUNTER — Emergency Department (HOSPITAL_BASED_OUTPATIENT_CLINIC_OR_DEPARTMENT_OTHER): Payer: Self-pay

## 2013-08-02 ENCOUNTER — Encounter (HOSPITAL_BASED_OUTPATIENT_CLINIC_OR_DEPARTMENT_OTHER): Payer: Self-pay | Admitting: Emergency Medicine

## 2013-08-02 DIAGNOSIS — Z79899 Other long term (current) drug therapy: Secondary | ICD-10-CM | POA: Insufficient documentation

## 2013-08-02 DIAGNOSIS — Z792 Long term (current) use of antibiotics: Secondary | ICD-10-CM | POA: Insufficient documentation

## 2013-08-02 DIAGNOSIS — F3289 Other specified depressive episodes: Secondary | ICD-10-CM | POA: Insufficient documentation

## 2013-08-02 DIAGNOSIS — F329 Major depressive disorder, single episode, unspecified: Secondary | ICD-10-CM | POA: Insufficient documentation

## 2013-08-02 DIAGNOSIS — Z8614 Personal history of Methicillin resistant Staphylococcus aureus infection: Secondary | ICD-10-CM | POA: Insufficient documentation

## 2013-08-02 DIAGNOSIS — F411 Generalized anxiety disorder: Secondary | ICD-10-CM | POA: Insufficient documentation

## 2013-08-02 DIAGNOSIS — R002 Palpitations: Secondary | ICD-10-CM | POA: Insufficient documentation

## 2013-08-02 DIAGNOSIS — J45901 Unspecified asthma with (acute) exacerbation: Secondary | ICD-10-CM | POA: Insufficient documentation

## 2013-08-02 LAB — CBC WITH DIFFERENTIAL/PLATELET
Basophils Absolute: 0 10*3/uL (ref 0.0–0.1)
Basophils Relative: 1 % (ref 0–1)
Eosinophils Absolute: 0.1 10*3/uL (ref 0.0–0.7)
Eosinophils Relative: 2 % (ref 0–5)
HCT: 43.9 % (ref 36.0–46.0)
Hemoglobin: 14.8 g/dL (ref 12.0–15.0)
Lymphocytes Relative: 34 % (ref 12–46)
Lymphs Abs: 2 10*3/uL (ref 0.7–4.0)
MCH: 30.9 pg (ref 26.0–34.0)
MCHC: 33.7 g/dL (ref 30.0–36.0)
MCV: 91.6 fL (ref 78.0–100.0)
Monocytes Absolute: 0.4 10*3/uL (ref 0.1–1.0)
Monocytes Relative: 7 % (ref 3–12)
Neutro Abs: 3.3 10*3/uL (ref 1.7–7.7)
Neutrophils Relative %: 56 % (ref 43–77)
Platelets: 271 10*3/uL (ref 150–400)
RBC: 4.79 MIL/uL (ref 3.87–5.11)
RDW: 13 % (ref 11.5–15.5)
WBC: 5.9 10*3/uL (ref 4.0–10.5)

## 2013-08-02 LAB — TROPONIN I: Troponin I: <0.3 ng/mL (ref <0.30)

## 2013-08-02 LAB — BASIC METABOLIC PANEL
BUN: 17 mg/dL (ref 6–23)
CO2: 27 mEq/L (ref 19–32)
Calcium: 9.9 mg/dL (ref 8.4–10.5)
Chloride: 101 mEq/L (ref 96–112)
Creatinine, Ser: 1 mg/dL (ref 0.50–1.10)
GFR calc Af Amer: 75 mL/min — ABNORMAL LOW (ref >90)
GFR calc non Af Amer: 65 mL/min — ABNORMAL LOW (ref >90)
Glucose, Bld: 130 mg/dL — ABNORMAL HIGH (ref 70–99)
Potassium: 4.1 mEq/L (ref 3.5–5.1)
Sodium: 139 mEq/L (ref 135–145)

## 2013-08-02 NOTE — ED Provider Notes (Signed)
CSN: [id number]     Arrival date & time 08/02/13  1846 History   First MD Initiated Contact with Patient 08/02/13 1905     Chief Complaint  Patient presents with  . Irregular Heart Beat   (Consider location/radiation/quality/duration/timing/severity/associated sxs/prior Treatment) Patient is a 52 y.o. female presenting with chest pain. The history is provided by the patient. No language interpreter was used.  Chest Pain Pain location:  L chest Pain quality: aching   Pain radiates to:  Does not radiate Pain radiates to the back: no   Pain severity:  Mild Onset quality:  Gradual Timing:  Constant Progression:  Worsening Context: not breathing and no movement   Relieved by:  Nothing Worsened by:  Nothing tried Ineffective treatments:  None tried  Pt reports her heart has been beating irregularly all day.  Pt reports she has been at [facility] for 10 days.   Pt reports she thinks cigarette smoke exposure has caused her to be short of breath.  Pt reports she thinks she may be anxious and having some withdrawal.   Past Medical History  Diagnosis Date  . Depression   . Asthma   . Hepatitis C   . Heroin abuse   . ETOH abuse   . Anxiety   . History of MRSA infection     legs and spread to face   Past Surgical History  Procedure Laterality Date  . Back surgery    . Radial head arthroplasty  08/09/2012    Procedure: RADIAL HEAD ARTHROPLASTY;  Surgeon: Marlowe Shores, MD;  Location: [facility] OR;  Service: Orthopedics;  Laterality: Left;  Left Radial head Replacement   No family history on file. History  Substance Use Topics  . Smoking status: Never Smoker   . Smokeless tobacco: Never Used  . Alcohol Use: 3.6 oz/week    6 Glasses of wine per week     Comment: varies between beer and wine   OB History   Grav Para Term Preterm Abortions TAB SAB Ect Mult Living                 Review of Systems  Cardiovascular: Positive for chest pain.  All other systems reviewed and are  negative.    Allergies  Sulfa antibiotics and Aspirin  Home Medications   Current Outpatient Rx  Name  Route  Sig  Dispense  Refill  . albuterol (PROVENTIL HFA;VENTOLIN HFA) 108 (90 BASE) MCG/ACT inhaler   Inhalation   Inhale 2 puffs into the lungs every 6 (six) hours as needed for wheezing.   1 Inhaler   0   . clindamycin (CLEOCIN) 300 MG capsule   Oral   Take 1 capsule (300 mg total) by mouth 2 times daily at 12 noon and 4 pm.   10 capsule   0   . diphenhydrAMINE (BENADRYL) 25 mg capsule   Oral   Take 1 capsule (25 mg total) by mouth every 6 (six) hours as needed for itching.   30 capsule   0   . hydrOXYzine (ATARAX/VISTARIL) 25 MG tablet   Oral   Take 1 tablet (25 mg total) by mouth every 6 (six) hours as needed for anxiety.   30 tablet   0   . hydrOXYzine (ATARAX/VISTARIL) 50 MG tablet   Oral   Take 1 tablet (50 mg total) by mouth at bedtime as needed for anxiety.   30 tablet   0   . lithium carbonate (LITHOBID) 300 MG CR tablet  Oral   Take 1 tablet (300 mg total) by mouth every 12 (twelve) hours.   60 tablet   0   . metroNIDAZOLE (FLAGYL) 500 MG tablet   Oral   Take 1 tablet (500 mg total) by mouth every 12 (twelve) hours.   4 tablet   0   . venlafaxine (EFFEXOR) 75 MG tablet   Oral   Take 1 tablet (75 mg total) by mouth daily after supper.   30 tablet   0    BP 149/93  Pulse 85  Temp(Src) 98.2 F (36.8 C) (Oral)  Resp 16  Ht 5\' 6"  (1.676 m)  Wt 210 lb (95.255 kg)  BMI 33.91 kg/m2  SpO2 95% Physical Exam  Nursing note and vitals reviewed. Constitutional: She is oriented to person, place, and time. She appears well-developed and well-nourished.  HENT:  Head: Normocephalic.  Right Ear: External ear normal.  Left Ear: External ear normal.  Nose: Nose normal.  Mouth/Throat: Oropharynx is clear and moist.  Eyes: Pupils are equal, round, and reactive to light.  Neck: Normal range of motion.  Cardiovascular: Normal rate and regular  rhythm.   Abdominal: Soft. Bowel sounds are normal.  Musculoskeletal: Normal range of motion.  Neurological: She is alert and oriented to person, place, and time. She has normal reflexes.  Skin: Skin is warm.  Psychiatric: She has a normal mood and affect.    ED Course  Procedures (including critical care time) Labs Review Labs Reviewed  BASIC METABOLIC PANEL - Abnormal; Notable for the following:    Glucose, Bld 130 (*)    GFR calc non Af Amer 65 (*)    GFR calc Af Amer 75 (*)    All other components within normal limits  CBC WITH DIFFERENTIAL  TROPONIN I   Imaging Review Dg Chest 2 View  08/02/2013   CLINICAL DATA:  Left chest pain.  Palpitations.  EXAM: CHEST  2 VIEW  COMPARISON:  06/06/2013.  FINDINGS: Normal sized heart.  Clear lungs.  Unremarkable bones.  IMPRESSION: No acute abnormality.   Electronically Signed   By: Gordan Payment M.D.   On: 08/02/2013 20:35    EKG Interpretation     Ventricular Rate:  90 PR Interval:  148 QRS Duration: 80 QT Interval:  346 QTC Calculation: 423 R Axis:   50 Text Interpretation:  Normal sinus rhythm Low voltage QRS Cannot rule out Anterior infarct , age undetermined Abnormal ECG No significant change since last tracing            MDM  No diagnosis found. Results for orders placed during the hospital encounter of 08/02/13  CBC WITH DIFFERENTIAL      Result Value Range   WBC 5.9  4.0 - 10.5 K/uL   RBC 4.79  3.87 - 5.11 MIL/uL   Hemoglobin 14.8  12.0 - 15.0 g/dL   HCT 40.9  81.1 - 91.4 %   MCV 91.6  78.0 - 100.0 fL   MCH 30.9  26.0 - 34.0 pg   MCHC 33.7  30.0 - 36.0 g/dL   RDW 78.2  95.6 - 21.3 %   Platelets 271  150 - 400 K/uL   Neutrophils Relative % 56  43 - 77 %   Neutro Abs 3.3  1.7 - 7.7 K/uL   Lymphocytes Relative 34  12 - 46 %   Lymphs Abs 2.0  0.7 - 4.0 K/uL   Monocytes Relative 7  3 - 12 %   Monocytes Absolute  0.4  0.1 - 1.0 K/uL   Eosinophils Relative 2  0 - 5 %   Eosinophils Absolute 0.1  0.0 - 0.7 K/uL    Basophils Relative 1  0 - 1 %   Basophils Absolute 0.0  0.0 - 0.1 K/uL  BASIC METABOLIC PANEL      Result Value Range   Sodium 139  135 - 145 mEq/L   Potassium 4.1  3.5 - 5.1 mEq/L   Chloride 101  96 - 112 mEq/L   CO2 27  19 - 32 mEq/L   Glucose, Bld 130 (*) 70 - 99 mg/dL   BUN 17  6 - 23 mg/dL   Creatinine, Ser 2.95  0.50 - 1.10 mg/dL   Calcium 9.9  8.4 - 28.4 mg/dL   GFR calc non Af Amer 65 (*) >90 mL/min   GFR calc Af Amer 75 (*) >90 mL/min  TROPONIN I      Result Value Range   Troponin I <0.30  <0.30 ng/mL   Dg Chest 2 View  08/02/2013   CLINICAL DATA:  Left chest pain.  Palpitations.  EXAM: CHEST  2 VIEW  COMPARISON:  06/06/2013.  FINDINGS: Normal sized heart.  Clear lungs.  Unremarkable bones.  IMPRESSION: No acute abnormality.   Electronically Signed   By: Gordan Payment M.D.   On: 08/02/2013 20:35   Pt observed on monitor no irregularity,  Normal sinus    Elson Areas, [place] 08/02/13 2122

## 2013-08-02 NOTE — ED Notes (Signed)
Pt at [facility] x 10 days. Here with irregular heartbeat all day. States she was taken off Clonopin after taking it for 8 years. Denies pain.

## 2013-08-02 NOTE — ED Provider Notes (Signed)
Medical screening examination/treatment/procedure(s) were performed by non-physician practitioner and as supervising physician I was immediately available for consultation/collaboration.    Celene Kras, MD 08/02/13 [phone]

## 2013-09-19 ENCOUNTER — Encounter (HOSPITAL_COMMUNITY): Payer: Self-pay | Admitting: Emergency Medicine

## 2013-09-19 ENCOUNTER — Emergency Department (HOSPITAL_COMMUNITY)
Admission: EM | Admit: 2013-09-19 | Discharge: 2013-09-20 | Disposition: A | Discharge status: Other Acute Inpatient Hospital | Payer: Self-pay | Attending: Emergency Medicine | Admitting: Emergency Medicine

## 2013-09-19 DIAGNOSIS — Z8614 Personal history of Methicillin resistant Staphylococcus aureus infection: Secondary | ICD-10-CM | POA: Insufficient documentation

## 2013-09-19 DIAGNOSIS — F1994 Other psychoactive substance use, unspecified with psychoactive substance-induced mood disorder: Secondary | ICD-10-CM | POA: Insufficient documentation

## 2013-09-19 DIAGNOSIS — R45851 Suicidal ideations: Secondary | ICD-10-CM | POA: Insufficient documentation

## 2013-09-19 DIAGNOSIS — IMO0002 Reserved for concepts with insufficient information to code with codable children: Secondary | ICD-10-CM | POA: Insufficient documentation

## 2013-09-19 DIAGNOSIS — Z79899 Other long term (current) drug therapy: Secondary | ICD-10-CM | POA: Insufficient documentation

## 2013-09-19 DIAGNOSIS — J45909 Unspecified asthma, uncomplicated: Secondary | ICD-10-CM | POA: Insufficient documentation

## 2013-09-19 DIAGNOSIS — R45850 Homicidal ideations: Secondary | ICD-10-CM | POA: Insufficient documentation

## 2013-09-19 DIAGNOSIS — F1210 Cannabis abuse, uncomplicated: Secondary | ICD-10-CM | POA: Insufficient documentation

## 2013-09-19 DIAGNOSIS — F1110 Opioid abuse, uncomplicated: Secondary | ICD-10-CM | POA: Insufficient documentation
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
## 2013-09-20 ENCOUNTER — Encounter (HOSPITAL_COMMUNITY): Payer: Self-pay

## 2013-09-20 ENCOUNTER — Inpatient Hospital Stay (HOSPITAL_COMMUNITY)
Admission: RE | Admit: 2013-09-20 | Discharge: 2013-09-23 | DRG: 897 | Disposition: A | Discharge status: Home / Self Care | Payer: Federal, State, Local not specified - Other | Source: Intra-hospital | Attending: Psychiatry | Admitting: Psychiatry

## 2013-09-20 DIAGNOSIS — J45909 Unspecified asthma, uncomplicated: Secondary | ICD-10-CM | POA: Diagnosis present

## 2013-09-20 DIAGNOSIS — F4329 Adjustment disorder with other symptoms: Secondary | ICD-10-CM

## 2013-09-20 DIAGNOSIS — F1320 Sedative, hypnotic or anxiolytic dependence, uncomplicated: Secondary | ICD-10-CM | POA: Diagnosis present

## 2013-09-20 DIAGNOSIS — F1020 Alcohol dependence, uncomplicated: Principal | ICD-10-CM | POA: Diagnosis present

## 2013-09-20 DIAGNOSIS — Z79899 Other long term (current) drug therapy: Secondary | ICD-10-CM

## 2013-09-20 DIAGNOSIS — R45850 Homicidal ideations: Secondary | ICD-10-CM

## 2013-09-20 DIAGNOSIS — R45851 Suicidal ideations: Secondary | ICD-10-CM

## 2013-09-20 DIAGNOSIS — F1994 Other psychoactive substance use, unspecified with psychoactive substance-induced mood disorder: Secondary | ICD-10-CM | POA: Diagnosis present

## 2013-09-20 DIAGNOSIS — F3162 Bipolar disorder, current episode mixed, moderate: Secondary | ICD-10-CM | POA: Diagnosis present

## 2013-09-20 DIAGNOSIS — F4310 Post-traumatic stress disorder, unspecified: Secondary | ICD-10-CM | POA: Diagnosis present

## 2013-09-20 DIAGNOSIS — B1920 Unspecified viral hepatitis C without hepatic coma: Secondary | ICD-10-CM | POA: Diagnosis present

## 2013-09-20 LAB — RAPID URINE DRUG SCREEN, HOSP PERFORMED
Amphetamines: NOT DETECTED
Barbiturates: NOT DETECTED
Benzodiazepines: NOT DETECTED
Cocaine: POSITIVE — AB
Opiates: NOT DETECTED
Tetrahydrocannabinol: NOT DETECTED

## 2013-09-20 LAB — COMPREHENSIVE METABOLIC PANEL WITH GFR
ALT: 99 U/L — ABNORMAL HIGH (ref 0–35)
AST: 72 U/L — ABNORMAL HIGH (ref 0–37)
Albumin: 4.2 g/dL (ref 3.5–5.2)
Alkaline Phosphatase: 114 U/L (ref 39–117)
BUN: 14 mg/dL (ref 6–23)
CO2: 22 meq/L (ref 19–32)
Calcium: 9.7 mg/dL (ref 8.4–10.5)
Chloride: 104 meq/L (ref 96–112)
Creatinine, Ser: 0.91 mg/dL (ref 0.50–1.10)
GFR calc Af Amer: 84 mL/min — ABNORMAL LOW
GFR calc non Af Amer: 72 mL/min — ABNORMAL LOW
Glucose, Bld: 106 mg/dL — ABNORMAL HIGH (ref 70–99)
Potassium: 4.2 meq/L (ref 3.7–5.3)
Sodium: 142 meq/L (ref 137–147)
Total Bilirubin: 0.4 mg/dL (ref 0.3–1.2)
Total Protein: 7.7 g/dL (ref 6.0–8.3)

## 2013-09-20 LAB — CBC
HCT: 45.8 % (ref 36.0–46.0)
Hemoglobin: 15.6 g/dL — ABNORMAL HIGH (ref 12.0–15.0)
MCH: 30.4 pg (ref 26.0–34.0)
MCHC: 34.1 g/dL (ref 30.0–36.0)
MCV: 89.3 fL (ref 78.0–100.0)
Platelets: 320 K/uL (ref 150–400)
RBC: 5.13 MIL/uL — ABNORMAL HIGH (ref 3.87–5.11)
RDW: 12.9 % (ref 11.5–15.5)
WBC: 5.8 K/uL (ref 4.0–10.5)

## 2013-09-20 LAB — ETHANOL: Alcohol, Ethyl (B): 102 mg/dL — ABNORMAL HIGH (ref 0–11)

## 2013-09-20 LAB — ACETAMINOPHEN LEVEL: Acetaminophen (Tylenol), Serum: <15 ug/mL (ref 10–30)

## 2013-09-20 LAB — SALICYLATE LEVEL: Salicylate Lvl: <2 mg/dL — ABNORMAL LOW (ref 2.8–20.0)

## 2013-09-20 LAB — POCT PREGNANCY, URINE: Preg Test, Ur: NEGATIVE

## 2013-09-20 MED ORDER — NICOTINE 21 MG/24HR TD PT24: 21.0000 mg | MEDICATED_PATCH | Freq: Every day | TRANSDERMAL | Status: DC

## 2013-09-20 MED ORDER — CHLORDIAZEPOXIDE HCL 25 MG PO CAPS
25.0000 mg | ORAL_CAPSULE | Freq: Four times a day (QID) | ORAL | Status: AC
  Administered 2013-09-20 (×4): 25 mg via ORAL
  Filled 2013-09-20 (×4): qty 1

## 2013-09-20 MED ORDER — MAGNESIUM HYDROXIDE 400 MG/5ML PO SUSP: 30.0000 mL | Freq: Every day | ORAL | Status: DC | PRN

## 2013-09-20 MED ORDER — ADULT MULTIVITAMIN W/MINERALS CH
1.0000 | ORAL_TABLET | Freq: Every day | ORAL | Status: DC
  Administered 2013-09-20: 1 via ORAL
  Filled 2013-09-20 (×4): qty 1

## 2013-09-20 MED ORDER — ARIPIPRAZOLE 5 MG PO TABS
5.0000 mg | ORAL_TABLET | Freq: Every day | ORAL | Status: DC
  Filled 2013-09-20 (×3): qty 1

## 2013-09-20 MED ORDER — ZOLPIDEM TARTRATE 5 MG PO TABS: 5.0000 mg | ORAL_TABLET | Freq: Every evening | ORAL | Status: DC | PRN

## 2013-09-20 MED ORDER — CHLORDIAZEPOXIDE HCL 25 MG PO CAPS
25.0000 mg | ORAL_CAPSULE | ORAL | Status: AC
  Administered 2013-09-22 (×2): 25 mg via ORAL
  Filled 2013-09-20 (×2): qty 1

## 2013-09-20 MED ORDER — HYDROXYZINE HCL 25 MG PO TABS
25.0000 mg | ORAL_TABLET | Freq: Four times a day (QID) | ORAL | Status: AC | PRN
  Administered 2013-09-20: 25 mg via ORAL
  Filled 2013-09-20: qty 1

## 2013-09-20 MED ORDER — TRAZODONE HCL 50 MG PO TABS
50.0000 mg | ORAL_TABLET | Freq: Every evening | ORAL | Status: DC | PRN
  Administered 2013-09-20 (×2): 50 mg via ORAL
  Filled 2013-09-20 (×7): qty 1

## 2013-09-20 MED ORDER — ALUM & MAG HYDROXIDE-SIMETH 200-200-20 MG/5ML PO SUSP: 30.0000 mL | ORAL | Status: DC | PRN

## 2013-09-20 MED ORDER — FLUOXETINE HCL 20 MG PO CAPS
20.0000 mg | ORAL_CAPSULE | Freq: Every day | ORAL | Status: DC
  Administered 2013-09-20 – 2013-09-23 (×4): 20 mg via ORAL
  Filled 2013-09-20 (×5): qty 1

## 2013-09-20 MED ORDER — ALBUTEROL SULFATE HFA 108 (90 BASE) MCG/ACT IN AERS
2.0000 | INHALATION_SPRAY | Freq: Four times a day (QID) | RESPIRATORY_TRACT | Status: DC | PRN
  Filled 2013-09-20: qty 6.7

## 2013-09-20 MED ORDER — ONDANSETRON HCL 4 MG PO TABS: 4.0000 mg | ORAL_TABLET | Freq: Three times a day (TID) | ORAL | Status: DC | PRN

## 2013-09-20 MED ORDER — METHOCARBAMOL 500 MG PO TABS
ORAL_TABLET | ORAL | Status: AC
  Filled 2013-09-20: qty 1

## 2013-09-20 MED ORDER — FLUOXETINE HCL 20 MG PO CAPS
ORAL_CAPSULE | ORAL | Status: AC
  Administered 2013-09-20: 20 mg via ORAL
  Filled 2013-09-20: qty 1

## 2013-09-20 MED ORDER — LOPERAMIDE HCL 2 MG PO CAPS
2.0000 mg | ORAL_CAPSULE | ORAL | Status: AC | PRN
  Administered 2013-09-21: 4 mg via ORAL
  Filled 2013-09-20: qty 1

## 2013-09-20 MED ORDER — ONDANSETRON 4 MG PO TBDP: 4.0000 mg | ORAL_TABLET | Freq: Four times a day (QID) | ORAL | Status: AC | PRN

## 2013-09-20 MED ORDER — VITAMIN B-1 100 MG PO TABS
100.0000 mg | ORAL_TABLET | Freq: Every day | ORAL | Status: DC
  Administered 2013-09-21 – 2013-09-23 (×3): 100 mg via ORAL
  Filled 2013-09-20 (×4): qty 1

## 2013-09-20 MED ORDER — CHLORDIAZEPOXIDE HCL 25 MG PO CAPS
25.0000 mg | ORAL_CAPSULE | Freq: Every day | ORAL | Status: AC
  Administered 2013-09-23: 25 mg via ORAL
  Filled 2013-09-20: qty 1

## 2013-09-20 MED ORDER — THIAMINE HCL 100 MG/ML IJ SOLN
100.0000 mg | Freq: Once | INTRAMUSCULAR | Status: AC
  Administered 2013-09-20: 100 mg via INTRAMUSCULAR
  Filled 2013-09-20: qty 2

## 2013-09-20 MED ORDER — LORAZEPAM 1 MG PO TABS
1.0000 mg | ORAL_TABLET | Freq: Three times a day (TID) | ORAL | Status: DC | PRN
  Administered 2013-09-20: 1 mg via ORAL
  Filled 2013-09-20: qty 1

## 2013-09-20 MED ORDER — IBUPROFEN 200 MG PO TABS
400.0000 mg | ORAL_TABLET | Freq: Four times a day (QID) | ORAL | Status: DC | PRN
  Administered 2013-09-20: 400 mg via ORAL
  Filled 2013-09-20: qty 2

## 2013-09-20 MED ORDER — ACETAMINOPHEN 325 MG PO TABS
650.0000 mg | ORAL_TABLET | Freq: Four times a day (QID) | ORAL | Status: DC | PRN
  Filled 2013-09-20: qty 2

## 2013-09-20 MED ORDER — CHLORDIAZEPOXIDE HCL 25 MG PO CAPS
25.0000 mg | ORAL_CAPSULE | Freq: Three times a day (TID) | ORAL | Status: AC
  Administered 2013-09-21 (×3): 25 mg via ORAL
  Filled 2013-09-20 (×3): qty 1

## 2013-09-20 MED ORDER — METHOCARBAMOL 500 MG PO TABS
500.0000 mg | ORAL_TABLET | Freq: Three times a day (TID) | ORAL | Status: AC
  Administered 2013-09-20 – 2013-09-21 (×5): 500 mg via ORAL
  Filled 2013-09-20 (×5): qty 1

## 2013-09-20 MED ORDER — CHLORDIAZEPOXIDE HCL 25 MG PO CAPS: 25.0000 mg | ORAL_CAPSULE | Freq: Four times a day (QID) | ORAL | Status: AC | PRN

## 2013-09-20 MED ORDER — ACETAMINOPHEN 325 MG PO TABS: 650.0000 mg | ORAL_TABLET | ORAL | Status: DC | PRN
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
## 2013-09-20 NOTE — ED Provider Notes (Signed)
Medical screening examination/treatment/procedure(s) were performed by non-physician practitioner and as supervising physician I was immediately available for consultation/collaboration.  EKG Interpretation   None        Sunnie Nielsen, MD 09/20/13 2259

## 2013-09-20 NOTE — Progress Notes (Signed)
Adult Psychoeducational Group Note  Date:  09/20/2013 Time:  1:19 PM  Group Topic/Focus:  Recovery Goals:   The focus of this group is to identify appropriate goals for recovery and establish a plan to achieve them.  Participation Level:  Did Not Attend  Additional Comments:  Pt was encouraged to attend group but pt stayed in bed.  Cathlean Cower 09/20/2013, 1:19 PM
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
## 2013-09-20 NOTE — Tx Team (Signed)
Initial Interdisciplinary Treatment Plan  PATIENT STRENGTHS: (choose at least two) Capable of independent living General fund of knowledge Motivation for treatment/growth  PATIENT STRESSORS: Financial difficulties Marital or family conflict Medication change or noncompliance Substance abuse   PROBLEM LIST: Problem List/Patient Goals Date to be addressed Date deferred Reason deferred Estimated date of resolution  Chemical Dependency      Depression                                                 DISCHARGE CRITERIA:  Ability to meet basic life and health needs Improved stabilization in mood, thinking, and/or behavior Need for constant or close observation no longer present Withdrawal symptoms are absent or subacute and managed without 24-hour nursing intervention  PRELIMINARY DISCHARGE PLAN: Attend 12-step recovery group Placement in alternative living arrangements  PATIENT/FAMIILY INVOLVEMENT: This treatment plan has been presented to and reviewed with the patient, Kristina Huffman, and/or family member, .  The patient and family have been given the opportunity to ask questions and make suggestions.  [name] 09/20/2013, 5:30 AM
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
## 2013-09-20 NOTE — Progress Notes (Signed)
Writer consulted with the PA, Donell Sievert regarding the patient meeting criteria for inpatient hospitalization.  Per the [facility], Elvina Mattes) the patient has been accepted to [facility] Bed 301-2.  Dr. Dub Mikes is the accepting doctor.     Writer informed the ER MD (Dr. Dierdre Highman ) and the nurse working with the patient.  Writer faxed the support paperwork to [facility].
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
## 2013-09-20 NOTE — [facility] Suicide Risk Assessment (Signed)
Suicide Risk Assessment  Admission Assessment     Nursing information obtained from:    Demographic factors:    Current Mental Status:    Loss Factors:    Historical Factors:    Risk Reduction Factors:     CLINICAL FACTORS:   Bipolar Disorder:   Depressive phase Alcohol/Substance Abuse/Dependencies  COGNITIVE FEATURES THAT CONTRIBUTE TO RISK:  Closed-mindedness Polarized thinking Thought constriction (tunnel vision)    SUICIDE RISK:   Moderate:  Frequent suicidal ideation with limited intensity, and duration, some specificity in terms of plans, no associated intent, good self-control, limited dysphoria/symptomatology, some risk factors present, and identifiable protective factors, including available and accessible social support.  PLAN OF CARE: Supportive approach/coping skills/relapse prevention                               Librium detox protocol                               D/C the Effexor and the Lithium (as per patients request)                               Re start Prozac 20 mg add Abilify 2 mg daily  I certify that inpatient services furnished can reasonably be expected to improve the patient's condition.  Tyrease Vandeberg A 09/20/2013, 4:55 PM

## 2013-09-20 NOTE — [facility] Group Notes (Signed)
[facility] LCSW Group Therapy  09/20/2013 1:36 PM  Type of Therapy:  Group Therapy  Participation Level:  Did Not Attend Pt stated that she was not feeling well (severe withdrawals)/in room  Smart, HeatherLCSWA 09/20/2013, 1:36 PM

## 2013-09-20 NOTE — Progress Notes (Signed)
The focus of this group is to educate the patient on the purpose and policies of crisis stabilization and provide a format to answer questions about their admission.  The group details unit policies and expectations of patients while admitted.  Patient did not attend group. 

## 2013-09-20 NOTE — [facility] Group Notes (Signed)
Pt attended group and was attentive     She had minimal participation in the discussion but her behavior was appropriate
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
## 2013-09-21 DIAGNOSIS — F1994 Other psychoactive substance use, unspecified with psychoactive substance-induced mood disorder: Secondary | ICD-10-CM

## 2013-09-21 DIAGNOSIS — F4310 Post-traumatic stress disorder, unspecified: Secondary | ICD-10-CM

## 2013-09-21 DIAGNOSIS — F4321 Adjustment disorder with depressed mood: Secondary | ICD-10-CM

## 2013-09-21 MED ORDER — CARBAMAZEPINE ER 200 MG PO TB12
200.0000 mg | ORAL_TABLET | Freq: Two times a day (BID) | ORAL | Status: DC
  Administered 2013-09-21 – 2013-09-23 (×4): 200 mg via ORAL
  Filled 2013-09-21 (×7): qty 1

## 2013-09-21 MED ORDER — PRENATAL MULTIVITAMIN CH
1.0000 | ORAL_TABLET | Freq: Every day | ORAL | Status: DC
  Administered 2013-09-21 – 2013-09-23 (×3): 1 via ORAL
  Filled 2013-09-21 (×4): qty 1

## 2013-09-21 MED ORDER — NYSTATIN 100000 UNIT/GM EX OINT
TOPICAL_OINTMENT | Freq: Two times a day (BID) | CUTANEOUS | Status: DC
  Administered 2013-09-21 – 2013-09-23 (×4): via TOPICAL
  Filled 2013-09-21 (×2): qty 15

## 2013-09-21 MED ORDER — RISPERIDONE 1 MG PO TABS
1.0000 mg | ORAL_TABLET | Freq: Two times a day (BID) | ORAL | Status: DC | PRN
  Administered 2013-09-21 – 2013-09-22 (×3): 1 mg via ORAL
  Filled 2013-09-21 (×3): qty 1
  Filled 2013-09-21: qty 28

## 2013-09-21 MED ORDER — ZOLPIDEM TARTRATE 10 MG PO TABS
10.0000 mg | ORAL_TABLET | Freq: Every evening | ORAL | Status: DC | PRN
  Administered 2013-09-21 – 2013-09-22 (×2): 10 mg via ORAL
  Filled 2013-09-21 (×2): qty 1

## 2013-09-21 MED ORDER — RISPERIDONE 1 MG PO TABS
1.0000 mg | ORAL_TABLET | Freq: Every day | ORAL | Status: DC
  Filled 2013-09-21: qty 1

## 2013-09-21 NOTE — Progress Notes (Signed)
[facility] MD Progress Note  09/21/2013 4:06 PM Kristina Huffman  MRN:  [id number]  Subjective:  Kristina Huffman reports, "I'm not doing well. I feel angry, agitated, highly anxious and depressed. I feel like Abilify is not working for me. I could not sleep last night. Trazodone does not help me. It makes me have shortness of breath. I have social anxiety disorder. Being around people is not the best thing for me. I lost my 50 year old Kristina Huffman. He overdosed. I'm not able to cope. I blame my husband for contributing to his death. That is why I was thinking about killing him. I need help. I'm not well. I feel very depressed".  Diagnosis:   DSM5: Schizophrenia Disorders:  NA Obsessive-Compulsive Disorders:  NA Trauma-Stressor Disorders:  Posttraumatic Stress Disorder (309.81) Substance/Addictive Disorders:  Alcohol Related Disorder - Severe (303.90), Benzodiazepine dependence Depressive Disorders:  Bipolar affective disorder  Axis I: Alcohol Related Disorder - Severe (303.90), Benzodiazepine dependence, Bipolar affective disorder, PTSD Axis II: Deferred Axis III:  Past Medical History  Diagnosis Date  . Depression   . Asthma   . Hepatitis C   . Heroin abuse   . ETOH abuse   . Anxiety   . History of MRSA infection     legs and spread to face   Axis IV: other psychosocial or environmental problems and Alcoholism, chronic, Bereavement Axis V: 41-50 serious symptoms  ADL's:  Fairly intact  Sleep: Poor  Appetite:  Fair  Suicidal Ideation:  Plan:  Denies Intent:  Denies Means:  Denies  Homicidal Ideation:  Plan:  Denies Intent:  Denies Means:  Denies  AEB (as evidenced by): Per patient's reports  Psychiatric Specialty Exam: Review of Systems  Constitutional: Negative.   HENT: Negative.   Eyes: Negative.   Respiratory: Negative.   Cardiovascular: Negative.   Gastrointestinal: Negative.   Genitourinary: Negative.   Musculoskeletal: Negative.   Neurological: Negative.    Endo/Heme/Allergies: Negative.   Psychiatric/Behavioral: Positive for depression, suicidal ideas and substance abuse. Negative for hallucinations and memory loss. The patient is nervous/anxious and has insomnia.     Blood pressure 120/79, pulse 72, temperature 97.8 F (36.6 C), temperature source Oral, resp. rate 16, height 5\' 6"  (1.676 m), weight 95.255 kg (210 lb), last menstrual period 08/02/2013.Body mass index is 33.91 kg/(m^2).  General Appearance: Fairly Groomed  Patent attorney::  Poor  Speech:  Clear and Coherent  Volume:  Increased  Mood:  Angry, Anxious, Depressed, Dysphoric, Hopeless, Irritable and Worthless  Affect:  Tearful  Thought Process:  Coherent and Intact  Orientation:  Full (Time, Place, and Person)  Thought Content:  Rumination  Suicidal Thoughts:  No  Homicidal Thoughts:  No  Memory:  Immediate;   Good Recent;   Good Remote;   Good  Judgement:  Impaired  Insight:  Fair  Psychomotor Activity:  Restlessness  Concentration:  Poor  Recall:  Good  Akathisia:  No  Handed:  Right  AIMS (if indicated):     Assets:  Desire for Improvement  Sleep:  Number of Hours: 4.75   Current Medications: Current Facility-Administered Medications  Medication Dose Route Frequency Provider Last Rate Last Dose  . acetaminophen (TYLENOL) tablet 650 mg  650 mg Oral Q6H PRN Kerry Hough, [name]      . albuterol (PROVENTIL HFA;VENTOLIN HFA) 108 (90 BASE) MCG/ACT inhaler 2 puff  2 puff Inhalation Q6H PRN Kerry Hough, [name]      . alum & mag hydroxide-simeth (MAALOX/MYLANTA) 200-200-20 MG/5ML suspension 30  mL  30 mL Oral Q4H PRN Kerry Hough, [name]      . ARIPiprazole (ABILIFY) tablet 5 mg  5 mg Oral Q2000 Sanjuana Kava, NP      . carbamazepine (TEGRETOL XR) 12 hr tablet 200 mg  200 mg Oral BID Sanjuana Kava, NP      . chlordiazePOXIDE (LIBRIUM) capsule 25 mg  25 mg Oral Q6H PRN Kerry Hough, [name]      . chlordiazePOXIDE (LIBRIUM) capsule 25 mg  25 mg Oral TID Kerry Hough,  [name]   25 mg at 09/21/13 1117   Followed by  . [START ON 09/22/2013] chlordiazePOXIDE (LIBRIUM) capsule 25 mg  25 mg Oral BH-qamhs Spencer E Simon, [name]       Followed by  . [START ON 09/23/2013] chlordiazePOXIDE (LIBRIUM) capsule 25 mg  25 mg Oral Daily Kerry Hough, [name]      . FLUoxetine (PROZAC) capsule 20 mg  20 mg Oral Daily Sanjuana Kava, NP   20 mg at 09/21/13 0853  . hydrOXYzine (ATARAX/VISTARIL) tablet 25 mg  25 mg Oral Q6H PRN Kerry Hough, [name]   25 mg at 09/20/13 1436  . ibuprofen (ADVIL,MOTRIN) tablet 400 mg  400 mg Oral Q6H PRN Rachael Fee, MD   400 mg at 09/20/13 1328  . loperamide (IMODIUM) capsule 2-4 mg  2-4 mg Oral PRN Kerry Hough, [name]   4 mg at 09/21/13 1117  . magnesium hydroxide (MILK OF MAGNESIA) suspension 30 mL  30 mL Oral Daily PRN Kerry Hough, [name]      . methocarbamol (ROBAXIN) tablet 500 mg  500 mg Oral TID Sanjuana Kava, NP   500 mg at 09/21/13 1117  . ondansetron (ZOFRAN-ODT) disintegrating tablet 4 mg  4 mg Oral Q6H PRN Kerry Hough, [name]      . [START ON 09/22/2013] prenatal multivitamin tablet 1 tablet  1 tablet Oral Daily Rachael Fee, MD      . thiamine (VITAMIN B-1) tablet 100 mg  100 mg Oral Daily Kerry Hough, [name]   100 mg at 09/21/13 1610  . traZODone (DESYREL) tablet 50 mg  50 mg Oral QHS,MR X 1 Kerry Hough, [name]   50 mg at 09/20/13 2233    Lab Results:  Results for orders placed during the hospital encounter of 09/20/13 (from the past 48 hour(s))  URINE RAPID DRUG SCREEN (HOSP PERFORMED)     Status: Abnormal   Collection Time    09/20/13  5:53 PM      Result Value Range   Opiates NONE DETECTED  NONE DETECTED   Cocaine POSITIVE (*) NONE DETECTED   Benzodiazepines NONE DETECTED  NONE DETECTED   Amphetamines NONE DETECTED  NONE DETECTED   Tetrahydrocannabinol NONE DETECTED  NONE DETECTED   Barbiturates NONE DETECTED  NONE DETECTED   Comment:            DRUG SCREEN FOR MEDICAL PURPOSES     ONLY.  IF CONFIRMATION IS NEEDED      FOR ANY PURPOSE, NOTIFY LAB     WITHIN 5 DAYS.                LOWEST DETECTABLE LIMITS     FOR URINE DRUG SCREEN     Drug Class       Cutoff (ng/mL)     Amphetamine      1000     Barbiturate      200  Benzodiazepine   200     Tricyclics       300     Opiates          300     Cocaine          300     THC              50     Performed at [facility]    Physical Findings: AIMS: Facial and Oral Movements Muscles of Facial Expression: None, normal Lips and Perioral Area: None, normal Jaw: None, normal Tongue: None, normal,Extremity Movements Upper (arms, wrists, hands, fingers): None, normal Lower (legs, knees, ankles, toes): None, normal, Trunk Movements Neck, shoulders, hips: None, normal, Overall Severity Severity of abnormal movements (highest score from questions above): None, normal Incapacitation due to abnormal movements: None, normal Patient's awareness of abnormal movements (rate only patient's report): No Awareness, Dental Status Current problems with teeth and/or dentures?: No Does patient usually wear dentures?: No  CIWA:  CIWA-Ar Total: 1 COWS:     Treatment Plan Summary: Daily contact with patient to assess and evaluate symptoms and progress in treatment Medication management  Plan: Supportive approach/coping skills/relapse prevention. Initiate Tegretol XR 200 mg bid for mood stabilization. Add Risperdal 1 mg Q hs for mood control. Discontinue Abilify, Encouraged out of room, participation in group sessions and application of coping skills when distressed. Will continue to monitor response to/adverse effects of medications in use to assure effectiveness. Continue to monitor mood, behavior and interaction with staff and other patients. Continue current plan of care.  Medical Decision Making Problem Points:  Review of last therapy session (1) and Review of psycho-social stressors (1) Data Points:  Review of medication regiment & side  effects (2) Review of new medications or change in dosage (2)  I certify that inpatient services furnished can reasonably be expected to improve the patient's condition.   Armandina Stammer I 09/21/2013, 4:06 PM Agree with assessment and plan Madie Reno A. Dub Mikes, M.D.
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
## 2013-09-21 NOTE — Progress Notes (Signed)
Patient ID: Kristina Huffman, female   DOB: 07-25-1961, 52 y.o.   MRN: [id number] Pt attended Pharmacy group.
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
## 2013-09-21 NOTE — [facility] Group Notes (Signed)
[facility] LCSW Aftercare Discharge Planning Group Note   09/21/2013 9:21 AM  Participation Quality:  DID NOT ATTEND-pt in bed asleep/refused to attend group this morning.   Smart, HeatherLCSWA

## 2013-09-21 NOTE — Progress Notes (Signed)
Patient ID: Kristina Huffman, female   DOB: [birth year], 52 y.o.   MRN: [id number] Patients' were encouraged to reflect on 2014 and consider what they want to do differently in 2015. This patient stated she wants to focus on herself.

## 2013-09-21 NOTE — Progress Notes (Signed)
Patient ID: Kristina Huffman, female   DOB: 12-11-1960, 52 y.o.   MRN: [id number] She has been up and to groups today interacting with peers and staff more often.  Self inventory: depression 9, hopelessness 9 w/d,s chilling agitation. Has SI thoughts stated she can not hurt herself and she contracted for safety. She said she has a rash under her breasts and medication to be ordered.  She was in group this AM and when group was over she said that she felt very agitated and could just go off. Spoke to a length and gave her her 12 noon medication and she has been calm.
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
## 2013-09-22 NOTE — Progress Notes (Signed)
Adult Psychoeducational Group Note  Date:  09/22/2013 Time:  9:15AM Group Topic/Focus:  Nursing Group  Participation Level:  Active  Participation Quality:  Appropriate and Attentive  Affect:  Appropriate  Cognitive:  Alert and Appropriate  Insight: Appropriate  Engagement in Group:  Engaged  Modes of Intervention:  Discussion  Additional Comments:  Pt was attentive and appropriate during today's morning Wellness group with nursing staff.    Theodoro Grist D 09/22/2013, 12:20 PM

## 2013-09-22 NOTE — Progress Notes (Signed)
Patient ID: Kristina Huffman, female   DOB: 1960/12/09, 53 y.o.   MRN: [id number]  Pt asleep: no s/s of distress noted at this time. Respirations regular and unlabored.

## 2013-09-22 NOTE — Progress Notes (Signed)
Pt attended AA group 

## 2013-09-23 MED ORDER — ZOLPIDEM TARTRATE 10 MG PO TABS: 10.0000 mg | ORAL_TABLET | Freq: Every evening | ORAL | Status: DC | PRN

## 2013-09-23 MED ORDER — RISPERIDONE 1 MG PO TABS: 1.0000 mg | ORAL_TABLET | Freq: Two times a day (BID) | ORAL | Status: DC | PRN

## 2013-09-23 MED ORDER — CARBAMAZEPINE ER 200 MG PO TB12: 200.0000 mg | ORAL_TABLET | Freq: Two times a day (BID) | ORAL | Status: DC

## 2013-09-23 MED ORDER — NYSTATIN 100000 UNIT/GM EX OINT
TOPICAL_OINTMENT | Freq: Two times a day (BID) | CUTANEOUS | Status: DC
Start: 2013-09-23 — End: 2016-06-04

## 2013-09-23 MED ORDER — FLUOXETINE HCL 20 MG PO CAPS
20.0000 mg | ORAL_CAPSULE | Freq: Every day | ORAL | Status: DC
Start: 2013-09-23 — End: 2016-06-04

## 2013-09-23 MED ORDER — ALBUTEROL SULFATE HFA 108 (90 BASE) MCG/ACT IN AERS: 2.0000 | INHALATION_SPRAY | Freq: Four times a day (QID) | RESPIRATORY_TRACT | Status: DC | PRN

## 2013-09-23 MED ORDER — ZOLPIDEM TARTRATE 10 MG PO TABS: 10.0000 mg | ORAL_TABLET | Freq: Every evening | ORAL | Status: AC | PRN

## 2013-09-23 MED ORDER — NYSTATIN 100000 UNIT/GM EX OINT: TOPICAL_OINTMENT | Freq: Two times a day (BID) | CUTANEOUS | Status: DC

## 2013-09-23 MED ORDER — NICOTINE 21 MG/24HR TD PT24
21.0000 mg | MEDICATED_PATCH | Freq: Every day | TRANSDERMAL | Status: DC
  Filled 2013-09-23: qty 1

## 2013-09-23 NOTE — Progress Notes (Signed)
Pt observed resting in bed with eyes closed. RR WNL, even and unlabored. Level III obs in place for safety and pt is safe. Jamie Kato
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
## 2013-09-23 NOTE — Progress Notes (Signed)
[facility] Adult Case Management Discharge Plan :  Will you be returning to the same living situation after discharge: No.   At discharge, do you have transportation home?:No.b Patient will need assistance with bus pass and PART Fare Do you have the ability to pay for your medications:No.  Patient needs assistance with indigent medications   Release of information consent forms completed and in the chart;  Patient's signature needed at discharge.  Patient to Follow up at: Follow-up Information   Follow up with [facility]. (Walk in Monday through Friday between 8am-12pm for hospital followup/assessment for therapy services. )    Contact information:   [address], [place] [address] Phone: [phone] Fax: [phone]      Follow up with [facility]. (Referral sent 09/20/13)    Contact information:   [address] [place], [place] [address] Phone: [phone] Fax: [phone]      Patient denies SI/HI:  Patient no longer endorsing SI/HI or other thoughts of self harm.   Safety Planning and Suicide Prevention discussed: Reviewed by patient's CSW.  Concha Pyo 09/23/2013, 12:36 PM

## 2013-09-23 NOTE — [facility] Group Notes (Signed)
[facility] LCSW Group Therapy  09/23/2013  1:15 PM   Type of Therapy:  Group Therapy  Participation Level:  Active  Participation Quality:  Attentive, Sharing and Supportive  Affect:  Anxious  Cognitive:  Alert and Oriented  Insight:  Developing/Improving and Engaged  Engagement in Therapy:  Developing/Improving and Engaged  Modes of Intervention:  Clarification, Confrontation, Discussion, Education, Exploration, Limit-setting, Orientation, Problem-solving, Rapport Building, Art therapist, Support and Socialization  Summary of Progress/Problems: The topic for today was feelings about relapse.  Pt discussed what relapse prevention is to them and identified triggers that they are on the path to relapse.  Pt processed their feeling towards relapse and was able to relate to peers.  Pt discussed coping skills that can be used for relapse prevention.   Pt shared that relapse for her is scary.  Pt sat for the beginning of group before leaving for the remainder of group.    Regan Lemming, LCSW 09/23/2013 3:15 PM

## 2013-09-23 NOTE — Clinical Social Work Note (Signed)
Writer met with patient who advised of potential plan to [facility] today.  She is awaiting approval from MD to discharge.  She shared she will follow up with [facility] for outpatient services.  Patient will need assistance with bus ticket and PARTS fare.

## 2013-09-23 NOTE — Progress Notes (Signed)
Patient ID: Kristina Huffman, female   DOB: [birth year], 53 y.o.   MRN: [id number]  D: Patient reports mood improvement today. Still feels a little shaky but no major withdrawal symptoms. Possible discharge to a women's shelter today if possible. No SI/HI at present. A: Staff will monitor on q 15 minute checks, follow treatment plan, and give meds as ordered. R: Cooperative on the unit. Taking meds as scheduled.
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
## 2013-09-23 NOTE — Progress Notes (Signed)
Adult Psychoeducational Group Note  Date:  09/23/2013 Time:  1:01 PM  Group Topic/Focus:  Early Warning Signs:   The focus of this group is to help patients identify signs or symptoms they exhibit before slipping into an unhealthy state or crisis.  Participation Level:  Active  Participation Quality:  Appropriate and Attentive  Affect:  Appropriate  Cognitive:  Appropriate  Insight: Good  Engagement in Group:  Engaged  Modes of Intervention:  Activity, Discussion, Education, Exploration, Socialization and Support  Additional Comments:  Pt came to group and shared that isolation and resentment are two of her early warning signs for relapse. Pt plans on using walking and writing a letter to someone who upset her as coping skills for relapse.   Rogene Houston 09/23/2013, 1:01 PM

## 2013-09-23 NOTE — Tx Team (Signed)
Interdisciplinary Treatment Plan Update   Date Reviewed:  09/23/2013  Time Reviewed:  12:33 PM  Progress in Treatment:   Attending groups: Yes Participating in groups: Yes Taking medication as prescribed: Yes  Tolerating medication: Yes Family/Significant other contact made: Yes  Patient understands diagnosis: Yes  Discussing patient identified problems/goals with staff: Yes Medical problems stabilized or resolved: Yes Denies suicidal/homicidal ideation: Yes Patient has not harmed self or others: Yes  For review of initial/current patient goals, please see plan of care.  Estimated Length of Stay:  Discharge today  Reasons for Continued Hospitalization:   New Problems/Goals identified:    Discharge Plan or Barriers:   Home [facility]  Additional Comments: N/A  Attendees:  Patient:  09/23/2013 12:33 PM   Signature Carlton Adam, MD 09/23/2013 12:33 PM  Signature:  Agustina Caroli, NP 09/23/2013 12:33 PM  Signature:  Jordan [name], RN 09/23/2013 12:33 PM  Signature:  Regan Lemming, LCSW 09/23/2013 12:33 PM  Signature: Caleen Essex, LCSW  09/23/2013 12:33 PM   09/23/2013 12:33 PM   09/23/2013 12:33 PM   09/23/2013 12:33 PM   09/23/2013 12:33 PM   09/23/2013  12:33 PM   09/23/2013  12:33 PM   09/23/2013  12:33 PM    Scribe for Treatment Team:   Joette Catching,  09/23/2013 12:33 PM
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
## 2013-09-28 NOTE — Progress Notes (Addendum)
Patient Discharge Instructions:  After Visit Summary (AVS):   Faxed to:  09/28/13 Discharge Summary Note:   Faxed to:  09/28/13 Psychiatric Admission Assessment Note:   Faxed to:  09/28/13 Suicide Risk Assessment - Discharge Assessment:   Faxed to:  09/28/13 Faxed/Sent to the [facility] Care provider:  09/28/13 Faxed to [place] @ [phone] No documentation was faxed to [facility] for HBIPS.  Per the SW the patient did not go.  No beds were available.   Patsey Berthold, 09/28/2013, 2:04 PM

## 2016-02-01 ENCOUNTER — Ambulatory Visit: Payer: Self-pay | Admitting: Family Medicine

## 2016-03-18 ENCOUNTER — Telehealth (HOSPITAL_COMMUNITY): Payer: Self-pay | Admitting: *Deleted

## 2016-03-18 NOTE — Telephone Encounter (Signed)
Telephoned patient at home # and left message to return call to [facility] 

## 2016-03-31 ENCOUNTER — Other Ambulatory Visit: Payer: Self-pay | Admitting: Obstetrics and Gynecology

## 2016-03-31 DIAGNOSIS — Z1231 Encounter for screening mammogram for malignant neoplasm of breast: Secondary | ICD-10-CM

## 2016-04-24 ENCOUNTER — Ambulatory Visit (HOSPITAL_COMMUNITY): Payer: Self-pay

## 2016-04-29 ENCOUNTER — Telehealth (HOSPITAL_COMMUNITY): Payer: Self-pay | Admitting: *Deleted

## 2016-04-29 NOTE — Telephone Encounter (Signed)
Telephoned patient at home # and left message to return call to [facility]. Filed chart under no show appointments.

## 2016-05-07 ENCOUNTER — Other Ambulatory Visit: Payer: Self-pay

## 2016-05-07 DIAGNOSIS — B182 Chronic viral hepatitis C: Secondary | ICD-10-CM

## 2016-05-08 LAB — HEPATITIS A ANTIBODY, TOTAL: Hep A Total Ab: NONREACTIVE

## 2016-05-08 LAB — PROTIME-INR
INR: 1
Prothrombin Time: 10.4 s (ref 9.0–11.5)

## 2016-05-08 LAB — HEPATITIS B CORE ANTIBODY, TOTAL: Hep B Core Total Ab: NONREACTIVE

## 2016-05-08 LAB — HEPATITIS B SURFACE ANTIGEN: Hepatitis B Surface Ag: NEGATIVE

## 2016-05-08 LAB — HIV ANTIBODY (ROUTINE TESTING W REFLEX): HIV 1&2 Ab, 4th Generation: NONREACTIVE

## 2016-05-08 LAB — AFP TUMOR MARKER: AFP-Tumor Marker: 7.1 ng/mL — ABNORMAL HIGH (ref <6.1)

## 2016-05-08 LAB — HEPATITIS B SURFACE ANTIBODY,QUALITATIVE: Hep B S Ab: NEGATIVE

## 2016-05-11 LAB — HCV RNA, QUANT REAL-TIME PCR W/REFLEX
HCV RNA, PCR, QN (Log): 6.27 LogIU/mL — ABNORMAL HIGH
HCV RNA, PCR, QN: 1880000 IU/mL — ABNORMAL HIGH

## 2016-05-11 LAB — HCV RNA,LIPA RFLX NS5A DRUG RESIST

## 2016-05-14 LAB — HCV RNA NS5A DRUG RESISTANCE

## 2016-05-21 ENCOUNTER — Telehealth (HOSPITAL_COMMUNITY): Payer: Self-pay | Admitting: *Deleted

## 2016-05-21 NOTE — Telephone Encounter (Signed)
Telephoned patient at home number and left message about [facility] appointment for Thursday August 31 2:30

## 2016-05-22 ENCOUNTER — Ambulatory Visit
Admission: RE | Admit: 2016-05-22 | Discharge: 2016-05-22 | Disposition: A | Discharge status: Home / Self Care | Payer: No Typology Code available for payment source | Source: Ambulatory Visit | Attending: Obstetrics and Gynecology | Admitting: Obstetrics and Gynecology

## 2016-05-22 ENCOUNTER — Ambulatory Visit (HOSPITAL_COMMUNITY)
Admission: RE | Admit: 2016-05-22 | Discharge: 2016-05-22 | Disposition: A | Discharge status: Home / Self Care | Payer: Self-pay | Source: Ambulatory Visit | Attending: Obstetrics and Gynecology | Admitting: Obstetrics and Gynecology

## 2016-05-22 ENCOUNTER — Encounter (HOSPITAL_COMMUNITY): Payer: Self-pay

## 2016-05-22 ENCOUNTER — Ambulatory Visit (HOSPITAL_COMMUNITY): Payer: Self-pay

## 2016-05-22 VITALS — BP 116/70 | Temp 98.9°F | Ht 66.0 in | Wt 258.6 lb

## 2016-05-22 DIAGNOSIS — Z1231 Encounter for screening mammogram for malignant neoplasm of breast: Secondary | ICD-10-CM

## 2016-05-22 DIAGNOSIS — Z01419 Encounter for gynecological examination (general) (routine) without abnormal findings: Secondary | ICD-10-CM

## 2016-05-22 NOTE — Patient Instructions (Signed)
Explained breast self awareness with Kristina Huffman. Let her know [facility] will cover Pap smears and HPV typing every 5 years unless has a history of abnormal Pap smears. Referred patient to the [facility] for a screening mammogram. Appointment scheduled for Thursday, May 22, 2016 at 1500. Let patient know the [facility] will follow up with her within the next couple weeks with results of mammogram by letter or phone. Kristina Huffman verbalized understanding.  Kristina Huffman, Arvil Chaco, RN 9:35 PM

## 2016-05-22 NOTE — Progress Notes (Signed)
No complaints today.   Pap Smear: Pap smear completed today. Last Pap smear was in 2008 and normal per patient. Per patient has no history of an abnormal Pap smear. No Pap smear results are in EPIC.  Physical exam: Breasts Breasts symmetrical. No skin abnormalities bilateral breasts. No nipple retraction bilateral breasts. No nipple discharge bilateral breasts. No lymphadenopathy. No lumps palpated bilateral breasts. Patient complained of slight tenderness within the right breast on exam. Referred patient to the [facility] for a screening mammogram. Appointment scheduled for Thursday, May 22, 2016 at 1500.  Pelvic/Bimanual   Ext Genitalia No lesions, no swelling and no discharge observed on external genitalia.         Vagina Vagina pink and normal texture. No lesions or discharge observed in vagina.          Cervix Cervix is present. Cervix pink and of normal texture. No discharge observed.     Uterus Uterus is present and palpable. Uterus in normal position and normal size.        Adnexae Bilateral ovaries present and palpable. No tenderness on palpation.          Rectovaginal No rectal exam completed today since patient had no rectal complaints. No skin abnormalities observed on exam.    Smoking History: Patient has never smoked.  Patient Navigation: Patient education provided. Access to services provided for patient through [place] [facility] program.   Colorectal Cancer Screening: Per patient had a colonoscopy completed 20 years ago. Informed patient that she is due for a colonoscopy since the recommendation is every 10 years starting at age 39. No complaints today.

## 2016-05-22 NOTE — Progress Notes (Signed)
p 

## 2016-05-23 ENCOUNTER — Encounter (HOSPITAL_COMMUNITY): Payer: Self-pay | Admitting: *Deleted

## 2016-05-23 LAB — CYTOLOGY - PAP

## 2016-06-03 ENCOUNTER — Telehealth (HOSPITAL_COMMUNITY): Payer: Self-pay | Admitting: *Deleted

## 2016-06-03 NOTE — Telephone Encounter (Signed)
Telephoned patient at home number and discussed negative pap smear results. HPV was negative. Next pap smear due in five years. Patient voiced understanding.

## 2016-06-04 ENCOUNTER — Encounter: Payer: Self-pay | Admitting: Internal Medicine

## 2016-06-04 ENCOUNTER — Ambulatory Visit (INDEPENDENT_AMBULATORY_CARE_PROVIDER_SITE_OTHER): Payer: Self-pay | Admitting: Internal Medicine

## 2016-06-04 DIAGNOSIS — B182 Chronic viral hepatitis C: Secondary | ICD-10-CM

## 2016-06-04 MED ORDER — ELBASVIR-GRAZOPREVIR 50-100 MG PO TABS: 1.0000 | ORAL_TABLET | Freq: Every day | ORAL | 2 refills | Status: DC

## 2016-06-04 NOTE — Patient Instructions (Signed)
Date 06/04/16  Dear Ms Cassell Clement, As discussed in the [facility], your hepatitis C therapy will include the following medications:          Zepatier (elbasvir 50 mg/grazoprevir 100 mg) for 12 weeks   Please note that ALL MEDICATIONS WILL START ON THE SAME DATE for a total of 12 weeks. ---------------------------------------------------------------- Your HCV Treatment Start Date: TBA   Your HCV genotype:  1a    Liver Fibrosis: TBD    ---------------------------------------------------------------- YOUR PHARMACY CONTACT:   [facility] Phone: [phone] Hours: Monday to Friday 7:30 am to 6:00 pm   Please always contact your pharmacy at least 3-4 business days before you run out of medications to ensure your next month's medication is ready or 1 week prior to running out if you receive it by mail.  Remember, each prescription is for 28 days. ---------------------------------------------------------------- GENERAL NOTES REGARDING YOUR HEPATITIS C MEDICATION:  ZEPATIER is available as a beige-colored, oval-shaped, film-coated tablet debossed with "770" on one side and plain on the other. Each tablet contains 50 mg elbasvir and 100 mg grazoprevir.   Common side effects of ZEPATIER when used without ribavirin include: - feeling tired -trouble sleeping - headache -diarrhea - nausea  Common side effects of ZEPATIER when used with ribavirin include: - low red blood cell counts (anemia) - feeling irritable - headache - stomach pain - feeling tired - depression - shortness of breath - joint pain - rash or itching   Please note that this only lists the most common side effects and is NOT a comprehensive list of the potential side effects of these medications. For more information, please review the drug information sheets that come with your medication package from the pharmacy.   ---------------------------------------------------------------- GENERAL HELPFUL HINTS ON HCV THERAPY: 1. Stay well-hydrated. 2. Notify the [facility] of any changes in your other over-the-counter/herbal or prescription medications. 3. If you miss a dose of your medication, take the missed dose as soon as you remember. Return to your regular time/dose schedule the next day.  4.  Do not stop taking your medications without first talking with your healthcare provider. 5.  You may take Tylenol (acetaminophen), as long as the dose is less than 2000 mg (OR no more than 4 tablets of the Tylenol Extra Strengths 500mg  tablet) in 24 hours. 6.  You will see our pharmacist-specialist within the first 2 weeks of starting your medication. 7.  You will need to obtain routine labs around week 4 and12 weeks after starting and then 3 to 6 months after finishing Zepatier.   8.  If ribavirin is part of your regimen, you also will have a lab visit within 2 weeks.   Scharlene Gloss, [place] for [place], [place]  [phone]

## 2016-06-04 NOTE — Progress Notes (Signed)
[facility]   CC: consideration for treatment for chronic hepatitis C  HPI:  +Kristina Huffman is a 55 y.o. female who presents for initial evaluation and management of chronic hepatitis C.  Patient tested positive earlier this year. Hepatitis C-associated risk factors present are: IV drug abuse (details: used once with her husband). Patient denies intranasal drug use, multiple sexual partners, renal dialysis, tattoos. Patient has had other studies performed. Results: hepatitis C RNA by PCR, result: positive. Patient has not had prior treatment for Hepatitis C. Patient does not have a past history of liver disease. Patient does not have a family history of liver disease. Patient does not  have associated signs or symptoms related to liver disease.  Labs reviewed and confirm chronic hepatitis C with a positive viral load.   Records reviewed from PCP, in family services and labs done in May.      Patient does not have documented immunity to Hepatitis A. Patient does not have documented immunity to Hepatitis B.    Review of Systems:  Constitutional: negative for fatigue and malaise Gastrointestinal: negative for diarrhea Musculoskeletal: negative for myalgias and arthralgias All other systems reviewed and are negative      Past Medical History:  Diagnosis Date  . Anxiety   . Asthma   . Depression   . ETOH abuse   . Hepatitis C   . Heroin abuse   . History of MRSA infection    legs and spread to face    Prior to Admission medications   Medication Sig Start Date End Date Taking? Authorizing Provider  albuterol (PROVENTIL HFA;VENTOLIN HFA) 108 (90 BASE) MCG/ACT inhaler Inhale 2 puffs into the lungs every 6 (six) hours as needed for wheezing. 09/23/13  Yes Encarnacion Slates, NP  escitalopram (LEXAPRO) 10 MG tablet Take 10 mg by mouth daily.   Yes Historical Provider, MD  gabapentin (NEURONTIN) 100 MG capsule Take 100 mg by mouth 3 (three) times daily.   Yes  Historical Provider, MD  hydrochlorothiazide (HYDRODIURIL) 25 MG tablet Take 25 mg by mouth daily.   Yes Historical Provider, MD  QUEtiapine (SEROQUEL) 100 MG tablet Take 100 mg by mouth 4 (four) times daily.   Yes Historical Provider, MD  Elbasvir-Grazoprevir (ZEPATIER) 50-100 MG TABS Take 1 tablet by mouth daily. 06/04/16   Thayer Headings, MD    Allergies  Allergen Reactions  . Sulfa Antibiotics Shortness Of Breath and Swelling    Tight in throat  . Aspirin Other (See Comments)    "Ringing in ears"    Social History  Substance Use Topics  . Smoking status: Never Smoker  . Smokeless tobacco: Never Used  . Alcohol use No     Comment: clean for 1 year and 2 months    Family History  Problem Relation Age of Onset  . Depression Mother   . Osteoporosis Mother   . COPD Mother   . Diabetes Father   . Hypertension Father   . Congestive Heart Failure Father   . Aneurysm Father       Objective:  Constitutional: in no apparent distress and alert,  Vitals:   06/04/16 1405  BP: (!) 142/88  Pulse: (!) 101  Temp: 98.1 F (36.7 C)   Eyes: anicteric Cardiovascular: Cor RRR Respiratory: CTA B; normal respiratory effort Gastrointestinal: Bowel sounds are normal, liver is not enlarged, spleen is not enlarged Musculoskeletal: no pedal edema noted Skin: negatives: no rash; no porphyria cutanea tarda Lymphatic:  no cervical lymphadenopathy   Laboratory Genotype: No results found for: HCVGENOTYPE HCV viral load: No results found for: HCVQUANT Lab Results  Component Value Date   WBC 5.8 09/20/2013   HGB 15.6 (H) 09/20/2013   HCT 45.8 09/20/2013   MCV 89.3 09/20/2013   PLT 320 09/20/2013    Lab Results  Component Value Date   CREATININE 0.91 09/20/2013   BUN 14 09/20/2013   NA 142 09/20/2013   K 4.2 09/20/2013   CL 104 09/20/2013   CO2 22 09/20/2013    Lab Results  Component Value Date   ALT 99 (H) 09/20/2013   AST 72 (H) 09/20/2013   ALKPHOS 114 09/20/2013      Labs and history reviewed and show CHILD-PUGH A  5-6 points: Child class A 7-9 points: Child class B 10-15 points: Child class C  Lab Results  Component Value Date   INR 1.0 05/07/2016   BILITOT 0.4 09/20/2013   ALBUMIN 4.2 09/20/2013     Assessment: New Patient with Chronic Hepatitis C genotype 1a, untreated.  I discussed with the patient the lab findings that confirm chronic hepatitis C as well as the natural history and progression of disease including about 30% of people who develop cirrhosis of the liver if left untreated and once cirrhosis is established there is a 2-7% risk per year of liver cancer and liver failure.  I discussed the importance of treatment and benefits in reducing the risk, even if significant liver fibrosis exists.   Plan: 1) Patient counseled extensively on limiting acetaminophen to no more than 2 grams daily, avoidance of alcohol. 2) Transmission discussed with patient including sexual transmission, sharing razors and toothbrush.   3) Will need referral to gastroenterology if concern for cirrhosis 4) Will need referral for substance abuse counseling: No.; Further work up to include urine drug screen  No. 5) Will prescribe Zepatier for 12 weeks or 16 weeks with ribavirin if any NS5A resistance found 6) Hepatitis A vaccine Yes.   7) Hepatitis B vaccine Yes.  will do when she establishes [facility] assistance 8) Pneumovax vaccine if concern for cirrhosis 9) Further work up to include liver staging with elastography -will do when she establishes with [facility] assistance 10) NS5A test  Yes.   10) will follow up after starting medication  I will limit lab work now until she is covered with the assistance program and ok to start medication and plan to just check SVR24 and no other labs prior unless she does get 100% coverage through [facility] in the meantime.

## 2016-06-09 ENCOUNTER — Encounter: Payer: Self-pay | Admitting: Pharmacy Technician

## 2016-06-30 ENCOUNTER — Ambulatory Visit: Payer: Self-pay | Admitting: Pharmacist

## 2016-06-30 DIAGNOSIS — B182 Chronic viral hepatitis C: Secondary | ICD-10-CM

## 2016-06-30 NOTE — Progress Notes (Signed)
HPI: Kristina Huffman is a 55 y.o. female who presents to the [facility] clinic today for follow-up of her Hep C infection. She has genotype 1a and started Zepatier on 06/10/16.   No results found for: HCVGENOTYPE, HEPCGENOTYPE  Allergies: Allergies  Allergen Reactions  . Sulfa Antibiotics Shortness Of Breath and Swelling    Tight in throat  . Aspirin Other (See Comments)    "Ringing in ears"    Past Medical History: Past Medical History:  Diagnosis Date  . Anxiety   . Asthma   . Depression   . ETOH abuse   . Hepatitis C   . Heroin abuse   . History of MRSA infection    legs and spread to face    Social History: Social History   Social History  . Marital status: Married    Spouse name: N/A  . Number of children: N/A  . Years of education: N/A   Social History Main Topics  . Smoking status: Never Smoker  . Smokeless tobacco: Never Used  . Alcohol use No     Comment: clean for 1 year and 2 months  . Drug use: No     Comment: no drug use for 3 years  . Sexual activity: Yes    Birth control/ protection: None   Other Topics Concern  . Not on file   Social History Narrative  . No narrative on file    Labs: Hep B S Ab (no units)  Date Value  05/07/2016 NEG   Hepatitis B Surface Ag (no units)  Date Value  05/07/2016 NEGATIVE    No results found for: HCVGENOTYPE, HEPCGENOTYPE  No flowsheet data found.  AST (U/L)  Date Value  09/20/2013 72 (H)  07/03/2013 59 (H)  06/28/2013 49 (H)   ALT (U/L)  Date Value  09/20/2013 99 (H)  07/03/2013 97 (H)  06/28/2013 61 (H)   INR (no units)  Date Value  05/07/2016 1.0  06/29/2013 0.94    CrCl: CrCl cannot be calculated (Unknown ideal weight.).  Fibrosis Score: Unsure  Child-Pugh Score:   Previous Treatment Regimen: None  Assessment: [name] is here for follow-up of her Hep C.  She has been taking Zepatier for ~3 weeks. She does admit to missing one dose - she stated that she forgot to take it.   She also asked what to do with the extra pill.  I told her to finish out the bottle.  I also encouraged her not to miss anymore doses and explained the importance of compliance to her in regards to treatment failure and resistance.  She seemed to understand better after our conversation.  We do not have a fibrosure or elastography on her due to her insurance situation.  She has an orange card and is in the process of obtaining [facility] assistance.  Her husband also has Hep C (reportedly for ~17 years) who has not been treated yet.  She states he is "yellow sometimes, confused, and has fluid on his belly".  I explained to her the 4 stages of the fibrosis score and told her it sounded like he needed to get treated sooner rather than later.  She has been trying to convince him to be treated, and I assured her that he has to be ready before we can start treatment.   We will not get labs today as to not add another bill for her. I made an appointment for [facility] labs and then an appointment with Dr. Linus Salmons shortly  after.  I'll keep her on my radar and check on her from time to time.   Plans: - Continue Zepatier x 12 weeks - Lab appt 11/24/16 at 10am - Appt with Dr. Linus Salmons 12/09/16 at 3pm  Cassie L. Westley Gambles, PharmD Infectious Diseases [facility] 06/30/2016, 3:47 PM

## 2016-07-01 ENCOUNTER — Telehealth: Payer: Self-pay | Admitting: *Deleted

## 2016-07-01 NOTE — Telephone Encounter (Signed)
Call from [place], Therapist, sports at [facility] (referring office), stating patient called there c/o vomiting bright red blood yesterday evening and is concerned it may be a side effect of the Zepatier. Spoke to [employer], pharmacist and Dr. Linus Salmons and per Dr. Linus Salmons this is not a side effect of the medication and she should go to the ED for evaluation. Patient informed and was happy to hear it was not a side effect and she can continue to take this medication. She will go to the ED.

## 2016-08-18 ENCOUNTER — Telehealth: Payer: Self-pay | Admitting: Pharmacist

## 2016-08-18 ENCOUNTER — Other Ambulatory Visit: Payer: Self-pay | Admitting: Pharmacist

## 2016-08-18 DIAGNOSIS — B182 Chronic viral hepatitis C: Secondary | ICD-10-CM

## 2016-08-18 NOTE — Telephone Encounter (Signed)
duplicate

## 2016-08-18 NOTE — Telephone Encounter (Signed)
Kristina Huffman called me and said that she got assistance from [place] until Feb 2018.  Will bring her back in for a hep c VL and fibrosure on Thursday.

## 2016-08-21 ENCOUNTER — Other Ambulatory Visit: Payer: Self-pay

## 2016-08-28 ENCOUNTER — Other Ambulatory Visit: Payer: Self-pay

## 2016-08-28 DIAGNOSIS — B182 Chronic viral hepatitis C: Secondary | ICD-10-CM

## 2016-08-29 LAB — HEPATITIS C RNA QUANTITATIVE: HCV Quantitative: NOT DETECTED IU/mL (ref <15)

## 2016-09-01 LAB — LIVER FIBROSIS, FIBROTEST-ACTITEST
ALT: 35 U/L — ABNORMAL HIGH (ref 6–29)
Alpha-2-Macroglobulin: 216 mg/dL (ref 106–279)
Apolipoprotein A1: 145 mg/dL (ref 101–198)
Bilirubin: 0.6 mg/dL (ref 0.2–1.2)
Fibrosis Score: 0.21
GGT: 25 U/L (ref 3–70)
Haptoglobin: 145 mg/dL (ref 43–212)
Necroinflammat ACT Score: 0.16
Reference ID: 1732450

## 2016-09-03 ENCOUNTER — Other Ambulatory Visit: Payer: Self-pay | Admitting: Pharmacist

## 2016-09-03 DIAGNOSIS — B182 Chronic viral hepatitis C: Secondary | ICD-10-CM

## 2016-09-08 ENCOUNTER — Ambulatory Visit: Payer: Self-pay

## 2016-11-24 ENCOUNTER — Other Ambulatory Visit: Payer: Self-pay

## 2016-11-24 DIAGNOSIS — B182 Chronic viral hepatitis C: Secondary | ICD-10-CM

## 2016-11-27 LAB — HEPATITIS C RNA QUANTITATIVE
HCV Quantitative Log: <1.18 Log IU/mL
HCV Quantitative: <15 IU/mL

## 2016-12-01 ENCOUNTER — Ambulatory Visit: Payer: Self-pay

## 2016-12-03 ENCOUNTER — Telehealth: Payer: Self-pay | Admitting: Pharmacist

## 2016-12-03 NOTE — Telephone Encounter (Signed)
Kristina Huffman missed her cure visit on Monday because our clinic closed early due to snow.  She called wanting to know what her Hep C viral load was that was checked several weeks ago.  Told her it was undetectable and she was cured.  Congratulated her and told her to let us know if there's anything we can do for her in the future.

## 2016-12-09 ENCOUNTER — Ambulatory Visit: Payer: Self-pay | Admitting: Internal Medicine

## 2017-03-18 ENCOUNTER — Telehealth (INDEPENDENT_AMBULATORY_CARE_PROVIDER_SITE_OTHER): Payer: Self-pay | Admitting: Physician Assistant

## 2017-03-18 ENCOUNTER — Ambulatory Visit (INDEPENDENT_AMBULATORY_CARE_PROVIDER_SITE_OTHER): Payer: Medicaid Other | Admitting: Physician Assistant

## 2017-03-18 ENCOUNTER — Encounter (HOSPITAL_COMMUNITY): Payer: Self-pay | Admitting: *Deleted

## 2017-03-18 ENCOUNTER — Encounter (INDEPENDENT_AMBULATORY_CARE_PROVIDER_SITE_OTHER): Payer: Self-pay | Admitting: Physician Assistant

## 2017-03-18 ENCOUNTER — Emergency Department (HOSPITAL_COMMUNITY): Payer: Medicaid Other

## 2017-03-18 ENCOUNTER — Emergency Department (HOSPITAL_COMMUNITY)
Admission: EM | Admit: 2017-03-18 | Discharge: 2017-03-18 | Disposition: A | Discharge status: Home / Self Care | Payer: Medicaid Other | Attending: Emergency Medicine | Admitting: Emergency Medicine

## 2017-03-18 VITALS — BP 114/77 | HR 76 | Temp 98.5°F | Ht 66.0 in | Wt 256.2 lb

## 2017-03-18 DIAGNOSIS — R0789 Other chest pain: Secondary | ICD-10-CM | POA: Diagnosis not present

## 2017-03-18 DIAGNOSIS — Z8614 Personal history of Methicillin resistant Staphylococcus aureus infection: Secondary | ICD-10-CM | POA: Insufficient documentation

## 2017-03-18 DIAGNOSIS — Z7951 Long term (current) use of inhaled steroids: Secondary | ICD-10-CM | POA: Insufficient documentation

## 2017-03-18 DIAGNOSIS — J45909 Unspecified asthma, uncomplicated: Secondary | ICD-10-CM | POA: Insufficient documentation

## 2017-03-18 DIAGNOSIS — R079 Chest pain, unspecified: Secondary | ICD-10-CM | POA: Diagnosis not present

## 2017-03-18 DIAGNOSIS — R42 Dizziness and giddiness: Secondary | ICD-10-CM

## 2017-03-18 DIAGNOSIS — I10 Essential (primary) hypertension: Secondary | ICD-10-CM

## 2017-03-18 DIAGNOSIS — R9431 Abnormal electrocardiogram [ECG] [EKG]: Secondary | ICD-10-CM

## 2017-03-18 DIAGNOSIS — F319 Bipolar disorder, unspecified: Secondary | ICD-10-CM

## 2017-03-18 DIAGNOSIS — F10920 Alcohol use, unspecified with intoxication, uncomplicated: Secondary | ICD-10-CM | POA: Diagnosis not present

## 2017-03-18 DIAGNOSIS — Z79899 Other long term (current) drug therapy: Secondary | ICD-10-CM | POA: Insufficient documentation

## 2017-03-18 DIAGNOSIS — R0602 Shortness of breath: Secondary | ICD-10-CM | POA: Diagnosis present

## 2017-03-18 LAB — BASIC METABOLIC PANEL
Anion gap: 7 (ref 5–15)
BUN: 16 mg/dL (ref 6–20)
CO2: 28 mmol/L (ref 22–32)
Calcium: 9.5 mg/dL (ref 8.9–10.3)
Chloride: 104 mmol/L (ref 101–111)
Creatinine, Ser: 0.87 mg/dL (ref 0.44–1.00)
GFR calc Af Amer: >60 mL/min (ref >60)
GFR calc non Af Amer: >60 mL/min (ref >60)
Glucose, Bld: 139 mg/dL — ABNORMAL HIGH (ref 65–99)
Potassium: 3.8 mmol/L (ref 3.5–5.1)
Sodium: 139 mmol/L (ref 135–145)

## 2017-03-18 LAB — I-STAT TROPONIN, ED: Troponin i, poc: 0 ng/mL (ref 0.00–0.08)

## 2017-03-18 LAB — CBC
HCT: 46.1 % — ABNORMAL HIGH (ref 36.0–46.0)
Hemoglobin: 15.2 g/dL — ABNORMAL HIGH (ref 12.0–15.0)
MCH: 30.8 pg (ref 26.0–34.0)
MCHC: 33 g/dL (ref 30.0–36.0)
MCV: 93.3 fL (ref 78.0–100.0)
Platelets: 209 10*3/uL (ref 150–400)
RBC: 4.94 MIL/uL (ref 3.87–5.11)
RDW: 13.7 % (ref 11.5–15.5)
WBC: 4.8 10*3/uL (ref 4.0–10.5)

## 2017-03-18 LAB — D-DIMER, QUANTITATIVE (NOT AT ARMC): D-Dimer, Quant: <0.27 ug/mL-FEU (ref 0.00–0.50)

## 2017-03-18 MED ORDER — LISINOPRIL 40 MG PO TABS: 40.0000 mg | ORAL_TABLET | Freq: Every day | ORAL | 3 refills | Status: DC

## 2017-03-18 MED ORDER — QUETIAPINE FUMARATE 300 MG PO TABS: 300.0000 mg | ORAL_TABLET | Freq: Two times a day (BID) | ORAL | 3 refills | Status: DC

## 2017-03-18 NOTE — ED Notes (Signed)
Patient transported to X-ray 

## 2017-03-18 NOTE — Telephone Encounter (Signed)
FWD to PCp. Nat Christen, CMA

## 2017-03-18 NOTE — Telephone Encounter (Signed)
Patient called left voicemail stated she was seen today at [facility] would like a call back and she is confused on what she is supposed to do. Stated she went to E.R.  Please follow up with patient.

## 2017-03-18 NOTE — ED Triage Notes (Signed)
Pt reports "feeling like something heavy is on her chest" with dizziness and head pressure x 3 months. Pt went to an ucc today and sent here due to abnormal ekg. No acute distress is noted at triage.

## 2017-03-18 NOTE — ED Provider Notes (Signed)
[facility] DEPT Provider Note    By signing my name below, I, Bea Graff, attest that this documentation has been prepared under the direction and in the presence of Virgel Manifold, MD. Electronically Signed: Bea Graff, ED Scribe. 03/18/17. 12:13 PM.    History   Chief Complaint Chief Complaint  Patient presents with  . Chest Pain  . Dizziness    The history is provided by the patient and medical records. No language interpreter was used.    Kristina Huffman is an obese 56 y.o. female with PMHx of polysubstance abuse, Hepatitis C, depression, anxiety and asthma who presents to the Emergency Department complaining of worsening shooting CP that began two months ago. She reports associated chest pressure, SOB and dizziness. She reports increased fatigue and states her LLE is slightly more swollen than the right. Pt reports being seen by her PCP this morning and was advised to come here secondary to an abnormal EKG. She has taken ASA in the past with temporary resolution of the pain. She has been using her MDI more frequently lately for SOB. Deep breathing intermittently increases the pain. She denies alleviating factors. She denies fever, chills, cough, nausea, vomiting. She reports both of her parents had early cardiac history and are both deceased secondary to cardiac problems. She reports having a stress test in the past for similar symptoms.    Past Medical History:  Diagnosis Date  . Anxiety   . Asthma   . Depression   . ETOH abuse   . Hepatitis C   . Heroin abuse   . History of MRSA infection    legs and spread to face    Patient Active Problem List   Diagnosis Date Noted  . Chronic hepatitis C without hepatic coma ([place]) 06/04/2016  . Substance induced mood disorder ([place]) 09/20/2013  . Alcohol dependency ([place]) 11/11/2011    Class: Acute  . Benzodiazepine dependence ([place]) 09/21/2011  . Bipolar 1 disorder, mixed, moderate ([place]) 09/21/2011  . Complicated  bereavement 09/21/2011  . PTSD (post-traumatic stress disorder) 09/21/2011  . Homeless 09/21/2011    Past Surgical History:  Procedure Laterality Date  . BACK SURGERY    . RADIAL HEAD ARTHROPLASTY  08/09/2012   Procedure: RADIAL HEAD ARTHROPLASTY;  Surgeon: Schuyler Amor, MD;  Location: [facility];  Service: Orthopedics;  Laterality: Left;  Left Radial head Replacement    OB History    Gravida Para Term Preterm AB Living   5       1     SAB TAB Ectopic Multiple Live Births   1       4       Home Medications    Prior to Admission medications   Medication Sig Start Date End Date Taking? Authorizing Provider  albuterol (PROVENTIL HFA;VENTOLIN HFA) 108 (90 BASE) MCG/ACT inhaler Inhale 2 puffs into the lungs every 6 (six) hours as needed for wheezing. 09/23/13  Yes Lindell Spar I, NP  escitalopram (LEXAPRO) 10 MG tablet Take 10 mg by mouth daily.   Yes [provider]  gabapentin (NEURONTIN) 100 MG capsule Take 300 mg by mouth 2 (two) times daily.    Yes [provider]  pantoprazole (PROTONIX) 40 MG tablet Take 40 mg by mouth daily as needed (acid reflux).   Yes [provider]  QUEtiapine (SEROQUEL) 300 MG tablet Take 1 tablet (300 mg total) by mouth 2 (two) times daily. 03/18/17  Yes Clent Demark, [name]  lisinopril (PRINIVIL,ZESTRIL) 40 MG tablet  Take 1 tablet (40 mg total) by mouth daily. 03/18/17   Clent Demark, [name]    Family History Family History  Problem Relation Age of Onset  . Depression Mother   . Osteoporosis Mother   . COPD Mother   . Diabetes Father   . Hypertension Father   . Congestive Heart Failure Father   . Aneurysm Father     Social History Social History  Substance Use Topics  . Smoking status: Never Smoker  . Smokeless tobacco: Never Used  . Alcohol use No     Comment: clean for 1 year and 2 months     Allergies   Sulfa antibiotics and Aspirin   Review of Systems Review of Systems All other systems  reviewed and are negative for acute change except as noted in the HPI.   Physical Exam Updated Vital Signs BP 115/88 (BP Location: Right Arm)   Pulse 90   Temp 98.5 F (36.9 C) (Oral)   Resp 18   LMP 08/02/2013 Comment: irregular  SpO2 93%   Physical Exam  Constitutional: She is oriented to person, place, and time. She appears well-developed and well-nourished. No distress.  HENT:  Head: Normocephalic and atraumatic.  Eyes: EOM are normal.  Neck: Normal range of motion.  Cardiovascular: Normal rate, regular rhythm and normal heart sounds.   No carotid bruits.  Pulmonary/Chest: Effort normal and breath sounds normal.  Abdominal: Soft. She exhibits no distension. There is no tenderness.  Musculoskeletal: Normal range of motion.  Neurological: She is alert and oriented to person, place, and time.  Skin: Skin is warm and dry.  Psychiatric: She has a normal mood and affect. Judgment normal.  Nursing note and vitals reviewed.    ED Treatments / Results  COORDINATION OF CARE: 12:08 PM- Will order d-dimer and recommended follow up with cardiology. Pt verbalizes understanding and agrees to plan.  Medications - No data to display  Labs (all labs ordered are listed, but only abnormal results are displayed) Labs Reviewed  BASIC METABOLIC PANEL - Abnormal; Notable for the following:       Result Value   Glucose, Bld 139 (*)    All other components within normal limits  CBC - Abnormal; Notable for the following:    Hemoglobin 15.2 (*)    HCT 46.1 (*)    All other components within normal limits  I-STAT TROPOININ, ED    EKG  EKG Interpretation  Date/Time:  Wednesday March 18 2017 10:42:09 EDT Ventricular Rate:  84 PR Interval:  146 QRS Duration: 82 QT Interval:  370 QTC Calculation: 437 R Axis:   1 Text Interpretation:  Normal sinus rhythm Low voltage QRS Cannot rule out Anterior infarct , age undetermined Abnormal ECG No significant change since last tracing Confirmed by  Wilson Singer  MD, Chalon Zobrist [phone]) on 03/18/2017 11:04:02 AM       Radiology Dg Chest 2 View  Result Date: 03/18/2017 CLINICAL DATA:  Episodes of chest pain and pressure sensation with dizziness for the past 2 months. History of asthma, substance abuse, and chronic hepatitis-C. EXAM: CHEST  2 VIEW COMPARISON:  Chest x-ray of August 02, 2013 FINDINGS: The lungs are adequately inflated and clear. The heart and pulmonary vascularity are normal. The mediastinum is normal in width. The bony thorax exhibits no acute abnormality. IMPRESSION: There is no active cardiopulmonary disease. Electronically Signed   By: David  Martinique M.D.   On: 03/18/2017 11:29    Procedures Procedures (including critical care time)  Medications Ordered in ED Medications - No data to display   Initial Impression / Assessment and Plan / ED Course  I have reviewed the triage vital signs and the nursing notes.  Pertinent labs & imaging results that were available during my care of the patient were reviewed by me and considered in my medical decision making (see chart for details).    CP atypical. Had for months. Not clearly exertional. ED w/u fairly unremarkable. I feel appropriate for outpt stress.   Final Clinical Impressions(s) / ED Diagnoses   Final diagnoses:  Chest pain, unspecified type    New Prescriptions New Prescriptions   No medications on file    I personally preformed the services scribed in my presence. The recorded information has been reviewed is accurate. Virgel Manifold, MD.     Virgel Manifold, MD 03/28/17 [phone]

## 2017-03-18 NOTE — ED Notes (Signed)
ED Provider at bedside. 

## 2017-03-18 NOTE — Patient Instructions (Addendum)
Please report to the emergency department. You will need further evaluation of your chest pain.   Nonspecific Chest Pain Chest pain can be caused by many different conditions. There is always a chance that your pain could be related to something serious, such as a heart attack or a blood clot in your lungs. Chest pain can also be caused by conditions that are not life-threatening. If you have chest pain, it is very important to follow up with your health care provider. What are the causes? Causes of this condition include:  Heartburn.  Pneumonia or bronchitis.  Anxiety or stress.  Inflammation around your heart (pericarditis) or lung (pleuritis or pleurisy).  A blood clot in your lung.  A collapsed lung (pneumothorax). This can develop suddenly on its own (spontaneous pneumothorax) or from trauma to the chest.  Shingles infection (varicella-zoster virus).  Heart attack.  Damage to the bones, muscles, and cartilage that make up your chest wall. This can include: ? Bruised bones due to injury. ? Strained muscles or cartilage due to frequent or repeated coughing or overwork. ? Fracture to one or more ribs. ? Sore cartilage due to inflammation (costochondritis).  What increases the risk? Risk factors for this condition may include:  Activities that increase your risk for trauma or injury to your chest.  Respiratory infections or conditions that cause frequent coughing.  Medical conditions or overeating that can cause heartburn.  Heart disease or family history of heart disease.  Conditions or health behaviors that increase your risk of developing a blood clot.  Having had chicken pox (varicella zoster).  What are the signs or symptoms? Chest pain can feel like:  Burning or tingling on the surface of your chest or deep in your chest.  Crushing, pressure, aching, or squeezing pain.  Dull or sharp pain that is worse when you move, cough, or take a deep breath.  Pain  that is also felt in your back, neck, shoulder, or arm, or pain that spreads to any of these areas.  Your chest pain may come and go, or it may stay constant. How is this diagnosed? Lab tests or other studies may be needed to find the cause of your pain. Your health care provider may have you take a test called an ECG (electrocardiogram). An ECG records your heartbeat patterns at the time the test is performed. You may also have other tests, such as:  Transthoracic echocardiogram (TTE). In this test, sound waves are used to create a picture of the heart structures and to look at how blood flows through your heart.  Transesophageal echocardiogram (TEE).This is a more advanced imaging test that takes images from inside your body. It allows your health care provider to see your heart in finer detail.  Cardiac monitoring. This allows your health care provider to monitor your heart rate and rhythm in real time.  Holter monitor. This is a portable device that records your heartbeat and can help to diagnose abnormal heartbeats. It allows your health care provider to track your heart activity for several days, if needed.  Stress tests. These can be done through exercise or by taking medicine that makes your heart beat more quickly.  Blood tests.  Other imaging tests.  How is this treated? Treatment depends on what is causing your chest pain. Treatment may include:  Medicines. These may include: ? Acid blockers for heartburn. ? Anti-inflammatory medicine. ? Pain medicine for inflammatory conditions. ? Antibiotic medicine, if an infection is present. ? Medicines to  dissolve blood clots. ? Medicines to treat coronary artery disease (CAD).  Supportive care for conditions that do not require medicines. This may include: ? Resting. ? Applying heat or cold packs to injured areas. ? Limiting activities until pain decreases.  Follow these instructions at home: Medicines  If you were prescribed  an antibiotic, take it as told by your health care provider. Do not stop taking the antibiotic even if you start to feel better.  Take over-the-counter and prescription medicines only as told by your health care provider. Lifestyle  Do not use any products that contain nicotine or tobacco, such as cigarettes and e-cigarettes. If you need help quitting, ask your health care provider.  Do not drink alcohol.  Make lifestyle changes as directed by your health care provider. These may include: ? Getting regular exercise. Ask your health care provider to suggest some activities that are safe for you. ? Eating a heart-healthy diet. A registered dietitian can help you to learn healthy eating options. ? Maintaining a healthy weight. ? Managing diabetes, if necessary. ? Reducing stress, such as with yoga or relaxation techniques. General instructions  Avoid any activities that bring on chest pain.  If heartburn is the cause for your chest pain, raise (elevate) the head of your bed about 6 inches (15 cm) by putting blocks under the legs. Sleeping with more pillows does not effectively relieve heartburn because it only changes the position of your head.  Keep all follow-up visits as told by your health care provider. This is important. This includes any further testing if your chest pain does not go away. Contact a health care provider if:  Your chest pain does not go away.  You have a rash with blisters on your chest.  You have a fever.  You have chills. Get help right away if:  Your chest pain is worse.  You have a cough that gets worse, or you cough up blood.  You have severe pain in your abdomen.  You have severe weakness.  You faint.  You have sudden, unexplained chest discomfort.  You have sudden, unexplained discomfort in your arms, back, neck, or jaw.  You have shortness of breath at any time.  You suddenly start to sweat, or your skin gets clammy.  You feel nauseous or  you vomit.  You suddenly feel light-headed or dizzy.  Your heart begins to beat quickly, or it feels like it is skipping beats. These symptoms may represent a serious problem that is an emergency. Do not wait to see if the symptoms will go away. Get medical help right away. Call your local emergency services (911 in the [place] Do not drive yourself to the hospital. This information is not intended to replace advice given to you by your health care provider. Make sure you discuss any questions you have with your health care provider. Document Released: 06/18/2005 Document Revised: 06/02/2016 Document Reviewed: 06/02/2016 Elsevier Interactive Patient Education  2017 [employer].

## 2017-03-18 NOTE — Progress Notes (Signed)
Subjective:  Patient ID: Kristina Huffman, female    DOB: 1961/01/02  Age: 56 y.o. MRN: [id number]  CC: insomnia, chest pressure  HPI Kristina Huffman is a 56 y.o. female with a PMH of anxiety, depression, bipolar 1 disorder, PTSD, substance abuse, and hepatitis C presents with complaint of insomnia, dizziness, and chest pressure. Chest pressure for 6 months with regular frequency, "happens frequently". Left sided and substernal pressure that radiates to the left jaw. Sometimes left arm with feel numb and weak. Last episode has been 2 days ago. Endorses palpitations, orthopnea, PND, claudication, presnycope.     Initiating sleep is difficult. Goes to bed at 2230 and finally goes to sleep at 0400 hrs. Has racing thoughts. Does not think Seroquel is working well enough for her. Is taking medication as prescribed. Saw psychiatry two months ago. She has just been approved for Medicaid and is looking for another psychiatrist.   Outpatient Medications Prior to Visit  Medication Sig Dispense Refill  . albuterol (PROVENTIL HFA;VENTOLIN HFA) 108 (90 BASE) MCG/ACT inhaler Inhale 2 puffs into the lungs every 6 (six) hours as needed for wheezing. 1 Inhaler 0  . escitalopram (LEXAPRO) 10 MG tablet Take 10 mg by mouth daily.    Marland Kitchen gabapentin (NEURONTIN) 100 MG capsule Take 100 mg by mouth 3 (three) times daily.    . hydrochlorothiazide (HYDRODIURIL) 25 MG tablet Take 25 mg by mouth daily.    . QUEtiapine (SEROQUEL) 100 MG tablet Take 100 mg by mouth 4 (four) times daily.    . Elbasvir-Grazoprevir (ZEPATIER) 50-100 MG TABS Take 1 tablet by mouth daily. (Patient not taking: Reported on 03/18/2017) 28 tablet 2   No facility-administered medications prior to visit.       ROS Review of Systems  Constitutional: Negative for chills, fever and malaise/fatigue.  Eyes: Negative for blurred vision.  Respiratory: Negative for shortness of breath.   Cardiovascular: Positive for chest pain, palpitations, orthopnea,  claudication, leg swelling and PND.  Gastrointestinal: Negative for abdominal pain and nausea.  Genitourinary: Negative for dysuria and hematuria.  Musculoskeletal: Negative for joint pain and myalgias.  Skin: Negative for rash.  Neurological: Positive for tingling. Negative for headaches.  Psychiatric/Behavioral: Negative for depression. The patient has insomnia. The patient is not nervous/anxious.     Objective:  BP 114/77 (BP Location: Left Arm, Patient Position: Sitting, Cuff Size: Large)   Pulse 76   Temp 98.5 F (36.9 C) (Oral)   Ht 5\' 6"  (1.676 m)   Wt 256 lb 3.2 oz (116.2 kg)   LMP 08/02/2013 Comment: irregular  SpO2 94%   BMI 41.35 kg/m   BP/Weight 03/18/2017 06/04/2016 8/41/3244  Systolic BP 010 272 536  Diastolic BP 77 88 70  Wt. (Lbs) 256.2 255 258.6  BMI 41.35 41.16 41.74  Some encounter information is confidential and restricted. Go to Review Flowsheets activity to see all data.      Physical Exam  Constitutional: She is oriented to person, place, and time.  Well developed, obese, NAD, polite  HENT:  Head: Normocephalic and atraumatic.  Eyes: No scleral icterus.  Neck: Normal range of motion. Neck supple. No thyromegaly present.  Cardiovascular: Normal rate, regular rhythm and normal heart sounds.   Pulmonary/Chest: Effort normal and breath sounds normal.  Musculoskeletal: She exhibits no edema.  Neurological: She is alert and oriented to person, place, and time. No cranial nerve deficit. Coordination normal.  Skin: Skin is warm and dry. No rash noted. No erythema. No pallor.  Psychiatric: She has  a normal mood and affect. Her behavior is normal. Thought content normal.  Vitals reviewed.    Assessment & Plan:   1. Bipolar I disorder ([place]) - CBC with Differential - Comprehensive metabolic panel - Increase QUEtiapine (SEROQUEL) 300 MG tablet; Take 1 tablet (300 mg total) by mouth 2 (two) times daily.  Dispense: 60 tablet; Refill: 3 - Referral to  psychiatry  2. Chest pressure - Brain natriuretic peptide - EKG 12-Lead abnormal in clinic today  3. Nonspecific abnormal EKG - T wave flattening on precordial leads.  - Advised to go to ED for further evaluation.  4. Essential hypertension - Brain natriuretic peptide - EKG 12-Lead abnormal in clinic today.   4. Lightheadedness - Brain natriuretic peptide - EKG 12-Lead abnormal in clinic today.   Meds ordered this encounter  Medications  . lisinopril (PRINIVIL,ZESTRIL) 40 MG tablet    Sig: Take 1 tablet (40 mg total) by mouth daily.    Dispense:  90 tablet    Refill:  3    Order Specific Question:   Supervising Provider    Answer:   Tresa Garter [id number]  . QUEtiapine (SEROQUEL) 300 MG tablet    Sig: Take 1 tablet (300 mg total) by mouth 2 (two) times daily.    Dispense:  60 tablet    Refill:  3    Order Specific Question:   Supervising Provider    Answer:   Tresa Garter [id number]    Follow-up: Return in about 2 weeks (around 04/01/2017).   Clent Demark PA

## 2017-03-19 LAB — COMPREHENSIVE METABOLIC PANEL
ALT: 49 IU/L — ABNORMAL HIGH (ref 0–32)
AST: 38 IU/L (ref 0–40)
Albumin/Globulin Ratio: 1.7 (ref 1.2–2.2)
Albumin: 4.5 g/dL (ref 3.5–5.5)
Alkaline Phosphatase: 92 IU/L (ref 39–117)
BUN/Creatinine Ratio: 19 (ref 9–23)
BUN: 18 mg/dL (ref 6–24)
Bilirubin Total: 0.7 mg/dL (ref 0.0–1.2)
CO2: 21 mmol/L (ref 20–29)
Calcium: 9.7 mg/dL (ref 8.7–10.2)
Chloride: 100 mmol/L (ref 96–106)
Creatinine, Ser: 0.97 mg/dL (ref 0.57–1.00)
GFR calc Af Amer: 77 mL/min/{1.73_m2} (ref >59)
GFR calc non Af Amer: 67 mL/min/{1.73_m2} (ref >59)
Globulin, Total: 2.7 g/dL (ref 1.5–4.5)
Glucose: 139 mg/dL — ABNORMAL HIGH (ref 65–99)
Potassium: 3.9 mmol/L (ref 3.5–5.2)
Sodium: 138 mmol/L (ref 134–144)
Total Protein: 7.2 g/dL (ref 6.0–8.5)

## 2017-03-19 LAB — CBC WITH DIFFERENTIAL/PLATELET
Basophils Absolute: 0 10*3/uL (ref 0.0–0.2)
Basos: 1 %
EOS (ABSOLUTE): 0.2 10*3/uL (ref 0.0–0.4)
Eos: 3 %
Hematocrit: 46.7 % — ABNORMAL HIGH (ref 34.0–46.6)
Hemoglobin: 14.8 g/dL (ref 11.1–15.9)
Immature Grans (Abs): 0 10*3/uL (ref 0.0–0.1)
Immature Granulocytes: 0 %
Lymphocytes Absolute: 1.8 10*3/uL (ref 0.7–3.1)
Lymphs: 37 %
MCH: 30.5 pg (ref 26.6–33.0)
MCHC: 31.7 g/dL (ref 31.5–35.7)
MCV: 96 fL (ref 79–97)
Monocytes Absolute: 0.3 10*3/uL (ref 0.1–0.9)
Monocytes: 6 %
Neutrophils Absolute: 2.5 10*3/uL (ref 1.4–7.0)
Neutrophils: 53 %
Platelets: 209 10*3/uL (ref 150–379)
RBC: 4.85 x10E6/uL (ref 3.77–5.28)
RDW: 14.5 % (ref 12.3–15.4)
WBC: 4.7 10*3/uL (ref 3.4–10.8)

## 2017-03-19 LAB — BRAIN NATRIURETIC PEPTIDE: BNP: 3.4 pg/mL (ref 0.0–100.0)

## 2017-03-19 NOTE — Telephone Encounter (Signed)
She was supposed to go to the ED.

## 2017-04-01 ENCOUNTER — Ambulatory Visit (INDEPENDENT_AMBULATORY_CARE_PROVIDER_SITE_OTHER): Payer: Self-pay | Admitting: Physician Assistant

## 2017-05-16 ENCOUNTER — Encounter (HOSPITAL_COMMUNITY): Payer: Self-pay

## 2017-05-16 ENCOUNTER — Emergency Department (HOSPITAL_COMMUNITY)
Admission: EM | Admit: 2017-05-16 | Discharge: 2017-05-16 | Disposition: A | Discharge status: Home / Self Care | Payer: Medicaid Other | Attending: Emergency Medicine | Admitting: Emergency Medicine

## 2017-05-16 DIAGNOSIS — F1110 Opioid abuse, uncomplicated: Secondary | ICD-10-CM | POA: Diagnosis not present

## 2017-05-16 DIAGNOSIS — Z79899 Other long term (current) drug therapy: Secondary | ICD-10-CM | POA: Diagnosis not present

## 2017-05-16 DIAGNOSIS — Z760 Encounter for issue of repeat prescription: Secondary | ICD-10-CM | POA: Diagnosis not present

## 2017-05-16 DIAGNOSIS — F419 Anxiety disorder, unspecified: Secondary | ICD-10-CM | POA: Diagnosis not present

## 2017-05-16 DIAGNOSIS — J45909 Unspecified asthma, uncomplicated: Secondary | ICD-10-CM | POA: Insufficient documentation

## 2017-05-16 DIAGNOSIS — Z96622 Presence of left artificial elbow joint: Secondary | ICD-10-CM | POA: Insufficient documentation

## 2017-05-16 DIAGNOSIS — F319 Bipolar disorder, unspecified: Secondary | ICD-10-CM | POA: Diagnosis not present

## 2017-05-16 MED ORDER — PANTOPRAZOLE SODIUM 40 MG PO TBEC: 40.0000 mg | DELAYED_RELEASE_TABLET | Freq: Every day | ORAL | 0 refills | Status: DC

## 2017-05-16 MED ORDER — ESCITALOPRAM OXALATE 20 MG PO TABS: 20.0000 mg | ORAL_TABLET | Freq: Every day | ORAL | 0 refills | Status: DC

## 2017-05-16 NOTE — ED Notes (Signed)
Patient updated on wait time 

## 2017-05-16 NOTE — ED Triage Notes (Signed)
Pt reports she in in between drs and ran out of lexapro and is requesting refill

## 2017-05-16 NOTE — ED Provider Notes (Signed)
[facility] DEPT Provider Note   CSN: [id number] Arrival date & time: 05/16/17  1503     History   Chief Complaint Chief Complaint  Patient presents with  . Medication Refill    HPI Kristina Huffman is a 56 y.o. female.  55 year old female with history of anxiety/depression, GERD, substance abuse who presents for medication refill. She ran out of her Lexapro and protonix. Other meds provided by mail order refill. She recently qualified for medicaid/medicare disability and is changing PCPs. She has appt scheduled in Sept. She has been out of lexapro for 4 days and can tell because she feels bad. She admits that it is her own fault that she has not been able to get medications yet.   The history is provided by the patient.  Medication Refill  Medications/supplies requested:  Lexapro, protonix Reason for request:  Medications ran out Medications taken before: yes - see home medications   Patient has complete original prescription information: yes     Past Medical History:  Diagnosis Date  . Anxiety   . Asthma   . Depression   . ETOH abuse   . Hepatitis C   . Heroin abuse   . History of MRSA infection    legs and spread to face    Patient Active Problem List   Diagnosis Date Noted  . Chronic hepatitis C without hepatic coma ([place]) 06/04/2016  . Substance induced mood disorder ([place]) 09/20/2013  . Alcohol dependency ([place]) 11/11/2011    Class: Acute  . Benzodiazepine dependence ([place]) 09/21/2011  . Bipolar 1 disorder, mixed, moderate ([place]) 09/21/2011  . Complicated bereavement 38/18/2993  . PTSD (post-traumatic stress disorder) 09/21/2011  . Homeless 09/21/2011    Past Surgical History:  Procedure Laterality Date  . BACK SURGERY    . RADIAL HEAD ARTHROPLASTY  08/09/2012   Procedure: RADIAL HEAD ARTHROPLASTY;  Surgeon: Schuyler Amor, MD;  Location: [facility];  Service: Orthopedics;  Laterality: Left;  Left Radial head Replacement    OB History    Gravida Para  Term Preterm AB Living   5       1     SAB TAB Ectopic Multiple Live Births   1       4       Home Medications    Prior to Admission medications   Medication Sig Start Date End Date Taking? Authorizing Provider  albuterol (PROVENTIL HFA;VENTOLIN HFA) 108 (90 BASE) MCG/ACT inhaler Inhale 2 puffs into the lungs every 6 (six) hours as needed for wheezing. 09/23/13   Lindell Spar I, NP  escitalopram (LEXAPRO) 20 MG tablet Take 1 tablet (20 mg total) by mouth daily. 05/16/17   Kendyll Huettner, Wenda Overland, MD  gabapentin (NEURONTIN) 100 MG capsule Take 300 mg by mouth 2 (two) times daily.     [provider]  lisinopril (PRINIVIL,ZESTRIL) 40 MG tablet Take 1 tablet (40 mg total) by mouth daily. 03/18/17   Clent Demark, [name]  pantoprazole (PROTONIX) 40 MG tablet Take 1 tablet (40 mg total) by mouth daily. 05/16/17   Aileen Amore, Wenda Overland, MD  QUEtiapine (SEROQUEL) 300 MG tablet Take 1 tablet (300 mg total) by mouth 2 (two) times daily. 03/18/17   Clent Demark, [name]    Family History Family History  Problem Relation Age of Onset  . Depression Mother   . Osteoporosis Mother   . COPD Mother   . Diabetes Father   . Hypertension Father   . Congestive Heart Failure Father   .  Aneurysm Father     Social History Social History  Substance Use Topics  . Smoking status: Never Smoker  . Smokeless tobacco: Never Used  . Alcohol use No     Comment: clean for 1 year and 2 months     Allergies   Sulfa antibiotics and Aspirin   Review of Systems Review of Systems  Constitutional: Positive for activity change. Negative for fever.       Malaise  Gastrointestinal: Negative for vomiting.     Physical Exam Updated Vital Signs BP (!) 148/95 (BP Location: Right Arm)   Pulse 83   Temp 99.9 F (37.7 C) (Oral)   Resp 20   LMP 08/02/2013 Comment: irregular  SpO2 100%   Physical Exam  Constitutional: She is oriented to person, place, and time. She appears well-developed and  well-nourished. No distress.  HENT:  Head: Normocephalic and atraumatic.  Eyes: Conjunctivae are normal.  Neck: Neck supple.  Neurological: She is alert and oriented to person, place, and time.  Skin: Skin is warm and dry.  Psychiatric: She has a normal mood and affect. Judgment normal.  Calm, cooperative  Nursing note and vitals reviewed.    ED Treatments / Results  Labs (all labs ordered are listed, but only abnormal results are displayed) Labs Reviewed - No data to display  EKG  EKG Interpretation None       Radiology No results found.  Procedures Procedures (including critical care time)  Medications Ordered in ED Medications - No data to display   Initial Impression / Assessment and Plan / ED Course  I have reviewed the triage vital signs and the nursing notes.     Pt here for med refill, has tolerated well in past. Does have upcoming appt for new PCP at end of Sept. I emphasized importance of f/u as ED is inappropriate place for chronic medication prescriptions. Pt voiced understanding.  Final Clinical Impressions(s) / ED Diagnoses   Final diagnoses:  Medication refill    New Prescriptions New Prescriptions   ESCITALOPRAM (LEXAPRO) 20 MG TABLET    Take 1 tablet (20 mg total) by mouth daily.   PANTOPRAZOLE (PROTONIX) 40 MG TABLET    Take 1 tablet (40 mg total) by mouth daily.     Arlin Savona, Wenda Overland, MD 05/16/17 [phone]

## 2017-10-06 DIAGNOSIS — K219 Gastro-esophageal reflux disease without esophagitis: Secondary | ICD-10-CM | POA: Insufficient documentation

## 2017-10-12 ENCOUNTER — Encounter (HOSPITAL_COMMUNITY): Payer: Self-pay | Admitting: Emergency Medicine

## 2017-10-12 ENCOUNTER — Emergency Department (HOSPITAL_COMMUNITY)
Admission: EM | Admit: 2017-10-12 | Discharge: 2017-10-12 | Disposition: A | Discharge status: Home / Self Care | Payer: Medicaid Other | Attending: Emergency Medicine | Admitting: Emergency Medicine

## 2017-10-12 ENCOUNTER — Other Ambulatory Visit: Payer: Self-pay

## 2017-10-12 ENCOUNTER — Emergency Department (HOSPITAL_COMMUNITY): Payer: Medicaid Other

## 2017-10-12 DIAGNOSIS — K769 Liver disease, unspecified: Secondary | ICD-10-CM

## 2017-10-12 DIAGNOSIS — J45909 Unspecified asthma, uncomplicated: Secondary | ICD-10-CM | POA: Insufficient documentation

## 2017-10-12 DIAGNOSIS — K869 Disease of pancreas, unspecified: Secondary | ICD-10-CM

## 2017-10-12 DIAGNOSIS — R079 Chest pain, unspecified: Secondary | ICD-10-CM

## 2017-10-12 DIAGNOSIS — R55 Syncope and collapse: Secondary | ICD-10-CM

## 2017-10-12 DIAGNOSIS — Z79899 Other long term (current) drug therapy: Secondary | ICD-10-CM | POA: Insufficient documentation

## 2017-10-12 HISTORY — DX: Essential (primary) hypertension: I10

## 2017-10-12 LAB — I-STAT TROPONIN, ED
Troponin i, poc: 0 ng/mL (ref 0.00–0.08)
Troponin i, poc: 0 ng/mL (ref 0.00–0.08)

## 2017-10-12 LAB — BASIC METABOLIC PANEL
Anion gap: 13 (ref 5–15)
BUN: 14 mg/dL (ref 6–20)
CO2: 24 mmol/L (ref 22–32)
Calcium: 9.4 mg/dL (ref 8.9–10.3)
Chloride: 103 mmol/L (ref 101–111)
Creatinine, Ser: 1.05 mg/dL — ABNORMAL HIGH (ref 0.44–1.00)
GFR calc Af Amer: >60 mL/min (ref >60)
GFR calc non Af Amer: 59 mL/min — ABNORMAL LOW (ref >60)
Glucose, Bld: 188 mg/dL — ABNORMAL HIGH (ref 65–99)
Potassium: 3.9 mmol/L (ref 3.5–5.1)
Sodium: 140 mmol/L (ref 135–145)

## 2017-10-12 LAB — CBC
HCT: 45.9 % (ref 36.0–46.0)
Hemoglobin: 15.3 g/dL — ABNORMAL HIGH (ref 12.0–15.0)
MCH: 31.4 pg (ref 26.0–34.0)
MCHC: 33.3 g/dL (ref 30.0–36.0)
MCV: 94.3 fL (ref 78.0–100.0)
Platelets: 247 10*3/uL (ref 150–400)
RBC: 4.87 MIL/uL (ref 3.87–5.11)
RDW: 13.7 % (ref 11.5–15.5)
WBC: 6.8 10*3/uL (ref 4.0–10.5)

## 2017-10-12 LAB — LIPASE, BLOOD: Lipase: 41 U/L (ref 11–51)

## 2017-10-12 LAB — I-STAT BETA HCG BLOOD, ED (MC, WL, AP ONLY): I-stat hCG, quantitative: <5 m[IU]/mL (ref <5)

## 2017-10-12 MED ORDER — IOPAMIDOL (ISOVUE-370) INJECTION 76%
INTRAVENOUS | Status: AC
  Administered 2017-10-12: 100 mL
  Filled 2017-10-12: qty 100

## 2017-10-12 NOTE — ED Provider Notes (Signed)
[facility] EMERGENCY DEPARTMENT Provider Note   CSN: [id number] Arrival date & time: 10/12/17  1829     History   Chief Complaint Chief Complaint  Patient presents with  . Back Pain  . Jaw Pain    HPI Kisa Fujii is a 57 y.o. female with a hx of polysubstance abuse, Hepatitis C, depression, anxiety, asthma presents to the Emergency Department complaining of gradual, persistent, progressively worsening central chest pain onset several hours PTA.  Pt reports central in nature and radiated to her back and bilateral jaw.  She reports she had associated SOB, nausea and diaphoresis.  Patient also reports 2 episodes of near syncope yesterday.  She did not fall or hit her head.  Patient reports she took an aspirin which improved the pain some.  No aggravating factors.  She reports a family history of early cardiac disease in both parents.  Her father is deceased from cardiac etiology.  Patient reports her mother died from a cerebral aneurysm.  Patient denies fevers or chills, headache, neck stiffness, abdominal pain, vomiting, diarrhea, weakness, numbness, tingling.   The history is provided by the patient and medical records. No language interpreter was used.    Past Medical History:  Diagnosis Date  . Anxiety   . Asthma   . Depression   . ETOH abuse   . Hepatitis C   . Heroin abuse ([place])   . History of MRSA infection    legs and spread to face    Patient Active Problem List   Diagnosis Date Noted  . Chronic hepatitis C without hepatic coma ([place]) 06/04/2016  . Substance induced mood disorder ([place]) 09/20/2013  . Alcohol dependency ([place]) 11/11/2011    Class: Acute  . Benzodiazepine dependence ([place]) 09/21/2011  . Bipolar 1 disorder, mixed, moderate ([place]) 09/21/2011  . Complicated bereavement 93/71/6967  . PTSD (post-traumatic stress disorder) 09/21/2011  . Homeless 09/21/2011    Past Surgical History:  Procedure Laterality Date  . BACK SURGERY    .  RADIAL HEAD ARTHROPLASTY  08/09/2012   Procedure: RADIAL HEAD ARTHROPLASTY;  Surgeon: Schuyler Amor, MD;  Location: [facility];  Service: Orthopedics;  Laterality: Left;  Left Radial head Replacement    OB History    Gravida Para Term Preterm AB Living   5       1     SAB TAB Ectopic Multiple Live Births   1       4       Home Medications    Prior to Admission medications   Medication Sig Start Date End Date Taking? Authorizing Provider  albuterol (PROVENTIL HFA;VENTOLIN HFA) 108 (90 BASE) MCG/ACT inhaler Inhale 2 puffs into the lungs every 6 (six) hours as needed for wheezing. 09/23/13   Lindell Spar I, NP  escitalopram (LEXAPRO) 20 MG tablet Take 1 tablet (20 mg total) by mouth daily. 05/16/17   Little, Wenda Overland, MD  gabapentin (NEURONTIN) 100 MG capsule Take 300 mg by mouth 2 (two) times daily.     [provider]  lisinopril (PRINIVIL,ZESTRIL) 40 MG tablet Take 1 tablet (40 mg total) by mouth daily. 03/18/17   Clent Demark, [name]  pantoprazole (PROTONIX) 40 MG tablet Take 1 tablet (40 mg total) by mouth daily. 05/16/17   Little, Wenda Overland, MD  QUEtiapine (SEROQUEL) 300 MG tablet Take 1 tablet (300 mg total) by mouth 2 (two) times daily. 03/18/17   Clent Demark, [name]    Family History Family History  Problem Relation Age of Onset  . Depression Mother   . Osteoporosis Mother   . COPD Mother   . Diabetes Father   . Hypertension Father   . Congestive Heart Failure Father   . Aneurysm Father     Social History Social History   Tobacco Use  . Smoking status: Never Smoker  . Smokeless tobacco: Never Used  Substance Use Topics  . Alcohol use: No    Comment: clean for 1 year and 2 months  . Drug use: No    Comment: no drug use for 3 years     Allergies   Sulfa antibiotics and Aspirin   Review of Systems Review of Systems  Constitutional: Negative for appetite change, diaphoresis, fatigue, fever and unexpected weight change.  HENT: Negative  for mouth sores.   Eyes: Negative for visual disturbance.  Respiratory: Positive for shortness of breath. Negative for cough, chest tightness and wheezing.   Cardiovascular: Negative for chest pain.  Gastrointestinal: Positive for nausea. Negative for abdominal pain, constipation, diarrhea and vomiting.  Endocrine: Negative for polydipsia, polyphagia and polyuria.  Genitourinary: Negative for dysuria, frequency, hematuria and urgency.  Musculoskeletal: Positive for back pain and neck pain (jaw pain). Negative for neck stiffness.  Skin: Negative for rash.  Allergic/Immunologic: Negative for immunocompromised state.  Neurological: Positive for light-headedness ( Near syncope). Negative for syncope and headaches.  Hematological: Does not bruise/bleed easily.  Psychiatric/Behavioral: Negative for sleep disturbance. The patient is not nervous/anxious.      Physical Exam Updated Vital Signs BP 130/82 (BP Location: Right Arm)   Pulse 88   Temp 97.7 F (36.5 C) (Oral)   Resp 16   Ht 5\' 6"  (1.676 m)   Wt 113.4 kg (250 lb)   LMP 08/02/2013 Comment: irregular  SpO2 95%   BMI 40.35 kg/m   Physical Exam  Constitutional: She appears well-developed and well-nourished. No distress.  Awake, alert, nontoxic appearance  HENT:  Head: Normocephalic and atraumatic.  Mouth/Throat: Oropharynx is clear and moist. No oropharyngeal exudate.  Eyes: Conjunctivae are normal. No scleral icterus.  Neck: Normal range of motion. Neck supple.  Cardiovascular: Normal rate, regular rhythm and intact distal pulses.  Pulses:      Radial pulses are 2+ on the right side, and 2+ on the left side.       Dorsalis pedis pulses are 2+ on the right side, and 2+ on the left side.  Pulses strong and equal in all 4 extremities  Pulmonary/Chest: Effort normal and breath sounds normal. No respiratory distress. She has no wheezes.  Equal chest expansion  Abdominal: Soft. Bowel sounds are normal. She exhibits no mass. There  is no tenderness. There is no rebound and no guarding.  Musculoskeletal: Normal range of motion. She exhibits no edema.  Neurological: She is alert.  Speech is clear and goal oriented Moves extremities without ataxia  Skin: Skin is warm and dry. She is not diaphoretic.  Psychiatric: She has a normal mood and affect.  Nursing note and vitals reviewed.    ED Treatments / Results  Labs (all labs ordered are listed, but only abnormal results are displayed) Labs Reviewed  BASIC METABOLIC PANEL - Abnormal; Notable for the following components:      Result Value   Glucose, Bld 188 (*)    Creatinine, Ser 1.05 (*)    GFR calc non Af Amer 59 (*)    All other components within normal limits  CBC - Abnormal; Notable for the following components:  Hemoglobin 15.3 (*)    All other components within normal limits  I-STAT TROPONIN, ED  I-STAT BETA HCG BLOOD, ED ([facility], [facility], [facility] ONLY)  I-STAT TROPONIN, ED    EKG  EKG Interpretation  Date/Time:  Monday October 12 2017 03:26:26 EST Ventricular Rate:  92 PR Interval:  144 QRS Duration: 84 QT Interval:  370 QTC Calculation: 457 R Axis:   25 Text Interpretation:  Normal sinus rhythm Low voltage QRS Cannot rule out Anterior infarct , age undetermined Abnormal ECG no significant change since June 2018 Confirmed by Sherwood Gambler [phone]) on 10/12/2017 6:56:02 AM        Radiology Dg Chest 2 View  Result Date: 10/12/2017 CLINICAL DATA:  Upper back, chest, and bilateral jaw pain today. Recent diagnosis of bronchitis and finished antibiotics. Hypertension. Nonsmoker. EXAM: CHEST  2 VIEW COMPARISON:  03/18/2017 FINDINGS: The heart size and mediastinal contours are within normal limits. Both lungs are clear. The visualized skeletal structures are unremarkable. IMPRESSION: No active cardiopulmonary disease. Electronically Signed   By: Lucienne Capers M.D.   On: 10/12/2017 03:42    Procedures Procedures (including critical care time)  Medications  Ordered in ED Medications  iopamidol (ISOVUE-370) 76 % injection (not administered)     Initial Impression / Assessment and Plan / ED Course  I have reviewed the triage vital signs and the nursing notes.  Pertinent labs & imaging results that were available during my care of the patient were reviewed by me and considered in my medical decision making (see chart for details).     Pt with chest pain radiating into her back and jaw.  Initial work-up reassuring, but symptoms concerning for possible dissection.  Less likely endocarditis.  No petechiae, heart murmur, fever.  Pt is not ill appearing.  Equal pulses in all extremities.    Plan for CT angiogram chest, repeat EKG and repeat troponin.  At shift change care was transferred to [facility], [name] who will follow pending studies, re-evaulate and determine disposition.     Final Clinical Impressions(s) / ED Diagnoses   Final diagnoses:  Central chest pain  Near syncope    ED Discharge Orders    None       Loni Muse Gwenlyn Perking 10/12/17 [place], [place], MD 10/12/17 [phone]

## 2017-10-12 NOTE — ED Provider Notes (Signed)
Care assumed from previous provider PA [place]. Please see note for further details. Case discussed, plan agreed upon. Will follow up on repeat EKG, pending CT angio and repeat troponin. If both of these are negative, discharge home with out patient follow up.   Repeat EKG unchanged.  Repeat troponin negative. CT angio reviewed with attending, Dr. Regenia Skeeter. No aortic dissection or PE. 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 Also shows two lesions within the liver which are likely cysts and possible low-density lesion within the body of the pancreas. Will add lipase given pancreatic lesion. Both of these findings will require further evaluation which can be done as an outpatient.   Lipase wdl.   Patient aware of incidental findings on CT scan. She has pcp and will call to arrange follow up. She feels comfortable with discharge to home. Discussed return precautions and all questions answered.   Kristina Huffman, Kristina Almond, [name] 10/12/17 [id number]    Sherwood Gambler, MD 10/12/17 1002

## 2017-10-12 NOTE — ED Triage Notes (Signed)
Reports waking from sleep with pain between shoulder blades and into bil jaws.  Denies any pain in chest.  Took asa 81mg  X 1 and reports some relief with that.

## 2017-10-12 NOTE — ED Notes (Signed)
Hannah at the bedside seeing patient.  C/o continued pain in back, chest, and jaw.  Repeated ekg and vitals.

## 2017-10-12 NOTE — Discharge Instructions (Signed)
It was my pleasure taking care of you today!   You were seen in the Emergency Department today for chest pain.  As we have discussed, today?s blood work and imaging did not show any emergent cause for your symptoms, but you may require further testing.  Please call your primary care physician to schedule a follow up appointment to discuss your ER visit today. When you schedule this appointment, let them know that you had a CT scan done that showed lesions on your pancreas and liver. Your doctor will likely want to request your records to see the CT scan report.   Return to the Emergency Department if you experience any further chest pain/pressure/tightness, difficulty breathing, sudden sweating, or other symptoms that concern you.

## 2017-11-10 ENCOUNTER — Other Ambulatory Visit: Payer: Self-pay | Admitting: Internal Medicine

## 2017-11-10 DIAGNOSIS — K8689 Other specified diseases of pancreas: Secondary | ICD-10-CM

## 2017-11-12 ENCOUNTER — Other Ambulatory Visit (HOSPITAL_BASED_OUTPATIENT_CLINIC_OR_DEPARTMENT_OTHER): Payer: Self-pay

## 2017-11-12 DIAGNOSIS — R0683 Snoring: Secondary | ICD-10-CM

## 2017-11-12 DIAGNOSIS — R5383 Other fatigue: Secondary | ICD-10-CM

## 2017-11-12 DIAGNOSIS — G4710 Hypersomnia, unspecified: Secondary | ICD-10-CM

## 2017-11-14 ENCOUNTER — Ambulatory Visit (HOSPITAL_BASED_OUTPATIENT_CLINIC_OR_DEPARTMENT_OTHER): Payer: Medicaid Other

## 2017-11-17 ENCOUNTER — Ambulatory Visit (HOSPITAL_BASED_OUTPATIENT_CLINIC_OR_DEPARTMENT_OTHER): Payer: Medicaid Other

## 2017-11-23 ENCOUNTER — Inpatient Hospital Stay
Admission: RE | Admit: 2017-11-23 | Discharge: 2017-11-23 | Disposition: A | Discharge status: Home / Self Care | Payer: Self-pay | Source: Ambulatory Visit | Attending: Internal Medicine | Admitting: Internal Medicine

## 2017-11-29 DIAGNOSIS — E782 Mixed hyperlipidemia: Secondary | ICD-10-CM | POA: Insufficient documentation

## 2017-12-02 ENCOUNTER — Ambulatory Visit (HOSPITAL_BASED_OUTPATIENT_CLINIC_OR_DEPARTMENT_OTHER): Payer: Medicaid Other | Attending: Internal Medicine | Admitting: Internal Medicine

## 2017-12-02 VITALS — Ht 66.0 in | Wt 260.0 lb

## 2017-12-02 DIAGNOSIS — G4733 Obstructive sleep apnea (adult) (pediatric): Secondary | ICD-10-CM

## 2017-12-02 DIAGNOSIS — G4710 Hypersomnia, unspecified: Secondary | ICD-10-CM

## 2017-12-02 DIAGNOSIS — R0683 Snoring: Secondary | ICD-10-CM | POA: Diagnosis present

## 2017-12-02 DIAGNOSIS — R5383 Other fatigue: Secondary | ICD-10-CM | POA: Diagnosis not present

## 2017-12-02 DIAGNOSIS — Z9989 Dependence on other enabling machines and devices: Secondary | ICD-10-CM

## 2017-12-03 ENCOUNTER — Other Ambulatory Visit: Payer: Self-pay | Admitting: Internal Medicine

## 2017-12-03 ENCOUNTER — Ambulatory Visit
Admission: RE | Admit: 2017-12-03 | Discharge: 2017-12-03 | Disposition: A | Discharge status: Home / Self Care | Payer: Medicaid Other | Source: Ambulatory Visit | Attending: Internal Medicine | Admitting: Internal Medicine

## 2017-12-03 DIAGNOSIS — J342 Deviated nasal septum: Secondary | ICD-10-CM | POA: Insufficient documentation

## 2017-12-03 DIAGNOSIS — Z1231 Encounter for screening mammogram for malignant neoplasm of breast: Secondary | ICD-10-CM

## 2017-12-03 DIAGNOSIS — K8689 Other specified diseases of pancreas: Secondary | ICD-10-CM

## 2017-12-03 MED ORDER — GADOBENATE DIMEGLUMINE 529 MG/ML IV SOLN
20.0000 mL | Freq: Once | INTRAVENOUS | Status: AC | PRN
  Administered 2017-12-03: 20 mL via INTRAVENOUS

## 2017-12-12 DIAGNOSIS — R0683 Snoring: Secondary | ICD-10-CM | POA: Diagnosis not present

## 2017-12-12 NOTE — Procedures (Signed)
Patient Name: Kristina Huffman, Saari Date: 12/02/2017 Gender: Female D.O.B: 1960-10-14 Age (years): 56 Referring Provider: Latanya Presser MD Height (inches): 66 Interpreting Physician: Baird Lyons MD, ABSM Weight (lbs): 260 RPSGT: Laren Everts BMI: 42 MRN: [id number] Neck Size: 17.50  CLINICAL INFORMATION Sleep Study Type: Split Night CPAP Indication for sleep study: Diabetes, Hypertension, Obesity, Snoring, Witnessed Apneas  Epworth Sleepiness Score: 12  SLEEP STUDY TECHNIQUE As per the AASM Manual for the Scoring of Sleep and Associated Events v2.3 (April 2016) with a hypopnea requiring 4% desaturations.  The channels recorded and monitored were frontal, central and occipital EEG, electrooculogram (EOG), submentalis EMG (chin), nasal and oral airflow, thoracic and abdominal wall motion, anterior tibialis EMG, snore microphone, electrocardiogram, and pulse oximetry. Continuous positive airway pressure (CPAP) was initiated when the patient met split night criteria and was titrated according to treat sleep-disordered breathing.  MEDICATIONS Medications self-administered by patient taken the night of the study : SEROQUEL, NEURONTIN, PROTONIX  RESPIRATORY PARAMETERS Diagnostic  Total AHI (/hr): 97.1 RDI (/hr): 99.8 OA Index (/hr): 23.1 CA Index (/hr): 0.0 REM AHI (/hr): N/A NREM AHI (/hr): 97.1 Supine AHI (/hr): N/A Non-supine AHI (/hr): 97.11 Min O2 Sat (%): 83.0 Mean O2 (%): 89.4 Time below 88% (min): 46.0   Titration  Optimal Pressure (cm): 10 AHI at Optimal Pressure (/hr): 3.4 Min O2 at Optimal Pressure (%): 85.0 Supine % at Optimal (%): 0 Sleep % at Optimal (%): 98   SLEEP ARCHITECTURE The recording time for the entire night was 453.6 minutes.  During a baseline period of 208.8 minutes, the patient slept for 132.2 minutes in REM and nonREM, yielding a sleep efficiency of 63.3%%. Sleep onset after lights out was 13.1 minutes with a REM latency of N/A minutes. The  patient spent 22.7%% of the night in stage N1 sleep, 77.3%% in stage N2 sleep, 0.0%% in stage N3 and 0.0%% in REM.  During the titration period of 238.2 minutes, the patient slept for 183.5 minutes in REM and nonREM, yielding a sleep efficiency of 77.0%%. Sleep onset after CPAP initiation was 14.7 minutes with a REM latency of 139.0 minutes. The patient spent 3.5%% of the night in stage N1 sleep, 51.8%% in stage N2 sleep, 0.0%% in stage N3 and 44.7%% in REM.  CARDIAC DATA The 2 lead EKG demonstrated sinus rhythm. The mean heart rate was 100.0 beats per minute. Other EKG findings include: None.  LEG MOVEMENT DATA The total Periodic Limb Movements of Sleep (PLMS) were 0. The PLMS index was 0.0 .  IMPRESSIONS - Severe obstructive sleep apnea occurred during the diagnostic portion of the study (AHI = 97.1/hour). An optimal PAP pressure was selected for this patient ( 10 cm of water) - No significant central sleep apnea occurred during the diagnostic portion of the study (CAI = 0.0/hour). - Moderate oxygen desaturation was noted during the diagnostic portion of the study (Min O2 =83.0%). Mean at final CPAP 91.6%. - The patient snored with moderate snoring volume during the diagnostic portion of the study. - No cardiac abnormalities were noted during this study. - Clinically significant periodic limb movements did not occur during sleep.  DIAGNOSIS - Obstructive Sleep Apnea (327.23 [G47.33 ICD-10])  RECOMMENDATIONS - Trial of CPAP therapy on 10 cm H2O with a Medium size Resmed Full Face Mask AirFit F20 mask and heated humidification. - Sleep hygiene should be reviewed to assess factors that may improve sleep quality. - Weight management and regular exercise should be initiated or continued.  [Electronically signed]  12/12/2017 01:18 PM  Baird Lyons MD, [place], American Board of Sleep Medicine   NPI: [id number]                          [place], [facility]  ELECTRONICALLY SIGNED ON:  12/12/2017, 1:17 PM [place] PH: [phone]   FX: [phone] [place]

## 2018-01-28 ENCOUNTER — Ambulatory Visit: Payer: Self-pay

## 2018-02-25 ENCOUNTER — Ambulatory Visit
Admission: RE | Admit: 2018-02-25 | Discharge: 2018-02-25 | Disposition: A | Discharge status: Home / Self Care | Payer: Medicaid Other | Source: Ambulatory Visit | Attending: Internal Medicine | Admitting: Internal Medicine

## 2018-02-25 DIAGNOSIS — Z1231 Encounter for screening mammogram for malignant neoplasm of breast: Secondary | ICD-10-CM

## 2018-03-30 ENCOUNTER — Other Ambulatory Visit: Payer: Self-pay | Admitting: Gastroenterology

## 2018-04-22 ENCOUNTER — Ambulatory Visit: Payer: Self-pay

## 2018-04-29 ENCOUNTER — Ambulatory Visit: Payer: Self-pay

## 2018-05-06 ENCOUNTER — Ambulatory Visit: Payer: Self-pay

## 2018-06-02 ENCOUNTER — Other Ambulatory Visit: Payer: Self-pay | Admitting: Gastroenterology

## 2018-06-03 ENCOUNTER — Ambulatory Visit: Payer: Self-pay

## 2018-06-05 ENCOUNTER — Other Ambulatory Visit: Payer: Self-pay

## 2018-06-05 ENCOUNTER — Emergency Department (HOSPITAL_COMMUNITY)
Admission: EM | Admit: 2018-06-05 | Discharge: 2018-06-06 | Disposition: A | Discharge status: Other Acute Inpatient Hospital | Payer: Medicaid Other | Attending: Emergency Medicine | Admitting: Emergency Medicine

## 2018-06-05 ENCOUNTER — Encounter (HOSPITAL_COMMUNITY): Payer: Self-pay | Admitting: Emergency Medicine

## 2018-06-05 DIAGNOSIS — F1099 Alcohol use, unspecified with unspecified alcohol-induced disorder: Secondary | ICD-10-CM | POA: Insufficient documentation

## 2018-06-05 DIAGNOSIS — J45909 Unspecified asthma, uncomplicated: Secondary | ICD-10-CM | POA: Insufficient documentation

## 2018-06-05 DIAGNOSIS — Z046 Encounter for general psychiatric examination, requested by authority: Secondary | ICD-10-CM | POA: Insufficient documentation

## 2018-06-05 DIAGNOSIS — Y906 Blood alcohol level of 120-199 mg/100 ml: Secondary | ICD-10-CM | POA: Insufficient documentation

## 2018-06-05 DIAGNOSIS — F329 Major depressive disorder, single episode, unspecified: Secondary | ICD-10-CM | POA: Insufficient documentation

## 2018-06-05 DIAGNOSIS — Z79899 Other long term (current) drug therapy: Secondary | ICD-10-CM | POA: Insufficient documentation

## 2018-06-05 DIAGNOSIS — R45851 Suicidal ideations: Secondary | ICD-10-CM | POA: Insufficient documentation

## 2018-06-05 DIAGNOSIS — Z7984 Long term (current) use of oral hypoglycemic drugs: Secondary | ICD-10-CM | POA: Insufficient documentation

## 2018-06-05 DIAGNOSIS — I10 Essential (primary) hypertension: Secondary | ICD-10-CM | POA: Insufficient documentation

## 2018-06-05 DIAGNOSIS — F419 Anxiety disorder, unspecified: Secondary | ICD-10-CM | POA: Insufficient documentation

## 2018-06-05 LAB — COMPREHENSIVE METABOLIC PANEL
ALT: 75 U/L — ABNORMAL HIGH (ref 0–44)
AST: 52 U/L — ABNORMAL HIGH (ref 15–41)
Albumin: 3.9 g/dL (ref 3.5–5.0)
Alkaline Phosphatase: 102 U/L (ref 38–126)
Anion gap: 16 — ABNORMAL HIGH (ref 5–15)
BUN: 8 mg/dL (ref 6–20)
CO2: 20 mmol/L — ABNORMAL LOW (ref 22–32)
Calcium: 9.2 mg/dL (ref 8.9–10.3)
Chloride: 100 mmol/L (ref 98–111)
Creatinine, Ser: 0.82 mg/dL (ref 0.44–1.00)
GFR calc Af Amer: >60 mL/min (ref >60)
GFR calc non Af Amer: >60 mL/min (ref >60)
Glucose, Bld: 135 mg/dL — ABNORMAL HIGH (ref 70–99)
Potassium: 3.7 mmol/L (ref 3.5–5.1)
Sodium: 136 mmol/L (ref 135–145)
Total Bilirubin: 1.1 mg/dL (ref 0.3–1.2)
Total Protein: 6.6 g/dL (ref 6.5–8.1)

## 2018-06-05 LAB — CBC
HCT: 43.5 % (ref 36.0–46.0)
Hemoglobin: 14.2 g/dL (ref 12.0–15.0)
MCH: 31 pg (ref 26.0–34.0)
MCHC: 32.6 g/dL (ref 30.0–36.0)
MCV: 95 fL (ref 78.0–100.0)
Platelets: 257 10*3/uL (ref 150–400)
RBC: 4.58 MIL/uL (ref 3.87–5.11)
RDW: 13.2 % (ref 11.5–15.5)
WBC: 6.4 10*3/uL (ref 4.0–10.5)

## 2018-06-05 LAB — RAPID URINE DRUG SCREEN, HOSP PERFORMED
Amphetamines: NOT DETECTED
Barbiturates: NOT DETECTED
Benzodiazepines: NOT DETECTED
Cocaine: NOT DETECTED
Opiates: NOT DETECTED
Tetrahydrocannabinol: NOT DETECTED

## 2018-06-05 LAB — ETHANOL: Alcohol, Ethyl (B): 166 mg/dL — ABNORMAL HIGH (ref <10)

## 2018-06-05 LAB — I-STAT BETA HCG BLOOD, ED (MC, WL, AP ONLY): I-stat hCG, quantitative: <5 m[IU]/mL (ref <5)

## 2018-06-05 MED ORDER — LISINOPRIL 20 MG PO TABS: 40.0000 mg | ORAL_TABLET | Freq: Every day | ORAL | Status: DC

## 2018-06-05 MED ORDER — METFORMIN HCL 500 MG PO TABS
500.0000 mg | ORAL_TABLET | Freq: Two times a day (BID) | ORAL | Status: DC
  Administered 2018-06-06: 500 mg via ORAL
  Filled 2018-06-05: qty 1

## 2018-06-05 MED ORDER — QUETIAPINE FUMARATE 50 MG PO TABS
100.0000 mg | ORAL_TABLET | Freq: Four times a day (QID) | ORAL | Status: DC
  Administered 2018-06-06: 100 mg via ORAL
  Filled 2018-06-05: qty 2

## 2018-06-05 MED ORDER — LORAZEPAM 1 MG PO TABS
1.0000 mg | ORAL_TABLET | Freq: Once | ORAL | Status: AC
  Administered 2018-06-06: 1 mg via ORAL
  Filled 2018-06-05: qty 1

## 2018-06-05 MED ORDER — LORAZEPAM 1 MG PO TABS
0.0000 mg | ORAL_TABLET | Freq: Four times a day (QID) | ORAL | Status: DC
  Administered 2018-06-06: 2 mg via ORAL
  Filled 2018-06-05: qty 1

## 2018-06-05 MED ORDER — PANTOPRAZOLE SODIUM 40 MG PO TBEC: 40.0000 mg | DELAYED_RELEASE_TABLET | Freq: Every day | ORAL | Status: DC

## 2018-06-05 MED ORDER — LORAZEPAM 1 MG PO TABS: 0.0000 mg | ORAL_TABLET | Freq: Two times a day (BID) | ORAL | Status: DC

## 2018-06-05 MED ORDER — HYDROCHLOROTHIAZIDE 25 MG PO TABS: 25.0000 mg | ORAL_TABLET | ORAL | Status: DC

## 2018-06-05 MED ORDER — ATORVASTATIN CALCIUM 10 MG PO TABS
10.0000 mg | ORAL_TABLET | Freq: Every day | ORAL | Status: DC
  Administered 2018-06-06: 10 mg via ORAL
  Filled 2018-06-05: qty 1

## 2018-06-05 MED ORDER — IBUPROFEN 400 MG PO TABS: 400.0000 mg | ORAL_TABLET | Freq: Four times a day (QID) | ORAL | Status: DC | PRN

## 2018-06-05 MED ORDER — LORAZEPAM 2 MG/ML IJ SOLN: 0.0000 mg | Freq: Two times a day (BID) | INTRAMUSCULAR | Status: DC

## 2018-06-05 MED ORDER — VITAMIN B-1 100 MG PO TABS: 100.0000 mg | ORAL_TABLET | Freq: Every day | ORAL | Status: DC

## 2018-06-05 MED ORDER — FLUOXETINE HCL 20 MG PO CAPS: 40.0000 mg | ORAL_CAPSULE | Freq: Every day | ORAL | Status: DC

## 2018-06-05 MED ORDER — ALBUTEROL SULFATE HFA 108 (90 BASE) MCG/ACT IN AERS: 2.0000 | INHALATION_SPRAY | Freq: Four times a day (QID) | RESPIRATORY_TRACT | Status: DC | PRN

## 2018-06-05 MED ORDER — ONDANSETRON HCL 4 MG PO TABS: 4.0000 mg | ORAL_TABLET | Freq: Four times a day (QID) | ORAL | Status: DC | PRN

## 2018-06-05 MED ORDER — GABAPENTIN 400 MG PO CAPS
400.0000 mg | ORAL_CAPSULE | Freq: Four times a day (QID) | ORAL | Status: DC
  Administered 2018-06-06: 400 mg via ORAL
  Filled 2018-06-05: qty 1

## 2018-06-05 MED ORDER — THIAMINE HCL 100 MG/ML IJ SOLN: 100.0000 mg | Freq: Every day | INTRAMUSCULAR | Status: DC

## 2018-06-05 MED ORDER — LORAZEPAM 2 MG/ML IJ SOLN: 0.0000 mg | Freq: Four times a day (QID) | INTRAMUSCULAR | Status: DC

## 2018-06-05 NOTE — ED Notes (Signed)
Pt changed into paper scrubs. Staffing notified of need for sitter.

## 2018-06-05 NOTE — ED Notes (Signed)
Pt's belongings inventoried and placed in locker #3. Valuables given to security.

## 2018-06-05 NOTE — ED Provider Notes (Signed)
[facility] EMERGENCY DEPARTMENT Provider Note   CSN: [id number] Arrival date & time: 06/05/18  2149     History   Chief Complaint Chief Complaint  Patient presents with  . Suicidal    HPI Kristina Huffman is a 57 y.o. female.  The history is provided by the patient and medical records. No language interpreter was used.   Kristina Huffman is a 57 y.o. female  with a PMH of bipolar disorder, depression, hypertension, asthma, hep C who presents to the Emergency Department complaining of suicidal thoughts.  Patient states that she does not "want to wake up sometimes".  She states that she lost her first born son due to a heroin overdose in 2012 and "has not been able to get over it".  She has been participating in online support group and feels as if that helps.  She does report seeing her primary care doctor who keeps telling her how sorry she is, but that is not helping.  She is on the medication for her bipolar and has been compliant with this. She does endorse daily ETOH use to try to cope with her SI. Typically drinks about a liter of wine a day. Last drink 5pm tonight.   Past Medical History:  Diagnosis Date  . Anxiety   . Asthma   . Depression   . ETOH abuse   . Hepatitis C   . Heroin abuse ([place])   . History of MRSA infection    legs and spread to face  . Hypertension     Patient Active Problem List   Diagnosis Date Noted  . Chronic hepatitis C without hepatic coma ([name]) 06/04/2016  . Substance induced mood disorder ([place]) 09/20/2013  . Alcohol dependency ([place]) 11/11/2011    Class: Acute  . Benzodiazepine dependence ([place]) 09/21/2011  . Bipolar 1 disorder, mixed, moderate ([place]) 09/21/2011  . Complicated bereavement 59/56/3875  . PTSD (post-traumatic stress disorder) 09/21/2011  . Homeless 09/21/2011    Past Surgical History:  Procedure Laterality Date  . BACK SURGERY    . RADIAL HEAD ARTHROPLASTY  08/09/2012   Procedure: RADIAL HEAD ARTHROPLASTY;   Surgeon: Schuyler Amor, MD;  Location: [facility];  Service: Orthopedics;  Laterality: Left;  Left Radial head Replacement     OB History    Gravida  5   Para      Term      Preterm      AB  1   Living        SAB  1   TAB      Ectopic      Multiple      Live Births  4            Home Medications    Prior to Admission medications   Medication Sig Start Date End Date Taking? Authorizing Provider  albuterol (PROVENTIL HFA;VENTOLIN HFA) 108 (90 BASE) MCG/ACT inhaler Inhale 2 puffs into the lungs every 6 (six) hours as needed for wheezing. 09/23/13  Yes Lindell Spar I, NP  atorvastatin (LIPITOR) 10 MG tablet Take 10 mg by mouth at bedtime.   Yes [provider]  FLUoxetine (PROZAC) 40 MG capsule Take 40 mg by mouth every morning. 05/21/18  Yes [provider]  gabapentin (NEURONTIN) 400 MG capsule Take 400 mg by mouth 4 (four) times daily. 05/10/18  Yes [provider]  hydrochlorothiazide (HYDRODIURIL) 25 MG tablet Take 25 mg by mouth every morning. 09/26/17  Yes [provider]  ibuprofen (ADVIL,MOTRIN) 200 MG tablet Take 400 mg by mouth every 6 (six) hours as needed for headache or moderate pain.   Yes [provider]  ketoconazole (NIZORAL) 2 % cream Apply 1 application topically 2 (two) times daily as needed for irritation.  09/30/17  Yes [provider]  metFORMIN (GLUCOPHAGE) 500 MG tablet Take 500 mg by mouth 2 (two) times daily. 05/30/18  Yes [provider]  ondansetron (ZOFRAN) 4 MG tablet Take 4 mg by mouth every 6 (six) hours as needed for nausea/vomiting. 05/26/18  Yes [provider]  pantoprazole (PROTONIX) 40 MG tablet Take 1 tablet (40 mg total) by mouth daily. 05/16/17  Yes Little, Wenda Overland, MD  QUEtiapine (SEROQUEL) 100 MG tablet Take 100 mg by mouth 4 (four) times daily.    Yes [provider]  escitalopram (LEXAPRO) 20 MG tablet Take 1 tablet (20 mg total) by mouth  daily. Patient not taking: Reported on 06/05/2018 05/16/17   Little, Wenda Overland, MD  gabapentin (NEURONTIN) 100 MG capsule Take 100 mg by mouth 4 (four) times daily. 08/24/17   [provider]  lisinopril (PRINIVIL,ZESTRIL) 40 MG tablet Take 1 tablet (40 mg total) by mouth daily. Patient not taking: Reported on 10/12/2017 03/18/17   Clent Demark, [name]  QUEtiapine (SEROQUEL) 300 MG tablet Take 1 tablet (300 mg total) by mouth 2 (two) times daily. Patient not taking: Reported on 10/12/2017 03/18/17   Clent Demark, [name]    Family History Family History  Problem Relation Age of Onset  . Depression Mother   . Osteoporosis Mother   . COPD Mother   . Diabetes Father   . Hypertension Father   . Congestive Heart Failure Father   . Aneurysm Father     Social History Social History   Tobacco Use  . Smoking status: Never Smoker  . Smokeless tobacco: Never Used  Substance Use Topics  . Alcohol use: No    Comment: clean for 1 year and 2 months  . Drug use: No    Comment: no drug use for 3 years     Allergies   Sulfa antibiotics   Review of Systems Review of Systems  Psychiatric/Behavioral: Positive for suicidal ideas. Negative for self-injury.  All other systems reviewed and are negative.    Physical Exam Updated Vital Signs BP 122/83 (BP Location: Right Arm)   Pulse 83   Temp 98 F (36.7 C) (Oral)   Resp 17   LMP 08/02/2013 Comment: irregular  SpO2 100%   Physical Exam  Constitutional: She is oriented to person, place, and time. She appears well-developed and well-nourished. No distress.  HENT:  Head: Normocephalic and atraumatic.  Cardiovascular: Normal rate, regular rhythm and normal heart sounds.  No murmur heard. Pulmonary/Chest: Effort normal and breath sounds normal. No respiratory distress.  Abdominal: Soft. She exhibits no distension. There is no tenderness.  Musculoskeletal: Normal range of motion.  Neurological: She is alert and oriented  to person, place, and time.  Skin: Skin is warm and dry.  Nursing note and vitals reviewed.    ED Treatments / Results  Labs (all labs ordered are listed, but only abnormal results are displayed) Labs Reviewed  COMPREHENSIVE METABOLIC PANEL - Abnormal; Notable for the following components:      Result Value   CO2 20 (*)    Glucose, Bld 135 (*)    AST 52 (*)    ALT 75 (*)    Anion gap 16 (*)  All other components within normal limits  ETHANOL - Abnormal; Notable for the following components:   Alcohol, Ethyl (B) 166 (*)    All other components within normal limits  CBC  RAPID URINE DRUG SCREEN, HOSP PERFORMED  I-STAT BETA HCG BLOOD, ED ([facility], [facility], [facility] ONLY)    EKG None  Radiology No results found.  Procedures Procedures (including critical care time)  Medications Ordered in ED Medications  LORazepam (ATIVAN) injection 0-4 mg (has no administration in time range)    Or  LORazepam (ATIVAN) tablet 0-4 mg (has no administration in time range)  LORazepam (ATIVAN) injection 0-4 mg (has no administration in time range)    Or  LORazepam (ATIVAN) tablet 0-4 mg (has no administration in time range)  thiamine (VITAMIN B-1) tablet 100 mg (has no administration in time range)    Or  thiamine (B-1) injection 100 mg (has no administration in time range)  LORazepam (ATIVAN) tablet 1 mg (has no administration in time range)  albuterol (PROVENTIL HFA;VENTOLIN HFA) 108 (90 Base) MCG/ACT inhaler 2 puff (has no administration in time range)  atorvastatin (LIPITOR) tablet 10 mg (has no administration in time range)  FLUoxetine (PROZAC) capsule 40 mg (has no administration in time range)  gabapentin (NEURONTIN) capsule 400 mg (has no administration in time range)  hydrochlorothiazide (HYDRODIURIL) tablet 25 mg (has no administration in time range)  ibuprofen (ADVIL,MOTRIN) tablet 400 mg (has no administration in time range)  metFORMIN (GLUCOPHAGE) tablet 500 mg (has no administration in  time range)  ondansetron (ZOFRAN) tablet 4 mg (has no administration in time range)  pantoprazole (PROTONIX) EC tablet 40 mg (has no administration in time range)  QUEtiapine (SEROQUEL) tablet 100 mg (has no administration in time range)  lisinopril (PRINIVIL,ZESTRIL) tablet 40 mg (has no administration in time range)     Initial Impression / Assessment and Plan / ED Course  I have reviewed the triage vital signs and the nursing notes.  Pertinent labs & imaging results that were available during my care of the patient were reviewed by me and considered in my medical decision making (see chart for details).    Kristina Huffman is a 57 y.o. female who presents to ED for suicidal thoughts. Normal physical examination. Labs reviewed. ETOH elevated, but otherwise reassuring. No signs of acute withdrawal. Placed on CIWA protocol. Medically cleared with disposition per TTS recommendations.    Final Clinical Impressions(s) / ED Diagnoses   Final diagnoses:  Suicidal thoughts    ED Discharge Orders    None       Gillian Kluever, Ozella Almond, [name] 06/05/18 [place], [place], DO 06/06/18 2346

## 2018-06-05 NOTE — ED Triage Notes (Signed)
Pt states she has feelings of SI.  She drinks 1 liter of wine per day. Last drink 1700 tonight. Feeligs of anxiety. Wishes to be worked up for inpatient placement. Diagnosed bipolar.

## 2018-06-06 ENCOUNTER — Inpatient Hospital Stay (HOSPITAL_COMMUNITY)
Admission: AD | Admit: 2018-06-06 | Discharge: 2018-06-09 | DRG: 897 | Disposition: A | Discharge status: Home / Self Care | Payer: Medicaid Other | Source: Intra-hospital | Attending: Psychiatry | Admitting: Psychiatry

## 2018-06-06 ENCOUNTER — Encounter (HOSPITAL_COMMUNITY): Payer: Self-pay

## 2018-06-06 ENCOUNTER — Other Ambulatory Visit: Payer: Self-pay

## 2018-06-06 DIAGNOSIS — Z8614 Personal history of Methicillin resistant Staphylococcus aureus infection: Secondary | ICD-10-CM

## 2018-06-06 DIAGNOSIS — F419 Anxiety disorder, unspecified: Secondary | ICD-10-CM | POA: Diagnosis not present

## 2018-06-06 DIAGNOSIS — R41843 Psychomotor deficit: Secondary | ICD-10-CM | POA: Diagnosis present

## 2018-06-06 DIAGNOSIS — F39 Unspecified mood [affective] disorder: Secondary | ICD-10-CM | POA: Diagnosis not present

## 2018-06-06 DIAGNOSIS — F10239 Alcohol dependence with withdrawal, unspecified: Principal | ICD-10-CM | POA: Diagnosis present

## 2018-06-06 DIAGNOSIS — Y906 Blood alcohol level of 120-199 mg/100 ml: Secondary | ICD-10-CM | POA: Diagnosis present

## 2018-06-06 DIAGNOSIS — F3162 Bipolar disorder, current episode mixed, moderate: Secondary | ICD-10-CM | POA: Diagnosis present

## 2018-06-06 DIAGNOSIS — G479 Sleep disorder, unspecified: Secondary | ICD-10-CM | POA: Diagnosis present

## 2018-06-06 DIAGNOSIS — Z833 Family history of diabetes mellitus: Secondary | ICD-10-CM

## 2018-06-06 DIAGNOSIS — J45909 Unspecified asthma, uncomplicated: Secondary | ICD-10-CM | POA: Diagnosis present

## 2018-06-06 DIAGNOSIS — B182 Chronic viral hepatitis C: Secondary | ICD-10-CM | POA: Diagnosis present

## 2018-06-06 DIAGNOSIS — I10 Essential (primary) hypertension: Secondary | ICD-10-CM | POA: Diagnosis present

## 2018-06-06 DIAGNOSIS — Z882 Allergy status to sulfonamides status: Secondary | ICD-10-CM

## 2018-06-06 DIAGNOSIS — Z818 Family history of other mental and behavioral disorders: Secondary | ICD-10-CM

## 2018-06-06 DIAGNOSIS — F1024 Alcohol dependence with alcohol-induced mood disorder: Secondary | ICD-10-CM | POA: Diagnosis not present

## 2018-06-06 DIAGNOSIS — F4310 Post-traumatic stress disorder, unspecified: Secondary | ICD-10-CM | POA: Diagnosis present

## 2018-06-06 DIAGNOSIS — E119 Type 2 diabetes mellitus without complications: Secondary | ICD-10-CM | POA: Diagnosis present

## 2018-06-06 DIAGNOSIS — F1020 Alcohol dependence, uncomplicated: Secondary | ICD-10-CM | POA: Diagnosis not present

## 2018-06-06 DIAGNOSIS — Z91410 Personal history of adult physical and sexual abuse: Secondary | ICD-10-CM | POA: Diagnosis not present

## 2018-06-06 DIAGNOSIS — R45851 Suicidal ideations: Secondary | ICD-10-CM | POA: Diagnosis present

## 2018-06-06 DIAGNOSIS — Z79899 Other long term (current) drug therapy: Secondary | ICD-10-CM

## 2018-06-06 HISTORY — DX: Type 2 diabetes mellitus without complications: E11.9

## 2018-06-06 HISTORY — DX: Bipolar disorder, unspecified: F31.9

## 2018-06-06 LAB — GLUCOSE, CAPILLARY
Glucose-Capillary: 118 mg/dL — ABNORMAL HIGH (ref 70–99)
Glucose-Capillary: 93 mg/dL (ref 70–99)

## 2018-06-06 MED ORDER — TRAZODONE HCL 50 MG PO TABS: 50.0000 mg | ORAL_TABLET | Freq: Every evening | ORAL | Status: DC | PRN

## 2018-06-06 MED ORDER — GABAPENTIN 800 MG PO TABS
400.0000 mg | ORAL_TABLET | Freq: Four times a day (QID) | ORAL | Status: DC
  Filled 2018-06-06 (×5): qty 0.5
  Filled 2018-06-06: qty 1

## 2018-06-06 MED ORDER — HYDROCHLOROTHIAZIDE 25 MG PO TABS
25.0000 mg | ORAL_TABLET | Freq: Every day | ORAL | Status: DC
  Administered 2018-06-06 – 2018-06-09 (×3): 25 mg via ORAL
  Filled 2018-06-06 (×7): qty 1

## 2018-06-06 MED ORDER — ALBUTEROL SULFATE HFA 108 (90 BASE) MCG/ACT IN AERS
2.0000 | INHALATION_SPRAY | Freq: Four times a day (QID) | RESPIRATORY_TRACT | Status: DC | PRN
  Administered 2018-06-06: 2 via RESPIRATORY_TRACT
  Filled 2018-06-06: qty 6.7

## 2018-06-06 MED ORDER — VITAMIN B-1 100 MG PO TABS
100.0000 mg | ORAL_TABLET | Freq: Every day | ORAL | Status: DC
  Administered 2018-06-07 – 2018-06-09 (×3): 100 mg via ORAL
  Filled 2018-06-06 (×5): qty 1

## 2018-06-06 MED ORDER — ALBUTEROL SULFATE HFA 108 (90 BASE) MCG/ACT IN AERS: 2.0000 | INHALATION_SPRAY | Freq: Four times a day (QID) | RESPIRATORY_TRACT | Status: DC | PRN

## 2018-06-06 MED ORDER — HYDROXYZINE HCL 25 MG PO TABS: 25.0000 mg | ORAL_TABLET | Freq: Four times a day (QID) | ORAL | Status: DC | PRN

## 2018-06-06 MED ORDER — ATORVASTATIN CALCIUM 10 MG PO TABS
10.0000 mg | ORAL_TABLET | Freq: Every day | ORAL | Status: DC
  Filled 2018-06-06 (×3): qty 1

## 2018-06-06 MED ORDER — HYDROXYZINE HCL 25 MG PO TABS
25.0000 mg | ORAL_TABLET | Freq: Four times a day (QID) | ORAL | Status: AC | PRN
  Administered 2018-06-07 – 2018-06-08 (×2): 25 mg via ORAL
  Filled 2018-06-06 (×2): qty 1

## 2018-06-06 MED ORDER — QUETIAPINE FUMARATE 100 MG PO TABS
100.0000 mg | ORAL_TABLET | Freq: Four times a day (QID) | ORAL | Status: DC
  Administered 2018-06-06 (×3): 100 mg via ORAL
  Filled 2018-06-06 (×10): qty 1

## 2018-06-06 MED ORDER — DOCUSATE SODIUM 100 MG PO CAPS: 100.0000 mg | ORAL_CAPSULE | Freq: Every day | ORAL | Status: DC | PRN

## 2018-06-06 MED ORDER — ATORVASTATIN CALCIUM 10 MG PO TABS
10.0000 mg | ORAL_TABLET | Freq: Every day | ORAL | Status: DC
  Administered 2018-06-06 – 2018-06-08 (×3): 10 mg via ORAL
  Filled 2018-06-06 (×6): qty 1

## 2018-06-06 MED ORDER — METFORMIN HCL 500 MG PO TABS
500.0000 mg | ORAL_TABLET | Freq: Two times a day (BID) | ORAL | Status: DC
  Administered 2018-06-06 – 2018-06-09 (×7): 500 mg via ORAL
  Filled 2018-06-06 (×12): qty 1

## 2018-06-06 MED ORDER — LORAZEPAM 1 MG PO TABS
1.0000 mg | ORAL_TABLET | Freq: Four times a day (QID) | ORAL | Status: AC | PRN
  Administered 2018-06-06: 1 mg via ORAL
  Filled 2018-06-06: qty 1

## 2018-06-06 MED ORDER — FLUOXETINE HCL 20 MG PO CAPS
40.0000 mg | ORAL_CAPSULE | Freq: Every day | ORAL | Status: DC
  Administered 2018-06-06 – 2018-06-09 (×4): 40 mg via ORAL
  Filled 2018-06-06 (×7): qty 2

## 2018-06-06 MED ORDER — ONDANSETRON 4 MG PO TBDP: 4.0000 mg | ORAL_TABLET | Freq: Four times a day (QID) | ORAL | Status: AC | PRN

## 2018-06-06 MED ORDER — QUETIAPINE FUMARATE 200 MG PO TABS
200.0000 mg | ORAL_TABLET | Freq: Two times a day (BID) | ORAL | Status: DC
  Administered 2018-06-06 – 2018-06-09 (×6): 200 mg via ORAL
  Filled 2018-06-06 (×11): qty 1

## 2018-06-06 MED ORDER — GABAPENTIN 400 MG PO CAPS
400.0000 mg | ORAL_CAPSULE | Freq: Four times a day (QID) | ORAL | Status: DC
  Administered 2018-06-06 – 2018-06-09 (×14): 400 mg via ORAL
  Filled 2018-06-06 (×23): qty 1

## 2018-06-06 MED ORDER — PANTOPRAZOLE SODIUM 40 MG PO TBEC
40.0000 mg | DELAYED_RELEASE_TABLET | Freq: Every day | ORAL | Status: DC
  Administered 2018-06-07: 40 mg via ORAL
  Filled 2018-06-06 (×5): qty 1

## 2018-06-06 MED ORDER — ADULT MULTIVITAMIN W/MINERALS CH
1.0000 | ORAL_TABLET | Freq: Every day | ORAL | Status: DC
  Administered 2018-06-06 – 2018-06-08 (×3): 1 via ORAL
  Filled 2018-06-06 (×7): qty 1

## 2018-06-06 MED ORDER — LOPERAMIDE HCL 2 MG PO CAPS: 2.0000 mg | ORAL_CAPSULE | ORAL | Status: AC | PRN

## 2018-06-06 NOTE — Progress Notes (Signed)
Patient ID: Kristina Huffman, female   DOB: 1960-10-06, 57 y.o.   MRN: [id number] [facility] Group Notes:  (Nursing/MHT/Case Management/Adjunct)  Date:  06/06/2018  Time:  3:16 PM  Type of Therapy:  Played a non-competitive learning/communication board game that fosters learning skills as well as self expression.  Participation Level: Did not Attend  Participation Quality: N/A  Affect: N/A  Cognitive:  N/A  Insight:  N/A  Engagement in Group:  N/A Modes of Intervention:  N/A  Summary of Progress/Problems: Did not Attend Melanee Cordial 06/06/2018, 3:16 PM

## 2018-06-06 NOTE — Tx Team (Signed)
Initial Treatment Plan 06/06/2018 3:48 AM [name] PJS:[id number]    PATIENT STRESSORS: Financial difficulties Health problems Marital or family conflict Substance abuse   PATIENT STRENGTHS: Curator fund of knowledge Motivation for treatment/growth Supportive family/friends   PATIENT IDENTIFIED PROBLEMS: Risk for suicide  ETOH  psychosis  depression  "I want to set up IOP, Intensive outpatient"             DISCHARGE CRITERIA:  Ability to meet basic life and health needs Improved stabilization in mood, thinking, and/or behavior Verbal commitment to aftercare and medication compliance  PRELIMINARY DISCHARGE PLAN: Attend aftercare/continuing care group Attend PHP/IOP Participate in family therapy  PATIENT/FAMILY INVOLVEMENT: This treatment plan has been presented to and reviewed with the patient, Kristina Huffman.  The patient and family have been given the opportunity to ask questions and make suggestions.  Providence Crosby, RN 06/06/2018, 3:48 AM

## 2018-06-06 NOTE — Progress Notes (Signed)
  D: Pt passive SI/ AH- contracts for safety. Pt is pleasant and cooperative. Pt stated she was feeling better. Pt stated she was having some withdrawal Sx- Nausea, jitters, anxiety. Pt visible on the unit and in the dayroom this evening. Pt stated she has a Endoscopy and Colonoscopy on Tuesday.  A: Pt was offered support and encouragement. Pt was given scheduled medications. Pt was encourage to attend groups. Q 15 minute checks were done for safety.  R:Pt attends groups and interacts well with peers and staff. Pt is taking medication. Pt has no complaints.Pt receptive to treatment and safety maintained on unit.  Problem: Education: Goal: Emotional status will improve Outcome: Progressing   Problem: Education: Goal: Mental status will improve Outcome: Progressing   Problem: Education: Goal: Verbalization of understanding the information provided will improve Outcome: Progressing   Problem: Activity: Goal: Interest or engagement in activities will improve Outcome: Progressing   Problem: Activity: Goal: Sleeping patterns will improve Outcome: Progressing

## 2018-06-06 NOTE — ED Notes (Signed)
TTS in process 

## 2018-06-06 NOTE — ED Notes (Signed)
Patient asked to have CPAP at night; EDP made aware and placed order-Monique,RN
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
## 2018-06-06 NOTE — [facility] Group Notes (Signed)
Pt was alert and engaged in therapeutic relaxation group.
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
## 2018-06-06 NOTE — BHH Group Notes (Signed)
Adult Psychoeducational Group Note  Date:  06/06/2018 Time:  9:50 AM  Group Topic/Focus:  Goals Group:   The focus of this group is to help patients establish daily goals to achieve during treatment and discuss how the patient can incorporate goal setting into their daily lives to aide in recovery.  Participation Level:  Active  Participation Quality:  Appropriate  Affect:  Appropriate  Cognitive:  Alert  Insight: Appropriate  Engagement in Group:  Engaged  Modes of Intervention:  Orientation  Additional Comments:  Pt attended orientation/goals group  Huel Cote 06/06/2018, 9:50 AM

## 2018-06-06 NOTE — BHH Group Notes (Signed)
[facility] LCSW Group Therapy Note  Date/Time:  06/06/2018 10:00-11:00AM  Type of Therapy and Topic:  Group Therapy:  Healthy and Unhealthy Supports  Participation Level:  Did Not Attend   Description of Group:  Patients in this group were introduced to the idea of adding a variety of healthy supports to address the various needs in their lives.Patients discussed what additional healthy supports could be helpful in their recovery and wellness after discharge in order to prevent future hospitalizations.   An emphasis was placed on using counselor, doctor, therapy groups, 12-step groups, and problem-specific support groups to expand supports.  They also worked as a group on developing a specific plan for several patients to deal with unhealthy supports through [place], psychoeducation with loved ones, and even termination of relationships.   Therapeutic Goals:   1)  discuss importance of adding supports to stay well once out of the hospital  2)  compare healthy versus unhealthy supports and identify some examples of each  3)  generate ideas and descriptions of healthy supports that can be added  4)  offer mutual support about how to address unhealthy supports  5)  encourage active participation in and adherence to discharge plan    Summary of Patient Progress:  Did not attend Therapeutic Modalities:   Motivational Interviewing Brief Solution-Focused Therapy  Rolanda Jay

## 2018-06-06 NOTE — ED Notes (Signed)
Voluntary consent form  Faxed to [facility]

## 2018-06-06 NOTE — Progress Notes (Signed)
Patient ID: Kristina Huffman, female   DOB: 1961/01/07, 57 y.o.   MRN: [id number]  Admission Note:  D: 57 yr female who presents VC in no acute distress for the treatment of SI , ETOH and Depression. Pt appears flat and depressed. Pt was calm and cooperative with admission process. Pt presents with passive SI/ AVH and contracts for safety upon admission. Pt denies HI / pain at this time. Pt stated she have been having increased stress since her son OD on Fentanyl in 2012. Her cousin has custody of her youngest daughter and she doesn't see her like she would like. Pt stated her drinking has increased substantially since 2017 due to her stressors and her depression. Pt stated " I have overwhelming feeling of impending doom, I go through a rough detox". Pt stated she wants to set up IOP and would like to get her drinking under control.     A: Skin was assessed and found to be clear of any abnormal marks per [employer]. PT searched and no contraband found, POC and unit policies explained and understanding verbalized. Consents obtained. Food and fluids offered, and accepted.   R:Pt had no additional questions or concerns.

## 2018-06-06 NOTE — Progress Notes (Signed)
D. Pt presents with an anxious affect depressed mood- friendly, but somewhat guarded during interactions. Pt stated she was 'very shaky inside' this morning and stayed in bed sleeping until after lunch.  Pt visible on the unit later in the afternoon- attending group. Per pt's self inventory, pt rates her depression, hopelessness and anxiety a 05/30/09, respectively. Pt currently denies SI/HI and AVH and agrees to contact staff before acting on any harmful thoughts.  A. Labs and vitals monitored. Pt compliant with medications. Pt supported emotionally and encouraged to express concerns and ask questions.   R. Pt remains safe with 15 minute checks. Will continue POC.
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
## 2018-06-07 LAB — GLUCOSE, CAPILLARY
Glucose-Capillary: 107 mg/dL — ABNORMAL HIGH (ref 70–99)
Glucose-Capillary: 108 mg/dL — ABNORMAL HIGH (ref 70–99)

## 2018-06-07 MED ORDER — DOCUSATE SODIUM 100 MG PO CAPS
100.0000 mg | ORAL_CAPSULE | Freq: Every day | ORAL | Status: DC
  Administered 2018-06-07 – 2018-06-08 (×2): 100 mg via ORAL
  Filled 2018-06-07 (×6): qty 1

## 2018-06-07 MED ORDER — HYDROXYZINE HCL 50 MG PO TABS
50.0000 mg | ORAL_TABLET | Freq: Once | ORAL | Status: AC
  Administered 2018-06-07: 50 mg via ORAL
  Filled 2018-06-07: qty 1

## 2018-06-07 NOTE — [facility] Group Notes (Signed)
LCSW Group Therapy Note  06/07/2018 1:15pm  Type of Therapy and Topic:  Group Therapy: Avoiding Self-Sabotaging and Enabling Behaviors  Participation Level:  Did Not Attend--pt left group soon after it began and did not return.    Description of Group:   In this group, patients will learn how to identify obstacles, self-sabotaging and enabling behaviors, as well as: what are they, why do we do them and what needs these behaviors meet. Discuss unhealthy relationships and how to have positive healthy boundaries with those that sabotage and enable. Explore aspects of self-sabotage and enabling in yourself and how to limit these self-destructive behaviors in everyday life.   Therapeutic Goals: 1. Patient will identify one obstacle that relates to self-sabotage and enabling behaviors 2. Patient will identify one personal self-sabotaging or enabling behavior they did prior to admission 3. Patient will state a plan to change the above identified behavior 4. Patient will demonstrate ability to communicate their needs through discussion and/or role play.   Summary of Patient Progress:  x   Therapeutic Modalities:   Cognitive Behavioral Therapy Person-Centered Therapy Motivational Interviewing   Avelina Laine, LCSW 06/07/2018 11:11 AM

## 2018-06-07 NOTE — Progress Notes (Signed)
Patient did attend the evening speaker AA meeting.  

## 2018-06-07 NOTE — [facility] Suicide Risk Assessment (Signed)
[facility] INPATIENT:  Family/Significant Other Suicide Prevention Education  Suicide Prevention Education:  Patient Refusal for Family/Significant Other Suicide Prevention Education: The patient Kristina Huffman has refused to provide written consent for family/significant other to be provided Family/Significant Other Suicide Prevention Education during admission and/or prior to discharge.  Physician notified.  SPE completed with pt, as pt refused to consent to family contact. SPI pamphlet provided to pt and pt was encouraged to share information with support network, ask questions, and talk about any concerns relating to SPE. Pt denies access to guns/firearms and verbalized understanding of information provided. Mobile Crisis information also provided to pt.  *pt rescinded consent to speak with her husband.   Avelina Laine LCSW 06/07/2018, 3:01 PM

## 2018-06-07 NOTE — Plan of Care (Signed)
Problem: Safety: Goal: Periods of time without injury will increase Outcome: Progressing   Problem: Nutritional: Goal: Ability to achieve adequate nutritional intake will improve Outcome: Progressing D: Pt visible in milieu at intervals during shift. A & O X4. Denies SI, HI, AVH and pain when assessed "I'm ok right now, just need to take care of my endoscopy appointment". Presents irritable on approach, demanding to "schedule my own endoscopy appointment, this is my second time rescheduling it and I prefer to do it myself". Reports she slept good last night, with fair appetite, normal energy and good concentration level. Rates her depression 3/10, hopelessness 0/10 and anxiety 2/10. Pt's goal this shift "stay positive". Pt did not attend scheduled groups on unit despite multiple prompts, walked out of second group when it started and did not return. Compliant with medications when offered. Denies adverse drug reactions.   A: Scheduled medications administered as ordered with verbal education and effects monitored. Emotional support and availability provided to pt as needed. Encouraged pt to voice concerns, attend to ADLs and comply with current treatment regimen including groups. Safety checks maintained without self harm gestures or outburst thus far. R: Tolerated all PO intake well. Remains safe on and off unit. POC continues for safety and mood stability.

## 2018-06-07 NOTE — Tx Team (Signed)
Interdisciplinary Treatment and Diagnostic Plan Update  06/07/2018 Time of Session: [id number] Emmakate Hypes MRN: [id number]  Principal Diagnosis: Bipolar Disorder  Secondary Diagnoses: Active Problems:   Alcohol use disorder, severe, dependence (HCC)   Current Medications:  Current Facility-Administered Medications  Medication Dose Route Frequency Provider Last Rate Last Dose  . albuterol (PROVENTIL HFA;VENTOLIN HFA) 108 (90 Base) MCG/ACT inhaler 2 puff  2 puff Inhalation Q6H PRN Patriciaann Clan E, [name]   2 puff at 06/06/18 2318  . atorvastatin (LIPITOR) tablet 10 mg  10 mg Oral QHS Laverle Hobby, [name]   10 mg at 06/06/18 2155  . docusate sodium (COLACE) capsule 100 mg  100 mg Oral Daily PRN Laverle Hobby, [name]      . FLUoxetine (PROZAC) capsule 40 mg  40 mg Oral Daily Sharma Covert, MD   40 mg at 06/07/18 0802  . gabapentin (NEURONTIN) capsule 400 mg  400 mg Oral QID Cobos, Myer Peer, MD   400 mg at 06/07/18 0802  . hydrochlorothiazide (HYDRODIURIL) tablet 25 mg  25 mg Oral Daily Sharma Covert, MD   25 mg at 06/07/18 0802  . hydrOXYzine (ATARAX/VISTARIL) tablet 25 mg  25 mg Oral Q6H PRN Cobos, Fernando A, MD      . loperamide (IMODIUM) capsule 2-4 mg  2-4 mg Oral PRN Cobos, Myer Peer, MD      . LORazepam (ATIVAN) tablet 1 mg  1 mg Oral Q6H PRN Cobos, Myer Peer, MD   1 mg at 06/06/18 1204  . metFORMIN (GLUCOPHAGE) tablet 500 mg  500 mg Oral BID WC Sharma Covert, MD   500 mg at 06/07/18 0802  . multivitamin with minerals tablet 1 tablet  1 tablet Oral Daily Cobos, Myer Peer, MD   1 tablet at 06/07/18 0803  . ondansetron (ZOFRAN-ODT) disintegrating tablet 4 mg  4 mg Oral Q6H PRN Cobos, Myer Peer, MD      . pantoprazole (PROTONIX) EC tablet 40 mg  40 mg Oral Daily Patriciaann Clan E, [name]   40 mg at 06/07/18 0803  . QUEtiapine (SEROQUEL) tablet 200 mg  200 mg Oral BID Laverle Hobby, [name]   200 mg at 06/07/18 5643  . thiamine (VITAMIN B-1) tablet 100 mg  100 mg Oral  Daily Cobos, Myer Peer, MD   100 mg at 06/07/18 0803  . traZODone (DESYREL) tablet 50 mg  50 mg Oral QHS PRN Cobos, Myer Peer, MD       PTA Medications: Medications Prior to Admission  Medication Sig Dispense Refill Last Dose  . albuterol (PROVENTIL HFA;VENTOLIN HFA) 108 (90 BASE) MCG/ACT inhaler Inhale 2 puffs into the lungs every 6 (six) hours as needed for wheezing. 1 Inhaler 0 06/05/2018 at Unknown time  . atorvastatin (LIPITOR) 10 MG tablet Take 10 mg by mouth at bedtime.   06/04/2018 at Unknown time  . escitalopram (LEXAPRO) 20 MG tablet Take 1 tablet (20 mg total) by mouth daily. (Patient not taking: Reported on 06/05/2018) 30 tablet 0 Not Taking at Unknown time  . FLUoxetine (PROZAC) 40 MG capsule Take 40 mg by mouth every morning.  2 06/05/2018 at Unknown time  . gabapentin (NEURONTIN) 100 MG capsule Take 100 mg by mouth 4 (four) times daily.  0 10/11/2017 at Unknown time  . gabapentin (NEURONTIN) 400 MG capsule Take 400 mg by mouth 4 (four) times daily.  1 06/05/2018 at 3 doses  . hydrochlorothiazide (HYDRODIURIL) 25 MG tablet Take 25 mg by mouth every morning.  06/05/2018 at Unknown time  . ibuprofen (ADVIL,MOTRIN) 200 MG tablet Take 400 mg by mouth every 6 (six) hours as needed for headache or moderate pain.   unk  . ketoconazole (NIZORAL) 2 % cream Apply 1 application topically 2 (two) times daily as needed for irritation.   0 Past Month at Unknown time  . lisinopril (PRINIVIL,ZESTRIL) 40 MG tablet Take 1 tablet (40 mg total) by mouth daily. (Patient not taking: Reported on 10/12/2017) 90 tablet 3 Not Taking at Unknown time  . metFORMIN (GLUCOPHAGE) 500 MG tablet Take 500 mg by mouth 2 (two) times daily.  3 06/05/2018 at AM  . ondansetron (ZOFRAN) 4 MG tablet Take 4 mg by mouth every 6 (six) hours as needed for nausea/vomiting.  0 Past Week at Unknown time  . pantoprazole (PROTONIX) 40 MG tablet Take 1 tablet (40 mg total) by mouth daily. 30 tablet 0 06/05/2018 at Unknown time  . QUEtiapine  (SEROQUEL) 100 MG tablet Take 100 mg by mouth 4 (four) times daily.    06/05/2018 at 3 doses  . QUEtiapine (SEROQUEL) 300 MG tablet Take 1 tablet (300 mg total) by mouth 2 (two) times daily. (Patient not taking: Reported on 10/12/2017) 60 tablet 3 Not Taking at Unknown time    Patient Stressors: Financial difficulties Health problems Marital or family conflict Substance abuse  Patient Strengths: Curator fund of knowledge Motivation for treatment/growth Supportive family/friends  Treatment Modalities: Medication Management, Group therapy, Case management,  1 to 1 session with clinician, Psychoeducation, Recreational therapy.   Physician Treatment Plan for Primary Diagnosis: Bipolar Disorder Long Term Goal(s): Improvement in symptoms so as ready for discharge Improvement in symptoms so as ready for discharge   Short Term Goals: Ability to identify changes in lifestyle to reduce recurrence of condition will improve Ability to verbalize feelings will improve Ability to disclose and discuss suicidal ideas Ability to demonstrate self-control will improve Ability to identify and develop effective coping behaviors will improve Ability to maintain clinical measurements within normal limits will improve Ability to identify triggers associated with substance abuse/mental health issues will improve Ability to identify changes in lifestyle to reduce recurrence of condition will improve Ability to verbalize feelings will improve Ability to disclose and discuss suicidal ideas Ability to demonstrate self-control will improve Ability to identify and develop effective coping behaviors will improve Ability to maintain clinical measurements within normal limits will improve Ability to identify triggers associated with substance abuse/mental health issues will improve  Medication Management: Evaluate patient's response, side effects, and tolerance of medication  regimen.  Therapeutic Interventions: 1 to 1 sessions, Unit Group sessions and Medication administration.  Evaluation of Outcomes: Progressing  Physician Treatment Plan for Secondary Diagnosis: Active Problems:   Alcohol use disorder, severe, dependence ([place])  Long Term Goal(s): Improvement in symptoms so as ready for discharge Improvement in symptoms so as ready for discharge   Short Term Goals: Ability to identify changes in lifestyle to reduce recurrence of condition will improve Ability to verbalize feelings will improve Ability to disclose and discuss suicidal ideas Ability to demonstrate self-control will improve Ability to identify and develop effective coping behaviors will improve Ability to maintain clinical measurements within normal limits will improve Ability to identify triggers associated with substance abuse/mental health issues will improve Ability to identify changes in lifestyle to reduce recurrence of condition will improve Ability to verbalize feelings will improve Ability to disclose and discuss suicidal ideas Ability to demonstrate self-control will improve Ability to identify and develop  effective coping behaviors will improve Ability to maintain clinical measurements within normal limits will improve Ability to identify triggers associated with substance abuse/mental health issues will improve     Medication Management: Evaluate patient's response, side effects, and tolerance of medication regimen.  Therapeutic Interventions: 1 to 1 sessions, Unit Group sessions and Medication administration.  Evaluation of Outcomes: Progressing   RN Treatment Plan for Primary Diagnosis: Bipolar Disorder Long Term Goal(s): Knowledge of disease and therapeutic regimen to maintain health will improve  Short Term Goals: Ability to remain free from injury will improve, Ability to verbalize frustration and anger appropriately will improve, Ability to disclose and discuss  suicidal ideas and Ability to identify and develop effective coping behaviors will improve  Medication Management: RN will administer medications as ordered by provider, will assess and evaluate patient's response and provide education to patient for prescribed medication. RN will report any adverse and/or side effects to prescribing provider.  Therapeutic Interventions: 1 on 1 counseling sessions, Psychoeducation, Medication administration, Evaluate responses to treatment, Monitor vital signs and CBGs as ordered, Perform/monitor CIWA, COWS, AIMS and Fall Risk screenings as ordered, Perform wound care treatments as ordered.  Evaluation of Outcomes: Progressing   LCSW Treatment Plan for Primary Diagnosis: Bipolar Disorder Long Term Goal(s): Safe transition to appropriate next level of care at discharge, Engage patient in therapeutic group addressing interpersonal concerns.  Short Term Goals: Engage patient in aftercare planning with referrals and resources, Facilitate patient progression through stages of change regarding substance use diagnoses and concerns and Identify triggers associated with mental health/substance abuse issues  Therapeutic Interventions: Assess for all discharge needs, 1 to 1 time with Social worker, Explore available resources and support systems, Assess for adequacy in community support network, Educate family and significant other(s) on suicide prevention, Complete Psychosocial Assessment, Interpersonal group therapy.  Evaluation of Outcomes: Progressing   Progress in Treatment: Attending groups: Yes. Participating in groups: Yes. Taking medication as prescribed: Yes. Toleration medication: Yes. Family/Significant other contact made: No, will contact:  pt's husband for collateral information/SPE Patient understands diagnosis: Yes. Discussing patient identified problems/goals with staff: Yes. Medical problems stabilized or resolved: Yes. Denies suicidal/homicidal  ideation: Yes. Issues/concerns per patient self-inventory: No. Other: n/a   New problem(s) identified: No, Describe:  n/a  New Short Term/Long Term Goal(s): detox, medication management for mood stabilization; elimination of SI thoughts; development of comprehensive mental wellness/sobriety plan.   Patient Goals:  "I want to get into IOP and get help for my drinking."   Discharge Plan or Barriers: CSW assessing for appropriate referrals. Pt currently resides with her husband and goes to [facility] for medication management. Pt interested in [facility] referral. [facility] pamphlet, Mobile Crisis information, and AA/NA information provided to patient for additional community support and resources.   Reason for Continuation of Hospitalization: Anxiety Depression Medication stabilization Withdrawal symptoms  Estimated Length of Stay: Wed, 06/09/18  Attendees: Patient: 06/07/2018 8:55 AM  Physician: Dr. Mallie Darting MD; Dr. Nancy Fetter MD 06/07/2018 8:55 AM  Nursing: Rise Paganini RN; Olivette RN 06/07/2018 8:55 AM  RN Care Manager:x 06/07/2018 8:55 AM  Social Worker: Janice Norrie LCSW 06/07/2018 8:55 AM  Recreational Therapist: x 06/07/2018 8:55 AM  Other: Ricky Ala NP; Darnelle Maffucci Money NP 06/07/2018 8:55 AM  Other:  06/07/2018 8:55 AM  Other: 06/07/2018 8:55 AM    Scribe for Treatment Team: Avelina Laine, LCSW 06/07/2018 8:55 AM

## 2018-06-07 NOTE — Progress Notes (Signed)
[facility] MD Progress Note  06/07/2018 2:42 PM Kennadie Brenner  MRN:  [id number] Subjective: Patient is seen and examined.  Patient is a 57 year old female with a past psychiatric history significant for alcohol dependence and bipolar disorder.  She is seen in follow-up.  She is significantly better today.  She is much less irritable, her speech is not slurred, and her grooming has significantly improved.  She stated she is not currently having any alcohol withdrawal symptoms.  She stated the third day is always her worst day.  She continues on her fluoxetine, gabapentin and Seroquel.  Vital signs are stable and she is afebrile.  She denied any suicidal ideation today. Principal Problem: <principal problem not specified> Diagnosis:   Patient Active Problem List   Diagnosis Date Noted  . Alcohol use disorder, severe, dependence ([place]) [F10.20] 06/06/2018  . Chronic hepatitis C without hepatic coma ([place]) [B18.2] 06/04/2016  . Substance induced mood disorder ([place]) [F19.94] 09/20/2013  . Alcohol dependency ([place]) [F10.20] 11/11/2011    Class: Acute  . Benzodiazepine dependence ([place]) [F13.20] 09/21/2011  . Bipolar 1 disorder, mixed, moderate ([place]) [F31.62] 09/21/2011  . 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 PTSD (post-traumatic stress disorder) [F43.10] 09/21/2011  . Homeless [Z59.0] 09/21/2011   Total Time spent with patient: 15 minutes  Past Psychiatric History: See admission H&P  Past Medical History:  Past Medical History:  Diagnosis Date  . Anxiety   . Asthma   . Bipolar disorder ([place])   . Depression   . Diabetes mellitus without complication ([place])   . ETOH abuse   . Hepatitis C   . Heroin abuse ([place])   . History of MRSA infection    legs and spread to face  . Hypertension     Past Surgical History:  Procedure Laterality Date  . BACK SURGERY    . RADIAL HEAD ARTHROPLASTY  08/09/2012   Procedure: RADIAL HEAD ARTHROPLASTY;  Surgeon: Schuyler Amor, MD;  Location:  [facility];  Service: Orthopedics;  Laterality: Left;  Left Radial head Replacement   Family History:  Family History  Problem Relation Age of Onset  . Depression Mother   . Osteoporosis Mother   . COPD Mother   . Diabetes Father   . Hypertension Father   . Congestive Heart Failure Father   . Aneurysm Father    Family Psychiatric  History: See admission H&P Social History:  Social History   Substance and Sexual Activity  Alcohol Use Yes   Comment: clean for 1 year and 2 months     Social History   Substance and Sexual Activity  Drug Use No   Comment: no drug use for 3 years    Social History   Socioeconomic History  . Marital status: Married    Spouse name: Not on file  . Number of children: Not on file  . Years of education: Not on file  . Highest education level: Not on file  Occupational History  . Not on file  Social Needs  . Financial resource strain: Not on file  . Food insecurity:    Worry: Not on file    Inability: Not on file  . Transportation needs:    Medical: Not on file    Non-medical: Not on file  Tobacco Use  . Smoking status: Never Smoker  . Smokeless tobacco: Never Used  Substance and Sexual Activity  . Alcohol use: Yes    Comment: clean for 1 year and 2 months  . Drug use:  No    Comment: no drug use for 3 years  . Sexual activity: Yes    Birth control/protection: None  Lifestyle  . Physical activity:    Days per week: Not on file    Minutes per session: Not on file  . Stress: Not on file  Relationships  . Social connections:    Talks on phone: Not on file    Gets together: Not on file    Attends religious service: Not on file    Active member of club or organization: Not on file    Attends meetings of clubs or organizations: Not on file    Relationship status: Not on file  Other Topics Concern  . Not on file  Social History Narrative  . Not on file   Additional Social History:                         Sleep:  Fair  Appetite:  Good  Current Medications: Current Facility-Administered Medications  Medication Dose Route Frequency Provider Last Rate Last Dose  . albuterol (PROVENTIL HFA;VENTOLIN HFA) 108 (90 Base) MCG/ACT inhaler 2 puff  2 puff Inhalation Q6H PRN Patriciaann Clan E, [name]   2 puff at 06/06/18 2318  . atorvastatin (LIPITOR) tablet 10 mg  10 mg Oral QHS Laverle Hobby, [name]   10 mg at 06/06/18 2155  . docusate sodium (COLACE) capsule 100 mg  100 mg Oral Daily PRN Patriciaann Clan E, [name]      . docusate sodium (COLACE) capsule 100 mg  100 mg Oral QHS Sharma Covert, MD      . FLUoxetine (PROZAC) capsule 40 mg  40 mg Oral Daily Sharma Covert, MD   40 mg at 06/07/18 0802  . gabapentin (NEURONTIN) capsule 400 mg  400 mg Oral QID Cobos, Myer Peer, MD   400 mg at 06/07/18 1209  . hydrochlorothiazide (HYDRODIURIL) tablet 25 mg  25 mg Oral Daily Sharma Covert, MD   25 mg at 06/07/18 0802  . hydrOXYzine (ATARAX/VISTARIL) tablet 25 mg  25 mg Oral Q6H PRN Cobos, Fernando A, MD      . loperamide (IMODIUM) capsule 2-4 mg  2-4 mg Oral PRN Cobos, Myer Peer, MD      . LORazepam (ATIVAN) tablet 1 mg  1 mg Oral Q6H PRN Cobos, Myer Peer, MD   1 mg at 06/06/18 1204  . metFORMIN (GLUCOPHAGE) tablet 500 mg  500 mg Oral BID WC Sharma Covert, MD   500 mg at 06/07/18 0802  . multivitamin with minerals tablet 1 tablet  1 tablet Oral Daily Cobos, Myer Peer, MD   1 tablet at 06/07/18 0803  . ondansetron (ZOFRAN-ODT) disintegrating tablet 4 mg  4 mg Oral Q6H PRN Cobos, Myer Peer, MD      . pantoprazole (PROTONIX) EC tablet 40 mg  40 mg Oral Daily Patriciaann Clan E, [name]   40 mg at 06/07/18 0803  . QUEtiapine (SEROQUEL) tablet 200 mg  200 mg Oral BID Laverle Hobby, [name]   200 mg at 06/07/18 0254  . thiamine (VITAMIN B-1) tablet 100 mg  100 mg Oral Daily Cobos, Myer Peer, MD   100 mg at 06/07/18 0803  . traZODone (DESYREL) tablet 50 mg  50 mg Oral QHS PRN Cobos, Myer Peer, MD        Lab  Results:  Results for orders placed or performed during the hospital encounter of 06/06/18 (from the past  48 hour(s))  Glucose, capillary     Status: Abnormal   Collection Time: 06/06/18  3:03 AM  Result Value Ref Range   Glucose-Capillary 118 (H) 70 - 99 mg/dL  Glucose, capillary     Status: None   Collection Time: 06/06/18  6:10 AM  Result Value Ref Range   Glucose-Capillary 93 70 - 99 mg/dL   Comment 1 Notify RN    Comment 2 Document in Chart   Glucose, capillary     Status: Abnormal   Collection Time: 06/07/18  6:00 AM  Result Value Ref Range   Glucose-Capillary 107 (H) 70 - 99 mg/dL   Comment 1 Notify RN    Comment 2 Document in Chart     Blood Alcohol level:  Lab Results  Component Value Date   ETH 166 (H) 06/05/2018   ETH 102 (H) 79/89/2119    Metabolic Disorder Labs: No results found for: HGBA1C, MPG No results found for: PROLACTIN No results found for: CHOL, TRIG, HDL, CHOLHDL, VLDL, LDLCALC  Physical Findings: AIMS: Facial and Oral Movements Muscles of Facial Expression: None, normal Lips and Perioral Area: None, normal Jaw: None, normal Tongue: None, normal,Extremity Movements Upper (arms, wrists, hands, fingers): None, normal Lower (legs, knees, ankles, toes): None, normal, Trunk Movements Neck, shoulders, hips: None, normal, Overall Severity Severity of abnormal movements (highest score from questions above): None, normal Incapacitation due to abnormal movements: None, normal Patient's awareness of abnormal movements (rate only patient's report): No Awareness, Dental Status Current problems with teeth and/or dentures?: No Does patient usually wear dentures?: No  CIWA:  CIWA-Ar Total: 4 COWS:  COWS Total Score: 0  Musculoskeletal: Strength & Muscle Tone: within normal limits Gait & Station: normal Patient leans: N/A  Psychiatric Specialty Exam: Physical Exam  Nursing note and vitals reviewed. Constitutional: She is oriented to person, place, and  time. She appears well-developed and well-nourished.  HENT:  Head: Atraumatic.  Respiratory: Effort normal.  Neurological: She is alert and oriented to person, place, and time.    ROS  Blood pressure 111/71, pulse 66, temperature 98.5 F (36.9 C), temperature source Oral, resp. rate 16, height 5\' 6"  (1.676 m), weight 115.3 kg, last menstrual period 08/02/2013.Body mass index is 41.05 kg/m.  General Appearance: Casual  Eye Contact:  Fair  Speech:  Normal Rate  Volume:  Normal  Mood:  Anxious  Affect:  Congruent  Thought Process:  Coherent and Descriptions of Associations: Intact  Orientation:  Full (Time, Place, and Person)  Thought Content:  Logical  Suicidal Thoughts:  No  Homicidal Thoughts:  No  Memory:  Immediate;   Fair Recent;   Fair Remote;   Fair  Judgement:  Intact  Insight:  Fair  Psychomotor Activity:  Increased  Concentration:  Concentration: Fair and Attention Span: Fair  Recall:  [employer] of Knowledge:  Fair  Language:  Fair  Akathisia:  Yes  Handed:  Right  AIMS (if indicated):     Assets:  Communication Skills Desire for Improvement Financial Resources/Insurance Housing Physical Health Resilience Social Support  ADL's:  Intact  Cognition:  WNL  Sleep:  Number of Hours: 4.5     Treatment Plan Summary: Daily contact with patient to assess and evaluate symptoms and progress in treatment, Medication management and Plan : Patient is seen and examined.  Patient is a 57 year old female with the above-stated past psychiatric history who is seen in follow-up.  #1 alcohol dependence/alcohol withdrawal-she is doing well.  Vital signs are stable,  she is afebrile.  She continues on CIWA > 10 trigger treatment.  No evidence of severe withdrawal at this point.  She stated her worst day is always her third day without alcohol.  We will continue to monitor this.  #2 bipolar disorder-her mood and affect are pretty stable today.  She requested no change in her  psychiatric medications on admission, now that she is coming off the alcohol she looks much better.  No change in her current medications.  #3 diabetes mellitus type 2-she continues on metformin.  #4 position-initially yesterday she was interested in rehabilitation, but today states that when she is through the withdrawal symptoms she just like to return home.  Sharma Covert, MD 06/07/2018, 2:42 PM

## 2018-06-07 NOTE — Progress Notes (Signed)
Recreation Therapy Notes  Date: 9.16.19 Time: 0930 Location: [address] Dayroom  Group Topic: Stress Management  Goal Area(s) Addresses:  Patient will verbalize importance of using healthy stress management.  Patient will identify positive emotions associated with healthy stress management.   Intervention: Stress Management  Activity : Guided Imagery.  LRT introduced the stress management technique of guided imagery.  LRT read a script for a forest meditation.  Patients were to follow along as the script was read to engage in the activity.  Education:  Stress Management, Discharge Planning.   Education Outcome: Acknowledges edcuation/In group clarification offered/Needs additional education  Clinical Observations/Feedback: Pt did not attend group.    Victorino Sparrow, LRT/CTRS     Victorino Sparrow A 06/07/2018 12:11 PM

## 2018-06-07 NOTE — Progress Notes (Signed)
Pt came to the nursing station demanding Ativan, pt was asked to tell what kind of Sx she was exhibiting, pt stated "I feel rotten". Pt was told to tell writer how she was feeling. Pt CIWA was 7 at thiat time, pt was offered a Vistaril. Pt said she would take it and after getting to the medication window, pt asked why she could not get the Ativan. Pt walked away from the med window stating" I don't want it". Pt came back later requesting how to sign herself out, 72 hr request for D/C was explained to pt. After talking to writer pt decided to take the Vistaril.

## 2018-06-08 ENCOUNTER — Encounter (HOSPITAL_COMMUNITY): Payer: Self-pay

## 2018-06-08 ENCOUNTER — Ambulatory Visit (HOSPITAL_COMMUNITY): Admit: 2018-06-08 | Payer: Medicaid Other | Admitting: Gastroenterology

## 2018-06-08 DIAGNOSIS — F1024 Alcohol dependence with alcohol-induced mood disorder: Secondary | ICD-10-CM

## 2018-06-08 DIAGNOSIS — F3162 Bipolar disorder, current episode mixed, moderate: Secondary | ICD-10-CM

## 2018-06-08 DIAGNOSIS — F39 Unspecified mood [affective] disorder: Secondary | ICD-10-CM

## 2018-06-08 DIAGNOSIS — F419 Anxiety disorder, unspecified: Secondary | ICD-10-CM

## 2018-06-08 LAB — HEPATIC FUNCTION PANEL
ALT: 55 U/L — ABNORMAL HIGH (ref 0–44)
AST: 38 U/L (ref 15–41)
Albumin: 4.2 g/dL (ref 3.5–5.0)
Alkaline Phosphatase: 85 U/L (ref 38–126)
Bilirubin, Direct: 0.2 mg/dL (ref 0.0–0.2)
Indirect Bilirubin: 0.9 mg/dL (ref 0.3–0.9)
Total Bilirubin: 1.1 mg/dL (ref 0.3–1.2)
Total Protein: 7 g/dL (ref 6.5–8.1)

## 2018-06-08 LAB — GLUCOSE, CAPILLARY: Glucose-Capillary: 118 mg/dL — ABNORMAL HIGH (ref 70–99)

## 2018-06-08 SURGERY — COLONOSCOPY WITH PROPOFOL
Anesthesia: Monitor Anesthesia Care

## 2018-06-08 NOTE — Progress Notes (Signed)
D: Pt denies SI/HI/AVH. Pt was demanding Ativan earlier , but pt accepted Vistaril and stated it helped, pt was given 1x dose of 50 mg Vistaril due to pt having elevated CIWA score, but not above 10. Pt educated that she could have an Ativan if her withdrawal Sx scored high enough for it , but the Vistaril would be what she could have if her score warranted it. Pt appeared to understand.    A: Pt was offered support and encouragement. Pt was given scheduled medications. Pt was encourage to attend groups. Q 15 minute checks were done for safety.   R:Pt attends groups and interacts well with peers and staff. Pt is taking medication. Pt has no complaints.Pt receptive to treatment and safety maintained on unit.   Problem: Education: Goal: Emotional status will improve Outcome: Progressing   Problem: Education: Goal: Mental status will improve Outcome: Progressing   Problem: Coping: Goal: Ability to verbalize frustrations and anger appropriately will improve Outcome: Progressing

## 2018-06-08 NOTE — Progress Notes (Signed)
[facility] MD Progress Note  06/08/2018 3:34 PM Kristina Huffman  MRN:  [id number] Subjective: patient reports she is feeling better than on admission. Denies suicidal ideations. Denies medication side effects other than some loose stools which she attributes to Metformin. Denies residual or ongoing symptoms of alcohol WDL. States " I am really feeling a lot better, and I hope I can go home soon". Objective: Patient seen and case discussed with treatment team. 57 year old female with history significant for alcohol dependence and bipolar disorder.  Presented for depression, suicidal ideations, relapse on ETOH. As above, at this time reports feeling better, less depressed, less anxious, and denies suicidal ideations  Denies medication side effects. Has been visible on unit, going to some groups . No significant alcohol WDL symptoms- presents calm, in no acute distress , vitals stable. Currently denies cravings , we discussed options such as Campral or Naltrexone, but not currently interested .  Principal Problem:  Alcohol Use Disorder, Bipolar Disorder by history  Diagnosis:   Patient Active Problem List   Diagnosis Date Noted  . Alcohol use disorder, severe, dependence ([place]) [F10.20] 06/06/2018  . Chronic hepatitis C without hepatic coma ([place]) [B18.2] 06/04/2016  . Substance induced mood disorder ([place]) [F19.94] 09/20/2013  . Alcohol dependency ([place]) [F10.20] 11/11/2011    Class: Acute  . Benzodiazepine dependence ([place]) [F13.20] 09/21/2011  . Bipolar 1 disorder, mixed, moderate ([place]) [F31.62] 09/21/2011  . 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 PTSD (post-traumatic stress disorder) [F43.10] 09/21/2011  . Homeless [Z59.0] 09/21/2011   Total Time spent with patient: 20 minutes  Past Psychiatric History: See admission H&P  Past Medical History:  Past Medical History:  Diagnosis Date  . Anxiety   . Asthma   . Bipolar disorder ([place])   . Depression   . Diabetes mellitus  without complication ([place])   . ETOH abuse   . Hepatitis C   . Heroin abuse ([place])   . History of MRSA infection    legs and spread to face  . Hypertension     Past Surgical History:  Procedure Laterality Date  . BACK SURGERY    . RADIAL HEAD ARTHROPLASTY  08/09/2012   Procedure: RADIAL HEAD ARTHROPLASTY;  Surgeon: Schuyler Amor, MD;  Location: [facility];  Service: Orthopedics;  Laterality: Left;  Left Radial head Replacement   Family History:  Family History  Problem Relation Age of Onset  . Depression Mother   . Osteoporosis Mother   . COPD Mother   . Diabetes Father   . Hypertension Father   . Congestive Heart Failure Father   . Aneurysm Father    Family Psychiatric  History: See admission H&P Social History:  Social History   Substance and Sexual Activity  Alcohol Use Yes   Comment: clean for 1 year and 2 months     Social History   Substance and Sexual Activity  Drug Use No   Comment: no drug use for 3 years    Social History   Socioeconomic History  . Marital status: Married    Spouse name: Not on file  . Number of children: Not on file  . Years of education: Not on file  . Highest education level: Not on file  Occupational History  . Not on file  Social Needs  . Financial resource strain: Not on file  . Food insecurity:    Worry: Not on file    Inability: Not on file  . Transportation needs:    Medical: Not on  file    Non-medical: Not on file  Tobacco Use  . Smoking status: Never Smoker  . Smokeless tobacco: Never Used  Substance and Sexual Activity  . Alcohol use: Yes    Comment: clean for 1 year and 2 months  . Drug use: No    Comment: no drug use for 3 years  . Sexual activity: Yes    Birth control/protection: None  Lifestyle  . Physical activity:    Days per week: Not on file    Minutes per session: Not on file  . Stress: Not on file  Relationships  . Social connections:    Talks on phone: Not on file    Gets together: Not on file     Attends religious service: Not on file    Active member of club or organization: Not on file    Attends meetings of clubs or organizations: Not on file    Relationship status: Not on file  Other Topics Concern  . Not on file  Social History Narrative  . Not on file   Additional Social History:   Sleep: improving   Appetite:  Good  Current Medications: Current Facility-Administered Medications  Medication Dose Route Frequency Provider Last Rate Last Dose  . albuterol (PROVENTIL HFA;VENTOLIN HFA) 108 (90 Base) MCG/ACT inhaler 2 puff  2 puff Inhalation Q6H PRN Patriciaann Clan E, [name]   2 puff at 06/06/18 2318  . atorvastatin (LIPITOR) tablet 10 mg  10 mg Oral QHS Laverle Hobby, [name]   10 mg at 06/07/18 2136  . docusate sodium (COLACE) capsule 100 mg  100 mg Oral Daily PRN Patriciaann Clan E, [name]      . docusate sodium (COLACE) capsule 100 mg  100 mg Oral QHS Sharma Covert, MD   100 mg at 06/07/18 2137  . FLUoxetine (PROZAC) capsule 40 mg  40 mg Oral Daily Sharma Covert, MD   40 mg at 06/08/18 0806  . gabapentin (NEURONTIN) capsule 400 mg  400 mg Oral QID Cobos, Myer Peer, MD   400 mg at 06/08/18 1256  . hydrochlorothiazide (HYDRODIURIL) tablet 25 mg  25 mg Oral Daily Sharma Covert, MD   Stopped at 06/08/18 [id number]  . hydrOXYzine (ATARAX/VISTARIL) tablet 25 mg  25 mg Oral Q6H PRN Cobos, Myer Peer, MD   25 mg at 06/07/18 1951  . loperamide (IMODIUM) capsule 2-4 mg  2-4 mg Oral PRN Cobos, Myer Peer, MD      . LORazepam (ATIVAN) tablet 1 mg  1 mg Oral Q6H PRN Cobos, Myer Peer, MD   1 mg at 06/06/18 1204  . metFORMIN (GLUCOPHAGE) tablet 500 mg  500 mg Oral BID WC Sharma Covert, MD   500 mg at 06/08/18 0806  . multivitamin with minerals tablet 1 tablet  1 tablet Oral Daily Cobos, Myer Peer, MD   1 tablet at 06/08/18 0807  . ondansetron (ZOFRAN-ODT) disintegrating tablet 4 mg  4 mg Oral Q6H PRN Cobos, Myer Peer, MD      . pantoprazole (PROTONIX) EC tablet 40 mg  40 mg  Oral Daily Patriciaann Clan E, [name]   40 mg at 06/07/18 0803  . QUEtiapine (SEROQUEL) tablet 200 mg  200 mg Oral BID Laverle Hobby, [name]   200 mg at 06/08/18 1324  . thiamine (VITAMIN B-1) tablet 100 mg  100 mg Oral Daily Cobos, Myer Peer, MD   100 mg at 06/08/18 0806  . traZODone (DESYREL) tablet 50 mg  50 mg  Oral QHS PRN Cobos, Myer Peer, MD        Lab Results:  Results for orders placed or performed during the hospital encounter of 06/06/18 (from the past 48 hour(s))  Glucose, capillary     Status: Abnormal   Collection Time: 06/07/18  6:00 AM  Result Value Ref Range   Glucose-Capillary 107 (H) 70 - 99 mg/dL   Comment 1 Notify RN    Comment 2 Document in Chart   Glucose, capillary     Status: Abnormal   Collection Time: 06/07/18  8:07 PM  Result Value Ref Range   Glucose-Capillary 108 (H) 70 - 99 mg/dL   Comment 1 Notify RN    Comment 2 Document in Chart   Glucose, capillary     Status: Abnormal   Collection Time: 06/08/18  6:13 AM  Result Value Ref Range   Glucose-Capillary 118 (H) 70 - 99 mg/dL   Comment 1 Notify RN    Comment 2 Document in Chart   Hepatic function panel     Status: Abnormal   Collection Time: 06/08/18  6:30 AM  Result Value Ref Range   Total Protein 7.0 6.5 - 8.1 g/dL   Albumin 4.2 3.5 - 5.0 g/dL   AST 38 15 - 41 U/L   ALT 55 (H) 0 - 44 U/L   Alkaline Phosphatase 85 38 - 126 U/L   Total Bilirubin 1.1 0.3 - 1.2 mg/dL   Bilirubin, Direct 0.2 0.0 - 0.2 mg/dL   Indirect Bilirubin 0.9 0.3 - 0.9 mg/dL    Comment: Performed at [facility], [place] [address] [place], [place] [address]    Blood Alcohol level:  Lab Results  Component Value Date   ETH 166 (H) 06/05/2018   ETH 102 (H) 97/10/6376    Metabolic Disorder Labs: No results found for: HGBA1C, MPG No results found for: PROLACTIN No results found for: CHOL, TRIG, HDL, CHOLHDL, VLDL, LDLCALC  Physical Findings: AIMS: Facial and Oral Movements Muscles of Facial Expression:  None, normal Lips and Perioral Area: None, normal Jaw: None, normal Tongue: None, normal,Extremity Movements Upper (arms, wrists, hands, fingers): None, normal Lower (legs, knees, ankles, toes): None, normal, Trunk Movements Neck, shoulders, hips: None, normal, Overall Severity Severity of abnormal movements (highest score from questions above): None, normal Incapacitation due to abnormal movements: None, normal Patient's awareness of abnormal movements (rate only patient's report): No Awareness, Dental Status Current problems with teeth and/or dentures?: No Does patient usually wear dentures?: No  CIWA:  CIWA-Ar Total: 0 COWS:  COWS Total Score: 0  Musculoskeletal: Strength & Muscle Tone: within normal limits Gait & Station: normal Patient leans: N/A  Psychiatric Specialty Exam: Physical Exam  Nursing note and vitals reviewed. Constitutional: She is oriented to person, place, and time. She appears well-developed and well-nourished.  HENT:  Head: Atraumatic.  Respiratory: Effort normal.  Neurological: She is alert and oriented to person, place, and time.    ROS no headache, no visual disturbances, no chest pain, no shortness of breath, no vomiting   Blood pressure 104/65, pulse 72, temperature 97.9 F (36.6 C), temperature source Oral, resp. rate (!) 24, height 5\' 6"  (1.676 m), weight 115.3 kg, last menstrual period 08/02/2013, SpO2 93 %.Body mass index is 41.05 kg/m.  General Appearance: Fairly Groomed  Eye Contact:  Fair  Speech:  Normal Rate  Volume:  Normal  Mood:  reports improving mood , remains anxious   Affect:  appropriate, vaguely anxious   Thought Process:  Linear and Descriptions of Associations: Intact  Orientation:  Other:  fully alert and attentive  Thought Content:  no hallucinations, no delusions, not internally preoccupied   Suicidal Thoughts:  No denies suicidal ideations, denies self injurious ideations, denies homicidal ideations  Homicidal Thoughts:   No  Memory:  recent and remote grossly   Judgement:  Other:  improving   Insight:  improving  Psychomotor Activity:  Normal- no psychomotor agitation, minimal distal tremors, no diaphoresis or acute distress  Concentration:  Concentration: Good and Attention Span: Good  Recall:  Good  Fund of Knowledge:  Good  Language:  Good  Akathisia:  Yes  Handed:  Right  AIMS (if indicated):     Assets:  Communication Skills Desire for Improvement Financial Resources/Insurance Housing Physical Health Resilience Social Support  ADL's:  Intact  Cognition:  WNL  Sleep:  Number of Hours: 5.75   Assessment -57 year old female, history of Bipolar Disorder and Alcohol Use Disorder. Presented to hospital voluntarily due to relapse, worsening depression, suicidal thoughts. Currently feeling a lot better, describes much improved mood, no suicidal ideations and is future oriented, hoping for discharge soon. No current significant alcohol WDL symptoms and vitals are stable Currently tolerating medications well ( Prozac, Seroquel,Neurontin)   Treatment Plan Summary: Treatment plan reviewed as below today 9/17 Encourage efforts to work on coping skills and symptom reduction Encourage efforts to work on sobriety and relapse prevention  Treatment team working on disposition planning options Continue Neurontin 400 mgrs QID for anxiety Continue Prozac 40 mgrs QDAY for depression , anxiety Continue Seroquel 200 mgrs BID for mood disorder Continue Ativan PRN for potential alcohol WDL as per CIWA scoring    Jenne Campus, MD 06/08/2018, 3:34 PM   Patient ID: Kristina Huffman, female   DOB: [birth year], 57 y.o.   MRN: [id number]

## 2018-06-08 NOTE — Plan of Care (Signed)
  Problem: Education: Goal: Emotional status will improve Outcome: Progressing   Patient denies thoughts of SI/HI this morning.  Problem: Safety: Goal: Periods of time without injury will increase Outcome: Progressing   Patient is on q15 minute safety checks and low fall risk precautions.  Baldo Daub, RN, BSN 06/08/2018 10:29 AM

## 2018-06-08 NOTE — Progress Notes (Signed)
Patient ID: Kristina Huffman, female   DOB: Sep 28, 1960, 57 y.o.   MRN: [id number]  Nursing Progress Note [id number]  Data: Patient presents anxious and with complaints of withdrawal symptoms this morning. Patient complaint with scheduled medications and denied need for PRNs. Patient completed self-inventory sheet and rates depression, hopelessness, and anxiety 0,0,8 respectively. Patient rates their sleep and appetite as good/good respectively. Patient states goal for today is to "stay in groups and stay out of bed". Patient is seen attending groups and visible in the milieu. Patient currently denies SI/HI/AVH.   Action: Patient is educated about and provided medication per provider's orders. Patient safety maintained with q15 min safety checks and frequent rounding. Low fall risk precautions in place. Emotional support given. 1:1 interaction and active listening provided. Patient encouraged to attend meals, groups, and work on treatment plan and goals. Labs, vital signs and patient behavior monitored throughout shift.   Response: Patient remains safe on the unit at this time and agrees to come to staff with any issues/concerns. Patient is interacting with peers appropriately on the unit. Will continue to support and monitor.

## 2018-06-08 NOTE — [facility] Group Notes (Signed)
[facility] Group Therapy 06/08/2018 1:15pm  Type of Therapy: Mental Health Association Presentation  Participation Level: Active  Participation Quality: Attentive  Affect: Appropriate  Cognitive: Oriented  Insight: Developing/Improving  Engagement in Therapy: Engaged  Modes of Intervention: Discussion, Education and Socialization  Summary of Progress/Problems: [place] Speaker came to talk about his personal journey with mental health. The pt processed ways by which to relate to the speaker. [place] speaker provided handouts and educational information pertaining to groups and services offered by [facility]. Pt was engaged in speaker's presentation and was receptive to resources provided.    Avelina [place], LCSW 06/08/2018 2:07 PM

## 2018-06-08 NOTE — Plan of Care (Signed)
Problem: Safety: Goal: Periods of time without injury will increase Outcome: Progressing   Problem: Education: Goal: Knowledge of [place] [facility] Education information/materials will improve Outcome: Progressing Goal: Emotional status will improve Outcome: Progressing Goal: Mental status will improve Outcome: Progressing Goal: Verbalization of understanding the information provided will improve Outcome: Progressing   Problem: Activity: Goal: Interest or engagement in activities will improve Outcome: Progressing Goal: Sleeping patterns will improve Outcome: Progressing   Problem: Coping: Goal: Ability to verbalize frustrations and anger appropriately will improve Outcome: Progressing Goal: Ability to demonstrate self-control will improve Outcome: Progressing   Problem: Health Behavior/Discharge Planning: Goal: Identification of resources available to assist in meeting health care needs will improve Outcome: Progressing Goal: Compliance with treatment plan for underlying cause of condition will improve Outcome: Progressing   Problem: Physical Regulation: Goal: Ability to maintain clinical measurements within normal limits will improve Outcome: Progressing   Problem: Safety: Goal: Periods of time without injury will increase Outcome: Progressing   Problem: Education: Goal: Knowledge of disease or condition will improve Outcome: Progressing Goal: Understanding of discharge needs will improve Outcome: Progressing   Problem: Health Behavior/Discharge Planning: Goal: Ability to identify changes in lifestyle to reduce recurrence of condition will improve Outcome: Progressing Goal: Identification of resources available to assist in meeting health care needs will improve Outcome: Progressing   Problem: Physical Regulation: Goal: Complications related to the disease process, condition or treatment will be avoided or minimized Outcome: Progressing   Problem:  Safety: Goal: Ability to remain free from injury will improve Outcome: Progressing   Problem: Education: Goal: Utilization of techniques to improve thought processes will improve Outcome: Progressing Goal: Knowledge of the prescribed therapeutic regimen will improve Outcome: Progressing   Problem: Activity: Goal: Interest or engagement in leisure activities will improve Outcome: Progressing Goal: Imbalance in normal sleep/wake cycle will improve Outcome: Progressing   Problem: Coping: Goal: Coping ability will improve Outcome: Progressing Goal: Will verbalize feelings Outcome: Progressing   Problem: Health Behavior/Discharge Planning: Goal: Ability to make decisions will improve Outcome: Progressing Goal: Compliance with therapeutic regimen will improve Outcome: Progressing   Problem: Role Relationship: Goal: Will demonstrate positive changes in social behaviors and relationships Outcome: Progressing   Problem: Safety: Goal: Ability to disclose and discuss suicidal ideas will improve Outcome: Progressing Goal: Ability to identify and utilize support systems that promote safety will improve Outcome: Progressing   Problem: Self-Concept: Goal: Will verbalize positive feelings about self Outcome: Progressing Goal: Level of anxiety will decrease Outcome: Progressing   Problem: Activity: Goal: Will verbalize the importance of balancing activity with adequate rest periods Outcome: Progressing   Problem: Education: Goal: Will be free of psychotic symptoms Outcome: Progressing Goal: Knowledge of the prescribed therapeutic regimen will improve Outcome: Progressing   Problem: Coping: Goal: Coping ability will improve Outcome: Progressing Goal: Will verbalize feelings Outcome: Progressing   Problem: Health Behavior/Discharge Planning: Goal: Compliance with prescribed medication regimen will improve Outcome: Progressing   Problem: Nutritional: Goal: Ability to  achieve adequate nutritional intake will improve Outcome: Progressing   Problem: Role Relationship: Goal: Ability to communicate needs accurately will improve Outcome: Progressing Goal: Ability to interact with others will improve Outcome: Progressing   Problem: Safety: Goal: Ability to redirect hostility and anger into socially appropriate behaviors will improve Outcome: Progressing Goal: Ability to remain free from injury will improve Outcome: Progressing   Problem: Self-Care: Goal: Ability to participate in self-care as condition permits will improve Outcome: Progressing   Problem: Self-Concept: Goal: Will verbalize positive feelings  about self Outcome: Progressing

## 2018-06-08 NOTE — [facility] Group Notes (Signed)
Adult Psychoeducational Group Note  Date:  06/08/2018 Time:  4:00 pm  Group Topic/Focus: Therapeutic Relaxation Self Care:   The focus of this group is to help patients understand the importance of self-care in order to improve or restore emotional, physical, spiritual, interpersonal, and financial health.  Participation Level:  Active  Participation Quality:  Appropriate  Affect:  Appropriate  Cognitive:  Alert and Oriented  Insight: Improving  Engagement in Group:  Developing/Improving  Modes of Intervention:  Activity, Discussion, Education, Rapport Building and Socialization  Additional Comments:  Patient listened but did not contribute verbally.

## 2018-06-09 LAB — GLUCOSE, CAPILLARY: Glucose-Capillary: 110 mg/dL — ABNORMAL HIGH (ref 70–99)

## 2018-06-09 MED ORDER — GABAPENTIN 400 MG PO CAPS: 400.0000 mg | ORAL_CAPSULE | Freq: Four times a day (QID) | ORAL | 0 refills | Status: DC

## 2018-06-09 MED ORDER — TRAZODONE HCL 50 MG PO TABS: 50.0000 mg | ORAL_TABLET | Freq: Every evening | ORAL | 0 refills | Status: DC | PRN

## 2018-06-09 MED ORDER — DOCUSATE SODIUM 100 MG PO CAPS: 100.0000 mg | ORAL_CAPSULE | Freq: Every day | ORAL | 0 refills | Status: DC | PRN

## 2018-06-09 MED ORDER — ATORVASTATIN CALCIUM 10 MG PO TABS: 10.0000 mg | ORAL_TABLET | Freq: Every day | ORAL | 0 refills | Status: DC

## 2018-06-09 MED ORDER — HYDROXYZINE HCL 25 MG PO TABS: ORAL_TABLET | ORAL | 0 refills | Status: DC

## 2018-06-09 MED ORDER — METFORMIN HCL 500 MG PO TABS: 500.0000 mg | ORAL_TABLET | Freq: Two times a day (BID) | ORAL | 0 refills | Status: DC

## 2018-06-09 MED ORDER — QUETIAPINE FUMARATE 200 MG PO TABS: 200.0000 mg | ORAL_TABLET | Freq: Two times a day (BID) | ORAL | 0 refills | Status: DC

## 2018-06-09 MED ORDER — ALBUTEROL SULFATE HFA 108 (90 BASE) MCG/ACT IN AERS: 2.0000 | INHALATION_SPRAY | Freq: Four times a day (QID) | RESPIRATORY_TRACT | 0 refills | Status: DC | PRN

## 2018-06-09 MED ORDER — HYDROCHLOROTHIAZIDE 25 MG PO TABS: 25.0000 mg | ORAL_TABLET | Freq: Every day | ORAL | 0 refills | Status: DC

## 2018-06-09 MED ORDER — FLUOXETINE HCL 40 MG PO CAPS: 40.0000 mg | ORAL_CAPSULE | Freq: Every day | ORAL | 0 refills | Status: DC

## 2018-06-09 NOTE — Progress Notes (Signed)
D:  Patient's self inventory sheet, patient sleeps good, no sleep medication.  Good appetite, normal energy level, good concentration.  Denied depression, hopeless and anxiety.  Denied withdrawals.  Denied SI.  Denied physical problems.  Denied physical pain.  Goal is staying positive.  Plans to stay among other people and socialize.  Does have discharge plans. A:  Patient refused multivitamin and protonix.  Emotional support and encouragement given patient. R:  Denied SI and HI, contracts for safety.  Denied A/V hallucinations.  Safety maintained with 15 minute checks.

## 2018-06-09 NOTE — [facility] Suicide Risk Assessment (Signed)
[facility] Discharge Suicide Risk Assessment   Principal Problem: <principal problem not specified> Discharge Diagnoses:  Patient Active Problem List   Diagnosis Date Noted  . Alcohol use disorder, severe, dependence ([place]) [F10.20] 06/06/2018  . Chronic hepatitis C without hepatic coma ([place]) [B18.2] 06/04/2016  . Substance induced mood disorder ([place]) [F19.94] 09/20/2013  . Alcohol dependency ([place]) [F10.20] 11/11/2011    Class: Acute  . Benzodiazepine dependence ([place]) [F13.20] 09/21/2011  . Bipolar 1 disorder, mixed, moderate ([place]) [F31.62] 09/21/2011  . 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 PTSD (post-traumatic stress disorder) [F43.10] 09/21/2011  . Homeless [Z59.0] 09/21/2011    Total Time spent with patient: 30 minutes  Musculoskeletal: Strength & Muscle Tone: within normal limits- no tremors, no diaphoresis Gait & Station: normal Patient leans: N/A  Psychiatric Specialty Exam: ROS no headache, no visual disturbances, no chest pain, no  nausea or vomiting   Blood pressure 111/77, pulse 74, temperature 98.1 F (36.7 C), resp. rate 16, height 5\' 6"  (1.676 m), weight 115.3 kg, last menstrual period 08/02/2013, SpO2 93 %.Body mass index is 41.05 kg/m.  General Appearance: improving grooming  Eye Contact::  Good  Speech:  Normal Rate409  Volume:  Normal  Mood:  reports improving mood   Affect:  appropriate  Thought Process:  Linear and Descriptions of Associations: Intact  Orientation:  Full (Time, Place, and Person)  Thought Content:  no hallucinations, no delusions, not internally preoccupied   Suicidal Thoughts:  No denies suicidal ideations, denies self injurious ideations, no homicidal or violent ideations  Homicidal Thoughts:  No  Memory:  recent and remote grossly intact   Judgement:  Other:  improving   Insight:  improving   Psychomotor Activity:  Normal  Concentration:  Good  Recall:  Good  Fund of Knowledge:Good  Language: Good  Akathisia:  Negative   Handed:  Right  AIMS (if indicated):    no abnormal or involuntary movements   Assets:  Desire for Improvement Resilience  Sleep:  Number of Hours: 6.75  Cognition: WNL  ADL's:  Intact   Mental Status Per Nursing Assessment::   On Admission:  Suicidal ideation indicated by patient, Self-harm thoughts  Demographic Factors:  22, married, has three children, on disability  Loss Factors: Relapse on alcohol, son passed away a few years ago, disability  Historical Factors: History of alcohol use disorder, history of depression, no history of prior suicide attempts  Risk Reduction Factors:   Living with another person, especially a relative and Positive coping skills or problem solving skills  Continued Clinical Symptoms:  Alert and attentive, calm, mood reported as improved and currently 8/10 with 10 being best, no thought disorder, No suicidal or self injurious ideations, no homicidal or violent ideations, no psychotic symptoms, future oriented , looking forward to seeing daughter and family . No current significant alcohol WDL symptoms Behavior calm, pleasant on approach Denies medication side effects- side effects reviewed, including risk of sedation, weight gain, metabolic , motor side effects. Advised not to drive if feeling sedated .   Cognitive Features That Contribute To Risk:  No gross cognitive deficits noted upon discharge. Is alert , attentive, and oriented x 3   Suicide Risk:  Mild:  Suicidal ideation of limited frequency, intensity, duration, and specificity.  There are no identifiable plans, no associated intent, mild dysphoria and related symptoms, good self-control (both objective and subjective assessment), few other risk factors, and identifiable protective factors, including available and accessible social support.  Follow-up Information  [facility] Follow [facility].   Why:  Please walk in within 3 days of hospital discharge to be assessed for  outpatient mental health services including: medication management and Substance Abuse Intensive Outpatient Program. Walk in hours: Monday, Wednesday, and Fridays from 11:00AM-12:30PM. Contact information: [address] [place], [place] [address] Phone: [phone] Fax: [phone]          Plan Of Care/Follow-up recommendations:  Activity:  as tolerated  Diet:  heart healthy, diabetic diet  Tests:  NA Other:  See below  Patient is expressing readiness for discharge, leaving in good spirits . Plans to return home Plans to follow up as above and with her PCP , Dr. Alfredia Client, for medical issues as needed .   Jenne Campus, MD 06/09/2018, 10:40 AM

## 2018-06-09 NOTE — Progress Notes (Signed)
Discharge Note:  Patient discharged home.  Patient denied SI and HI.  Denied A/V hallucinations.  Suicide prevention information given and discussed with patient who stated she understood and had no questions.  Patient stated she received all her belongings, clothing, toiletries, misc items, prescriptions, etc.  Patient stated she appreciated all assistance received from [facility] staff and completed her survey which she put in appropriate box.  All required discharge information given to patient at discharge.  Suicide prevention resources including My3 App given patient at discharge.

## 2018-06-09 NOTE — Discharge Summary (Addendum)
Physician Discharge Summary Note  Patient:  Kristina Huffman is an 57 y.o., female  MRN:  [id number]  DOB:  1960-10-18  Patient phone:  [phone] (home)   Patient address:   [place] [address],   Total Time spent with patient: Greater than 30 minutes  Date of Admission:  06/06/2018  Date of Discharge: 06-09-18  Reason for Admission: Suicidal ideation & alcohol intoxication.  Principal Problem: Alcohol use disorder, severe, dependence [facility])  Discharge Diagnoses: Patient Active Problem List   Diagnosis Date Noted  . Alcohol use disorder, severe, dependence ([place]) [F10.20] 06/06/2018    Priority: High  . Chronic hepatitis C without hepatic coma ([place]) [B18.2] 06/04/2016  . Substance induced mood disorder ([place]) [F19.94] 09/20/2013  . Alcohol dependency ([place]) [F10.20] 11/11/2011    Class: Acute  . Benzodiazepine dependence ([place]) [F13.20] 09/21/2011  . Bipolar 1 disorder, mixed, moderate ([place]) [F31.62] 09/21/2011  . 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 PTSD (post-traumatic stress disorder) [F43.10] 09/21/2011  . Homeless [Z59.0] 09/21/2011   Past Psychiatric History: Alcohol use disorder.  Past Medical History:  Past Medical History:  Diagnosis Date  . Anxiety   . Asthma   . Bipolar disorder ([place])   . Depression   . Diabetes mellitus without complication ([place])   . ETOH abuse   . Hepatitis C   . Heroin abuse ([place])   . History of MRSA infection    legs and spread to face  . Hypertension     Past Surgical History:  Procedure Laterality Date  . BACK SURGERY    . RADIAL HEAD ARTHROPLASTY  08/09/2012   Procedure: RADIAL HEAD ARTHROPLASTY;  Surgeon: Schuyler Amor, MD;  Location: [facility];  Service: Orthopedics;  Laterality: Left;  Left Radial head Replacement   Family History:  Family History  Problem Relation Age of Onset  . Depression Mother   . Osteoporosis Mother   . COPD Mother   . Diabetes Father   . Hypertension Father    . Congestive Heart Failure Father   . Aneurysm Father    Family Psychiatric  History: See H&P  Social History:  Social History   Substance and Sexual Activity  Alcohol Use Yes   Comment: clean for 1 year and 2 months     Social History   Substance and Sexual Activity  Drug Use No   Comment: no drug use for 3 years    Social History   Socioeconomic History  . Marital status: Married    Spouse name: Not on file  . Number of children: Not on file  . Years of education: Not on file  . Highest education level: Not on file  Occupational History  . Not on file  Social Needs  . Financial resource strain: Not on file  . Food insecurity:    Worry: Not on file    Inability: Not on file  . Transportation needs:    Medical: Not on file    Non-medical: Not on file  Tobacco Use  . Smoking status: Never Smoker  . Smokeless tobacco: Never Used  Substance and Sexual Activity  . Alcohol use: Yes    Comment: clean for 1 year and 2 months  . Drug use: No    Comment: no drug use for 3 years  . Sexual activity: Yes    Birth control/protection: None  Lifestyle  . Physical activity:    Days per week: Not on file    Minutes per session: Not  on file  . Stress: Not on file  Relationships  . Social connections:    Talks on phone: Not on file    Gets together: Not on file    Attends religious service: Not on file    Active member of club or organization: Not on file    Attends meetings of clubs or organizations: Not on file    Relationship status: Not on file  Other Topics Concern  . Not on file  Social History Narrative  . Not on file   Hospital Course: (Per Md's admission evaluation): Patient is a 57 year old female with a past psychiatric history significant for bipolar disorder as well as alcohol dependence. The patient presented to the [facility] emergency department on 06/05/2018 with suicidal ideation.Patient stated that she came here for help with her alcohol.  She stated that she had been sober earlier this year, but then had relapsed. She stated that she stays in bed, and does not care for her hygiene or grooming. She stated that she is treated for bipolar disorder by the neuropsychiatric clinic here. She reported a history of alcohol withdrawal symptoms including tremors, nausea, vomiting and diarrhea. She denied any history of seizures. Her blood alcohol on admission was 166. She has a 39 year old daughter who is in the custody of a cousin. She lives with her husband. She had a son who died in 12-28-2010 from a heroin overdose.  Kristina Huffman was admitted to the [facility] hospital requesting help with her alcholism, Her BAL on admission was 166 per toxicology test reports. She was intoxicated. She was also having suicidal thoughts. She was in need of mood stabilization as well. She received Ativan 1 mg prn for alcohol withdrawal symptoms.  Besides the PRN Ativan regimen for alcohol withdrawal symptoms, Kristina Huffman was also medicated and discharged on; Fluoxetine 40 mg for depression, Gabapentin 400 mg for agitation, Vistaril 25 mg prn for anxiety, Seroquel 200 mg for mood stabilization & Trazodone 50 mg prn for isnomnia. She also received other medication regimen for her other pre-existing medical issues that she presented. She tolerated her treatment regimen without any significant adverse effects and or reactions reported. She was enrolled & participated in the AA/NA meetings and group counseling sessions being offered and held on this unit. She learned coping skills.  Kristina Huffman's symptoms responded well to her treatment regiment. She is currently mentally & medically.She is being discharged to her home to continue mental health treatment on an outpatient basis as noted below. She has been given all the necessary information needed to make this appointment without problems. Upon discharge, she denies any SIHI, AVH, delusional thoughts, paranoia & or withdrawal symptoms. She  left [facility] with all personal belongings in no distress.   Physical Findings: AIMS: Facial and Oral Movements Muscles of Facial Expression: None, normal Lips and Perioral Area: None, normal Jaw: None, normal Tongue: None, normal,Extremity Movements Upper (arms, wrists, hands, fingers): None, normal Lower (legs, knees, ankles, toes): None, normal, Trunk Movements Neck, shoulders, hips: None, normal, Overall Severity Severity of abnormal movements (highest score from questions above): None, normal Incapacitation due to abnormal movements: None, normal Patient's awareness of abnormal movements (rate only patient's report): No Awareness, Dental Status Current problems with teeth and/or dentures?: No Does patient usually wear dentures?: No  CIWA:  CIWA-Ar Total: 1 COWS:  COWS Total Score: 1  Musculoskeletal: Strength & Muscle Tone: within normal limits Gait & Station: normal Patient leans: N/A  Psychiatric Specialty Exam: Physical Exam  Nursing note  and vitals reviewed. Constitutional: She appears well-developed.  HENT:  Head: Normocephalic.  Eyes: Pupils are equal, round, and reactive to light.  Neck: Normal range of motion.  Cardiovascular: Normal rate.  Respiratory: Effort normal.  GI: Soft.  Genitourinary:  Genitourinary Comments: Deferred  Musculoskeletal: Normal range of motion.  Neurological: She is alert.  Skin: Skin is warm.    Review of Systems  Constitutional: Negative.   HENT: Negative.   Eyes: Negative.   Respiratory: Negative.   Cardiovascular: Negative.   Gastrointestinal: Negative.   Genitourinary: Negative.   Musculoskeletal: Negative.   Skin: Negative.   Neurological: Negative.   Endo/Heme/Allergies: Negative.   Psychiatric/Behavioral: Positive for depression (Stable) and substance abuse (Hx. alcoholism). Negative for hallucinations, memory loss and suicidal ideas. The patient has insomnia (Stable). The patient is not nervous/anxious (Stable).      Blood pressure 111/77, pulse 74, temperature 98.1 F (36.7 C), resp. rate 16, height 5\' 6"  (1.676 m), weight 115.3 kg, last menstrual period 08/02/2013, SpO2 93 %.Body mass index is 41.05 kg/m.  See Md's SRA   Have you used any form of tobacco in the last 30 days? (Cigarettes, Smokeless Tobacco, Cigars, and/or Pipes): No  Has this patient used any form of tobacco in the last 30 days? (Cigarettes, Smokeless Tobacco, Cigars, and/or Pipes): Yes, an FDA-approved tobacco cessation medication was offered at discharge.  Blood Alcohol level:  Lab Results  Component Value Date   ETH 166 (H) 06/05/2018   ETH 102 (H) 41/32/4401    Metabolic Disorder Labs:  No results found for: HGBA1C, MPG No results found for: PROLACTIN No results found for: CHOL, TRIG, HDL, CHOLHDL, VLDL, LDLCALC  See Psychiatric Specialty Exam and Suicide Risk Assessment completed by Attending Physician prior to discharge.  Discharge destination:  Home  Is patient on multiple antipsychotic therapies at discharge:  No   Has Patient had three or more failed trials of antipsychotic monotherapy by history:  No  Recommended Plan for Multiple Antipsychotic Therapies: NA  Allergies as of 06/09/2018      Reactions   Sulfa Antibiotics Shortness Of Breath, Swelling   Tight in throat      Medication List    STOP taking these medications   escitalopram 20 MG tablet Commonly known as:  LEXAPRO   ibuprofen 200 MG tablet Commonly known as:  ADVIL,MOTRIN   ketoconazole 2 % cream Commonly known as:  NIZORAL   lisinopril 40 MG tablet Commonly known as:  PRINIVIL,ZESTRIL   ondansetron 4 MG tablet Commonly known as:  ZOFRAN   pantoprazole 40 MG tablet Commonly known as:  PROTONIX     TAKE these medications     Indication  albuterol 108 (90 Base) MCG/ACT inhaler Commonly known as:  PROVENTIL HFA;VENTOLIN HFA Inhale 2 puffs into the lungs every 6 (six) hours as needed for wheezing.  Indication:  Asthma    atorvastatin 10 MG tablet Commonly known as:  LIPITOR Take 1 tablet (10 mg total) by mouth at bedtime. For high cholesterol What changed:  additional instructions  Indication:  High Amount of Fats in the Blood, High Amount of Triglycerides in the Blood   docusate sodium 100 MG capsule Commonly known as:  COLACE Take 1 capsule (100 mg total) by mouth daily as needed for mild constipation. (May buy from over the counter): For constipation  Indication:  Constipation   FLUoxetine 40 MG capsule Commonly known as:  PROZAC Take 1 capsule (40 mg total) by mouth daily. For depression Start taking on:  06/10/2018 What changed:    when to take this  additional instructions  Indication:  Major Depressive Disorder   gabapentin 400 MG capsule Commonly known as:  NEURONTIN Take 1 capsule (400 mg total) by mouth 4 (four) times daily. For agitation What changed:    medication strength  how much to take  additional instructions  Another medication with the same name was removed. Continue taking this medication, and follow the directions you see here.  Indication:  Agitation   hydrochlorothiazide 25 MG tablet Commonly known as:  HYDRODIURIL Take 1 tablet (25 mg total) by mouth daily. For high blood pressure Start taking on:  06/10/2018 What changed:    when to take this  additional instructions  Indication:  High Blood Pressure Disorder   hydrOXYzine 25 MG tablet Commonly known as:  ATARAX/VISTARIL Take 1 tablet (25 mg) by mouth four times daily as needed: For anxiety  Indication:  Feeling Anxious   metFORMIN 500 MG tablet Commonly known as:  GLUCOPHAGE Take 1 tablet (500 mg total) by mouth 2 (two) times daily with a meal. For diabetes management What changed:    when to take this  additional instructions  Indication:  Type 2 Diabetes   QUEtiapine 200 MG tablet Commonly known as:  SEROQUEL Take 1 tablet (200 mg total) by mouth 2 (two) times daily. For mood control What  changed:    medication strength  how much to take  additional instructions  Another medication with the same name was removed. Continue taking this medication, and follow the directions you see here.  Indication:  Mood control   traZODone 50 MG tablet Commonly known as:  DESYREL Take 1 tablet (50 mg total) by mouth at bedtime as needed for sleep.  Indication:  Trouble Sleeping      Follow-up Information    [facility] Follow [facility].   Why:  Please walk in within 3 days of hospital discharge to be assessed for outpatient mental health services including: medication management and Substance Abuse Intensive Outpatient Program. Walk in hours: Monday, Wednesday, and Fridays from 11:00AM-12:30PM. Contact information: [place], [place] [address] Phone: [phone] Fax: [phone]         Follow-up recommendations: Activity:  As tolerated Diet: As recommended by your primary care doctor. Keep all scheduled follow-up appointments as recommended.   Comments: Patient is instructed prior to discharge to: Take all medications as prescribed by his/her mental healthcare provider. Report any adverse effects and or reactions from the medicines to his/her outpatient provider promptly. Patient has been instructed & cautioned: To not engage in alcohol and or illegal drug use while on prescription medicines. In the event of worsening symptoms, patient is instructed to call the crisis hotline, 911 and or go to the nearest ED for appropriate evaluation and treatment of symptoms. To follow-up with his/her primary care provider for your other medical issues, concerns and or health care needs.   Signed: Lindell Spar, NP, PMHNP, FNP-BC 06/09/2018, 2:08 PM   Patient seen, Suicide Assessment Completed.  Disposition Plan Reviewed

## 2018-06-09 NOTE — [facility] Group Notes (Signed)
Adult Psychoeducational Group Note  Date:  06/09/2018 Time:  12:20 AM  Group Topic/Focus:  Wrap-Up Group:   The focus of this group is to help patients review their daily goal of treatment and discuss progress on daily workbooks.  Participation Level:  Active  Participation Quality:  Appropriate and Attentive  Affect:  Appropriate  Cognitive:  Alert and Appropriate  Insight: Appropriate and Good  Engagement in Group:  Engaged  Modes of Intervention:  Discussion and Education  Additional Comments:  Pt attended and participated in wrap up group this evening. Pt rated their day a 9/10, due to them having a great day and staying involved in the hospital activities. Pt completed their goal of going to all the groups today.   Cristi Loron 06/09/2018, 12:20 AM

## 2018-06-09 NOTE — Progress Notes (Signed)
  [facility] Adult Case Management Discharge Plan :  Will you be returning to the same living situation after discharge:  Yes,  home At discharge, do you have transportation home?: Yes,  husband Do you have the ability to pay for your medications: Yes,  medicaid  Release of information consent forms completed and submitted to medical records by CSW.   Patient to Follow up at: Follow-up Information    [facility].   Why:  Please walk in within 3 days of hospital discharge to be assessed for outpatient mental health services including: medication management and Substance Abuse Intensive Outpatient Program. Walk in hours: Monday, Wednesday, and Fridays from 11:00AM-12:30PM. Contact information: [place], [place] [address] Phone: [phone] Fax: [phone]          Next level of care provider has access to [place] and Suicide Prevention discussed: Yes,  SPE completed with pt; pt declined to consent to collateral contact. SPI pamphlet and mobile Crisis information provided to pt.   Have you used any form of tobacco in the last 30 days? (Cigarettes, Smokeless Tobacco, Cigars, and/or Pipes): No  Has patient been referred to the Quitline?: N/A patient is not a smoker  Patient has been referred for addiction treatment: Yes  Avelina Laine, LCSW 06/09/2018, 9:46 AM

## 2018-06-09 NOTE — Progress Notes (Signed)
CPAP returned to nurses station. All cords and hoses in place. CPAP place in Med room RN will continue to monitor

## 2018-06-09 NOTE — Progress Notes (Signed)
Nursing note 7p-7a  Pt observed interacting with peers on unit this shift. Displayed a flat affect and anxious mood upon interaction with this Probation officer. Pt denies pain ,denies SI/HI, and also denies any audio or visual hallucinations at this time. Pt complains of increased anxiety. See Mar for prn medication administration as ordered by MD. Pt is able to verbally contract for safety with this RN. Goal: " go to my groups"  Pt is now resting in bed with eyes closed, with no signs or symptoms of pain or distress noted. Pt continues to remain safe on the unit and is observed by rounding every 15 min. RN will continue to monitor.

## 2018-06-09 NOTE — Progress Notes (Signed)
Recreation Therapy Notes  Date: 9.18.19 Time: 0930 Location: 300 Hall Dayroom  Group Topic: Stress Management  Goal Area(s) Addresses:  Patient will verbalize importance of using healthy stress management.  Patient will identify positive emotions associated with healthy stress management.   Behavioral Response: Engaged  Intervention: Stress Management  Activity :  Meditation.  LRT introduced the stress management technique of meditation to patients.  LRT played a meditation on choice for patients.  Patients were to follow along as meditation played to engage in the activity.  Education:  Stress Management, Discharge Planning.   Education Outcome: Acknowledges edcuation/In group clarification offered/Needs additional education  Clinical Observations/Feedback: Pt attended and participated in group.    Victorino Sparrow, LRT/CTRS         Ria Comment, Meral Geissinger A 06/09/2018 10:49 AM

## 2018-06-10 ENCOUNTER — Ambulatory Visit: Payer: Self-pay

## 2018-06-17 ENCOUNTER — Ambulatory Visit: Payer: Self-pay

## 2018-07-15 ENCOUNTER — Other Ambulatory Visit: Payer: Self-pay | Admitting: Gastroenterology

## 2018-08-18 ENCOUNTER — Other Ambulatory Visit: Payer: Self-pay | Admitting: Gastroenterology

## 2018-08-24 ENCOUNTER — Ambulatory Visit (HOSPITAL_COMMUNITY): Admission: RE | Admit: 2018-08-24 | Payer: Medicaid Other | Source: Ambulatory Visit | Admitting: Gastroenterology

## 2018-08-24 ENCOUNTER — Encounter (HOSPITAL_COMMUNITY): Admission: RE | Payer: Self-pay | Source: Ambulatory Visit

## 2018-08-24 SURGERY — COLONOSCOPY WITH PROPOFOL
Anesthesia: Monitor Anesthesia Care

## 2018-09-27 ENCOUNTER — Emergency Department (HOSPITAL_COMMUNITY)
Admission: EM | Admit: 2018-09-27 | Discharge: 2018-09-27 | Disposition: A | Discharge status: Home / Self Care | Payer: Medicaid Other | Attending: Emergency Medicine | Admitting: Emergency Medicine

## 2018-09-27 ENCOUNTER — Other Ambulatory Visit: Payer: Self-pay | Admitting: Gastroenterology

## 2018-09-27 DIAGNOSIS — R4589 Other symptoms and signs involving emotional state: Secondary | ICD-10-CM | POA: Diagnosis not present

## 2018-09-27 DIAGNOSIS — E119 Type 2 diabetes mellitus without complications: Secondary | ICD-10-CM | POA: Insufficient documentation

## 2018-09-27 DIAGNOSIS — I10 Essential (primary) hypertension: Secondary | ICD-10-CM | POA: Insufficient documentation

## 2018-09-27 DIAGNOSIS — R45851 Suicidal ideations: Secondary | ICD-10-CM

## 2018-09-27 DIAGNOSIS — Z79899 Other long term (current) drug therapy: Secondary | ICD-10-CM | POA: Diagnosis not present

## 2018-09-27 DIAGNOSIS — J45909 Unspecified asthma, uncomplicated: Secondary | ICD-10-CM | POA: Insufficient documentation

## 2018-09-27 LAB — COMPREHENSIVE METABOLIC PANEL
ALT: 67 U/L — ABNORMAL HIGH (ref 0–44)
AST: 65 U/L — ABNORMAL HIGH (ref 15–41)
Albumin: 4.4 g/dL (ref 3.5–5.0)
Alkaline Phosphatase: 100 U/L (ref 38–126)
Anion gap: 14 (ref 5–15)
BUN: 12 mg/dL (ref 6–20)
CO2: 19 mmol/L — ABNORMAL LOW (ref 22–32)
Calcium: 9.6 mg/dL (ref 8.9–10.3)
Chloride: 105 mmol/L (ref 98–111)
Creatinine, Ser: 0.96 mg/dL (ref 0.44–1.00)
GFR calc Af Amer: >60 mL/min (ref >60)
GFR calc non Af Amer: >60 mL/min (ref >60)
Glucose, Bld: 134 mg/dL — ABNORMAL HIGH (ref 70–99)
Potassium: 4.1 mmol/L (ref 3.5–5.1)
Sodium: 138 mmol/L (ref 135–145)
Total Bilirubin: 1.4 mg/dL — ABNORMAL HIGH (ref 0.3–1.2)
Total Protein: 7.5 g/dL (ref 6.5–8.1)

## 2018-09-27 LAB — CBC
HCT: 46.2 % — ABNORMAL HIGH (ref 36.0–46.0)
Hemoglobin: 15.1 g/dL — ABNORMAL HIGH (ref 12.0–15.0)
MCH: 31.3 pg (ref 26.0–34.0)
MCHC: 32.7 g/dL (ref 30.0–36.0)
MCV: 95.7 fL (ref 80.0–100.0)
Platelets: 276 10*3/uL (ref 150–400)
RBC: 4.83 MIL/uL (ref 3.87–5.11)
RDW: 13.7 % (ref 11.5–15.5)
WBC: 6.6 10*3/uL (ref 4.0–10.5)
nRBC: 0 % (ref 0.0–0.2)

## 2018-09-27 LAB — I-STAT BETA HCG BLOOD, ED (MC, WL, AP ONLY): I-stat hCG, quantitative: <5 m[IU]/mL (ref <5)

## 2018-09-27 LAB — ETHANOL: Alcohol, Ethyl (B): 171 mg/dL — ABNORMAL HIGH (ref <10)

## 2018-09-27 NOTE — ED Triage Notes (Signed)
Pt arrives voluntary from home with [employer] for SI thoughts and called her counselor and told them she was going to take all of her medications. Pt has also been drinking 1-2 bottles of wine per day.  Pt is calm and cooperative in triage

## 2018-09-27 NOTE — ED Notes (Signed)
Pt is seen and heard wandering [place] stating she wants to leave; pt has gone to the [place] RN station several times very agitated; [employer] escorted pt back to waiting hallway in [place]; This RN placed Sitter order due to c/o SI-Monique,RN

## 2018-09-27 NOTE — ED Provider Notes (Signed)
[facility] EMERGENCY DEPARTMENT Provider Note   CSN: [id number] Arrival date & time: 09/27/18  1754     History   Chief Complaint Chief Complaint  Patient presents with  . Suicidal    HPI Kristina Huffman is a 58 y.o. female.  HPI Patient presents to the emergency department with suicidal ideation that occurred earlier today.  The patient states she has a longstanding psychiatric history and states that this time of the year really bothers her because of the death of her son 7 years ago.  The patient states that she does not feel suicidal at this time.  She states these thoughts were after having an argument with her husband who states that he does not want to be with her any longer and she states that she is going to come to the conclusion that that would be okay at this point.  Patient is very upset about being in the hallway along with some of the interactions with the staff.  Patient states that she does not appreciate being treated poorly by the staff just because she is here for psychiatric issues.  Patient states that she has no other complaints at this time.  The patient denies chest pain, shortness of breath, headache,blurred vision, neck pain, fever, cough, weakness, numbness, dizziness, anorexia, edema, abdominal pain, nausea, vomiting, diarrhea, rash, back pain, dysuria, hematemesis, bloody stool, near syncope, or syncope. Past Medical History:  Diagnosis Date  . Anxiety   . Asthma   . Bipolar disorder ([place])   . Depression   . Diabetes mellitus without complication ([place])   . ETOH abuse   . Hepatitis C   . Heroin abuse ([place])   . History of MRSA infection    legs and spread to face  . Hypertension     Patient Active Problem List   Diagnosis Date Noted  . Alcohol use disorder, severe, dependence ([place]) 06/06/2018  . Chronic hepatitis C without hepatic coma ([place]) 06/04/2016  . Substance induced mood disorder ([place]) 09/20/2013  . Alcohol dependency ([place])  11/11/2011    Class: Acute  . Benzodiazepine dependence ([place]) 09/21/2011  . Bipolar 1 disorder, mixed, moderate ([place]) 09/21/2011  . 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 PTSD (post-traumatic stress disorder) 09/21/2011  . Homeless 09/21/2011    Past Surgical History:  Procedure Laterality Date  . BACK SURGERY    . RADIAL HEAD ARTHROPLASTY  08/09/2012   Procedure: RADIAL HEAD ARTHROPLASTY;  Surgeon: Schuyler Amor, MD;  Location: [facility];  Service: Orthopedics;  Laterality: Left;  Left Radial head Replacement     OB History    Gravida  5   Para      Term      Preterm      AB  1   Living        SAB  1   TAB      Ectopic      Multiple      Live Births  4            Home Medications    Prior to Admission medications   Medication Sig Start Date End Date Taking? Authorizing Provider  albuterol (PROVENTIL HFA;VENTOLIN HFA) 108 (90 Base) MCG/ACT inhaler Inhale 2 puffs into the lungs every 6 (six) hours as needed for wheezing. 06/09/18   Lindell Spar I, NP  atorvastatin (LIPITOR) 10 MG tablet Take 1 tablet (10 mg total) by mouth at bedtime. For high cholesterol 06/09/18   Encarnacion Slates, NP  docusate sodium (COLACE) 100 MG capsule Take 1 capsule (100 mg total) by mouth daily as needed for mild constipation. (May buy from over the counter): For constipation 06/09/18   Lindell Spar I, NP  FLUoxetine (PROZAC) 40 MG capsule Take 1 capsule (40 mg total) by mouth daily. For depression 06/10/18   Lindell Spar I, NP  gabapentin (NEURONTIN) 400 MG capsule Take 1 capsule (400 mg total) by mouth 4 (four) times daily. For agitation 06/09/18   Lindell Spar I, NP  hydrochlorothiazide (HYDRODIURIL) 25 MG tablet Take 1 tablet (25 mg total) by mouth daily. For high blood pressure 06/10/18   Lindell Spar I, NP  hydrOXYzine (ATARAX/VISTARIL) 25 MG tablet Take 1 tablet (25 mg) by mouth four times daily as needed: For anxiety 06/09/18   Lindell Spar I, NP  metFORMIN (GLUCOPHAGE) 500 MG tablet  Take 1 tablet (500 mg total) by mouth 2 (two) times daily with a meal. For diabetes management 06/09/18   Lindell Spar I, NP  ondansetron (ZOFRAN) 4 MG tablet Take 4 mg by mouth every 8 (eight) hours as needed for nausea or vomiting.    [provider]  pantoprazole (PROTONIX) 40 MG tablet Take 40 mg by mouth daily.    [provider]  QUEtiapine (SEROQUEL) 200 MG tablet Take 1 tablet (200 mg total) by mouth 2 (two) times daily. For mood control 06/09/18   Lindell Spar I, NP  traZODone (DESYREL) 50 MG tablet Take 1 tablet (50 mg total) by mouth at bedtime as needed for sleep. Patient not taking: Reported on 08/17/2018 06/09/18   Encarnacion Slates, NP    Family History Family History  Problem Relation Age of Onset  . Depression Mother   . Osteoporosis Mother   . COPD Mother   . Diabetes Father   . Hypertension Father   . Congestive Heart Failure Father   . Aneurysm Father     Social History Social History   Tobacco Use  . Smoking status: Never Smoker  . Smokeless tobacco: Never Used  Substance Use Topics  . Alcohol use: Yes    Comment: clean for 1 year and 2 months  . Drug use: No    Comment: no drug use for 3 years     Allergies   Sulfa antibiotics   Review of Systems Review of Systems  All other systems negative except as documented in the HPI. All pertinent positives and negatives as reviewed in the HPI.     Marland KitchenPhysical Exam Updated Vital Signs BP (!) 128/93 (BP Location: Right Arm)   Pulse (!) 105   Temp 98.1 F (36.7 C) (Oral)   Resp 16   LMP 08/02/2013 Comment: irregular  SpO2 93%   Physical Exam Vitals signs and nursing note reviewed.  Constitutional:      General: She is not in acute distress.    Appearance: She is well-developed.  HENT:     Head: Normocephalic and atraumatic.  Eyes:     Pupils: Pupils are equal, round, and reactive to light.  Neck:     Musculoskeletal: Normal range of motion and neck supple.  Cardiovascular:      Rate and Rhythm: Normal rate and regular rhythm.     Heart sounds: Normal heart sounds. No murmur. No friction rub. No gallop.   Pulmonary:     Effort: Pulmonary effort is normal. No respiratory distress.     Breath sounds: Normal breath sounds. No wheezing.  Abdominal:     General: Bowel  sounds are normal. There is no distension.     Palpations: Abdomen is soft.     Tenderness: There is no abdominal tenderness.  Skin:    General: Skin is warm and dry.     Capillary Refill: Capillary refill takes less than 2 seconds.     Findings: No erythema or rash.  Neurological:     Mental Status: She is alert and oriented to person, place, and time.     Motor: No abnormal muscle tone.     Coordination: Coordination normal.  Psychiatric:        Mood and Affect: Mood normal. Mood is not anxious.        Behavior: Behavior is agitated.        Thought Content: Thought content is not paranoid or delusional. Thought content does not include homicidal ideation. Thought content does not include homicidal or suicidal plan.      ED Treatments / Results  Labs (all labs ordered are listed, but only abnormal results are displayed) Labs Reviewed  COMPREHENSIVE METABOLIC PANEL - Abnormal; Notable for the following components:      Result Value   CO2 19 (*)    Glucose, Bld 134 (*)    AST 65 (*)    ALT 67 (*)    Total Bilirubin 1.4 (*)    All other components within normal limits  CBC - Abnormal; Notable for the following components:   Hemoglobin 15.1 (*)    HCT 46.2 (*)    All other components within normal limits  ETHANOL  RAPID URINE DRUG SCREEN, HOSP PERFORMED  I-STAT BETA HCG BLOOD, ED ([facility], [facility], [facility] ONLY)    EKG None  Radiology No results found.  Procedures Procedures (including critical care time)  Medications Ordered in ED Medications - No data to display   Initial Impression / Assessment and Plan / ED Course  I have reviewed the triage vital signs and the nursing  notes.  Pertinent labs & imaging results that were available during my care of the patient were reviewed by me and considered in my medical decision making (see chart for details).    Patient states that at this time she is not suicidal and that these thoughts were earlier today when she was having an argument with her husband.  Is advised to return here for any worsening her condition. Final Clinical Impressions(s) / ED Diagnoses   Final diagnoses:  None    ED Discharge Orders    None       Rebeca Allegra 09/27/18 Liam Graham, MD 09/28/18 [phone]

## 2018-09-27 NOTE — ED Notes (Signed)
PA talking to patient at this time-Monique,RN

## 2018-09-27 NOTE — ED Notes (Signed)
Patient verbalizes understanding of discharge instructions. Opportunity for questioning and answers were provided. Armband removed by staff, pt discharged from ED ambulatory.   

## 2018-09-27 NOTE — Discharge Instructions (Addendum)
Return here for any worsening in your condition.  You need to see your psychiatrist tomorrow morning.

## 2018-09-28 NOTE — ED Notes (Signed)
Pt has been frequently up at the desk requesting to leave. D/t presenting complaint it was explained to pt by multiple staff members that an APP would have to clear her to go. Pt denies SI to this Probation officer. PA Lawyer has seen pt and is preparing for d/c. Pt has been to desk 5 times to request phone usage. Phone provided 3 times by this RN and 1 by another. On last request for phone, this RN advised pt it would be one moment while I completed a task. Pt stated "you are such a deplorable bitch. You just sit up here and look all busy and you won't even help me". Advised pt I would provide a phone once completed w/ task. Pt required multiple redirection by [employer] and security to wait in recliner. Pt repeatedly approached desk yelling and becoming increasingly belligerent with this RN and other staff members. Security standby at this time.

## 2018-09-28 NOTE — ED Notes (Signed)
On DC, pt requesting this RNs name. First name and last initial provided. RN thanked pt for pts during DC process. All belongings returned to pt. This RN asked pt if she needed anything else and she stated "Fuck off, I'm not talking to you stupid bitch". Pt was escorted out by security to waiting room to await ride.

## 2018-10-21 ENCOUNTER — Other Ambulatory Visit: Payer: Self-pay | Admitting: Gastroenterology

## 2018-10-28 ENCOUNTER — Ambulatory Visit (HOSPITAL_COMMUNITY): Admission: RE | Admit: 2018-10-28 | Payer: Medicaid Other | Source: Ambulatory Visit | Admitting: Gastroenterology

## 2018-10-28 ENCOUNTER — Encounter (HOSPITAL_COMMUNITY): Admission: RE | Payer: Self-pay | Source: Ambulatory Visit

## 2018-10-28 SURGERY — COLONOSCOPY WITH PROPOFOL
Anesthesia: Monitor Anesthesia Care

## 2018-11-15 ENCOUNTER — Encounter (HOSPITAL_COMMUNITY): Payer: Self-pay

## 2018-11-15 ENCOUNTER — Emergency Department (HOSPITAL_COMMUNITY)
Admission: EM | Admit: 2018-11-15 | Discharge: 2018-11-16 | Disposition: A | Discharge status: Home / Self Care | Payer: Medicaid Other | Attending: Emergency Medicine | Admitting: Emergency Medicine

## 2018-11-15 DIAGNOSIS — I10 Essential (primary) hypertension: Secondary | ICD-10-CM | POA: Insufficient documentation

## 2018-11-15 DIAGNOSIS — J45909 Unspecified asthma, uncomplicated: Secondary | ICD-10-CM | POA: Insufficient documentation

## 2018-11-15 DIAGNOSIS — R1110 Vomiting, unspecified: Secondary | ICD-10-CM | POA: Diagnosis present

## 2018-11-15 DIAGNOSIS — N390 Urinary tract infection, site not specified: Secondary | ICD-10-CM | POA: Diagnosis not present

## 2018-11-15 DIAGNOSIS — R4182 Altered mental status, unspecified: Secondary | ICD-10-CM | POA: Diagnosis not present

## 2018-11-15 DIAGNOSIS — E119 Type 2 diabetes mellitus without complications: Secondary | ICD-10-CM | POA: Insufficient documentation

## 2018-11-15 DIAGNOSIS — Z7984 Long term (current) use of oral hypoglycemic drugs: Secondary | ICD-10-CM | POA: Diagnosis not present

## 2018-11-15 DIAGNOSIS — Z79899 Other long term (current) drug therapy: Secondary | ICD-10-CM | POA: Diagnosis not present

## 2018-11-15 DIAGNOSIS — F10920 Alcohol use, unspecified with intoxication, uncomplicated: Secondary | ICD-10-CM

## 2018-11-15 MED ORDER — SODIUM CHLORIDE 0.9 % IV BOLUS
1000.0000 mL | Freq: Once | INTRAVENOUS | Status: AC
  Administered 2018-11-16: 1000 mL via INTRAVENOUS

## 2018-11-15 MED ORDER — SODIUM CHLORIDE 0.9 % IV SOLN
INTRAVENOUS | Status: DC
  Administered 2018-11-16: via INTRAVENOUS
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
## 2018-11-16 ENCOUNTER — Other Ambulatory Visit: Payer: Self-pay

## 2018-11-16 ENCOUNTER — Emergency Department (HOSPITAL_COMMUNITY): Payer: Medicaid Other

## 2018-11-16 LAB — COMPREHENSIVE METABOLIC PANEL
ALT: 54 U/L — ABNORMAL HIGH (ref 0–44)
AST: 65 U/L — ABNORMAL HIGH (ref 15–41)
Albumin: 3.7 g/dL (ref 3.5–5.0)
Alkaline Phosphatase: 93 U/L (ref 38–126)
Anion gap: 13 (ref 5–15)
BUN: 15 mg/dL (ref 6–20)
CO2: 24 mmol/L (ref 22–32)
Calcium: 9.1 mg/dL (ref 8.9–10.3)
Chloride: 104 mmol/L (ref 98–111)
Creatinine, Ser: 0.8 mg/dL (ref 0.44–1.00)
GFR calc Af Amer: >60 mL/min (ref >60)
GFR calc non Af Amer: >60 mL/min (ref >60)
Glucose, Bld: 168 mg/dL — ABNORMAL HIGH (ref 70–99)
Potassium: 4.2 mmol/L (ref 3.5–5.1)
Sodium: 141 mmol/L (ref 135–145)
Total Bilirubin: 0.7 mg/dL (ref 0.3–1.2)
Total Protein: 6.8 g/dL (ref 6.5–8.1)

## 2018-11-16 LAB — URINALYSIS, ROUTINE W REFLEX MICROSCOPIC
Bilirubin Urine: NEGATIVE
Glucose, UA: NEGATIVE mg/dL
Hgb urine dipstick: NEGATIVE
Ketones, ur: NEGATIVE mg/dL
Nitrite: NEGATIVE
Protein, ur: NEGATIVE mg/dL
Specific Gravity, Urine: 1.013 (ref 1.005–1.030)
pH: 6 (ref 5.0–8.0)

## 2018-11-16 LAB — PROTIME-INR
INR: 1.1 (ref 0.8–1.2)
Prothrombin Time: 13.7 seconds (ref 11.4–15.2)

## 2018-11-16 LAB — SAMPLE TO BLOOD BANK

## 2018-11-16 LAB — CBC
HCT: 42.1 % (ref 36.0–46.0)
Hemoglobin: 13.5 g/dL (ref 12.0–15.0)
MCH: 30.7 pg (ref 26.0–34.0)
MCHC: 32.1 g/dL (ref 30.0–36.0)
MCV: 95.7 fL (ref 80.0–100.0)
Platelets: 187 10*3/uL (ref 150–400)
RBC: 4.4 MIL/uL (ref 3.87–5.11)
RDW: 13.4 % (ref 11.5–15.5)
WBC: 3.6 10*3/uL — ABNORMAL LOW (ref 4.0–10.5)
nRBC: 0 % (ref 0.0–0.2)

## 2018-11-16 LAB — LACTIC ACID, PLASMA: Lactic Acid, Venous: 2.9 mmol/L (ref 0.5–1.9)

## 2018-11-16 LAB — ETHANOL: Alcohol, Ethyl (B): 238 mg/dL — ABNORMAL HIGH (ref <10)

## 2018-11-16 LAB — CDS SEROLOGY

## 2018-11-16 MED ORDER — IOHEXOL 300 MG/ML  SOLN
100.0000 mL | Freq: Once | INTRAMUSCULAR | Status: AC | PRN
  Administered 2018-11-16: 100 mL via INTRAVENOUS

## 2018-11-16 MED ORDER — CEPHALEXIN 500 MG PO CAPS: 500.0000 mg | ORAL_CAPSULE | Freq: Two times a day (BID) | ORAL | 0 refills | Status: DC

## 2019-03-29 ENCOUNTER — Encounter: Payer: Self-pay | Admitting: *Deleted

## 2019-04-08 ENCOUNTER — Telehealth: Payer: Self-pay | Admitting: Family Medicine

## 2019-04-08 ENCOUNTER — Other Ambulatory Visit: Payer: Self-pay | Admitting: Internal Medicine

## 2019-04-08 DIAGNOSIS — Z1231 Encounter for screening mammogram for malignant neoplasm of breast: Secondary | ICD-10-CM

## 2019-04-08 NOTE — Telephone Encounter (Signed)
Attempted to call patient to inform her of an appointment time change. Provider had to make some changes in the office that affected her appointment. Left a VM for her to call us back.

## 2019-04-25 ENCOUNTER — Encounter: Payer: Medicaid Other | Admitting: Obstetrics & Gynecology

## 2019-04-25 ENCOUNTER — Encounter: Payer: Self-pay | Admitting: Family Medicine

## 2019-04-25 ENCOUNTER — Encounter: Payer: Medicaid Other | Admitting: Obstetrics and Gynecology

## 2019-05-02 ENCOUNTER — Telehealth: Payer: Self-pay | Admitting: Obstetrics & Gynecology

## 2019-05-02 NOTE — Telephone Encounter (Signed)
Patient called to say she wanted a nurse to call her back and explain what the visit is for. She thinks she may not need this visit.

## 2019-05-03 ENCOUNTER — Ambulatory Visit (HOSPITAL_COMMUNITY)
Admission: EM | Admit: 2019-05-03 | Discharge: 2019-05-03 | Disposition: A | Discharge status: Home / Self Care | Payer: Medicaid Other | Attending: Emergency Medicine | Admitting: Emergency Medicine

## 2019-05-03 ENCOUNTER — Encounter (HOSPITAL_COMMUNITY): Payer: Self-pay

## 2019-05-03 ENCOUNTER — Other Ambulatory Visit: Payer: Self-pay

## 2019-05-03 DIAGNOSIS — Z202 Contact with and (suspected) exposure to infections with a predominantly sexual mode of transmission: Secondary | ICD-10-CM

## 2019-05-03 DIAGNOSIS — Z7251 High risk heterosexual behavior: Secondary | ICD-10-CM | POA: Insufficient documentation

## 2019-05-03 DIAGNOSIS — N730 Acute parametritis and pelvic cellulitis: Secondary | ICD-10-CM | POA: Insufficient documentation

## 2019-05-03 LAB — POCT URINALYSIS DIP (DEVICE)
Bilirubin Urine: NEGATIVE
Glucose, UA: NEGATIVE mg/dL
Ketones, ur: NEGATIVE mg/dL
Nitrite: NEGATIVE
Protein, ur: NEGATIVE mg/dL
Specific Gravity, Urine: 1.015 (ref 1.005–1.030)
Urobilinogen, UA: 0.2 mg/dL (ref 0.0–1.0)
pH: 6 (ref 5.0–8.0)

## 2019-05-03 MED ORDER — CLOTRIMAZOLE 1 % EX CREA: TOPICAL_CREAM | CUTANEOUS | 0 refills | Status: DC

## 2019-05-03 MED ORDER — AZITHROMYCIN 250 MG PO TABS
1000.0000 mg | ORAL_TABLET | Freq: Once | ORAL | Status: AC
  Administered 2019-05-03: 17:00:00 1000 mg via ORAL

## 2019-05-03 MED ORDER — AZITHROMYCIN 250 MG PO TABS
ORAL_TABLET | ORAL | Status: AC
  Filled 2019-05-03: qty 4

## 2019-05-03 MED ORDER — METRONIDAZOLE 500 MG PO TABS: 500.0000 mg | ORAL_TABLET | Freq: Two times a day (BID) | ORAL | 0 refills | Status: DC

## 2019-05-03 MED ORDER — AZITHROMYCIN 250 MG PO TABS: 250.0000 mg | ORAL_TABLET | ORAL | 0 refills | Status: DC

## 2019-05-03 MED ORDER — CEFTRIAXONE SODIUM 250 MG IJ SOLR
250.0000 mg | Freq: Once | INTRAMUSCULAR | Status: AC
  Administered 2019-05-03: 250 mg via INTRAMUSCULAR

## 2019-05-03 MED ORDER — CEFTRIAXONE SODIUM 250 MG IJ SOLR
INTRAMUSCULAR | Status: AC
  Filled 2019-05-03: qty 250
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
## 2019-05-03 NOTE — ED Provider Notes (Signed)
[place]    CSN: [id number] Arrival date & time: 05/03/19  1552     History   Chief Complaint Chief Complaint  Patient presents with  . Vaginal Discharge    HPI Kristina Huffman is a 58 y.o. female.   Patient presents with a 5-day history of malodorous vaginal discharge, which was initially white and then changed to yellow-green.  She also reports lower abdominal pain.  She also reports fatigue, chills, nausea, dysuria, and diarrhea.  She denies fever, vomiting, rash, joint pain, or other symptoms.  She states she found out today that her husband had been unfaithful.     The history is provided by the patient.    Past Medical History:  Diagnosis Date  . Anxiety   . Asthma   . Bipolar disorder ([place])   . Depression   . Diabetes mellitus without complication ([place])   . ETOH abuse   . Hepatitis C   . Heroin abuse ([place])   . History of MRSA infection    legs and spread to face  . Hypertension     Patient Active Problem List   Diagnosis Date Noted  . Alcohol use disorder, severe, dependence ([place]) 06/06/2018  . Chronic hepatitis C without hepatic coma ([place]) 06/04/2016  . Substance induced mood disorder ([place]) 09/20/2013  . Alcohol dependency ([place]) 11/11/2011    Class: Acute  . Benzodiazepine dependence ([place]) 09/21/2011  . Bipolar 1 disorder, mixed, moderate ([place]) 09/21/2011  . Complicated bereavement 76/28/3151  . PTSD (post-traumatic stress disorder) 09/21/2011  . Homeless 09/21/2011    Past Surgical History:  Procedure Laterality Date  . BACK SURGERY    . RADIAL HEAD ARTHROPLASTY  08/09/2012   Procedure: RADIAL HEAD ARTHROPLASTY;  Surgeon: Schuyler Amor, MD;  Location: [place];  Service: Orthopedics;  Laterality: Left;  Left Radial head Replacement    OB History    Gravida  5   Para      Term      Preterm      AB  1   Living        SAB  1   TAB      Ectopic      Multiple      Live Births  4            Home Medications     Prior to Admission medications   Medication Sig Start Date End Date Taking? Authorizing Provider  albuterol (PROVENTIL HFA;VENTOLIN HFA) 108 (90 Base) MCG/ACT inhaler Inhale 2 puffs into the lungs every 6 (six) hours as needed for wheezing. 06/09/18   Lindell Spar I, NP  atorvastatin (LIPITOR) 10 MG tablet Take 1 tablet (10 mg total) by mouth at bedtime. For high cholesterol 06/09/18   Lindell Spar I, NP  azithromycin (ZITHROMAX) 250 MG tablet Take 1 tablet (250 mg total) by mouth as directed. Take the 4 tablets together on 05/10/2019. 05/03/19   Sharion Balloon, NP  cephALEXin (KEFLEX) 500 MG capsule Take 1 capsule (500 mg total) by mouth 2 (two) times daily. 11/16/18   Orpah Greek, MD  clotrimazole (LOTRIMIN) 1 % cream Apply to affected area 2 times daily 05/03/19   Sharion Balloon, NP  docusate sodium (COLACE) 100 MG capsule Take 1 capsule (100 mg total) by mouth daily as needed for mild constipation. (May buy from over the counter): For constipation Patient taking differently: Take 100 mg by mouth every evening. (May buy from over the counter): For constipation  06/09/18   Lindell Spar I, NP  FLUoxetine (PROZAC) 40 MG capsule Take 1 capsule (40 mg total) by mouth daily. For depression Patient taking differently: Take 80 mg by mouth daily. For depression 06/10/18   Lindell Spar I, NP  gabapentin (NEURONTIN) 400 MG capsule Take 1 capsule (400 mg total) by mouth 4 (four) times daily. For agitation 06/09/18   Lindell Spar I, NP  hydrochlorothiazide (HYDRODIURIL) 25 MG tablet Take 1 tablet (25 mg total) by mouth daily. For high blood pressure 06/10/18   Lindell Spar I, NP  hydrOXYzine (ATARAX/VISTARIL) 25 MG tablet Take 1 tablet (25 mg) by mouth four times daily as needed: For anxiety Patient not taking: Reported on 10/19/2018 06/09/18   Lindell Spar I, NP  ketoconazole (NIZORAL) 2 % cream Apply 1 application topically daily as needed for irritation.    [provider]  metFORMIN (GLUCOPHAGE)  500 MG tablet Take 1 tablet (500 mg total) by mouth 2 (two) times daily with a meal. For diabetes management 06/09/18   Lindell Spar I, NP  metroNIDAZOLE (FLAGYL) 500 MG tablet Take 1 tablet (500 mg total) by mouth 2 (two) times daily. 05/03/19   Sharion Balloon, NP  ondansetron (ZOFRAN) 4 MG tablet Take 4 mg by mouth every 8 (eight) hours as needed for nausea or vomiting.    [provider]  pantoprazole (PROTONIX) 40 MG tablet Take 40 mg by mouth daily as needed (heartburn).     [provider]  QUEtiapine (SEROQUEL) 200 MG tablet Take 1 tablet (200 mg total) by mouth 2 (two) times daily. For mood control Patient not taking: Reported on 10/18/2018 06/09/18   Lindell Spar I, NP  QUEtiapine (SEROQUEL) 300 MG tablet Take 300 mg by mouth at bedtime.    [provider]  traZODone (DESYREL) 50 MG tablet Take 1 tablet (50 mg total) by mouth at bedtime as needed for sleep. Patient not taking: Reported on 08/17/2018 06/09/18   Encarnacion Slates, NP    Family History Family History  Problem Relation Age of Onset  . Depression Mother   . Osteoporosis Mother   . COPD Mother   . Diabetes Father   . Hypertension Father   . Congestive Heart Failure Father   . Aneurysm Father     Social History Social History   Tobacco Use  . Smoking status: Never Smoker  . Smokeless tobacco: Never Used  Substance Use Topics  . Alcohol use: Yes    Comment: sometimes  . Drug use: No    Comment: no drug use for 3 years     Allergies   Sulfa antibiotics   Review of Systems Review of Systems  Constitutional: Positive for chills and fatigue. Negative for fever.  HENT: Negative for ear pain and sore throat.   Eyes: Negative for pain and visual disturbance.  Respiratory: Negative for cough and shortness of breath.   Cardiovascular: Negative for chest pain and palpitations.  Gastrointestinal: Positive for abdominal pain, diarrhea and nausea. Negative for vomiting.  Genitourinary: Positive  for dysuria and vaginal discharge. Negative for hematuria.  Musculoskeletal: Negative for arthralgias and back pain.  Skin: Negative for color change and rash.  Neurological: Negative for seizures and syncope.  All other systems reviewed and are negative.    Physical Exam Triage Vital Signs ED Triage Vitals [05/03/19 1602]  Enc Vitals Group     BP      Pulse      Resp  Temp      Temp src      SpO2      Weight      Height      Head Circumference      Peak Flow      Pain Score 0     Pain Loc      Pain Edu?      Excl. in [place]?    No data found.  Updated Vital Signs BP 128/86 (BP Location: Right Arm)   Pulse 83   Temp 98.5 F (36.9 C) (Oral)   Resp 17   LMP 08/02/2013 Comment: irregular  SpO2 100%   Visual Acuity Right Eye Distance:   Left Eye Distance:   Bilateral Distance:    Right Eye Near:   Left Eye Near:    Bilateral Near:     Physical Exam Vitals signs and nursing note reviewed. Exam conducted with a chaperone present.  Constitutional:      General: She is not in acute distress.    Appearance: She is well-developed.  HENT:     Head: Normocephalic and atraumatic.  Eyes:     Conjunctiva/sclera: Conjunctivae normal.  Neck:     Musculoskeletal: Neck supple.  Cardiovascular:     Rate and Rhythm: Normal rate and regular rhythm.     Heart sounds: No murmur.  Pulmonary:     Effort: Pulmonary effort is normal. No respiratory distress.     Breath sounds: Normal breath sounds.  Abdominal:     Palpations: Abdomen is soft.     Tenderness: There is no abdominal tenderness.  Genitourinary:    Exam position: Lithotomy position.     Vagina: Vaginal discharge and tenderness present.     Cervix: Cervical motion tenderness present.     Uterus: Normal.      Adnexa: Right adnexa normal and left adnexa normal.     Comments: Labial redness and irritation.  Moderate amount of yellow-green vaginal discharge Skin:    General: Skin is warm and dry.  Neurological:      Mental Status: She is alert.      UC Treatments / Results  Labs (all labs ordered are listed, but only abnormal results are displayed) Labs Reviewed  POCT URINALYSIS DIP (DEVICE) - Abnormal; Notable for the following components:      Result Value   Hgb urine dipstick TRACE (*)    Leukocytes,Ua MODERATE (*)    All other components within normal limits  URINE CULTURE  RPR  HIV ANTIBODY (ROUTINE TESTING W REFLEX)  CERVICOVAGINAL ANCILLARY ONLY    EKG   Radiology No results found.  Procedures Procedures (including critical care time)  Medications Ordered in UC Medications  cefTRIAXone (ROCEPHIN) injection 250 mg (250 mg Intramuscular Given 05/03/19 1707)  azithromycin (ZITHROMAX) tablet 1,000 mg (1,000 mg Oral Given 05/03/19 1707)  azithromycin (ZITHROMAX) 250 MG tablet (has no administration in time range)  cefTRIAXone (ROCEPHIN) 250 MG injection (has no administration in time range)    Initial Impression / Assessment and Plan / UC Course  I have reviewed the triage vital signs and the nursing notes.  Pertinent labs & imaging results that were available during my care of the patient were reviewed by me and considered in my medical decision making (see chart for details).   Acute pelvic inflammatory disease, unprotected sex, potential exposure to STD.  Treated today with Rocephin, Zithromax, metronidazole, clotrimazole cream.  Instructed patient to go to the emergency department if she develops acute abdominal  pain, fever, chills, vomiting, or other concerning symptoms.  Discussed that her STD tests are pending and if they come back positive, her partner will also need to be treated.  Instructed patient not to have sex for 7 days.     Final Clinical Impressions(s) / UC Diagnoses   Final diagnoses:  PID (acute pelvic inflammatory disease)  Unprotected sex  Potential exposure to STD     Discharge Instructions     You were treated today with 2 antibiotics, Rocephin  and Zithromax.  You will need to take another dose of the Zithromax 1 week from today on 05/10/2019.    Also you will need to take the prescribed metronidazole twice a day for 7 days.  Do not have sex during the 7 days.    Use the prescribed cream for your vaginal irritation.    Return here or go to the emergency department if you develop acute abdominal pain, fevers, or any other concerning symptoms.    Your STD test are pending.  If they come back positive, your sexual partner may also need to be treated.        ED Prescriptions    Medication Sig Dispense Auth. Provider   metroNIDAZOLE (FLAGYL) 500 MG tablet Take 1 tablet (500 mg total) by mouth 2 (two) times daily. 14 tablet Barkley Boards H, NP   clotrimazole (LOTRIMIN) 1 % cream Apply to affected area 2 times daily 15 g Sharion Balloon, NP   azithromycin (ZITHROMAX) 250 MG tablet Take 1 tablet (250 mg total) by mouth as directed. Take the 4 tablets together on 05/10/2019. 4 tablet Sharion Balloon, NP     Controlled Substance Prescriptions [place] Controlled Substance Registry consulted? Not Applicable   Sharion Balloon, NP 05/03/19 1756

## 2019-05-04 LAB — HIV ANTIBODY (ROUTINE TESTING W REFLEX): HIV Screen 4th Generation wRfx: NONREACTIVE

## 2019-05-04 LAB — URINE CULTURE: Culture: <10000 — AB

## 2019-05-04 LAB — RPR: RPR Ser Ql: NONREACTIVE

## 2019-05-05 LAB — CERVICOVAGINAL ANCILLARY ONLY
Bacterial vaginitis: POSITIVE — AB
Candida vaginitis: NEGATIVE
Chlamydia: NEGATIVE
Neisseria Gonorrhea: NEGATIVE
Trichomonas: POSITIVE — AB

## 2019-05-06 ENCOUNTER — Encounter (HOSPITAL_COMMUNITY): Payer: Self-pay

## 2019-05-06 ENCOUNTER — Telehealth (HOSPITAL_COMMUNITY): Payer: Self-pay | Admitting: Emergency Medicine

## 2019-05-06 NOTE — Telephone Encounter (Signed)
Patient contacted and made aware of swab   results, all questions answered. Pt concerned about her symptoms. Pt about halfway through her treatment. Pt encouraged to finish treatment, if she gets worse or does not get better, or has symptoms after she finishes the medicine, to please return to be reseen. Pt agreeable to plan, all questions answered.

## 2019-05-06 NOTE — Telephone Encounter (Signed)
Bacterial Vaginosis test is positive.  Prescription for metronidazole was given at the urgent care visit.  Trichomonas is positive. Rx metronidazole was given at the urgent care visit. Pt needs education to please refrain from sexual intercourse for 7 days to give the medicine time to work. Sexual partners need to be notified and tested/treated. Condoms may reduce risk of reinfection. Recheck for further evaluation if symptoms are not improving.   Attempted to reach patient. No answer at this time. Voicemail left.

## 2019-05-23 ENCOUNTER — Encounter (HOSPITAL_COMMUNITY): Payer: Self-pay

## 2019-05-25 ENCOUNTER — Ambulatory Visit: Payer: Medicaid Other

## 2019-10-13 ENCOUNTER — Inpatient Hospital Stay (HOSPITAL_COMMUNITY)
Admission: EM | Admit: 2019-10-13 | Discharge: 2019-10-17 | DRG: 177 | Disposition: A | Discharge status: Home / Self Care | Payer: Medicaid Other | Attending: Internal Medicine | Admitting: Internal Medicine

## 2019-10-13 ENCOUNTER — Emergency Department (HOSPITAL_COMMUNITY): Payer: Medicaid Other

## 2019-10-13 ENCOUNTER — Inpatient Hospital Stay: Admission: RE | Admit: 2019-10-13 | Payer: Medicaid Other | Source: Ambulatory Visit

## 2019-10-13 ENCOUNTER — Encounter (HOSPITAL_COMMUNITY): Payer: Self-pay | Admitting: Emergency Medicine

## 2019-10-13 DIAGNOSIS — Z818 Family history of other mental and behavioral disorders: Secondary | ICD-10-CM

## 2019-10-13 DIAGNOSIS — B182 Chronic viral hepatitis C: Secondary | ICD-10-CM | POA: Diagnosis present

## 2019-10-13 DIAGNOSIS — Z8249 Family history of ischemic heart disease and other diseases of the circulatory system: Secondary | ICD-10-CM

## 2019-10-13 DIAGNOSIS — Z6835 Body mass index (BMI) 35.0-35.9, adult: Secondary | ICD-10-CM

## 2019-10-13 DIAGNOSIS — U071 COVID-19: Principal | ICD-10-CM | POA: Diagnosis present

## 2019-10-13 DIAGNOSIS — Z8614 Personal history of Methicillin resistant Staphylococcus aureus infection: Secondary | ICD-10-CM

## 2019-10-13 DIAGNOSIS — G4733 Obstructive sleep apnea (adult) (pediatric): Secondary | ICD-10-CM | POA: Diagnosis present

## 2019-10-13 DIAGNOSIS — E1165 Type 2 diabetes mellitus with hyperglycemia: Secondary | ICD-10-CM | POA: Diagnosis present

## 2019-10-13 DIAGNOSIS — F1110 Opioid abuse, uncomplicated: Secondary | ICD-10-CM | POA: Diagnosis present

## 2019-10-13 DIAGNOSIS — I10 Essential (primary) hypertension: Secondary | ICD-10-CM | POA: Diagnosis present

## 2019-10-13 DIAGNOSIS — Z825 Family history of asthma and other chronic lower respiratory diseases: Secondary | ICD-10-CM

## 2019-10-13 DIAGNOSIS — J9600 Acute respiratory failure, unspecified whether with hypoxia or hypercapnia: Secondary | ICD-10-CM | POA: Diagnosis present

## 2019-10-13 DIAGNOSIS — J9601 Acute respiratory failure with hypoxia: Secondary | ICD-10-CM | POA: Diagnosis present

## 2019-10-13 DIAGNOSIS — F3162 Bipolar disorder, current episode mixed, moderate: Secondary | ICD-10-CM | POA: Diagnosis present

## 2019-10-13 DIAGNOSIS — E876 Hypokalemia: Secondary | ICD-10-CM | POA: Diagnosis present

## 2019-10-13 DIAGNOSIS — Z7984 Long term (current) use of oral hypoglycemic drugs: Secondary | ICD-10-CM

## 2019-10-13 DIAGNOSIS — F419 Anxiety disorder, unspecified: Secondary | ICD-10-CM | POA: Diagnosis present

## 2019-10-13 DIAGNOSIS — Z833 Family history of diabetes mellitus: Secondary | ICD-10-CM

## 2019-10-13 DIAGNOSIS — E860 Dehydration: Secondary | ICD-10-CM | POA: Diagnosis present

## 2019-10-13 DIAGNOSIS — E669 Obesity, unspecified: Secondary | ICD-10-CM | POA: Diagnosis present

## 2019-10-13 DIAGNOSIS — Z8262 Family history of osteoporosis: Secondary | ICD-10-CM

## 2019-10-13 DIAGNOSIS — J45909 Unspecified asthma, uncomplicated: Secondary | ICD-10-CM | POA: Diagnosis present

## 2019-10-13 DIAGNOSIS — Z79899 Other long term (current) drug therapy: Secondary | ICD-10-CM

## 2019-10-13 DIAGNOSIS — J1282 Pneumonia due to coronavirus disease 2019: Secondary | ICD-10-CM | POA: Diagnosis present

## 2019-10-13 DIAGNOSIS — F1010 Alcohol abuse, uncomplicated: Secondary | ICD-10-CM | POA: Diagnosis present

## 2019-10-13 DIAGNOSIS — N179 Acute kidney failure, unspecified: Secondary | ICD-10-CM | POA: Diagnosis present

## 2019-10-13 DIAGNOSIS — G629 Polyneuropathy, unspecified: Secondary | ICD-10-CM | POA: Diagnosis present

## 2019-10-13 DIAGNOSIS — B1920 Unspecified viral hepatitis C without hepatic coma: Secondary | ICD-10-CM | POA: Diagnosis present

## 2019-10-13 LAB — CBC WITH DIFFERENTIAL/PLATELET
Abs Immature Granulocytes: 0.01 10*3/uL (ref 0.00–0.07)
Basophils Absolute: 0 10*3/uL (ref 0.0–0.1)
Basophils Relative: 1 %
Eosinophils Absolute: 0 10*3/uL (ref 0.0–0.5)
Eosinophils Relative: 0 %
HCT: 42.5 % (ref 36.0–46.0)
Hemoglobin: 14.4 g/dL (ref 12.0–15.0)
Immature Granulocytes: 0 %
Lymphocytes Relative: 39 %
Lymphs Abs: 1 10*3/uL (ref 0.7–4.0)
MCH: 31.4 pg (ref 26.0–34.0)
MCHC: 33.9 g/dL (ref 30.0–36.0)
MCV: 92.6 fL (ref 80.0–100.0)
Monocytes Absolute: 0.2 10*3/uL (ref 0.1–1.0)
Monocytes Relative: 9 %
Neutro Abs: 1.3 10*3/uL — ABNORMAL LOW (ref 1.7–7.7)
Neutrophils Relative %: 51 %
Platelets: 170 10*3/uL (ref 150–400)
RBC: 4.59 MIL/uL (ref 3.87–5.11)
RDW: 13.1 % (ref 11.5–15.5)
WBC: 2.5 10*3/uL — ABNORMAL LOW (ref 4.0–10.5)
nRBC: 0 % (ref 0.0–0.2)

## 2019-10-13 LAB — COMPREHENSIVE METABOLIC PANEL
ALT: 87 U/L — ABNORMAL HIGH (ref 0–44)
AST: 98 U/L — ABNORMAL HIGH (ref 15–41)
Albumin: 3.6 g/dL (ref 3.5–5.0)
Alkaline Phosphatase: 90 U/L (ref 38–126)
Anion gap: 12 (ref 5–15)
BUN: 14 mg/dL (ref 6–20)
CO2: 27 mmol/L (ref 22–32)
Calcium: 8.7 mg/dL — ABNORMAL LOW (ref 8.9–10.3)
Chloride: 98 mmol/L (ref 98–111)
Creatinine, Ser: 1.08 mg/dL — ABNORMAL HIGH (ref 0.44–1.00)
GFR calc Af Amer: >60 mL/min (ref >60)
GFR calc non Af Amer: 57 mL/min — ABNORMAL LOW (ref >60)
Glucose, Bld: 135 mg/dL — ABNORMAL HIGH (ref 70–99)
Potassium: 3.2 mmol/L — ABNORMAL LOW (ref 3.5–5.1)
Sodium: 137 mmol/L (ref 135–145)
Total Bilirubin: 0.7 mg/dL (ref 0.3–1.2)
Total Protein: 6.8 g/dL (ref 6.5–8.1)

## 2019-10-13 LAB — LIPASE, BLOOD: Lipase: 39 U/L (ref 11–51)

## 2019-10-13 LAB — POC SARS CORONAVIRUS 2 AG -  ED: SARS Coronavirus 2 Ag: POSITIVE — AB

## 2019-10-13 MED ORDER — DEXAMETHASONE SODIUM PHOSPHATE 10 MG/ML IJ SOLN
6.0000 mg | Freq: Once | INTRAMUSCULAR | Status: AC
  Administered 2019-10-14: 6 mg via INTRAVENOUS
  Filled 2019-10-13: qty 1

## 2019-10-13 MED ORDER — IBUPROFEN 400 MG PO TABS
600.0000 mg | ORAL_TABLET | Freq: Once | ORAL | Status: AC
  Administered 2019-10-13: 600 mg via ORAL
  Filled 2019-10-13: qty 1

## 2019-10-13 MED ORDER — ALBUTEROL SULFATE HFA 108 (90 BASE) MCG/ACT IN AERS
2.0000 | INHALATION_SPRAY | Freq: Once | RESPIRATORY_TRACT | Status: AC
  Administered 2019-10-13: 2 via RESPIRATORY_TRACT
  Filled 2019-10-13: qty 6.7

## 2019-10-13 MED ORDER — IBUPROFEN 400 MG PO TABS
400.0000 mg | ORAL_TABLET | Freq: Once | ORAL | Status: AC | PRN
  Administered 2019-10-13: 400 mg via ORAL
  Filled 2019-10-13: qty 1

## 2019-10-13 MED ORDER — POTASSIUM CHLORIDE CRYS ER 20 MEQ PO TBCR
40.0000 meq | EXTENDED_RELEASE_TABLET | Freq: Once | ORAL | Status: AC
  Administered 2019-10-14: 40 meq via ORAL
  Filled 2019-10-13: qty 2

## 2019-10-13 MED ORDER — ACETAMINOPHEN 325 MG PO TABS: 650.0000 mg | ORAL_TABLET | Freq: Once | ORAL | Status: DC

## 2019-10-13 NOTE — ED Triage Notes (Signed)
Pt arrives via [facility] from home with c/c of cough sob and fatigue for 5 days. Pt had room air sat 89%-93% with ems on arrival to ED sat's 94% but feels sob and has constant cough.

## 2019-10-13 NOTE — ED Notes (Signed)
Pt. Ambulated in room. Her respirations slightly increased when she walked  However, she remained 92-96% on room air. She stated that her lungs hurt a little when she breathed in but clarified that the pain in her lungs was the same whether she was sitting or walking. She described the pain as tightness that is intermittent. Similiar to the feeling when she had pneumonia in the past

## 2019-10-13 NOTE — ED Provider Notes (Signed)
[facility] EMERGENCY DEPARTMENT Provider Note   CSN: [id number] Arrival date & time: 10/13/19  1626     History Chief Complaint  Patient presents with  . Cough  . Fatigue    Kristina Huffman is a 59 y.o. female.  HPI   59 year old female with a history of anxiety, asthma, bipolar disorder, depression, diabetes, EtOH abuse, hep C, heroin abuse, history of MRSA infection, hypertension, who presents emergency department today for evaluation of URI symptoms.  Patient states that she has had a cough, shortness of breath and fatigue for the last 5 days.  She also reports some diarrhea and vomiting.  She last vomited about 1 hour ago but has been in full to tolerate Sprite since this occurred.  She has some upper abdominal pain that occurs only when she vomits.  She is also had some fevers at home.  Patient presenting via [facility].  Apparently on their arrival her sats were at 89 to 93% on room air.  On arrival to the ED sats were 90 to 94% on room air.  Past Medical History:  Diagnosis Date  . Anxiety   . Asthma   . Bipolar disorder ([place])   . Depression   . Diabetes mellitus without complication ([place])   . ETOH abuse   . Hepatitis C   . Heroin abuse ([place])   . History of MRSA infection    legs and spread to face  . Hypertension     Patient Active Problem List   Diagnosis Date Noted  . Alcohol use disorder, severe, dependence ([place]) 06/06/2018  . Chronic hepatitis C without hepatic coma ([place]) 06/04/2016  . Substance induced mood disorder ([place]) 09/20/2013  . Alcohol dependency ([place]) 11/11/2011    Class: Acute  . Benzodiazepine dependence ([place]) 09/21/2011  . Bipolar 1 disorder, mixed, moderate ([place]) 09/21/2011  . 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 PTSD (post-traumatic stress disorder) 09/21/2011  . Homeless 09/21/2011    Past Surgical History:  Procedure Laterality Date  . BACK SURGERY    . RADIAL HEAD ARTHROPLASTY  08/09/2012   Procedure: RADIAL HEAD  ARTHROPLASTY;  Surgeon: Schuyler Amor, MD;  Location: [place];  Service: Orthopedics;  Laterality: Left;  Left Radial head Replacement     OB History    Gravida  5   Para      Term      Preterm      AB  1   Living        SAB  1   TAB      Ectopic      Multiple      Live Births  4           Family History  Problem Relation Age of Onset  . Depression Mother   . Osteoporosis Mother   . COPD Mother   . Diabetes Father   . Hypertension Father   . Congestive Heart Failure Father   . Aneurysm Father     Social History   Tobacco Use  . Smoking status: Never Smoker  . Smokeless tobacco: Never Used  Substance Use Topics  . Alcohol use: Yes    Comment: sometimes  . Drug use: No    Comment: no drug use for 3 years    Home Medications Prior to Admission medications   Medication Sig Start Date End Date Taking? Authorizing Provider  albuterol (PROVENTIL HFA;VENTOLIN HFA) 108 (90 Base) MCG/ACT inhaler Inhale 2 puffs into the lungs every 6 (  six) hours as needed for wheezing. 06/09/18   Lindell Spar I, NP  atorvastatin (LIPITOR) 10 MG tablet Take 1 tablet (10 mg total) by mouth at bedtime. For high cholesterol 06/09/18   Lindell Spar I, NP  clotrimazole (LOTRIMIN) 1 % cream Apply to affected area 2 times daily 05/03/19   Sharion Balloon, NP  docusate sodium (COLACE) 100 MG capsule Take 1 capsule (100 mg total) by mouth daily as needed for mild constipation. (May buy from over the counter): For constipation Patient taking differently: Take 100 mg by mouth every evening. (May buy from over the counter): For constipation 06/09/18   Lindell Spar I, NP  FLUoxetine (PROZAC) 40 MG capsule Take 1 capsule (40 mg total) by mouth daily. For depression Patient taking differently: Take 80 mg by mouth daily. For depression 06/10/18   Lindell Spar I, NP  gabapentin (NEURONTIN) 400 MG capsule Take 1 capsule (400 mg total) by mouth 4 (four) times daily. For agitation 06/09/18   Lindell Spar  I, NP  hydrochlorothiazide (HYDRODIURIL) 25 MG tablet Take 1 tablet (25 mg total) by mouth daily. For high blood pressure 06/10/18   Lindell Spar I, NP  hydrOXYzine (ATARAX/VISTARIL) 25 MG tablet Take 1 tablet (25 mg) by mouth four times daily as needed: For anxiety Patient not taking: Reported on 10/19/2018 06/09/18   Lindell Spar I, NP  ketoconazole (NIZORAL) 2 % cream Apply 1 application topically daily as needed for irritation.    [provider]  metFORMIN (GLUCOPHAGE) 500 MG tablet Take 1 tablet (500 mg total) by mouth 2 (two) times daily with a meal. For diabetes management 06/09/18   Lindell Spar I, NP  MITIGARE 0.6 MG CAPS Take 1 tablet by mouth daily. 07/04/19   [provider]  ondansetron (ZOFRAN) 4 MG tablet Take 4 mg by mouth every 8 (eight) hours as needed for nausea or vomiting.    [provider]  pantoprazole (PROTONIX) 40 MG tablet Take 40 mg by mouth daily as needed (heartburn).     [provider]  QUEtiapine (SEROQUEL) 400 MG tablet Take 400 mg by mouth at bedtime. 07/19/19   [provider]  traZODone (DESYREL) 50 MG tablet Take 1 tablet (50 mg total) by mouth at bedtime as needed for sleep. Patient not taking: Reported on 08/17/2018 06/09/18   Lindell Spar I, NP    Allergies    Sulfa antibiotics  Review of Systems   Review of Systems  Constitutional: Positive for chills, fatigue and fever.  HENT: Negative for ear pain and sore throat.   Eyes: Negative for visual disturbance.  Respiratory: Positive for cough and shortness of breath.   Cardiovascular: Negative for chest pain.  Gastrointestinal: Positive for abdominal pain (currently resolved), diarrhea, nausea and vomiting. Negative for constipation.  Genitourinary: Negative for dysuria and hematuria.  Musculoskeletal: Positive for myalgias.  Skin: Negative for rash.  Neurological: Positive for headaches.  All other systems reviewed and are negative.   Physical  Exam Updated Vital Signs BP (!) 138/92 (BP Location: Left Arm)   Pulse 72   Temp 98.5 F (36.9 C) (Oral)   Resp 18   LMP 08/02/2013 Comment: irregular  SpO2 90%   Physical Exam Vitals and nursing note reviewed.  Constitutional:      General: She is not in acute distress.    Appearance: She is well-developed. She is obese.  HENT:     Head: Normocephalic and atraumatic.  Eyes:     Conjunctiva/sclera: Conjunctivae normal.  Cardiovascular:     Rate and Rhythm: Normal rate and regular rhythm.     Heart sounds: Normal heart sounds. No murmur.  Pulmonary:     Effort: Pulmonary effort is normal. No respiratory distress.     Breath sounds: Normal breath sounds. No wheezing, rhonchi or rales.     Comments: Speaking in full sentences Abdominal:     General: Bowel sounds are normal.     Palpations: Abdomen is soft.     Tenderness: There is no abdominal tenderness. There is no guarding or rebound.  Musculoskeletal:     Cervical back: Neck supple.  Skin:    General: Skin is warm and dry.  Neurological:     Mental Status: She is alert.     ED Results / Procedures / Treatments   Labs (all labs ordered are listed, but only abnormal results are displayed) Labs Reviewed  CBC WITH DIFFERENTIAL/PLATELET - Abnormal; Notable for the following components:      Result Value   WBC 2.5 (*)    Neutro Abs 1.3 (*)    All other components within normal limits  COMPREHENSIVE METABOLIC PANEL - Abnormal; Notable for the following components:   Potassium 3.2 (*)    Glucose, Bld 135 (*)    Creatinine, Ser 1.08 (*)    Calcium 8.7 (*)    AST 98 (*)    ALT 87 (*)    GFR calc non Af Amer 57 (*)    All other components within normal limits  POC SARS CORONAVIRUS 2 AG -  ED - Abnormal; Notable for the following components:   SARS Coronavirus 2 Ag POSITIVE (*)    All other components within normal limits  LIPASE, BLOOD    EKG None  Radiology DG Chest Portable 1 View  Result Date:  10/13/2019 CLINICAL DATA:  Pt arrives via [facility] from home with c/c of cough sob and fatigue for 5 days. Pt had room air sat 89%-93% with ems on arrival to ED sat's 94% but feels sob and has constant cough. Pt is covid+ EXAM: PORTABLE CHEST 1 VIEW COMPARISON:  10/12/2017. FINDINGS: There are linear opacities noted in the right mid lung and in the left lung base, new since the prior exams. Remainder of the lungs is clear. No pleural effusion or pneumothorax. Cardiac silhouette is normal in size. No mediastinal or hilar masses. Skeletal structures are grossly intact. IMPRESSION: Linear right mid lung and left lower lung opacities that are likely due to atelectasis. Infection is also possible as a component, particularly given the patient's history. Electronically Signed   By: Lajean Manes M.D.   On: 10/13/2019 19:52    Procedures Procedures (including critical care time)  Medications Ordered in ED Medications  albuterol (VENTOLIN HFA) 108 (90 Base) MCG/ACT inhaler 2 puff (has no administration in time range)  ibuprofen (ADVIL) tablet 600 mg (has no administration in time range)  potassium chloride SA (KLOR-CON) CR tablet 40 mEq (has no administration in time range)  dexamethasone (DECADRON) injection 6 mg (has no administration in time range)  ibuprofen (ADVIL) tablet 400 mg (400 mg Oral Given 10/13/19 1653)    ED Course  I have reviewed the triage vital signs and the nursing notes.  Pertinent labs & imaging results that were available during my care of the patient were reviewed by me and considered in my medical decision making (see chart for details).    MDM Rules/Calculators/A&P  59 year old female presenting for evaluation of cough, fevers, fatigue, body aches and shortness of breath for the last 5 days.  Came by EMS who reportedly noted her sats to be at 89 to 92% however on arrival to the ED sats are at 94%.  Her vital signs showed some tachypnea and a fever.  She was  given antipyretics and fever and respiratory rate improved.  Covid testing is positive CBC with leukopenia which would be expected in setting of covid, no anemia CMP with mild hypokalemia, liver enzymes elevated but only slightly worse from baseline Lipase wnl  Chest x-ray with linear right mid lung and left lower lung opacities that are likely due to atelectasis. Infection is also possible as a component, particularly given the patient's history.  Patient was ambulated in the room with pulse ox and sats remained at 92% to 96% on room air.  Patient has been able to tolerate p.o. in the ED.  Pt was observed in the Ed for several hours while awaiting labs. Throughout her stay her oxygen has intermittently dropped into the mid 80s and stayed around 91-92%. She was placed on 2L and feels better with this. Given she is earlier in the course of COVID and is already experiencing intermittent hypoxia, I feel she would benefit from admission for further intervention and management.   11:28 PM CONSULT with Dr. Hal Hope who accepts patient for admission  Final Clinical Impression(s) / ED Diagnoses Final diagnoses:  Acute respiratory failure with hypoxia [facility])  COVID-19    Rx / DC Orders ED Discharge Orders    None       Bishop [place] 10/13/19 2329    Valarie Merino, MD 10/14/19 [phone]

## 2019-10-14 ENCOUNTER — Encounter (HOSPITAL_COMMUNITY): Payer: Self-pay | Admitting: Internal Medicine

## 2019-10-14 ENCOUNTER — Other Ambulatory Visit: Payer: Self-pay

## 2019-10-14 DIAGNOSIS — F1010 Alcohol abuse, uncomplicated: Secondary | ICD-10-CM | POA: Diagnosis present

## 2019-10-14 DIAGNOSIS — Z8262 Family history of osteoporosis: Secondary | ICD-10-CM | POA: Diagnosis not present

## 2019-10-14 DIAGNOSIS — Z6835 Body mass index (BMI) 35.0-35.9, adult: Secondary | ICD-10-CM | POA: Diagnosis not present

## 2019-10-14 DIAGNOSIS — F3162 Bipolar disorder, current episode mixed, moderate: Secondary | ICD-10-CM | POA: Diagnosis present

## 2019-10-14 DIAGNOSIS — Z818 Family history of other mental and behavioral disorders: Secondary | ICD-10-CM | POA: Diagnosis not present

## 2019-10-14 DIAGNOSIS — I10 Essential (primary) hypertension: Secondary | ICD-10-CM | POA: Diagnosis present

## 2019-10-14 DIAGNOSIS — J9601 Acute respiratory failure with hypoxia: Secondary | ICD-10-CM | POA: Diagnosis present

## 2019-10-14 DIAGNOSIS — N179 Acute kidney failure, unspecified: Secondary | ICD-10-CM

## 2019-10-14 DIAGNOSIS — B182 Chronic viral hepatitis C: Secondary | ICD-10-CM | POA: Diagnosis present

## 2019-10-14 DIAGNOSIS — G4733 Obstructive sleep apnea (adult) (pediatric): Secondary | ICD-10-CM | POA: Diagnosis present

## 2019-10-14 DIAGNOSIS — E876 Hypokalemia: Secondary | ICD-10-CM | POA: Diagnosis present

## 2019-10-14 DIAGNOSIS — E860 Dehydration: Secondary | ICD-10-CM | POA: Diagnosis present

## 2019-10-14 DIAGNOSIS — U071 COVID-19: Secondary | ICD-10-CM | POA: Diagnosis present

## 2019-10-14 DIAGNOSIS — F419 Anxiety disorder, unspecified: Secondary | ICD-10-CM | POA: Diagnosis present

## 2019-10-14 DIAGNOSIS — J9600 Acute respiratory failure, unspecified whether with hypoxia or hypercapnia: Secondary | ICD-10-CM

## 2019-10-14 DIAGNOSIS — G629 Polyneuropathy, unspecified: Secondary | ICD-10-CM | POA: Diagnosis present

## 2019-10-14 DIAGNOSIS — J1282 Pneumonia due to coronavirus disease 2019: Secondary | ICD-10-CM | POA: Diagnosis present

## 2019-10-14 DIAGNOSIS — J45909 Unspecified asthma, uncomplicated: Secondary | ICD-10-CM | POA: Diagnosis present

## 2019-10-14 DIAGNOSIS — F1110 Opioid abuse, uncomplicated: Secondary | ICD-10-CM | POA: Diagnosis present

## 2019-10-14 DIAGNOSIS — E669 Obesity, unspecified: Secondary | ICD-10-CM | POA: Diagnosis present

## 2019-10-14 DIAGNOSIS — E1165 Type 2 diabetes mellitus with hyperglycemia: Secondary | ICD-10-CM

## 2019-10-14 DIAGNOSIS — B1920 Unspecified viral hepatitis C without hepatic coma: Secondary | ICD-10-CM | POA: Diagnosis present

## 2019-10-14 DIAGNOSIS — Z825 Family history of asthma and other chronic lower respiratory diseases: Secondary | ICD-10-CM | POA: Diagnosis not present

## 2019-10-14 DIAGNOSIS — Z833 Family history of diabetes mellitus: Secondary | ICD-10-CM | POA: Diagnosis not present

## 2019-10-14 DIAGNOSIS — Z8249 Family history of ischemic heart disease and other diseases of the circulatory system: Secondary | ICD-10-CM | POA: Diagnosis not present

## 2019-10-14 LAB — CBC WITH DIFFERENTIAL/PLATELET
Abs Immature Granulocytes: 0.01 K/uL (ref 0.00–0.07)
Basophils Absolute: 0 K/uL (ref 0.0–0.1)
Basophils Relative: 0 %
Eosinophils Absolute: 0 K/uL (ref 0.0–0.5)
Eosinophils Relative: 0 %
HCT: 42.2 % (ref 36.0–46.0)
Hemoglobin: 13.8 g/dL (ref 12.0–15.0)
Immature Granulocytes: 0 %
Lymphocytes Relative: 15 %
Lymphs Abs: 0.4 K/uL — ABNORMAL LOW (ref 0.7–4.0)
MCH: 30.5 pg (ref 26.0–34.0)
MCHC: 32.7 g/dL (ref 30.0–36.0)
MCV: 93.4 fL (ref 80.0–100.0)
Monocytes Absolute: 0.1 K/uL (ref 0.1–1.0)
Monocytes Relative: 4 %
Neutro Abs: 2.2 K/uL (ref 1.7–7.7)
Neutrophils Relative %: 81 %
Platelets: 176 K/uL (ref 150–400)
RBC: 4.52 MIL/uL (ref 3.87–5.11)
RDW: 13.2 % (ref 11.5–15.5)
WBC: 2.7 K/uL — ABNORMAL LOW (ref 4.0–10.5)
nRBC: 0 % (ref 0.0–0.2)

## 2019-10-14 LAB — TROPONIN I (HIGH SENSITIVITY): Troponin I (High Sensitivity): 3 ng/L (ref <18)

## 2019-10-14 LAB — D-DIMER, QUANTITATIVE
D-Dimer, Quant: <0.27 ug/mL-FEU (ref 0.00–0.50)
D-Dimer, Quant: 0.27 ug{FEU}/mL (ref 0.00–0.50)

## 2019-10-14 LAB — COMPREHENSIVE METABOLIC PANEL WITH GFR
ALT: 89 U/L — ABNORMAL HIGH (ref 0–44)
AST: 106 U/L — ABNORMAL HIGH (ref 15–41)
Albumin: 3.8 g/dL (ref 3.5–5.0)
Alkaline Phosphatase: 90 U/L (ref 38–126)
Anion gap: 12 (ref 5–15)
BUN: 19 mg/dL (ref 6–20)
CO2: 25 mmol/L (ref 22–32)
Calcium: 8.6 mg/dL — ABNORMAL LOW (ref 8.9–10.3)
Chloride: 101 mmol/L (ref 98–111)
Creatinine, Ser: 0.8 mg/dL (ref 0.44–1.00)
GFR calc Af Amer: >60 mL/min
GFR calc non Af Amer: >60 mL/min
Glucose, Bld: 219 mg/dL — ABNORMAL HIGH (ref 70–99)
Potassium: 3.7 mmol/L (ref 3.5–5.1)
Sodium: 138 mmol/L (ref 135–145)
Total Bilirubin: 0.7 mg/dL (ref 0.3–1.2)
Total Protein: 7.4 g/dL (ref 6.5–8.1)

## 2019-10-14 LAB — SAMPLE TO BLOOD BANK

## 2019-10-14 LAB — GLUCOSE, CAPILLARY
Glucose-Capillary: 235 mg/dL — ABNORMAL HIGH (ref 70–99)
Glucose-Capillary: 144 mg/dL — ABNORMAL HIGH (ref 70–99)
Glucose-Capillary: 153 mg/dL — ABNORMAL HIGH (ref 70–99)
Glucose-Capillary: 178 mg/dL — ABNORMAL HIGH (ref 70–99)

## 2019-10-14 LAB — FERRITIN
Ferritin: 461 ng/mL — ABNORMAL HIGH (ref 11–307)
Ferritin: 514 ng/mL — ABNORMAL HIGH (ref 11–307)

## 2019-10-14 LAB — C-REACTIVE PROTEIN
CRP: 4.3 mg/dL — ABNORMAL HIGH (ref <1.0)
CRP: 5 mg/dL — ABNORMAL HIGH

## 2019-10-14 LAB — PROCALCITONIN: Procalcitonin: <0.1 ng/mL

## 2019-10-14 MED ORDER — SODIUM CHLORIDE 0.9 % IV SOLN
100.0000 mg | Freq: Every day | INTRAVENOUS | Status: DC
  Administered 2019-10-15 – 2019-10-17 (×3): 100 mg via INTRAVENOUS
  Filled 2019-10-14 (×3): qty 20

## 2019-10-14 MED ORDER — POTASSIUM CHLORIDE CRYS ER 20 MEQ PO TBCR: 20.0000 meq | EXTENDED_RELEASE_TABLET | Freq: Once | ORAL | Status: DC

## 2019-10-14 MED ORDER — DOCUSATE SODIUM 100 MG PO CAPS: 100.0000 mg | ORAL_CAPSULE | Freq: Every evening | ORAL | Status: DC

## 2019-10-14 MED ORDER — ACETAMINOPHEN 650 MG RE SUPP: 650.0000 mg | Freq: Four times a day (QID) | RECTAL | Status: DC | PRN

## 2019-10-14 MED ORDER — ONDANSETRON HCL 4 MG/2ML IJ SOLN: 4.0000 mg | Freq: Four times a day (QID) | INTRAMUSCULAR | Status: DC | PRN

## 2019-10-14 MED ORDER — ACETAMINOPHEN 325 MG PO TABS
650.0000 mg | ORAL_TABLET | Freq: Four times a day (QID) | ORAL | Status: DC | PRN
  Administered 2019-10-14: 650 mg via ORAL
  Filled 2019-10-14: qty 2

## 2019-10-14 MED ORDER — GUAIFENESIN-DM 100-10 MG/5ML PO SYRP: 10.0000 mL | ORAL_SOLUTION | ORAL | Status: DC | PRN

## 2019-10-14 MED ORDER — FLUOXETINE HCL 20 MG PO CAPS
80.0000 mg | ORAL_CAPSULE | Freq: Every day | ORAL | Status: DC
  Administered 2019-10-14 – 2019-10-17 (×4): 80 mg via ORAL
  Filled 2019-10-14 (×4): qty 4

## 2019-10-14 MED ORDER — ENSURE MAX PROTEIN PO LIQD
11.0000 [oz_av] | Freq: Two times a day (BID) | ORAL | Status: DC
  Administered 2019-10-14 – 2019-10-17 (×6): 11 [oz_av] via ORAL
  Filled 2019-10-14 (×9): qty 330

## 2019-10-14 MED ORDER — ENOXAPARIN SODIUM 40 MG/0.4ML ~~LOC~~ SOLN
40.0000 mg | SUBCUTANEOUS | Status: DC
  Administered 2019-10-14 – 2019-10-15 (×2): 40 mg via SUBCUTANEOUS
  Filled 2019-10-14 (×3): qty 0.4

## 2019-10-14 MED ORDER — QUETIAPINE FUMARATE 400 MG PO TABS
400.0000 mg | ORAL_TABLET | Freq: Every day | ORAL | Status: DC
  Administered 2019-10-14 – 2019-10-16 (×3): 400 mg via ORAL
  Filled 2019-10-14 (×5): qty 1

## 2019-10-14 MED ORDER — DEXAMETHASONE SODIUM PHOSPHATE 10 MG/ML IJ SOLN
6.0000 mg | INTRAMUSCULAR | Status: DC
  Administered 2019-10-14 – 2019-10-17 (×4): 6 mg via INTRAVENOUS
  Filled 2019-10-14 (×4): qty 1

## 2019-10-14 MED ORDER — ADULT MULTIVITAMIN W/MINERALS CH
1.0000 | ORAL_TABLET | Freq: Every day | ORAL | Status: DC
  Administered 2019-10-14 – 2019-10-17 (×4): 1 via ORAL
  Filled 2019-10-14 (×4): qty 1

## 2019-10-14 MED ORDER — ALBUTEROL SULFATE HFA 108 (90 BASE) MCG/ACT IN AERS: 2.0000 | INHALATION_SPRAY | Freq: Four times a day (QID) | RESPIRATORY_TRACT | Status: DC | PRN

## 2019-10-14 MED ORDER — ATORVASTATIN CALCIUM 10 MG PO TABS
10.0000 mg | ORAL_TABLET | Freq: Every day | ORAL | Status: DC
  Administered 2019-10-14 – 2019-10-16 (×3): 10 mg via ORAL
  Filled 2019-10-14 (×3): qty 1

## 2019-10-14 MED ORDER — INSULIN ASPART 100 UNIT/ML ~~LOC~~ SOLN
0.0000 [IU] | Freq: Three times a day (TID) | SUBCUTANEOUS | Status: DC
  Administered 2019-10-14: 1 [IU] via SUBCUTANEOUS
  Administered 2019-10-14: 3 [IU] via SUBCUTANEOUS
  Administered 2019-10-14 – 2019-10-15 (×2): 2 [IU] via SUBCUTANEOUS

## 2019-10-14 MED ORDER — GABAPENTIN 300 MG PO CAPS
400.0000 mg | ORAL_CAPSULE | Freq: Four times a day (QID) | ORAL | Status: DC
  Filled 2019-10-14: qty 1

## 2019-10-14 MED ORDER — SODIUM CHLORIDE 0.9 % IV SOLN
200.0000 mg | Freq: Once | INTRAVENOUS | Status: AC
  Administered 2019-10-14: 200 mg via INTRAVENOUS
  Filled 2019-10-14: qty 200

## 2019-10-14 MED ORDER — HYDRALAZINE HCL 20 MG/ML IJ SOLN: 10.0000 mg | INTRAMUSCULAR | Status: DC | PRN

## 2019-10-14 MED ORDER — GABAPENTIN 300 MG PO CAPS: 400.0000 mg | ORAL_CAPSULE | Freq: Four times a day (QID) | ORAL | Status: DC

## 2019-10-14 MED ORDER — ONDANSETRON HCL 4 MG PO TABS: 4.0000 mg | ORAL_TABLET | Freq: Four times a day (QID) | ORAL | Status: DC | PRN

## 2019-10-14 MED ORDER — HYDROCOD POLST-CPM POLST ER 10-8 MG/5ML PO SUER
5.0000 mL | Freq: Two times a day (BID) | ORAL | Status: DC | PRN
  Administered 2019-10-14: 5 mL via ORAL
  Filled 2019-10-14: qty 5

## 2019-10-14 MED ORDER — GABAPENTIN 300 MG PO CAPS
400.0000 mg | ORAL_CAPSULE | Freq: Four times a day (QID) | ORAL | Status: DC
  Administered 2019-10-14 – 2019-10-17 (×13): 400 mg via ORAL
  Filled 2019-10-14 (×13): qty 1

## 2019-10-14 NOTE — Progress Notes (Signed)
Patient just arrived in room by EMS

## 2019-10-14 NOTE — Progress Notes (Signed)
Initial Nutrition Assessment RD working remotely.  DOCUMENTATION CODES:   Obesity unspecified  INTERVENTION:   Ensure Max po BID, each supplement provides 150 kcal and 30 grams of protein.   MVI daily.  Magic cup BID with lunch and dinner, each supplement provides 290 kcal and 9 grams of protein.   NUTRITION DIAGNOSIS:   Increased nutrient needs related to catabolic illness(COVID) as evidenced by estimated needs.  GOAL:   Patient will meet greater than or equal to 90% of their needs  MONITOR:   PO intake, Supplement acceptance, Weight trends, Labs  REASON FOR ASSESSMENT:   Malnutrition Screening Tool    ASSESSMENT:   59 yo female admitted with SOB, PNA, COVID positive on admission. PMH includes asthma, hepatitis C, heroin abuse, ETOH abuse, HTN, DM, Bipolar D/O.   Patient reported weight loss and poor intake related to poor appetite on admission. Suspect this is related to acute illness with associated GI symptoms. Patient would benefit from PO supplements to ensure adequate intake to meet increased nutrition needs for COVID-19.  Labs reviewed.  CBG's: 235  Medications reviewed and include decadron, colace, novolog, remdesivir.  Weight encounters reviewed. Patient weighed 117.9 kg in March 2019, no more recent weight available. Currently 100.5 kg.  NUTRITION - FOCUSED PHYSICAL EXAM:  unable to complete due to COVID restrictions  Diet Order:   Diet Order            Diet heart healthy/carb modified Room service appropriate? Yes; Fluid consistency: Thin  Diet effective now              EDUCATION NEEDS:   Not appropriate for education at this time  Skin:  Skin Assessment: Reviewed RN Assessment  Last BM:  1/22 type 7  Height:   Ht Readings from Last 1 Encounters:  10/14/19 5\' 6"  (1.676 m)    Weight:   Wt Readings from Last 1 Encounters:  10/14/19 100.5 kg    Ideal Body Weight:  59.1 kg  BMI:  Body mass index is 35.76 kg/m.  Estimated  Nutritional Needs:   Kcal:  [id number]  Protein:  100-110 gm  Fluid:  >/= 1.8 L    Molli Barrows, RD, LDN, [place] Pager [phone] After Hours Pager [phone]

## 2019-10-14 NOTE — H&P (Signed)
History and Physical    Kristina Huffman [id number] DOB: 05/26/1961 DOA: 10/13/2019  PCP: Audley Hose, MD  Patient coming from: Home.  Chief Complaint: Shortness of breath.  HPI: Kristina Huffman is a 59 y.o. female with history of diabetes mellitus type 2, hypertension, bipolar disorder EtOH abuse presents to the ER with complaint of shortness of breath.  Patient states that over the last 4 days patient has having some nausea vomiting and last 2 days have been increasing shortness of breath with nonproductive cough.  Denies any chest pain.  Also had some diarrhea yesterday.  Given the worsening shortness of breath EMS was called patient was found to be hypoxic and had to be placed on 2 L oxygen and was brought to the ER.  ED Course: In the ER patient was descending off and on.  Chest x-ray shows bilateral infiltrates fevers febrile with temperature 102 F.  Covid test was positive.  Labs show creatinine 1.08 potassium 3.2 elevated AST and ALT troponin III ferritin 461 CRP 4.3 WBC count 2.5 EKG shows normal sinus rhythm with nonspecific changes.  Patient started on IV remdesivir and Decadron admitted for Covid pneumonia.  Potassium replacement given.  Review of Systems: As per HPI, rest all negative.   Past Medical History:  Diagnosis Date  . Anxiety   . Asthma   . Bipolar disorder ([place])   . Depression   . Diabetes mellitus without complication ([place])   . ETOH abuse   . Hepatitis C   . Heroin abuse ([place])   . History of MRSA infection    legs and spread to face  . Hypertension     Past Surgical History:  Procedure Laterality Date  . BACK SURGERY    . RADIAL HEAD ARTHROPLASTY  08/09/2012   Procedure: RADIAL HEAD ARTHROPLASTY;  Surgeon: Schuyler Amor, MD;  Location: [facility];  Service: Orthopedics;  Laterality: Left;  Left Radial head Replacement     reports that she has never smoked. She has never used smokeless tobacco. She reports current alcohol use. She reports  that she does not use drugs.  Allergies  Allergen Reactions  . Sulfa Antibiotics Shortness Of Breath and Swelling    Tight in throat    Family History  Problem Relation Age of Onset  . Depression Mother   . Osteoporosis Mother   . COPD Mother   . Diabetes Father   . Hypertension Father   . Congestive Heart Failure Father   . Aneurysm Father     Prior to Admission medications   Medication Sig Start Date End Date Taking? Authorizing Provider  albuterol (PROVENTIL HFA;VENTOLIN HFA) 108 (90 Base) MCG/ACT inhaler Inhale 2 puffs into the lungs every 6 (six) hours as needed for wheezing. 06/09/18   Lindell Spar I, NP  atorvastatin (LIPITOR) 10 MG tablet Take 1 tablet (10 mg total) by mouth at bedtime. For high cholesterol 06/09/18   Lindell Spar I, NP  clotrimazole (LOTRIMIN) 1 % cream Apply to affected area 2 times daily 05/03/19   Sharion Balloon, NP  docusate sodium (COLACE) 100 MG capsule Take 1 capsule (100 mg total) by mouth daily as needed for mild constipation. (May buy from over the counter): For constipation Patient taking differently: Take 100 mg by mouth every evening. (May buy from over the counter): For constipation 06/09/18   Lindell Spar I, NP  FLUoxetine (PROZAC) 40 MG capsule Take 1 capsule (40 mg total) by mouth daily. For depression Patient taking differently:  Take 80 mg by mouth daily. For depression 06/10/18   Lindell Spar I, NP  gabapentin (NEURONTIN) 400 MG capsule Take 1 capsule (400 mg total) by mouth 4 (four) times daily. For agitation 06/09/18   Lindell Spar I, NP  hydrochlorothiazide (HYDRODIURIL) 25 MG tablet Take 1 tablet (25 mg total) by mouth daily. For high blood pressure 06/10/18   Lindell Spar I, NP  hydrOXYzine (ATARAX/VISTARIL) 25 MG tablet Take 1 tablet (25 mg) by mouth four times daily as needed: For anxiety Patient not taking: Reported on 10/19/2018 06/09/18   Lindell Spar I, NP  ketoconazole (NIZORAL) 2 % cream Apply 1 application topically daily as needed for  irritation.    [provider]  metFORMIN (GLUCOPHAGE) 500 MG tablet Take 1 tablet (500 mg total) by mouth 2 (two) times daily with a meal. For diabetes management 06/09/18   Lindell Spar I, NP  MITIGARE 0.6 MG CAPS Take 1 tablet by mouth daily. 07/04/19   [provider]  ondansetron (ZOFRAN) 4 MG tablet Take 4 mg by mouth every 8 (eight) hours as needed for nausea or vomiting.    [provider]  pantoprazole (PROTONIX) 40 MG tablet Take 40 mg by mouth daily as needed (heartburn).     [provider]  QUEtiapine (SEROQUEL) 400 MG tablet Take 400 mg by mouth at bedtime. 07/19/19   [provider]  traZODone (DESYREL) 50 MG tablet Take 1 tablet (50 mg total) by mouth at bedtime as needed for sleep. Patient not taking: Reported on 08/17/2018 06/09/18   Encarnacion Slates, NP    Physical Exam: Constitutional: Moderately built and nourished. Vitals:   10/14/19 0045 10/14/19 0100 10/14/19 0145 10/14/19 0215  BP: 136/88 123/81 133/80 133/83  Pulse: 73 89 80 70  Resp: (!) 22  (!) 25 (!) 26  Temp:      TempSrc:      SpO2: 98% 92% 90% 92%   Eyes: Anicteric no pallor. ENMT: No discharge from the ears eyes nose or mouth. Neck: No mass felt.  No neck rigidity. Respiratory: No rhonchi or crepitations. Cardiovascular: S1-S2 heard. Abdomen: Soft nontender bowel sound present. Musculoskeletal: No edema. Skin: No rash. Neurologic: Alert awake oriented time place and person.  Moves all extremities. Psychiatric: Appears normal per normal affect.   Labs on Admission: I have personally reviewed following labs and imaging studies  CBC: Recent Labs  Lab 10/13/19 2216  WBC 2.5*  NEUTROABS 1.3*  HGB 14.4  HCT 42.5  MCV 92.6  PLT 123XX123   Basic Metabolic Panel: Recent Labs  Lab 10/13/19 2216  NA 137  K 3.2*  CL 98  CO2 27  GLUCOSE 135*  BUN 14  CREATININE 1.08*  CALCIUM 8.7*   GFR: CrCl cannot be calculated (Unknown ideal weight.). Liver  Function Tests: Recent Labs  Lab 10/13/19 2216  AST 98*  ALT 87*  ALKPHOS 90  BILITOT 0.7  PROT 6.8  ALBUMIN 3.6   Recent Labs  Lab 10/13/19 2216  LIPASE 39   No results for input(s): AMMONIA in the last 168 hours. Coagulation Profile: No results for input(s): INR, PROTIME in the last 168 hours. Cardiac Enzymes: No results for input(s): CKTOTAL, CKMB, CKMBINDEX, TROPONINI in the last 168 hours. BNP (last 3 results) No results for input(s): PROBNP in the last 8760 hours. HbA1C: No results for input(s): HGBA1C in the last 72 hours. CBG: No results for input(s): GLUCAP in the last 168 hours. Lipid Profile: No results for input(s):  CHOL, HDL, LDLCALC, TRIG, CHOLHDL, LDLDIRECT in the last 72 hours. Thyroid Function Tests: No results for input(s): TSH, T4TOTAL, FREET4, T3FREE, THYROIDAB in the last 72 hours. Anemia Panel: Recent Labs    10/14/19 0006  FERRITIN 461*   Urine analysis:    Component Value Date/Time   COLORURINE YELLOW 11/16/2018 0148   APPEARANCEUR HAZY (A) 11/16/2018 0148   LABSPEC 1.015 05/03/2019 1708   PHURINE 6.0 05/03/2019 1708   GLUCOSEU NEGATIVE 05/03/2019 1708   HGBUR TRACE (A) 05/03/2019 1708   BILIRUBINUR NEGATIVE 05/03/2019 1708   KETONESUR NEGATIVE 05/03/2019 1708   PROTEINUR NEGATIVE 05/03/2019 1708   UROBILINOGEN 0.2 05/03/2019 1708   NITRITE NEGATIVE 05/03/2019 1708   LEUKOCYTESUR MODERATE (A) 05/03/2019 1708   Sepsis Labs: @LABRCNTIP (procalcitonin:4,lacticidven:4) )No results found for this or any previous visit (from the past 240 hour(s)).   Radiological Exams on Admission: DG Chest Portable 1 View  Result Date: 10/13/2019 CLINICAL DATA:  Pt arrives via [facility] from home with c/c of cough sob and fatigue for 5 days. Pt had room air sat 89%-93% with ems on arrival to ED sat's 94% but feels sob and has constant cough. Pt is covid+ EXAM: PORTABLE CHEST 1 VIEW COMPARISON:  10/12/2017. FINDINGS: There are linear opacities noted in the  right mid lung and in the left lung base, new since the prior exams. Remainder of the lungs is clear. No pleural effusion or pneumothorax. Cardiac silhouette is normal in size. No mediastinal or hilar masses. Skeletal structures are grossly intact. IMPRESSION: Linear right mid lung and left lower lung opacities that are likely due to atelectasis. Infection is also possible as a component, particularly given the patient's history. Electronically Signed   By: Lajean Manes M.D.   On: 10/13/2019 19:52    EKG: Independently reviewed.  Normal sinus rhythm with nonspecific ST-T changes.  Assessment/Plan Principal Problem:   Acute respiratory failure due to COVID-19 [facility]) Active Problems:   Bipolar 1 disorder, mixed, moderate (HCC)   Chronic hepatitis C without hepatic coma (HCC)   ARF (acute renal failure) (HCC)   Essential hypertension   Controlled type 2 diabetes mellitus with hyperglycemia ([place])    1. Acute respiratory failure with hypoxia secondary to COVID-19 infection for which I have started patient on IV remdesivir and Decadron.  Will closely monitor for any further worsening of hypoxia or inflammatory markers. 2. Acute renal failure likely from poor oral intake vomiting and diarrhea and hydrochlorothiazide.  Will hold off hydrochlorothiazide patient received fluid in the ER follow metabolic panel. 3. Hypertension we will hold hydrochlorothiazide keep patient on IV hydralazine since patient appears dehydrated. 4. Diabetes mellitus type 2 we will keep patient on sliding scale coverage. 5. Bipolar disorder on Seroquel and fluoxetine. 6. Neuropathy on gabapentin. 7. Elevated LFTs with history of hepatitis C.  Abdomen appears benign on exam.  Closely monitor LFTs. 8. History of alcohol abuse patient states she has cut down alcohol for the last 6 months only drinks 2 or 3 drinks of wine over the week.  Closely monitor.  Given that patient has acute respiratory failure with hypoxia secondary to  COVID-19 infection will need inpatient status.   DVT prophylaxis: Lovenox. Code Status: Full code. Family Communication: Discussed with patient. Disposition Plan: Home. Consults called: None. Admission status: Inpatient.   Rise Patience MD [facility] Pager [phone].  If 7PM-7AM, please contact night-coverage [email or web address] Password [email or web address]  10/14/2019, 2:26 AM

## 2019-10-14 NOTE — Progress Notes (Signed)
Patient states that she is ordered 400 mg of Seroquel @ HS but only takes 200 mg d/t her sleep apnea. She states it sedates her to much. It makes her Cpap alarm d/t not breathing adequately.

## 2019-10-14 NOTE — Progress Notes (Signed)
PROGRESS NOTE                                                                                                                                                                                                             Patient Demographics:    Kristina Huffman, is a 59 y.o. female, DOB - [birth year], [id number]  Outpatient Primary MD for the patient is Audley Hose, MD   Admit date - 10/13/2019   LOS - 0  Chief Complaint  Patient presents with  . Cough  . Fatigue       Brief Narrative: Patient is a 59 y.o. female with PMHx of DM-2, HTN, bipolar disorder, EtOH abuse-who presented with shortness of breath-was found to have acute hypoxic respiratory failure secondary to COVID-19 pneumonia.  See below for further details.   Subjective:    Kristina Huffman today feels better-she was on 2 L of oxygen.  Continues to have some cough.   Assessment  & Plan :   Acute Hypoxic Resp Failure due to Covid 19 Viral pneumonia: Overall better-attempt to titrate off oxygen.  Continue steroids/remdesivir.  Fever: afebrile  O2 requirements:  SpO2: 93 % O2 Flow Rate (L/min): 1 L/min   COVID-19 Labs: Recent Labs    10/14/19 0006 10/14/19 0650  DDIMER <0.27 0.27  FERRITIN 461* 514*  CRP 4.3* 5.0*       Component Value Date/Time   BNP 3.4 03/18/2017 1004    Recent Labs  Lab 10/14/19 0006  PROCALCITON <0.10    No results found for: SARSCOV2NAA   COVID-19 Medications: Steroids: 1/21>> Remdesivir: 1/21>>  Other medications: Diuretics:Euvolemic-no need for lasix Antibiotics:Not needed as no evidence of bacterial infection  Prone/Incentive Spirometry: encouraged  incentive spirometry use 3-4/hour.  DVT Prophylaxis  :  Lovenox   AKI: Mild-likely hemodynamically mediated-resolved.  Hypokalemia: Likely secondary to HCTZ-resolved.  Transaminitis: Likely secondary to COVID-19-?  History of hepatitis C.  HTN:  BP controlled-HCTZ on hold.  Resume when able.  DM-2: Monitor CBGs on SSI.  Metformin on hold.  CBG (last 3)  Recent Labs    10/14/19 0720  GLUCAP 235*     Peripheral neuropathy: Suspect related to DM-on Neurontin.  Follow.  Bipolar disorder: Appears stable-continue Seroquel, fluoxetine and trazodone  History of alcohol abuse: Claims she has cut back and only drinks 2 to 3  glasses of wine in a week-monitor for signs of withdrawal.  Obesity: Estimated body mass index is 35.76 kg/m as calculated from the following:   Height as of this encounter: 5\' 6"  (1.676 m).   Weight as of this encounter: 100.5 kg.   Consults  :  None  Procedures  :  None  ABG:    Component Value Date/Time   TCO2 25 06/30/2013 0031    Vent Settings: N/A  Condition - Stable  Family Communication  : Left a voicemail for spouse  Code Status :  Full Code  Diet :  Diet Order            Diet heart healthy/carb modified Room service appropriate? Yes; Fluid consistency: Thin  Diet effective now               Disposition Plan  :  Remain hospitalized-Home in the next few days if clinical improvement continues.  Attempt to titrate off oxygen.  Barriers to discharge: Hypoxia requiring O2 supplementation/complete 5 days of IV Remdesivir  Antimicorbials  :    Anti-infectives (From admission, onward)   Start     Dose/Rate Route Frequency Ordered Stop   10/15/19 1000  remdesivir 100 mg in sodium chloride 0.9 % 100 mL IVPB     100 mg 200 mL/hr over 30 Minutes Intravenous Daily 10/14/19 0007 10/19/19 0959   10/14/19 0100  remdesivir 200 mg in sodium chloride 0.9% 250 mL IVPB     200 mg 580 mL/hr over 30 Minutes Intravenous Once 10/14/19 0007 10/14/19 0300      Inpatient Medications  Scheduled Meds: . atorvastatin  10 mg Oral QHS  . dexamethasone (DECADRON) injection  6 mg Intravenous Q24H  . docusate sodium  100 mg Oral QPM  . enoxaparin (LOVENOX) injection  40 mg Subcutaneous Q24H  .  FLUoxetine  80 mg Oral Daily  . gabapentin  400 mg Oral QID  . insulin aspart  0-9 Units Subcutaneous TID WC  . multivitamin with minerals  1 tablet Oral Daily  . Ensure Max Protein  11 oz Oral BID  . QUEtiapine  400 mg Oral QHS   Continuous Infusions: . [START ON 10/15/2019] remdesivir 100 mg in NS 100 mL     PRN Meds:.acetaminophen **OR** acetaminophen, albuterol, chlorpheniramine-HYDROcodone, guaiFENesin-dextromethorphan, hydrALAZINE, ondansetron **OR** ondansetron (ZOFRAN) IV   Time Spent in minutes  25  See all Orders from today for further details   Oren Binet M.D on 10/14/2019 at 12:11 PM  To page go to [email or web address] - use universal password  [facility] -  Office  [phone]    Objective:   Vitals:   10/14/19 0215 10/14/19 0317 10/14/19 0720 10/14/19 0938  BP: 133/83 115/87 110/72   Pulse: 70 71 62   Resp: (!) 26 (!) 22 17   Temp:  99.6 F (37.6 C) 98.5 F (36.9 C)   TempSrc:  Oral Oral   SpO2: 92% 95% 94% 93%  Weight: 108.9 kg 100.5 kg    Height: 5\' 6"  (1.676 m) 5\' 6"  (1.676 m)      Wt Readings from Last 3 Encounters:  10/14/19 100.5 kg  12/02/17 117.9 kg  10/12/17 113.4 kg     Intake/Output Summary (Last 24 hours) at 10/14/2019 1211 Last data filed at 10/14/2019 0500 Gross per 24 hour  Intake 750 ml  Output --  Net 750 ml     Physical Exam Gen Exam:Alert awake-not in any distress HEENT:atraumatic, normocephalic Chest: B/L clear to auscultation anteriorly  CVS:S1S2 regular Abdomen:soft non tender, non distended Extremities:no edema Neurology: Non focal Skin: no rash   Data Review:    CBC Recent Labs  Lab 10/13/19 2216 10/14/19 0650  WBC 2.5* 2.7*  HGB 14.4 13.8  HCT 42.5 42.2  PLT 170 176  MCV 92.6 93.4  MCH 31.4 30.5  MCHC 33.9 32.7  RDW 13.1 13.2  LYMPHSABS 1.0 0.4*  MONOABS 0.2 0.1  EOSABS 0.0 0.0  BASOSABS 0.0 0.0    Chemistries  Recent Labs  Lab 10/13/19 2216 10/14/19 0650  NA 137 138  K 3.2* 3.7  CL  98 101  CO2 27 25  GLUCOSE 135* 219*  BUN 14 19  CREATININE 1.08* 0.80  CALCIUM 8.7* 8.6*  AST 98* 106*  ALT 87* 89*  ALKPHOS 90 90  BILITOT 0.7 0.7   ------------------------------------------------------------------------------------------------------------------ No results for input(s): CHOL, HDL, LDLCALC, TRIG, CHOLHDL, LDLDIRECT in the last 72 hours.  No results found for: HGBA1C ------------------------------------------------------------------------------------------------------------------ No results for input(s): TSH, T4TOTAL, T3FREE, THYROIDAB in the last 72 hours.  Invalid input(s): FREET3 ------------------------------------------------------------------------------------------------------------------ Recent Labs    10/14/19 0006 10/14/19 0650  FERRITIN 461* 514*    Coagulation profile No results for input(s): INR, PROTIME in the last 168 hours.  Recent Labs    10/14/19 0006 10/14/19 0650  DDIMER <0.27 0.27    Cardiac Enzymes No results for input(s): CKMB, TROPONINI, MYOGLOBIN in the last 168 hours.  Invalid input(s): CK ------------------------------------------------------------------------------------------------------------------    Component Value Date/Time   BNP 3.4 03/18/2017 1004    Micro Results No results found for this or any previous visit (from the past 240 hour(s)).  Radiology Reports DG Chest Portable 1 View  Result Date: 10/13/2019 CLINICAL DATA:  Pt arrives via [facility] from home with c/c of cough sob and fatigue for 5 days. Pt had room air sat 89%-93% with ems on arrival to ED sat's 94% but feels sob and has constant cough. Pt is covid+ EXAM: PORTABLE CHEST 1 VIEW COMPARISON:  10/12/2017. FINDINGS: There are linear opacities noted in the right mid lung and in the left lung base, new since the prior exams. Remainder of the lungs is clear. No pleural effusion or pneumothorax. Cardiac silhouette is normal in size. No mediastinal or hilar  masses. Skeletal structures are grossly intact. IMPRESSION: Linear right mid lung and left lower lung opacities that are likely due to atelectasis. Infection is also possible as a component, particularly given the patient's history. Electronically Signed   By: Lajean Manes M.D.   On: 10/13/2019 19:52

## 2019-10-14 NOTE — Plan of Care (Signed)
  Problem: Education: Goal: Knowledge of risk factors and measures for prevention of condition will improve Outcome: Progressing   Problem: Coping: Goal: Psychosocial and spiritual needs will be supported Outcome: Progressing   Problem: Respiratory: Goal: Will maintain a patent airway Outcome: Progressing Goal: Complications related to the disease process, condition or treatment will be avoided or minimized Outcome: Progressing   

## 2019-10-14 NOTE — Progress Notes (Signed)
The patient's nasal cannula is always out of her nose when we go in to check on her. Her sats will drop to 87-88% on RA. We replace the nasal cannula in her nares and her sats will come up to 94% 3L [place].

## 2019-10-15 LAB — COMPREHENSIVE METABOLIC PANEL
ALT: 69 U/L — ABNORMAL HIGH (ref 0–44)
AST: 59 U/L — ABNORMAL HIGH (ref 15–41)
Albumin: 3.5 g/dL (ref 3.5–5.0)
Alkaline Phosphatase: 86 U/L (ref 38–126)
Anion gap: 12 (ref 5–15)
BUN: 21 mg/dL — ABNORMAL HIGH (ref 6–20)
CO2: 27 mmol/L (ref 22–32)
Calcium: 9.7 mg/dL (ref 8.9–10.3)
Chloride: 101 mmol/L (ref 98–111)
Creatinine, Ser: 0.74 mg/dL (ref 0.44–1.00)
GFR calc Af Amer: >60 mL/min (ref >60)
GFR calc non Af Amer: >60 mL/min (ref >60)
Glucose, Bld: 181 mg/dL — ABNORMAL HIGH (ref 70–99)
Potassium: 4.1 mmol/L (ref 3.5–5.1)
Sodium: 140 mmol/L (ref 135–145)
Total Bilirubin: 0.5 mg/dL (ref 0.3–1.2)
Total Protein: 7.1 g/dL (ref 6.5–8.1)

## 2019-10-15 LAB — CBC WITH DIFFERENTIAL/PLATELET
Abs Immature Granulocytes: 0.01 10*3/uL (ref 0.00–0.07)
Basophils Absolute: 0 10*3/uL (ref 0.0–0.1)
Basophils Relative: 0 %
Eosinophils Absolute: 0 10*3/uL (ref 0.0–0.5)
Eosinophils Relative: 0 %
HCT: 43.1 % (ref 36.0–46.0)
Hemoglobin: 14 g/dL (ref 12.0–15.0)
Immature Granulocytes: 0 %
Lymphocytes Relative: 24 %
Lymphs Abs: 0.8 10*3/uL (ref 0.7–4.0)
MCH: 30.2 pg (ref 26.0–34.0)
MCHC: 32.5 g/dL (ref 30.0–36.0)
MCV: 92.9 fL (ref 80.0–100.0)
Monocytes Absolute: 0.3 10*3/uL (ref 0.1–1.0)
Monocytes Relative: 8 %
Neutro Abs: 2.2 10*3/uL (ref 1.7–7.7)
Neutrophils Relative %: 68 %
Platelets: 222 10*3/uL (ref 150–400)
RBC: 4.64 MIL/uL (ref 3.87–5.11)
RDW: 13 % (ref 11.5–15.5)
WBC: 3.3 10*3/uL — ABNORMAL LOW (ref 4.0–10.5)
nRBC: 0 % (ref 0.0–0.2)

## 2019-10-15 LAB — C-REACTIVE PROTEIN: CRP: 4.5 mg/dL — ABNORMAL HIGH (ref <1.0)

## 2019-10-15 LAB — FERRITIN: Ferritin: 334 ng/mL — ABNORMAL HIGH (ref 11–307)

## 2019-10-15 LAB — GLUCOSE, CAPILLARY
Glucose-Capillary: 128 mg/dL — ABNORMAL HIGH (ref 70–99)
Glucose-Capillary: 112 mg/dL — ABNORMAL HIGH (ref 70–99)
Glucose-Capillary: 177 mg/dL — ABNORMAL HIGH (ref 70–99)
Glucose-Capillary: 151 mg/dL — ABNORMAL HIGH (ref 70–99)

## 2019-10-15 LAB — D-DIMER, QUANTITATIVE: D-Dimer, Quant: 0.28 ug/mL-FEU (ref 0.00–0.50)

## 2019-10-15 MED ORDER — ALUM & MAG HYDROXIDE-SIMETH 200-200-20 MG/5ML PO SUSP
30.0000 mL | ORAL | Status: DC | PRN
  Administered 2019-10-15: 30 mL via ORAL
  Filled 2019-10-15: qty 30

## 2019-10-15 MED ORDER — PANTOPRAZOLE SODIUM 40 MG PO TBEC
40.0000 mg | DELAYED_RELEASE_TABLET | Freq: Every day | ORAL | Status: DC
  Administered 2019-10-15 – 2019-10-17 (×3): 40 mg via ORAL
  Filled 2019-10-15 (×3): qty 1

## 2019-10-15 NOTE — Progress Notes (Signed)
RT NOTE:  CPAP setup and placed on patient. 2L O2 bled in and humidity chamber filled. Mask adjusted to patient. Patient understands RT is available if she needs assistance.

## 2019-10-15 NOTE — Plan of Care (Signed)
  Problem: Education: Goal: Knowledge of risk factors and measures for prevention of condition will improve Outcome: Progressing   Problem: Coping: Goal: Psychosocial and spiritual needs will be supported Outcome: Progressing   Problem: Respiratory: Goal: Will maintain a patent airway Outcome: Progressing Goal: Complications related to the disease process, condition or treatment will be avoided or minimized Outcome: Progressing   

## 2019-10-15 NOTE — Progress Notes (Addendum)
PROGRESS NOTE                                                                                                                                                                                                             Patient Demographics:    Kristina Huffman, is a 59 y.o. female, DOB - [birth year], [id number]  Outpatient Primary MD for the patient is Audley Hose, MD   Admit date - 10/13/2019   LOS - 1  Chief Complaint  Patient presents with  . Cough  . Fatigue       Brief Narrative: Patient is a 59 y.o. female with PMHx of DM-2, HTN, bipolar disorder, EtOH abuse-who presented with shortness of breath-was found to have acute hypoxic respiratory failure secondary to COVID-19 pneumonia.  See below for further details.   Subjective:    Kristina Huffman today feels better than yesterday-was titrated to room air.  Per nursing staff-with movement desaturates to the high 80s.   Assessment  & Plan :   Acute Hypoxic Resp Failure due to Covid 19 Viral pneumonia: Improved-on room air at rest-suspect will require O2 with movement.  Continue steroids/remdesivir.    Unfortunately-patient does not drive-per social work-unable to arrange transportation to the infusion center-so patient will likely remain inpatient to complete remdesivir.  Fever: afebrile  O2 requirements:  SpO2: 92 % O2 Flow Rate (L/min): 2 L/min   COVID-19 Labs: Recent Labs    10/14/19 0006 10/14/19 0650 10/15/19 0036  DDIMER <0.27 0.27 0.28  FERRITIN 461* 514* 334*  CRP 4.3* 5.0* 4.5*       Component Value Date/Time   BNP 3.4 03/18/2017 1004    Recent Labs  Lab 10/14/19 0006  PROCALCITON <0.10    No results found for: SARSCOV2NAA   COVID-19 Medications: Steroids: 1/21>> Remdesivir: 1/21>>  Other medications: Diuretics:Euvolemic-no need for lasix Antibiotics:Not needed as no evidence of bacterial infection  Prone/Incentive  Spirometry: encouraged  incentive spirometry use 3-4/hour.  DVT Prophylaxis  :  Lovenox   AKI: Mild-likely hemodynamically mediated-resolved.  Hypokalemia: Likely secondary to HCTZ-resolved.  Transaminitis: Likely secondary to COVID-19-?  History of hepatitis C.  HTN: BP controlled-HCTZ on hold.  Resume when able.  DM-2: Monitor CBGs on SSI.  Metformin on hold.  Check A1c  CBG (last 3)  Recent Labs    10/14/19 1637 10/14/19  2143 10/15/19 0730  GLUCAP 153* 178* 128*     Peripheral neuropathy: Suspect related to DM-on Neurontin.  Follow.  Bipolar disorder: Appears stable-continue Seroquel, fluoxetine and trazodone  History of alcohol abuse: Claims she has cut back and only drinks 2 to 3 glasses of wine in a week-monitor for signs of withdrawal.  OSA: CPAP nightly.  Obesity: Estimated body mass index is 35.76 kg/m as calculated from the following:   Height as of this encounter: 5\' 6"  (1.676 m).   Weight as of this encounter: 100.5 kg.   Consults  :  None  Procedures  :  None  ABG:    Component Value Date/Time   TCO2 25 06/30/2013 0031    Vent Settings: N/A  Condition - Stable  Family Communication  : Left a voicemail for spouse on 1/23 Code Status :  Full Code  Diet :  Diet Order            Diet heart healthy/carb modified Room service appropriate? Yes; Fluid consistency: Thin  Diet effective now               Disposition Plan  :  Remain hospitalized-Home when she completes her dose of remdesivir  Barriers to discharge: Hypoxia requiring O2 supplementation/complete 5 days of IV Remdesivir  Antimicorbials  :    Anti-infectives (From admission, onward)   Start     Dose/Rate Route Frequency Ordered Stop   10/15/19 1000  remdesivir 100 mg in sodium chloride 0.9 % 100 mL IVPB     100 mg 200 mL/hr over 30 Minutes Intravenous Daily 10/14/19 0007 10/19/19 0959   10/14/19 0100  remdesivir 200 mg in sodium chloride 0.9% 250 mL IVPB     200 mg 580 mL/hr  over 30 Minutes Intravenous Once 10/14/19 0007 10/14/19 0300      Inpatient Medications  Scheduled Meds: . atorvastatin  10 mg Oral QHS  . dexamethasone (DECADRON) injection  6 mg Intravenous Q24H  . enoxaparin (LOVENOX) injection  40 mg Subcutaneous Q24H  . FLUoxetine  80 mg Oral Daily  . gabapentin  400 mg Oral QID  . insulin aspart  0-9 Units Subcutaneous TID WC  . multivitamin with minerals  1 tablet Oral Daily  . Ensure Max Protein  11 oz Oral BID  . QUEtiapine  400 mg Oral QHS   Continuous Infusions: . remdesivir 100 mg in NS 100 mL 100 mg (10/15/19 1030)   PRN Meds:.acetaminophen **OR** acetaminophen, albuterol, chlorpheniramine-HYDROcodone, guaiFENesin-dextromethorphan, hydrALAZINE, ondansetron **OR** ondansetron (ZOFRAN) IV   Time Spent in minutes  25  See all Orders from today for further details   Oren Binet M.D on 10/15/2019 at 12:04 PM  To page go to [email or web address] - use universal password  [facility] -  Office  [phone]    Objective:   Vitals:   10/14/19 2341 10/15/19 0405 10/15/19 0808 10/15/19 1115  BP: 101/74 115/79 (!) 99/58 123/64  Pulse: (!) 59 77 (!) 59 70  Resp: (!) 27 (!) 25 18 18   Temp: 98.4 F (36.9 C) 97.7 F (36.5 C) 98.1 F (36.7 C) 98.2 F (36.8 C)  TempSrc: Oral Oral Oral Oral  SpO2: 90% 90% 93% 92%  Weight:      Height:        Wt Readings from Last 3 Encounters:  10/14/19 100.5 kg  12/02/17 117.9 kg  10/12/17 113.4 kg     Intake/Output Summary (Last 24 hours) at 10/15/2019 1204 Last data filed at 10/15/2019 0400 Gross  per 24 hour  Intake 450 ml  Output --  Net 450 ml     Physical Exam Gen Exam:Alert awake-not in any distress HEENT:atraumatic, normocephalic Chest: B/L clear to auscultation anteriorly CVS:S1S2 regular Abdomen:soft non tender, non distended Extremities:no edema Neurology: Non focal Skin: no rash   Data Review:    CBC Recent Labs  Lab 10/13/19 2216 10/14/19 0650 10/15/19 0036   WBC 2.5* 2.7* 3.3*  HGB 14.4 13.8 14.0  HCT 42.5 42.2 43.1  PLT 170 176 222  MCV 92.6 93.4 92.9  MCH 31.4 30.5 30.2  MCHC 33.9 32.7 32.5  RDW 13.1 13.2 13.0  LYMPHSABS 1.0 0.4* 0.8  MONOABS 0.2 0.1 0.3  EOSABS 0.0 0.0 0.0  BASOSABS 0.0 0.0 0.0    Chemistries  Recent Labs  Lab 10/13/19 2216 10/14/19 0650 10/15/19 0036  NA 137 138 140  K 3.2* 3.7 4.1  CL 98 101 101  CO2 27 25 27   GLUCOSE 135* 219* 181*  BUN 14 19 21*  CREATININE 1.08* 0.80 0.74  CALCIUM 8.7* 8.6* 9.7  AST 98* 106* 59*  ALT 87* 89* 69*  ALKPHOS 90 90 86  BILITOT 0.7 0.7 0.5   ------------------------------------------------------------------------------------------------------------------ No results for input(s): CHOL, HDL, LDLCALC, TRIG, CHOLHDL, LDLDIRECT in the last 72 hours.  No results found for: HGBA1C ------------------------------------------------------------------------------------------------------------------ No results for input(s): TSH, T4TOTAL, T3FREE, THYROIDAB in the last 72 hours.  Invalid input(s): FREET3 ------------------------------------------------------------------------------------------------------------------ Recent Labs    10/14/19 0650 10/15/19 0036  FERRITIN 514* 334*    Coagulation profile No results for input(s): INR, PROTIME in the last 168 hours.  Recent Labs    10/14/19 0650 10/15/19 0036  DDIMER 0.27 0.28    Cardiac Enzymes No results for input(s): CKMB, TROPONINI, MYOGLOBIN in the last 168 hours.  Invalid input(s): CK ------------------------------------------------------------------------------------------------------------------    Component Value Date/Time   BNP 3.4 03/18/2017 1004    Micro Results No results found for this or any previous visit (from the past 240 hour(s)).  Radiology Reports DG Chest Portable 1 View  Result Date: 10/13/2019 CLINICAL DATA:  Pt arrives via [facility] from home with c/c of cough sob and fatigue for 5 days. Pt  had room air sat 89%-93% with ems on arrival to ED sat's 94% but feels sob and has constant cough. Pt is covid+ EXAM: PORTABLE CHEST 1 VIEW COMPARISON:  10/12/2017. FINDINGS: There are linear opacities noted in the right mid lung and in the left lung base, new since the prior exams. Remainder of the lungs is clear. No pleural effusion or pneumothorax. Cardiac silhouette is normal in size. No mediastinal or hilar masses. Skeletal structures are grossly intact. IMPRESSION: Linear right mid lung and left lower lung opacities that are likely due to atelectasis. Infection is also possible as a component, particularly given the patient's history. Electronically Signed   By: Lajean Manes M.D.   On: 10/13/2019 19:52

## 2019-10-16 LAB — CBC WITH DIFFERENTIAL/PLATELET
Abs Immature Granulocytes: 0.02 10*3/uL (ref 0.00–0.07)
Basophils Absolute: 0 10*3/uL (ref 0.0–0.1)
Basophils Relative: 0 %
Eosinophils Absolute: 0 10*3/uL (ref 0.0–0.5)
Eosinophils Relative: 0 %
HCT: 39.6 % (ref 36.0–46.0)
Hemoglobin: 13 g/dL (ref 12.0–15.0)
Immature Granulocytes: 0 %
Lymphocytes Relative: 30 %
Lymphs Abs: 1.3 10*3/uL (ref 0.7–4.0)
MCH: 30.7 pg (ref 26.0–34.0)
MCHC: 32.8 g/dL (ref 30.0–36.0)
MCV: 93.4 fL (ref 80.0–100.0)
Monocytes Absolute: 0.3 10*3/uL (ref 0.1–1.0)
Monocytes Relative: 7 %
Neutro Abs: 2.8 10*3/uL (ref 1.7–7.7)
Neutrophils Relative %: 63 %
Platelets: 221 10*3/uL (ref 150–400)
RBC: 4.24 MIL/uL (ref 3.87–5.11)
RDW: 13.2 % (ref 11.5–15.5)
WBC: 4.5 10*3/uL (ref 4.0–10.5)
nRBC: 0 % (ref 0.0–0.2)

## 2019-10-16 LAB — HEMOGLOBIN A1C
Hgb A1c MFr Bld: 6.2 % — ABNORMAL HIGH (ref 4.8–5.6)
Mean Plasma Glucose: 131.24 mg/dL

## 2019-10-16 LAB — COMPREHENSIVE METABOLIC PANEL
ALT: 53 U/L — ABNORMAL HIGH (ref 0–44)
AST: 41 U/L (ref 15–41)
Albumin: 3.2 g/dL — ABNORMAL LOW (ref 3.5–5.0)
Alkaline Phosphatase: 70 U/L (ref 38–126)
Anion gap: 10 (ref 5–15)
BUN: 26 mg/dL — ABNORMAL HIGH (ref 6–20)
CO2: 28 mmol/L (ref 22–32)
Calcium: 9 mg/dL (ref 8.9–10.3)
Chloride: 103 mmol/L (ref 98–111)
Creatinine, Ser: 0.7 mg/dL (ref 0.44–1.00)
GFR calc Af Amer: >60 mL/min (ref >60)
GFR calc non Af Amer: >60 mL/min (ref >60)
Glucose, Bld: 104 mg/dL — ABNORMAL HIGH (ref 70–99)
Potassium: 4.1 mmol/L (ref 3.5–5.1)
Sodium: 141 mmol/L (ref 135–145)
Total Bilirubin: 0.7 mg/dL (ref 0.3–1.2)
Total Protein: 6.5 g/dL (ref 6.5–8.1)

## 2019-10-16 LAB — FERRITIN: Ferritin: 270 ng/mL (ref 11–307)

## 2019-10-16 LAB — GLUCOSE, CAPILLARY
Glucose-Capillary: 105 mg/dL — ABNORMAL HIGH (ref 70–99)
Glucose-Capillary: 82 mg/dL (ref 70–99)
Glucose-Capillary: 111 mg/dL — ABNORMAL HIGH (ref 70–99)
Glucose-Capillary: 193 mg/dL — ABNORMAL HIGH (ref 70–99)

## 2019-10-16 LAB — C-REACTIVE PROTEIN: CRP: 1.4 mg/dL — ABNORMAL HIGH (ref <1.0)

## 2019-10-16 LAB — D-DIMER, QUANTITATIVE: D-Dimer, Quant: <0.27 ug/mL-FEU (ref 0.00–0.50)

## 2019-10-16 MED ORDER — DOCUSATE SODIUM 100 MG PO CAPS
100.0000 mg | ORAL_CAPSULE | Freq: Two times a day (BID) | ORAL | Status: DC
  Administered 2019-10-16 – 2019-10-17 (×3): 100 mg via ORAL
  Filled 2019-10-16 (×3): qty 1

## 2019-10-16 MED ORDER — POLYETHYLENE GLYCOL 3350 17 G PO PACK: 17.0000 g | PACK | Freq: Every day | ORAL | Status: DC | PRN

## 2019-10-16 NOTE — Progress Notes (Signed)
RT NOTE:  Pt refused CPAP at this time. Pt stated she does not plan to wear tonight. She request CPAP be left at bedside in case she changes her mind. RT available if needed.

## 2019-10-16 NOTE — Progress Notes (Signed)
Attempted twice to place a new IV in patient and was unsuccessful. Asking another RN to attempt; will reach out to IV team if needed.

## 2019-10-16 NOTE — Progress Notes (Signed)
PROGRESS NOTE                                                                                                                                                                                                             Patient Demographics:    Kristina Huffman, is a 59 y.o. female, DOB - 28-Feb-1961, [id number]  Outpatient Primary MD for the patient is Audley Hose, MD   Admit date - 10/13/2019   LOS - 2  Chief Complaint  Patient presents with  . Cough  . Fatigue       Brief Narrative: Patient is a 59 y.o. female with PMHx of DM-2, HTN, bipolar disorder, EtOH abuse-who presented with shortness of breath-was found to have acute hypoxic respiratory failure secondary to COVID-19 pneumonia.  See below for further details.   Subjective:   Issues overnight-remains on room air.  Used CPAP last night.   Assessment  & Plan :   Acute Hypoxic Resp Failure due to Covid 19 Viral pneumonia: Remains on room air-continue steroids and remdesivir.  Unfortunately-patient does not drive-per social work-unable to arrange transportation to the infusion center-so patient will likely remain inpatient to complete remdesivir.  Fever: afebrile  O2 requirements:  SpO2: (!) 89 % O2 Flow Rate (L/min): 3 L/min   COVID-19 Labs: Recent Labs    10/14/19 0650 10/15/19 0036 10/16/19 0640  DDIMER 0.27 0.28 <0.27  FERRITIN 514* 334* 270  CRP 5.0* 4.5* 1.4*       Component Value Date/Time   BNP 3.4 03/18/2017 1004    Recent Labs  Lab 10/14/19 0006  PROCALCITON <0.10    No results found for: SARSCOV2NAA   COVID-19 Medications: Steroids: 1/21>> Remdesivir: 1/21>>  Other medications: Diuretics:Euvolemic-no need for lasix Antibiotics:Not needed as no evidence of bacterial infection  Prone/Incentive Spirometry: encouraged  incentive spirometry use 3-4/hour.  DVT Prophylaxis  :  Lovenox   AKI: Mild-likely  hemodynamically mediated-resolved.  Hypokalemia: Likely secondary to HCTZ-resolved.  Transaminitis: Likely secondary to COVID-19-?  History of hepatitis C.  HTN: BP controlled-HCTZ on hold.  Resume when able.  DM-2 (A1c 6.2): CBG stable on SSI.  CBG (last 3)  Recent Labs    10/15/19 2019 10/16/19 0717 10/16/19 1145  GLUCAP 151* 105* 82     Peripheral neuropathy: Suspect related to DM-on Neurontin.  Follow.  Bipolar disorder:  Appears stable-continue Seroquel, fluoxetine and trazodone  History of alcohol abuse: Claims she has cut back and only drinks 2 to 3 glasses of wine in a week-monitor for signs of withdrawal.  OSA: CPAP nightly.  Obesity: Estimated body mass index is 35.76 kg/m as calculated from the following:   Height as of this encounter: 5\' 6"  (1.676 m).   Weight as of this encounter: 100.5 kg.   Consults  :  None  Procedures  :  None  ABG:    Component Value Date/Time   TCO2 25 06/30/2013 0031    Vent Settings: N/A  Condition - Stable  Family Communication  : Left a voicemail for spouse on 1/23 Code Status :  Full Code  Diet :  Diet Order            Diet heart healthy/carb modified Room service appropriate? Yes; Fluid consistency: Thin  Diet effective now               Disposition Plan  :  Remain hospitalized-Home when she completes her dose of remdesivir  Barriers to discharge: Hypoxia requiring O2 supplementation/complete 5 days of IV Remdesivir  Antimicorbials  :    Anti-infectives (From admission, onward)   Start     Dose/Rate Route Frequency Ordered Stop   10/15/19 1000  remdesivir 100 mg in sodium chloride 0.9 % 100 mL IVPB     100 mg 200 mL/hr over 30 Minutes Intravenous Daily 10/14/19 0007 10/19/19 0959   10/14/19 0100  remdesivir 200 mg in sodium chloride 0.9% 250 mL IVPB     200 mg 580 mL/hr over 30 Minutes Intravenous Once 10/14/19 0007 10/14/19 0300      Inpatient Medications  Scheduled Meds: . atorvastatin  10 mg  Oral QHS  . dexamethasone (DECADRON) injection  6 mg Intravenous Q24H  . enoxaparin (LOVENOX) injection  40 mg Subcutaneous Q24H  . FLUoxetine  80 mg Oral Daily  . gabapentin  400 mg Oral QID  . insulin aspart  0-9 Units Subcutaneous TID WC  . multivitamin with minerals  1 tablet Oral Daily  . pantoprazole  40 mg Oral Daily  . Ensure Max Protein  11 oz Oral BID  . QUEtiapine  400 mg Oral QHS   Continuous Infusions: . remdesivir 100 mg in NS 100 mL Stopped (10/15/19 1100)   PRN Meds:.acetaminophen **OR** acetaminophen, albuterol, alum & mag hydroxide-simeth, chlorpheniramine-HYDROcodone, guaiFENesin-dextromethorphan, hydrALAZINE, ondansetron **OR** ondansetron (ZOFRAN) IV   Time Spent in minutes  25  See all Orders from today for further details   Oren Binet M.D on 10/16/2019 at 1:38 PM  To page go to [email or web address] - use universal password  [facility] -  Office  [phone]    Objective:   Vitals:   10/16/19 0000 10/16/19 0402 10/16/19 0719 10/16/19 1140  BP:  (!) 104/55 118/72 124/80  Pulse: (!) 50 (!) 52 (!) 57 63  Resp: (!) 21 20 16 20   Temp:  98.1 F (36.7 C) 98 F (36.7 C) 98.7 F (37.1 C)  TempSrc:  Oral Oral Oral  SpO2: 97% 96% 96% (!) 89%  Weight:      Height:        Wt Readings from Last 3 Encounters:  10/14/19 100.5 kg  12/02/17 117.9 kg  10/12/17 113.4 kg     Intake/Output Summary (Last 24 hours) at 10/16/2019 1338 Last data filed at 10/15/2019 2000 Gross per 24 hour  Intake 0 ml  Output --  Net 0 ml  Physical Exam Gen Exam:Alert awake-not in any distress HEENT:atraumatic, normocephalic Chest: B/L clear to auscultation anteriorly CVS:S1S2 regular Abdomen:soft non tender, non distended Extremities:no edema Neurology: Non focal Skin: no rash   Data Review:    CBC Recent Labs  Lab 10/13/19 2216 10/14/19 0650 10/15/19 0036 10/16/19 0640  WBC 2.5* 2.7* 3.3* 4.5  HGB 14.4 13.8 14.0 13.0  HCT 42.5 42.2 43.1 39.6  PLT  170 176 222 221  MCV 92.6 93.4 92.9 93.4  MCH 31.4 30.5 30.2 30.7  MCHC 33.9 32.7 32.5 32.8  RDW 13.1 13.2 13.0 13.2  LYMPHSABS 1.0 0.4* 0.8 1.3  MONOABS 0.2 0.1 0.3 0.3  EOSABS 0.0 0.0 0.0 0.0  BASOSABS 0.0 0.0 0.0 0.0    Chemistries  Recent Labs  Lab 10/13/19 2216 10/14/19 0650 10/15/19 0036 10/16/19 0640  NA 137 138 140 141  K 3.2* 3.7 4.1 4.1  CL 98 101 101 103  CO2 27 25 27 28   GLUCOSE 135* 219* 181* 104*  BUN 14 19 21* 26*  CREATININE 1.08* 0.80 0.74 0.70  CALCIUM 8.7* 8.6* 9.7 9.0  AST 98* 106* 59* 41  ALT 87* 89* 69* 53*  ALKPHOS 90 90 86 70  BILITOT 0.7 0.7 0.5 0.7   ------------------------------------------------------------------------------------------------------------------ No results for input(s): CHOL, HDL, LDLCALC, TRIG, CHOLHDL, LDLDIRECT in the last 72 hours.  Lab Results  Component Value Date   HGBA1C 6.2 (H) 10/16/2019   ------------------------------------------------------------------------------------------------------------------ No results for input(s): TSH, T4TOTAL, T3FREE, THYROIDAB in the last 72 hours.  Invalid input(s): FREET3 ------------------------------------------------------------------------------------------------------------------ Recent Labs    10/15/19 0036 10/16/19 0640  FERRITIN 334* 270    Coagulation profile No results for input(s): INR, PROTIME in the last 168 hours.  Recent Labs    10/15/19 0036 10/16/19 0640  DDIMER 0.28 <0.27    Cardiac Enzymes No results for input(s): CKMB, TROPONINI, MYOGLOBIN in the last 168 hours.  Invalid input(s): CK ------------------------------------------------------------------------------------------------------------------    Component Value Date/Time   BNP 3.4 03/18/2017 1004    Micro Results No results found for this or any previous visit (from the past 240 hour(s)).  Radiology Reports DG Chest Portable 1 View  Result Date: 10/13/2019 CLINICAL DATA:  Pt  arrives via [facility] from home with c/c of cough sob and fatigue for 5 days. Pt had room air sat 89%-93% with ems on arrival to ED sat's 94% but feels sob and has constant cough. Pt is covid+ EXAM: PORTABLE CHEST 1 VIEW COMPARISON:  10/12/2017. FINDINGS: There are linear opacities noted in the right mid lung and in the left lung base, new since the prior exams. Remainder of the lungs is clear. No pleural effusion or pneumothorax. Cardiac silhouette is normal in size. No mediastinal or hilar masses. Skeletal structures are grossly intact. IMPRESSION: Linear right mid lung and left lower lung opacities that are likely due to atelectasis. Infection is also possible as a component, particularly given the patient's history. Electronically Signed   By: Lajean Manes M.D.   On: 10/13/2019 19:52

## 2019-10-16 NOTE — Plan of Care (Signed)
  Problem: Education: Goal: Knowledge of risk factors and measures for prevention of condition will improve Outcome: Progressing   Problem: Coping: Goal: Psychosocial and spiritual needs will be supported Outcome: Progressing   Problem: Respiratory: Goal: Will maintain a patent airway Outcome: Progressing Goal: Complications related to the disease process, condition or treatment will be avoided or minimized Outcome: Progressing   

## 2019-10-17 LAB — CBC WITH DIFFERENTIAL/PLATELET
Abs Immature Granulocytes: 0.02 10*3/uL (ref 0.00–0.07)
Basophils Absolute: 0 10*3/uL (ref 0.0–0.1)
Basophils Relative: 1 %
Eosinophils Absolute: 0 10*3/uL (ref 0.0–0.5)
Eosinophils Relative: 0 %
HCT: 40.6 % (ref 36.0–46.0)
Hemoglobin: 13.3 g/dL (ref 12.0–15.0)
Immature Granulocytes: 1 %
Lymphocytes Relative: 22 %
Lymphs Abs: 0.9 10*3/uL (ref 0.7–4.0)
MCH: 30.6 pg (ref 26.0–34.0)
MCHC: 32.8 g/dL (ref 30.0–36.0)
MCV: 93.3 fL (ref 80.0–100.0)
Monocytes Absolute: 0.3 10*3/uL (ref 0.1–1.0)
Monocytes Relative: 7 %
Neutro Abs: 2.7 10*3/uL (ref 1.7–7.7)
Neutrophils Relative %: 69 %
Platelets: 252 10*3/uL (ref 150–400)
RBC: 4.35 MIL/uL (ref 3.87–5.11)
RDW: 13 % (ref 11.5–15.5)
WBC: 3.9 10*3/uL — ABNORMAL LOW (ref 4.0–10.5)
nRBC: 0 % (ref 0.0–0.2)

## 2019-10-17 LAB — COMPREHENSIVE METABOLIC PANEL
ALT: 57 U/L — ABNORMAL HIGH (ref 0–44)
AST: 47 U/L — ABNORMAL HIGH (ref 15–41)
Albumin: 3.1 g/dL — ABNORMAL LOW (ref 3.5–5.0)
Alkaline Phosphatase: 79 U/L (ref 38–126)
Anion gap: 10 (ref 5–15)
BUN: 25 mg/dL — ABNORMAL HIGH (ref 6–20)
CO2: 25 mmol/L (ref 22–32)
Calcium: 8.7 mg/dL — ABNORMAL LOW (ref 8.9–10.3)
Chloride: 105 mmol/L (ref 98–111)
Creatinine, Ser: 0.73 mg/dL (ref 0.44–1.00)
GFR calc Af Amer: >60 mL/min (ref >60)
GFR calc non Af Amer: >60 mL/min (ref >60)
Glucose, Bld: 130 mg/dL — ABNORMAL HIGH (ref 70–99)
Potassium: 4.2 mmol/L (ref 3.5–5.1)
Sodium: 140 mmol/L (ref 135–145)
Total Bilirubin: 0.5 mg/dL (ref 0.3–1.2)
Total Protein: 6.5 g/dL (ref 6.5–8.1)

## 2019-10-17 LAB — C-REACTIVE PROTEIN: CRP: 1.8 mg/dL — ABNORMAL HIGH (ref <1.0)

## 2019-10-17 LAB — GLUCOSE, CAPILLARY
Glucose-Capillary: 97 mg/dL (ref 70–99)
Glucose-Capillary: 96 mg/dL (ref 70–99)

## 2019-10-17 LAB — FERRITIN: Ferritin: 284 ng/mL (ref 11–307)

## 2019-10-17 LAB — D-DIMER, QUANTITATIVE: D-Dimer, Quant: <0.27 ug/mL-FEU (ref 0.00–0.50)

## 2019-10-17 MED ORDER — ENSURE MAX PROTEIN PO LIQD: 11.0000 [oz_av] | Freq: Two times a day (BID) | ORAL | 0 refills | Status: AC

## 2019-10-17 MED ORDER — DEXAMETHASONE 4 MG PO TABS: 4.0000 mg | ORAL_TABLET | Freq: Every day | ORAL | 0 refills | Status: DC

## 2019-10-17 MED ORDER — DEXAMETHASONE 4 MG PO TABS: 6.0000 mg | ORAL_TABLET | Freq: Every day | ORAL | 0 refills | Status: DC

## 2019-10-17 MED ORDER — BENZONATATE 100 MG PO CAPS: 100.0000 mg | ORAL_CAPSULE | Freq: Three times a day (TID) | ORAL | 0 refills | Status: DC | PRN

## 2019-10-17 NOTE — Discharge Instructions (Addendum)
You are scheduled for an outpatient infusion of Remdesivir at 530PM on Tuesday 1/26.  Please report to [facility] at [address]  Drive to the security guard and tell them you are here for an infusion. They will direct you to the front entrance where we will come and get you.  For questions call [phone].  Thanks       Person Under Monitoring Name: Kristina Huffman  Location: [place] [id number]   Infection Prevention Recommendations for Individuals Confirmed to have, or Being Evaluated for, 2019 Novel Coronavirus (COVID-19) Infection Who Receive Care at Home  Individuals who are confirmed to have, or are being evaluated for, COVID-19 should follow the prevention steps below until a healthcare provider or local or state health department says they can return to normal activities.  Stay home except to get medical care You should restrict activities outside your home, except for getting medical care. Do not go to work, school, or public areas, and do not use public transportation or taxis.  Call ahead before visiting your doctor Before your medical appointment, call the healthcare provider and tell them that you have, or are being evaluated for, COVID-19 infection. This will help the healthcare provider's office take steps to keep other people from getting infected. Ask your healthcare provider to call the local or state health department.  Monitor your symptoms Seek prompt medical attention if your illness is worsening (e.g., difficulty breathing). Before going to your medical appointment, call the healthcare provider and tell them that you have, or are being evaluated for, COVID-19 infection. Ask your healthcare provider to call the local or state health department.  Wear a facemask You should wear a facemask that covers your nose and mouth when you are in the same room with other people and when you visit a healthcare provider. People who live  with or visit you should also wear a facemask while they are in the same room with you.  Separate yourself from other people in your home As much as possible, you should stay in a different room from other people in your home. Also, you should use a separate bathroom, if available.  Avoid sharing household items You should not share dishes, drinking glasses, cups, eating utensils, towels, bedding, or other items with other people in your home. After using these items, you should wash them thoroughly with soap and water.  Cover your coughs and sneezes Cover your mouth and nose with a tissue when you cough or sneeze, or you can cough or sneeze into your sleeve. Throw used tissues in a lined trash can, and immediately wash your hands with soap and water for at least 20 seconds or use an alcohol-based hand rub.  Wash your [facility] your hands often and thoroughly with soap and water for at least 20 seconds. You can use an alcohol-based hand sanitizer if soap and water are not available and if your hands are not visibly dirty. Avoid touching your eyes, nose, and mouth with unwashed hands.   Prevention Steps for Caregivers and Household Members of Individuals Confirmed to have, or Being Evaluated for, COVID-19 Infection Being Cared for in the Home  If you live with, or provide care at home for, a person confirmed to have, or being evaluated for, COVID-19 infection please follow these guidelines to prevent infection:  Follow healthcare provider's instructions Make sure that you understand and can help the patient follow any healthcare provider instructions for all care.  Provide for the patient's basic needs You should help the patient with basic needs in the home and provide support for getting groceries, prescriptions, and other personal needs.  Monitor the patient's symptoms If they are getting sicker, call his or her medical provider and tell them that the patient has, or is being  evaluated for, COVID-19 infection. This will help the healthcare provider's office take steps to keep other people from getting infected. Ask the healthcare provider to call the local or state health department.  Limit the number of people who have contact with the patient  If possible, have only one caregiver for the patient.  Other household members should stay in another home or place of residence. If this is not possible, they should stay  in another room, or be separated from the patient as much as possible. Use a separate bathroom, if available.  Restrict visitors who do not have an essential need to be in the home.  Keep older adults, very young children, and other sick people away from the patient Keep older adults, very young children, and those who have compromised immune systems or chronic health conditions away from the patient. This includes people with chronic heart, lung, or kidney conditions, diabetes, and cancer.  Ensure good ventilation Make sure that shared spaces in the home have good air flow, such as from an air conditioner or an opened window, weather permitting.  Wash your hands often  Wash your hands often and thoroughly with soap and water for at least 20 seconds. You can use an alcohol based hand sanitizer if soap and water are not available and if your hands are not visibly dirty.  Avoid touching your eyes, nose, and mouth with unwashed hands.  Use disposable paper towels to dry your hands. If not available, use dedicated cloth towels and replace them when they become wet.  Wear a facemask and gloves  Wear a disposable facemask at all times in the room and gloves when you touch or have contact with the patient's blood, body fluids, and/or secretions or excretions, such as sweat, saliva, sputum, nasal mucus, vomit, urine, or feces.  Ensure the mask fits over your nose and mouth tightly, and do not touch it during use.  Throw out disposable facemasks and  gloves after using them. Do not reuse.  Wash your hands immediately after removing your facemask and gloves.  If your personal clothing becomes contaminated, carefully remove clothing and launder. Wash your hands after handling contaminated clothing.  Place all used disposable facemasks, gloves, and other waste in a lined container before disposing them with other household waste.  Remove gloves and wash your hands immediately after handling these items.  Do not share dishes, glasses, or other household items with the patient  Avoid sharing household items. You should not share dishes, drinking glasses, cups, eating utensils, towels, bedding, or other items with a patient who is confirmed to have, or being evaluated for, COVID-19 infection.  After the person uses these items, you should wash them thoroughly with soap and water.  Wash laundry thoroughly  Immediately remove and wash clothes or bedding that have blood, body fluids, and/or secretions or excretions, such as sweat, saliva, sputum, nasal mucus, vomit, urine, or feces, on them.  Wear gloves when handling laundry from the patient.  Read and follow directions on labels of laundry or clothing items and detergent. In general, wash and dry with the warmest temperatures recommended on the label.  Clean all areas the individual  has used often  Clean all touchable surfaces, such as counters, tabletops, doorknobs, bathroom fixtures, toilets, phones, keyboards, tablets, and bedside tables, every day. Also, clean any surfaces that may have blood, body fluids, and/or secretions or excretions on them.  Wear gloves when cleaning surfaces the patient has come in contact with.  Use a diluted bleach solution (e.g., dilute bleach with 1 part bleach and 10 parts water) or a household disinfectant with a label that says EPA-registered for coronaviruses. To make a bleach solution at home, add 1 tablespoon of bleach to 1 quart (4 cups) of water. For  a larger supply, add  cup of bleach to 1 gallon (16 cups) of water.  Read labels of cleaning products and follow recommendations provided on product labels. Labels contain instructions for safe and effective use of the cleaning product including precautions you should take when applying the product, such as wearing gloves or eye protection and making sure you have good ventilation during use of the product.  Remove gloves and wash hands immediately after cleaning.  Monitor yourself for signs and symptoms of illness Caregivers and household members are considered close contacts, should monitor their health, and will be asked to limit movement outside of the home to the extent possible. Follow the monitoring steps for close contacts listed on the symptom monitoring form.   ? If you have additional questions, contact your local health department or call the epidemiologist on call at [phone] (available 24/7). ? This guidance is subject to change. For the most up-to-date guidance from [facility], please refer to their website: [email or web address]

## 2019-10-17 NOTE — Discharge Summary (Addendum)
PATIENT DETAILS Name: Kristina Huffman Age: 59 y.o. Sex: female Date of Birth: 1960/10/03 MRN: [id number]. Admitting Physician: Rise Patience, MD [id number], Larey Dresser, MD  Admit Date: 10/13/2019 Discharge date: 10/17/2019  Recommendations for Outpatient Follow-up:  1. Follow up with PCP in 1-2 weeks 2. Please obtain CMP/CBC in one week 3. Repeat Chest Xray in 4-6 week  Admitted From:  Home  Disposition: [place]: No  Equipment/Devices: None  Discharge Condition: Stable  CODE STATUS: FULL CODE  Diet recommendation:  Diet Order            Diet - low sodium heart healthy        Diet heart healthy/carb modified Room service appropriate? Yes; Fluid consistency: Thin  Diet effective now               Brief Summary: Patient is a 59 y.o. female with PMHx of DM-2, HTN, bipolar disorder, EtOH abuse-who presented with shortness of breath-was found to have acute hypoxic respiratory failure secondary to COVID-19 pneumonia.  See below for further details.  Brief Hospital Course: Acute Hypoxic Resp Failure due to Covid 19 Viral pneumonia: Improved-now on room air-treated with steroids and remdesivir.  She will follow up at the infusion center on 1/26 for her last dose of remdesivir, she will be continued on tapering steroids for a few more days.  PCP to repeat chest x-ray in 4 to 6 weeks.  Stable for outpatient follow-up with PCP.   COVID-19 Labs:  Recent Labs    10/15/19 0036 10/16/19 0640 10/17/19 0311  DDIMER 0.28 <0.27 <0.27  FERRITIN 334* 270 284  CRP 4.5* 1.4* 1.8*    No results found for: SARSCOV2NAA   COVID-19 Medications: Steroids: 1/21>> Remdesivir: 1/21>>  AKI: Mild-likely hemodynamically mediated-resolved.  Hypokalemia: Likely secondary to HCTZ-resolved.  Transaminitis: Likely secondary to COVID-19-mild-stable for outpatient follow-up.  HTN: BP controlled-continue close monitoring in the outpatient setting-resume HCTZ on  discharge.  DM-2 (A1c 6.2): CBG stable on SSI.  Stable for outpatient follow-up with PCP.  Bipolar disorder: Appears stable-continue fluoxetine, Seroquel  History of alcohol abuse: Claims she has cut back and only drinks 2 to 3 glasses of wine in a week-no signs of withdrawal during this hospital stay.  OSA: CPAP nightly.  Nutrition Problem: Nutrition Problem: Increased nutrient needs Etiology: catabolic illness(COVID) Signs/Symptoms: estimated needs Interventions: Ensure Enlive (each supplement provides 350kcal and 20 grams of protein)  Obesity: Estimated body mass index is 35.76 kg/m as calculated from the following:   Height as of this encounter: 5\' 6"  (1.676 m).   Weight as of this encounter: 100.5 kg.   Procedures/Studies: None  Discharge Diagnoses:  Principal Problem:   Acute respiratory failure due to COVID-19 [facility]) Active Problems:   Bipolar 1 disorder, mixed, moderate (HCC)   Chronic hepatitis C without hepatic coma ([place])   ARF (acute renal failure) ([place])   Essential hypertension   Controlled type 2 diabetes mellitus with hyperglycemia [facility])   Discharge Instructions:    Person Under Monitoring Name: Kristina Huffman  Location: [place] [address]   Infection Prevention Recommendations for Individuals Confirmed to have, or Being Evaluated for, 2019 Novel Coronavirus (COVID-19) Infection Who Receive Care at Home  Individuals who are confirmed to have, or are being evaluated for, COVID-19 should follow the prevention steps below until a healthcare provider or local or state health department says they can return to normal activities.  Stay home except to get medical care You should  restrict activities outside your home, except for getting medical care. Do not go to work, school, or public areas, and do not use public transportation or taxis.  Call ahead before visiting your doctor Before your medical appointment, call the healthcare  provider and tell them that you have, or are being evaluated for, COVID-19 infection. This will help the healthcare provider's office take steps to keep other people from getting infected. Ask your healthcare provider to call the local or state health department.  Monitor your symptoms Seek prompt medical attention if your illness is worsening (e.g., difficulty breathing). Before going to your medical appointment, call the healthcare provider and tell them that you have, or are being evaluated for, COVID-19 infection. Ask your healthcare provider to call the local or state health department.  Wear a facemask You should wear a facemask that covers your nose and mouth when you are in the same room with other people and when you visit a healthcare provider. People who live with or visit you should also wear a facemask while they are in the same room with you.  Separate yourself from other people in your home As much as possible, you should stay in a different room from other people in your home. Also, you should use a separate bathroom, if available.  Avoid sharing household items You should not share dishes, drinking glasses, cups, eating utensils, towels, bedding, or other items with other people in your home. After using these items, you should wash them thoroughly with soap and water.  Cover your coughs and sneezes Cover your mouth and nose with a tissue when you cough or sneeze, or you can cough or sneeze into your sleeve. Throw used tissues in a lined trash can, and immediately wash your hands with soap and water for at least 20 seconds or use an alcohol-based hand rub.  Wash your [facility] your hands often and thoroughly with soap and water for at least 20 seconds. You can use an alcohol-based hand sanitizer if soap and water are not available and if your hands are not visibly dirty. Avoid touching your eyes, nose, and mouth with unwashed hands.   Prevention Steps for Caregivers  and Household Members of Individuals Confirmed to have, or Being Evaluated for, COVID-19 Infection Being Cared for in the Home  If you live with, or provide care at home for, a person confirmed to have, or being evaluated for, COVID-19 infection please follow these guidelines to prevent infection:  Follow healthcare provider's instructions Make sure that you understand and can help the patient follow any healthcare provider instructions for all care.  Provide for the patient's basic needs You should help the patient with basic needs in the home and provide support for getting groceries, prescriptions, and other personal needs.  Monitor the patient's symptoms If they are getting sicker, call his or her medical provider and tell them that the patient has, or is being evaluated for, COVID-19 infection. This will help the healthcare provider's office take steps to keep other people from getting infected. Ask the healthcare provider to call the local or state health department.  Limit the number of people who have contact with the patient  If possible, have only one caregiver for the patient.  Other household members should stay in another home or place of residence. If this is not possible, they should stay  in another room, or be separated from the patient as much as possible. Use a separate bathroom, if available.  Restrict  visitors who do not have an essential need to be in the home.  Keep older adults, very young children, and other sick people away from the patient Keep older adults, very young children, and those who have compromised immune systems or chronic health conditions away from the patient. This includes people with chronic heart, lung, or kidney conditions, diabetes, and cancer.  Ensure good ventilation Make sure that shared spaces in the home have good air flow, such as from an air conditioner or an opened window, weather permitting.  Wash your hands often  Wash your  hands often and thoroughly with soap and water for at least 20 seconds. You can use an alcohol based hand sanitizer if soap and water are not available and if your hands are not visibly dirty.  Avoid touching your eyes, nose, and mouth with unwashed hands.  Use disposable paper towels to dry your hands. If not available, use dedicated cloth towels and replace them when they become wet.  Wear a facemask and gloves  Wear a disposable facemask at all times in the room and gloves when you touch or have contact with the patient's blood, body fluids, and/or secretions or excretions, such as sweat, saliva, sputum, nasal mucus, vomit, urine, or feces.  Ensure the mask fits over your nose and mouth tightly, and do not touch it during use.  Throw out disposable facemasks and gloves after using them. Do not reuse.  Wash your hands immediately after removing your facemask and gloves.  If your personal clothing becomes contaminated, carefully remove clothing and launder. Wash your hands after handling contaminated clothing.  Place all used disposable facemasks, gloves, and other waste in a lined container before disposing them with other household waste.  Remove gloves and wash your hands immediately after handling these items.  Do not share dishes, glasses, or other household items with the patient  Avoid sharing household items. You should not share dishes, drinking glasses, cups, eating utensils, towels, bedding, or other items with a patient who is confirmed to have, or being evaluated for, COVID-19 infection.  After the person uses these items, you should wash them thoroughly with soap and water.  Wash laundry thoroughly  Immediately remove and wash clothes or bedding that have blood, body fluids, and/or secretions or excretions, such as sweat, saliva, sputum, nasal mucus, vomit, urine, or feces, on them.  Wear gloves when handling laundry from the patient.  Read and follow directions on  labels of laundry or clothing items and detergent. In general, wash and dry with the warmest temperatures recommended on the label.  Clean all areas the individual has used often  Clean all touchable surfaces, such as counters, tabletops, doorknobs, bathroom fixtures, toilets, phones, keyboards, tablets, and bedside tables, every day. Also, clean any surfaces that may have blood, body fluids, and/or secretions or excretions on them.  Wear gloves when cleaning surfaces the patient has come in contact with.  Use a diluted bleach solution (e.g., dilute bleach with 1 part bleach and 10 parts water) or a household disinfectant with a label that says EPA-registered for coronaviruses. To make a bleach solution at home, add 1 tablespoon of bleach to 1 quart (4 cups) of water. For a larger supply, add  cup of bleach to 1 gallon (16 cups) of water.  Read labels of cleaning products and follow recommendations provided on product labels. Labels contain instructions for safe and effective use of the cleaning product including precautions you should take when applying the product,  such as wearing gloves or eye protection and making sure you have good ventilation during use of the product.  Remove gloves and wash hands immediately after cleaning.  Monitor yourself for signs and symptoms of illness Caregivers and household members are considered close contacts, should monitor their health, and will be asked to limit movement outside of the home to the extent possible. Follow the monitoring steps for close contacts listed on the symptom monitoring form.   ? If you have additional questions, contact your local health department or call the epidemiologist on call at [phone] (available 24/7). ? This guidance is subject to change. For the most up-to-date guidance from CDC, please refer to their website: YouBlogs.pl    Activity:  As  tolerated  Discharge Instructions    Call MD for:  difficulty breathing, headache or visual disturbances   Complete by: As directed    Call MD for:  extreme fatigue   Complete by: As directed    Call MD for:  persistant dizziness or light-headedness   Complete by: As directed    Call MD for:  persistant nausea and vomiting   Complete by: As directed    Diet - low sodium heart healthy   Complete by: As directed    Discharge instructions   Complete by: As directed    1.)You are scheduled for an outpatient infusion of Remdesivir at 530PM on Tuesday 1/26.  Please [facility] to [facility] at [address]  Drive to the security guard and tell them you are here for an infusion. They will direct you to the front entrance where we will come and get you.  For questions call [phone].  2.)  3 weeks of isolation from 10/13/2019   Follow with Primary MD  Audley Hose, MD in 1-2 weeks  Please get a complete blood count and chemistry panel checked by your Primary MD at your next visit, and again as instructed by your Primary MD.  Get Medicines reviewed and adjusted: Please take all your medications with you for your next visit with your Primary MD  Laboratory/radiological data: Please request your Primary MD to go over all hospital tests and procedure/radiological results at the follow up, please ask your Primary MD to get all Hospital records sent to his/her office.  In some cases, they will be blood work, cultures and biopsy results pending at the time of your discharge. Please request that your primary care M.D. follows up on these results.  Also Note the following: If you experience worsening of your admission symptoms, develop shortness of breath, life threatening emergency, suicidal or homicidal thoughts you must seek medical attention immediately by calling 911 or calling your MD immediately  if symptoms less severe.  You must read complete instructions/literature along  with all the possible adverse reactions/side effects for all the Medicines you take and that have been prescribed to you. Take any new Medicines after you have completely understood and accpet all the possible adverse reactions/side effects.   Do not drive when taking Pain medications or sleeping medications (Benzodaizepines)  Do not take more than prescribed Pain, Sleep and Anxiety Medications. It is not advisable to combine anxiety,sleep and pain medications without talking with your primary care practitioner  Special Instructions: If you have smoked or chewed Tobacco  in the last 2 yrs please stop smoking, stop any regular Alcohol  and or any Recreational drug use.  Wear Seat belts while driving.  Please note: You were cared for  by a hospitalist during your hospital stay. Once you are discharged, your primary care physician will handle any further medical issues. Please note that NO REFILLS for any discharge medications will be authorized once you are discharged, as it is imperative that you return to your primary care physician (or establish a relationship with a primary care physician if you do not have one) for your post hospital discharge needs so that they can reassess your need for medications and monitor your lab values.   Increase activity slowly   Complete by: As directed      Allergies as of 10/17/2019      Reactions   Sulfa Antibiotics Shortness Of Breath, Swelling   Tight in throat   Aspirin Other (See Comments)   "Ringing in ears"      Medication List    TAKE these medications   albuterol 108 (90 Base) MCG/ACT inhaler Commonly known as: VENTOLIN HFA Inhale 2 puffs into the lungs every 6 (six) hours as needed for wheezing.   atorvastatin 10 MG tablet Commonly known as: LIPITOR Take 1 tablet (10 mg total) by mouth at bedtime. For high cholesterol   B-complex with vitamin C tablet Take 1 tablet by mouth daily after supper.   benzonatate 100 MG capsule Commonly known  as: Tessalon Perles Take 1 capsule (100 mg total) by mouth 3 (three) times daily as needed for cough.   dexamethasone 4 MG tablet Commonly known as: DECADRON Take 1 tablet (4 mg total) by mouth daily. Start taking on: October 18, 2019   docusate sodium 100 MG capsule Commonly known as: COLACE Take 1 capsule (100 mg total) by mouth daily as needed for mild constipation. (May buy from over the counter): For constipation   Ensure Max Protein Liqd Take 330 mLs (11 oz total) by mouth 2 (two) times daily.   FLUoxetine 40 MG capsule Commonly known as: PROZAC Take 1 capsule (40 mg total) by mouth daily. For depression   gabapentin 400 MG capsule Commonly known as: NEURONTIN Take 1 capsule (400 mg total) by mouth 4 (four) times daily. For agitation   hydrochlorothiazide 25 MG tablet Commonly known as: HYDRODIURIL Take 1 tablet (25 mg total) by mouth daily. For high blood pressure   metFORMIN 500 MG tablet Commonly known as: GLUCOPHAGE Take 1 tablet (500 mg total) by mouth 2 (two) times daily with a meal. For diabetes management   Mitigare 0.6 MG Caps Generic drug: Colchicine Take 0.6 mg by mouth daily as needed (gout).   ondansetron 4 MG tablet Commonly known as: ZOFRAN Take 4 mg by mouth every 8 (eight) hours as needed for nausea or vomiting.   pantoprazole 40 MG tablet Commonly known as: PROTONIX Take 40 mg by mouth daily as needed (heartburn).   QUEtiapine 400 MG tablet Commonly known as: SEROQUEL Take 200 mg by mouth at bedtime.   VITAMIN D3 PO Take 1 tablet by mouth daily after supper.      Follow-up Information    Bakare, Mobolaji B, MD. Schedule an appointment as soon as possible for a visit in 1 week(s).   Specialty: Internal Medicine Contact information: [place] [address] [phone]          Allergies  Allergen Reactions  . Sulfa Antibiotics Shortness Of Breath and Swelling    Tight in throat  . Aspirin Other (See  Comments)    "Ringing in ears"    Consultations:   None  Other Procedures/Studies: DG  Chest Portable 1 View  Result Date: 10/13/2019 CLINICAL DATA:  Pt arrives via [facility] from home with c/c of cough sob and fatigue for 5 days. Pt had room air sat 89%-93% with ems on arrival to ED sat's 94% but feels sob and has constant cough. Pt is covid+ EXAM: PORTABLE CHEST 1 VIEW COMPARISON:  10/12/2017. FINDINGS: There are linear opacities noted in the right mid lung and in the left lung base, new since the prior exams. Remainder of the lungs is clear. No pleural effusion or pneumothorax. Cardiac silhouette is normal in size. No mediastinal or hilar masses. Skeletal structures are grossly intact. IMPRESSION: Linear right mid lung and left lower lung opacities that are likely due to atelectasis. Infection is also possible as a component, particularly given the patient's history. Electronically Signed   By: Lajean Manes M.D.   On: 10/13/2019 19:52     TODAY-DAY OF DISCHARGE:  Subjective:   Kristina Huffman today has no headache,no chest abdominal pain,no new weakness tingling or numbness, feels much better wants to go home today.   Objective:   Blood pressure 138/77, pulse 76, temperature 97.8 F (36.6 C), temperature source Oral, resp. rate 18, height 5\' 6"  (1.676 m), weight 100.5 kg, last menstrual period 08/02/2013, SpO2 94 %.  Intake/Output Summary (Last 24 hours) at 10/17/2019 1154 Last data filed at 10/17/2019 0600 Gross per 24 hour  Intake 200 ml  Output --  Net 200 ml   Filed Weights   10/14/19 0215 10/14/19 0317  Weight: 108.9 kg 100.5 kg    Exam: Awake Alert, Oriented *3, No new F.N deficits, Normal affect .AT,PERRAL Supple Neck,No JVD, No cervical lymphadenopathy appriciated.  Symmetrical Chest wall movement, Good air movement bilaterally, CTAB RRR,No Gallops,Rubs or new Murmurs, No Parasternal Heave +ve B.Sounds, Abd Soft, Non tender, No organomegaly appriciated, No rebound  -guarding or rigidity. No Cyanosis, Clubbing or edema, No new Rash or bruise   PERTINENT RADIOLOGIC STUDIES: DG Chest Portable 1 View  Result Date: 10/13/2019 CLINICAL DATA:  Pt arrives [facility] from home with c/c of cough sob and fatigue for 5 days. Pt had room air sat 89%-93% with ems on arrival to ED sat's 94% but feels sob and has constant cough. Pt is covid+ EXAM: PORTABLE CHEST 1 VIEW COMPARISON:  10/12/2017. FINDINGS: There are linear opacities noted in the right mid lung and in the left lung base, new since the prior exams. Remainder of the lungs is clear. No pleural effusion or pneumothorax. Cardiac silhouette is normal in size. No mediastinal or hilar masses. Skeletal structures are grossly intact. IMPRESSION: Linear right mid lung and left lower lung opacities that are likely due to atelectasis. Infection is also possible as a component, particularly given the patient's history. Electronically Signed   By: Lajean Manes M.D.   On: 10/13/2019 19:52     PERTINENT LAB RESULTS: CBC: Recent Labs    10/16/19 0640 10/17/19 0311  WBC 4.5 3.9*  HGB 13.0 13.3  HCT 39.6 40.6  PLT 221 252   CMET CMP     Component Value Date/Time   NA 140 10/17/2019 0311   NA 138 03/18/2017 1004   K 4.2 10/17/2019 0311   CL 105 10/17/2019 0311   CO2 25 10/17/2019 0311   GLUCOSE 130 (H) 10/17/2019 0311   BUN 25 (H) 10/17/2019 0311   BUN 18 03/18/2017 1004   CREATININE 0.73 10/17/2019 0311   CALCIUM 8.7 (L) 10/17/2019 0311   PROT 6.5 10/17/2019 [id number]  PROT 7.2 03/18/2017 1004   ALBUMIN 3.1 (L) 10/17/2019 0311   ALBUMIN 4.5 03/18/2017 1004   AST 47 (H) 10/17/2019 0311   ALT 57 (H) 10/17/2019 0311   ALT 35 (H) 08/28/2016 1127   ALKPHOS 79 10/17/2019 0311   BILITOT 0.5 10/17/2019 0311   BILITOT 0.7 03/18/2017 1004   GFRNONAA >60 10/17/2019 0311   GFRAA >60 10/17/2019 0311    GFR Estimated Creatinine Clearance: 94 mL/min (by C-G formula based on SCr of 0.73 mg/dL). No results for  input(s): LIPASE, AMYLASE in the last 72 hours. No results for input(s): CKTOTAL, CKMB, CKMBINDEX, TROPONINI in the last 72 hours. Invalid input(s): POCBNP Recent Labs    10/16/19 0640 10/17/19 0311  DDIMER <0.27 <0.27   Recent Labs    10/16/19 0640  HGBA1C 6.2*   No results for input(s): CHOL, HDL, LDLCALC, TRIG, CHOLHDL, LDLDIRECT in the last 72 hours. No results for input(s): TSH, T4TOTAL, T3FREE, THYROIDAB in the last 72 hours.  Invalid input(s): FREET3 Recent Labs    10/16/19 0640 10/17/19 0311  FERRITIN 270 284   Coags: No results for input(s): INR in the last 72 hours.  Invalid input(s): PT Microbiology: No results found for this or any previous visit (from the past 240 hour(s)).  FURTHER DISCHARGE INSTRUCTIONS:  Get Medicines reviewed and adjusted: Please take all your medications with you for your next visit with your Primary MD  Laboratory/radiological data: Please request your Primary MD to go over all hospital tests and procedure/radiological results at the follow up, please ask your Primary MD to get all Hospital records sent to his/her office.  In some cases, they will be blood work, cultures and biopsy results pending at the time of your discharge. Please request that your primary care M.D. goes through all the records of your hospital data and follows up on these results.  Also Note the following: If you experience worsening of your admission symptoms, develop shortness of breath, life threatening emergency, suicidal or homicidal thoughts you must seek medical attention immediately by calling 911 or calling your MD immediately  if symptoms less severe.  You must read complete instructions/literature along with all the possible adverse reactions/side effects for all the Medicines you take and that have been prescribed to you. Take any new Medicines after you have completely understood and accpet all the possible adverse reactions/side effects.   Do not  drive when taking Pain medications or sleeping medications (Benzodaizepines)  Do not take more than prescribed Pain, Sleep and Anxiety Medications. It is not advisable to combine anxiety,sleep and pain medications without talking with your primary care practitioner  Special Instructions: If you have smoked or chewed Tobacco  in the last 2 yrs please stop smoking, stop any regular Alcohol  and or any Recreational drug use.  Wear Seat belts while driving.  Please note: You were cared for by a hospitalist during your hospital stay. Once you are discharged, your primary care physician will handle any further medical issues. Please note that NO REFILLS for any discharge medications will be authorized once you are discharged, as it is imperative that you return to your primary care physician (or establish a relationship with a primary care physician if you do not have one) for your post hospital discharge needs so that they can reassess your need for medications and monitor your lab values.  Total Time spent coordinating discharge including counseling, education and face to face time equals 35 minutes.  SignedOren Binet 10/17/2019 11:54  AM

## 2019-10-17 NOTE — TOC Transition Note (Signed)
Transition of Care [facility]) - CM/SW Discharge Note   Patient Details  Name: Madai Hernandezgarci MRN: [id number] Date of Birth: [birth year]  Transition of Care (TOC) CM/SW Contact:  Joaquin Courts, RN Phone Number: 10/17/2019, 1:12 PM   Clinical Narrative:    PTAR transportation arranged.    Final next level of care: Home/Self Care Barriers to Discharge: No Barriers Identified   Patient Goals and CMS Choice Patient states their goals for this hospitalization and ongoing recovery are:: to go home      Discharge Placement                       Discharge Plan and Services   Discharge Planning Services: CM Consult            DME Arranged: N/A DME Agency: NA       HH Arranged: NA HH Agency: NA        Social Determinants of Health (SDOH) Interventions     Readmission Risk Interventions Readmission Risk Prevention Plan 10/17/2019  Transportation Screening Complete  PCP or Specialist Appt within 3-5 Days Complete  HRI or [place] Not Complete  HRI or Home Care Consult comments no needs  Social Work Consult for [place] Planning/Counseling Not Complete  SW consult not completed comments no needs  Palliative Care Screening Not Applicable  Medication Review Press photographer) Complete  Some recent data might be hidden

## 2019-10-17 NOTE — Progress Notes (Signed)
Patient scheduled for outpatient Remdesivir infusion at 530PM on Tuesday 1/26  Please advise them to report to [facility] at [address].  Drive to the security guard and tell them you are here for an infusion. They will direct you to the front entrance where we will come and get you.  For questions call [phone].  Thanks

## 2019-10-17 NOTE — Progress Notes (Signed)
Patient discharged to home with self care. VSS, RA, no complaints of pain or discomfort. PIVs discontinued without complications. Received discharged instructions and verbalized understanding, had no further questions. Left the unit on wheelchair accompanied by staff. Transported home via pre-arranged hospital ride. Driver instructed to go by patient's pharmacy to retrieve new medications, pt aware. Patient understood she has an appointment for tomorrow at 5:30 pm in the infusion clinic for her last dose of Remdesivir. Transportation has been set-up between [place] and patient's house. Patient is aware. vehicle. All personal belongings taken with patient.

## 2019-10-18 ENCOUNTER — Ambulatory Visit (HOSPITAL_COMMUNITY)
Admission: RE | Admit: 2019-10-18 | Discharge: 2019-10-18 | Disposition: A | Discharge status: Home / Self Care | Payer: Medicaid Other | Source: Ambulatory Visit | Attending: Pulmonary Disease | Admitting: Pulmonary Disease

## 2019-10-18 DIAGNOSIS — J1289 Other viral pneumonia: Secondary | ICD-10-CM | POA: Insufficient documentation

## 2019-10-18 DIAGNOSIS — U071 COVID-19: Secondary | ICD-10-CM | POA: Insufficient documentation

## 2019-10-18 MED ORDER — SODIUM CHLORIDE 0.9 % IV SOLN
100.0000 mg | Freq: Once | INTRAVENOUS | Status: AC
  Administered 2019-10-18: 18:00:00 100 mg via INTRAVENOUS

## 2019-10-18 MED ORDER — EPINEPHRINE 0.3 MG/0.3ML IJ SOAJ: 0.3000 mg | Freq: Once | INTRAMUSCULAR | Status: DC | PRN

## 2019-10-18 MED ORDER — SODIUM CHLORIDE 0.9 % IV SOLN
INTRAVENOUS | Status: AC
  Filled 2019-10-18: qty 20

## 2019-10-18 MED ORDER — METHYLPREDNISOLONE SODIUM SUCC 125 MG IJ SOLR: 125.0000 mg | Freq: Once | INTRAMUSCULAR | Status: DC | PRN

## 2019-10-18 MED ORDER — ALBUTEROL SULFATE HFA 108 (90 BASE) MCG/ACT IN AERS: 2.0000 | INHALATION_SPRAY | Freq: Once | RESPIRATORY_TRACT | Status: DC | PRN

## 2019-10-18 MED ORDER — FAMOTIDINE IN NACL 20-0.9 MG/50ML-% IV SOLN: 20.0000 mg | Freq: Once | INTRAVENOUS | Status: DC | PRN

## 2019-10-18 MED ORDER — SODIUM CHLORIDE 0.9 % IV SOLN
INTRAVENOUS | Status: DC | PRN
  Administered 2019-10-18: 250 mL via INTRAVENOUS

## 2019-10-18 MED ORDER — DIPHENHYDRAMINE HCL 50 MG/ML IJ SOLN: 50.0000 mg | Freq: Once | INTRAMUSCULAR | Status: DC | PRN

## 2019-10-18 NOTE — Progress Notes (Signed)
  Diagnosis: COVID-19  Physician:Dr Joya Gaskins  Procedure: Covid Infusion Clinic Med: remdesivir infusion.  Complications: No immediate complications noted.  Discharge: Discharged home   [address] 10/18/2019

## 2019-10-24 ENCOUNTER — Telehealth: Payer: Self-pay | Admitting: *Deleted

## 2019-10-24 NOTE — Telephone Encounter (Signed)
Patient calls with questions regarding post hospitalization for Covid 19. She was hospitalized with Covid 19, discharged on 10/18/19. Reviewed s/sx regarding her respiratory status she should seek treatment at the ED with a mask on including SOB or worsening SOB while resting/chest tightness/unable to breathe. She has an albuterol inhaler and tessalon perles, instructed her to use those as prescribed. Encouraged to increase her fluid intake of water and sports drinks to ensure hydration. Increase daily activity slowly and rest when she needs to. Call back with any questions/concerns or call her pcp. Stated she has had projectile vomiting today. No fever. Encouraged to drink sips every hour/no food today until she can tolerate drinking fluids for 6-8 hours.

## 2019-11-21 ENCOUNTER — Other Ambulatory Visit: Payer: Self-pay

## 2019-11-30 ENCOUNTER — Ambulatory Visit (HOSPITAL_COMMUNITY)
Admission: EM | Admit: 2019-11-30 | Discharge: 2019-11-30 | Disposition: A | Discharge status: Home / Self Care | Payer: Medicaid Other | Attending: Internal Medicine | Admitting: Internal Medicine

## 2019-11-30 ENCOUNTER — Other Ambulatory Visit: Payer: Self-pay

## 2019-11-30 ENCOUNTER — Encounter (HOSPITAL_COMMUNITY): Payer: Self-pay

## 2019-11-30 ENCOUNTER — Ambulatory Visit (INDEPENDENT_AMBULATORY_CARE_PROVIDER_SITE_OTHER): Payer: Medicaid Other

## 2019-11-30 DIAGNOSIS — M25561 Pain in right knee: Secondary | ICD-10-CM

## 2019-11-30 NOTE — ED Notes (Signed)
Patient has an small abrasion to right knee, visible bruising to lower leg.  Patient reports she can touch knee and lower leg without having pain, but touching just below right knee is very painful to minimal touch,  "something is going on in there"

## 2019-11-30 NOTE — ED Notes (Signed)
Patient questioned delay.  Provider is doing a procedure.  Explained this to patient and delay would not be much longer.  Provided patient with beverage/ice.

## 2019-11-30 NOTE — ED Triage Notes (Signed)
Pt states she was walking along and fall. Pt states her right knee is in pain. Pt states she has been taking ibuprofen.

## 2019-11-30 NOTE — ED Provider Notes (Signed)
[place]    CSN: [id number] Arrival date & time: 11/30/19  1328      History   Chief Complaint Chief Complaint  Patient presents with  . Fall    HPI Kristina Huffman is a 59 y.o. female comes to urgent care with complaints of right knee pain. Patient had a mechanical fall a few days ago. She had painful swelling in the anterior aspect of the right knee. She has been icing the knee over the past 3 days. Pain is currently 6 out of 10. She is taking over-the-counter NSAIDs for the pain with partial improvement. She is able to bear weight. She has some numbness on the anterior aspect of the right knee. No knee swelling. No radiation of pain.Marland Kitchen   HPI  Past Medical History:  Diagnosis Date  . Anxiety   . Asthma   . Bipolar disorder ([place])   . Depression   . Diabetes mellitus without complication ([place])   . ETOH abuse   . Hepatitis C   . Heroin abuse ([place])   . History of MRSA infection    legs and spread to face  . Hypertension     Patient Active Problem List   Diagnosis Date Noted  . Acute respiratory failure due to COVID-19 ([place]) 10/14/2019  . ARF (acute renal failure) ([place]) 10/14/2019  . Essential hypertension 10/14/2019  . Controlled type 2 diabetes mellitus with hyperglycemia ([place]) 10/14/2019  . Alcohol use disorder, severe, dependence ([place]) 06/06/2018  . Chronic hepatitis C without hepatic coma ([place]) 06/04/2016  . Substance induced mood disorder ([place]) 09/20/2013  . Alcohol dependency ([place]) 11/11/2011    Class: Acute  . Benzodiazepine dependence ([place]) 09/21/2011  . Bipolar 1 disorder, mixed, moderate ([place]) 09/21/2011  . 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 PTSD (post-traumatic stress disorder) 09/21/2011  . Homeless 09/21/2011    Past Surgical History:  Procedure Laterality Date  . BACK SURGERY    . RADIAL HEAD ARTHROPLASTY  08/09/2012   Procedure: RADIAL HEAD ARTHROPLASTY;  Surgeon: Schuyler Amor, MD;  Location: [place];  Service: Orthopedics;   Laterality: Left;  Left Radial head Replacement    OB History    Gravida  5   Para      Term      Preterm      AB  1   Living        SAB  1   TAB      Ectopic      Multiple      Live Births  4            Home Medications    Prior to Admission medications   Medication Sig Start Date End Date Taking? Authorizing Provider  albuterol (PROVENTIL HFA;VENTOLIN HFA) 108 (90 Base) MCG/ACT inhaler Inhale 2 puffs into the lungs every 6 (six) hours as needed for wheezing. 06/09/18   Lindell Spar I, NP  atorvastatin (LIPITOR) 10 MG tablet Take 1 tablet (10 mg total) by mouth at bedtime. For high cholesterol 06/09/18   Nwoko, Herbert Pun I, NP  B Complex-C (B-COMPLEX WITH VITAMIN C) tablet Take 1 tablet by mouth daily after supper.    [provider]  benzonatate (TESSALON PERLES) 100 MG capsule Take 1 capsule (100 mg total) by mouth 3 (three) times daily as needed for cough. 10/17/19 10/16/20  GhimireHenreitta Leber, MD  Cholecalciferol (VITAMIN D3 PO) Take 1 tablet by mouth daily after supper.    [provider]  dexamethasone (DECADRON) 4  MG tablet Take 1 tablet (4 mg total) by mouth daily. 10/18/19   Ghimire, Henreitta Leber, MD  docusate sodium (COLACE) 100 MG capsule Take 1 capsule (100 mg total) by mouth daily as needed for mild constipation. (May buy from over the counter): For constipation 06/09/18   Lindell Spar I, NP  FLUoxetine (PROZAC) 40 MG capsule Take 1 capsule (40 mg total) by mouth daily. For depression 06/10/18   Lindell Spar I, NP  gabapentin (NEURONTIN) 400 MG capsule Take 1 capsule (400 mg total) by mouth 4 (four) times daily. For agitation 06/09/18   Lindell Spar I, NP  hydrochlorothiazide (HYDRODIURIL) 25 MG tablet Take 1 tablet (25 mg total) by mouth daily. For high blood pressure 06/10/18   Nwoko, Herbert Pun I, NP  MITIGARE 0.6 MG CAPS Take 0.6 mg by mouth daily as needed (gout).  07/04/19   [provider]  ondansetron (ZOFRAN) 4 MG tablet Take 4 mg by mouth  every 8 (eight) hours as needed for nausea or vomiting.    [provider]  pantoprazole (PROTONIX) 40 MG tablet Take 40 mg by mouth daily as needed (heartburn).     [provider]  QUEtiapine (SEROQUEL) 400 MG tablet Take 200 mg by mouth at bedtime.  07/19/19   [provider]  metFORMIN (GLUCOPHAGE) 500 MG tablet Take 1 tablet (500 mg total) by mouth 2 (two) times daily with a meal. For diabetes management 06/09/18 11/30/19  Encarnacion Slates, NP    Family History Family History  Problem Relation Age of Onset  . Depression Mother   . Osteoporosis Mother   . COPD Mother   . Diabetes Father   . Hypertension Father   . Congestive Heart Failure Father   . Aneurysm Father     Social History Social History   Tobacco Use  . Smoking status: Never Smoker  . Smokeless tobacco: Never Used  Substance Use Topics  . Alcohol use: Yes    Comment: sometimes  . Drug use: No    Comment: no drug use for 3 years     Allergies   Sulfa antibiotics and Aspirin   Review of Systems Review of Systems  Constitutional: Positive for activity change. Negative for chills, fatigue and fever.  HENT: Negative.   Musculoskeletal: Positive for arthralgias. Negative for back pain, gait problem, joint swelling and myalgias.  Skin: Positive for color change. Negative for rash and wound.  Neurological: Negative for dizziness, light-headedness and headaches.     Physical Exam Triage Vital Signs ED Triage Vitals  Enc Vitals Group     BP 11/30/19 1401 117/81     Pulse Rate 11/30/19 1401 89     Resp 11/30/19 1401 18     Temp 11/30/19 1401 98 F (36.7 C)     Temp Source 11/30/19 1401 Oral     SpO2 11/30/19 1401 100 %     Weight 11/30/19 1400 210 lb (95.3 kg)     Height --      Head Circumference --      Peak Flow --      Pain Score 11/30/19 1359 6     Pain Loc --      Pain Edu? --      Excl. in [place]? --    No data found.  Updated Vital Signs BP 117/81 (BP Location: Right  Arm)   Pulse 89   Temp 98 F (36.7 C) (Oral)   Resp 18   Wt 95.3 kg  LMP 08/02/2013 Comment: irregular  SpO2 100%   BMI 33.89 kg/m   Visual Acuity Right Eye Distance:   Left Eye Distance:   Bilateral Distance:    Right Eye Near:   Left Eye Near:    Bilateral Near:     Physical Exam Vitals and nursing note reviewed.  Constitutional:      General: She is in acute distress.     Appearance: Normal appearance. She is not ill-appearing.  Cardiovascular:     Rate and Rhythm: Normal rate and regular rhythm.     Pulses: Normal pulses.     Heart sounds: Normal heart sounds.  Pulmonary:     Effort: Pulmonary effort is normal.     Breath sounds: Normal breath sounds.  Musculoskeletal:        General: Normal range of motion.     Comments: Anterior drawer sign is negative. Tenderness on palpation in the patellar ligament area of the right knee. No knee effusion.  Skin:    General: Skin is warm.     Capillary Refill: Capillary refill takes less than 2 seconds.     Findings: Bruising present. No erythema.  Neurological:     Mental Status: She is alert.      UC Treatments / Results  Labs (all labs ordered are listed, but only abnormal results are displayed) Labs Reviewed - No data to display  EKG   Radiology DG Knee Complete 4 Views Right  Result Date: 11/30/2019 CLINICAL DATA:  Right knee pain.  Fall today. EXAM: RIGHT KNEE - COMPLETE 4+ VIEW COMPARISON:  None. FINDINGS: No evidence of fracture, dislocation, or joint effusion. No evidence of arthropathy or other focal bone abnormality. Soft tissues are unremarkable. IMPRESSION: Negative radiographs of the right knee. Electronically Signed   By: Keith Rake M.D.   On: 11/30/2019 15:03    Procedures Procedures (including critical care time)  Medications Ordered in UC Medications - No data to display  Initial Impression / Assessment and Plan / UC Course  I have reviewed the triage vital signs and the nursing  notes.  Pertinent labs & imaging results that were available during my care of the patient were reviewed by me and considered in my medical decision making (see chart for details).     1. Knee pain without effusion: X-ray of the right knee is negative for acute fracture X-ray was independently reviewed by me Records are reviewed Continue icing the right knee. Range of motion exercises Knee brace as needed Over-the-counter Tylenol or Motrin as needed for pain Return precautions given  Final Clinical Impressions(s) / UC Diagnoses   Final diagnoses:  Acute pain of right knee   Discharge Instructions   None    ED Prescriptions    None     PDMP not reviewed this encounter.   Chase Picket, MD 12/01/19 1311

## 2020-01-03 ENCOUNTER — Encounter: Payer: Self-pay | Admitting: Internal Medicine

## 2020-01-10 ENCOUNTER — Telehealth: Payer: Self-pay

## 2020-01-10 NOTE — Patient Instructions (Signed)
Thank you for choosing Primary Care at [facility] to be your medical home!    Leighanna Arredondo was seen by Melina Schools, DO today.   Cecilia Feider's primary care provider is Phill Myron, DO.   For the best care possible, you should try to see Phill Myron, DO whenever you come to the clinic.   We look forward to seeing you again soon!  If you have any questions about your visit today, please call us at [phone] or feel free to reach your primary care provider via [place].

## 2020-01-10 NOTE — Telephone Encounter (Signed)
Called patient to do their pre-visit COVID screening.  Call went to voicemail. Unable to do prescreening.  

## 2020-01-11 ENCOUNTER — Telehealth (INDEPENDENT_AMBULATORY_CARE_PROVIDER_SITE_OTHER): Payer: Medicaid Other | Admitting: Internal Medicine

## 2020-01-11 ENCOUNTER — Encounter: Payer: Self-pay | Admitting: Internal Medicine

## 2020-01-11 DIAGNOSIS — E785 Hyperlipidemia, unspecified: Secondary | ICD-10-CM | POA: Diagnosis not present

## 2020-01-11 DIAGNOSIS — E1165 Type 2 diabetes mellitus with hyperglycemia: Secondary | ICD-10-CM

## 2020-01-11 DIAGNOSIS — Z7689 Persons encountering health services in other specified circumstances: Secondary | ICD-10-CM

## 2020-01-11 DIAGNOSIS — E119 Type 2 diabetes mellitus without complications: Secondary | ICD-10-CM | POA: Diagnosis not present

## 2020-01-11 DIAGNOSIS — Z1211 Encounter for screening for malignant neoplasm of colon: Secondary | ICD-10-CM

## 2020-01-11 DIAGNOSIS — I10 Essential (primary) hypertension: Secondary | ICD-10-CM

## 2020-01-11 DIAGNOSIS — Z1231 Encounter for screening mammogram for malignant neoplasm of breast: Secondary | ICD-10-CM

## 2020-01-11 DIAGNOSIS — K219 Gastro-esophageal reflux disease without esophagitis: Secondary | ICD-10-CM

## 2020-01-11 MED ORDER — ACCU-CHEK GUIDE VI STRP: ORAL_STRIP | 4 refills | Status: DC

## 2020-01-11 MED ORDER — ACCU-CHEK FASTCLIX LANCET KIT: PACK | 0 refills | Status: DC

## 2020-01-11 MED ORDER — ACCU-CHEK FASTCLIX LANCETS MISC: 4 refills | Status: DC

## 2020-01-11 MED ORDER — PANTOPRAZOLE SODIUM 40 MG PO TBEC: 40.0000 mg | DELAYED_RELEASE_TABLET | Freq: Every day | ORAL | 1 refills | Status: DC | PRN

## 2020-01-11 NOTE — Progress Notes (Signed)
Virtual Visit via Telephone Note  I connected with Kristina Huffman, on 01/11/2020 at 10:38 AM by telephone due to the COVID-19 pandemic and verified that I am speaking with the correct person using two identifiers.   Consent: I discussed the limitations, risks, security and privacy concerns of performing an evaluation and management service by telephone and the availability of in person appointments. I also discussed with the patient that there may be a patient responsible charge related to this service. The patient expressed understanding and agreed to proceed.   Location of Patient: Home   Location of Provider: Clinic    Persons participating in Telemedicine visit: Jaydi Dulak Jeanette Caprice [facility] Dr. Juleen China      History of Present Illness: Patient has a visit to establish care.   Chronic HTN Disease Monitoring:  Home BP Monitoring - Does not monitor.  Chest pain- no  Dyspnea- no Headache - no  Medications: HCTZ 25 mg,  Compliance- yes Lightheadedness- no  Edema- no    Diabetes mellitus, Type 2 Disease Monitoring             Blood Sugar Ranges: Fasting - <120              Polyuria: No              Visual problems: no   Urine Microalbumin need to check   Last A1C: 6.2% (Jan 2021)    Medications: Was previously taking Metformin but she discontinued it due to "not feeling well on it".  Medication Compliance: no  Medication Side Effects             Hypoglycemia: no       Past Medical History:  Diagnosis Date  . Aneurysm of splenic artery (HCC)   . Anxiety   . Asthma   . Bipolar disorder ([place])   . Depression   . Diabetes mellitus without complication ([place])   . Diabetic peripheral neuropathy ([place])   . Elevated LFTs   . ETOH abuse   . Hepatitis C   . Heroin abuse ([place])   . History of MRSA infection    legs and spread to face  . Hypertension   . Insomnia   . Mixed hyperlipidemia   . OSA (obstructive sleep apnea)    Allergies   Allergen Reactions  . Sulfa Antibiotics Shortness Of Breath and Swelling    Tight in throat  . Aspirin Other (See Comments)    "Ringing in ears"    Current Outpatient Medications on File Prior to Visit  Medication Sig Dispense Refill  . atorvastatin (LIPITOR) 10 MG tablet Take 1 tablet (10 mg total) by mouth at bedtime. For high cholesterol 5 tablet 0  . B Complex-C (B-COMPLEX WITH VITAMIN C) tablet Take 1 tablet by mouth daily after supper.    . Cholecalciferol (VITAMIN D3 PO) Take 1 tablet by mouth daily after supper.    Marland Kitchen FLUoxetine (PROZAC) 40 MG capsule Take 1 capsule (40 mg total) by mouth daily. For depression 30 capsule 0  . gabapentin (NEURONTIN) 400 MG capsule Take 1 capsule (400 mg total) by mouth 4 (four) times daily. For agitation 120 capsule 0  . hydrochlorothiazide (HYDRODIURIL) 25 MG tablet Take 1 tablet (25 mg total) by mouth daily. For high blood pressure 10 tablet 0  . MITIGARE 0.6 MG CAPS Take 0.6 mg by mouth daily as needed (gout).     . QUEtiapine (SEROQUEL) 200 MG tablet Take 200 mg by mouth at bedtime.    . [  DISCONTINUED] metFORMIN (GLUCOPHAGE) 500 MG tablet Take 1 tablet (500 mg total) by mouth 2 (two) times daily with a meal. For diabetes management 10 tablet 0   No current facility-administered medications on file prior to visit.    Observations/Objective: NAD. Speaking clearly.  Work of breathing normal.  Alert and oriented. Mood appropriate.   Assessment and Plan: 1. Encounter to establish care  2. Controlled type 2 diabetes mellitus with hyperglycemia, without long-term current use of insulin (HCC) Last A1c at goal and fasting CBGs seem to be well controlled as well. Currently not on any glycemic controlling agents. Will plan to repeat A1c. Will check microalbumin/Cr ratio as patient not on Ace or Arb therapy either.  - Comprehensive metabolic panel; Future - Microalbumin/Creatinine Ratio, Urine; Future - Ambulatory referral to Ophthalmology -  Hemoglobin A1c; Future - glucose blood (ACCU-CHEK GUIDE) test strip; Use to check FSBS BID. Dx code: E11.65  Dispense: 100 each; Refill: 4 - Lancets Misc. (ACCU-CHEK FASTCLIX LANCET) KIT; Use to check FSBS BID. Dx code: E11.65  Dispense: 1 kit; Refill: 0 - Accu-Chek FastClix Lancets MISC; Use to check FSBS BID. Dx code: E11.65  Dispense: 102 each; Refill: 4  3. Essential hypertension Unsure if BP at goal. Continue HCTZ. Check CMET to monitor medication. Will follow BP at future in person visits.  - CBC with Differential; Future - Comprehensive metabolic panel; Future  4. Breast cancer screening by mammogram - MM Digital Screening; Future  5. Colon cancer screening - Ambulatory referral to Gastroenterology  6. Hyperlipidemia, unspecified hyperlipidemia type No prior lipid panel available to review. Patient is currently taking Lipitor 10 mg. Will plan to evaluate LDL level and calculate ASCVD risk score to see if patient would benefit from dose increase.  - Lipid panel; Future  7. Gastroesophageal reflux disease without esophagitis - pantoprazole (PROTONIX) 40 MG tablet; Take 1 tablet (40 mg total) by mouth daily as needed (heartburn).  Dispense: 90 tablet; Refill: 1   Follow Up Instructions: Lab visit 4/27; 3 month f/u for chronic medical conditions    I discussed the assessment and treatment plan with the patient. The patient was provided an opportunity to ask questions and all were answered. The patient agreed with the plan and demonstrated an understanding of the instructions.   The patient was advised to call back or seek an in-person evaluation if the symptoms worsen or if the condition fails to improve as anticipated.     I provided 14 minutes total of non-face-to-face time during this encounter including median intraservice time, reviewing previous notes, investigations, ordering medications, medical decision making, coordinating care and patient verbalized understanding at  the end of the visit.    Phill Myron, D.O. Primary Care at [facility]  01/11/2020, 10:38 AM

## 2020-01-12 ENCOUNTER — Encounter: Payer: Self-pay | Admitting: Gastroenterology

## 2020-01-17 ENCOUNTER — Other Ambulatory Visit: Payer: Medicaid Other

## 2020-01-17 ENCOUNTER — Telehealth: Payer: Medicaid Other

## 2020-01-17 ENCOUNTER — Encounter (HOSPITAL_COMMUNITY): Payer: Self-pay

## 2020-01-17 ENCOUNTER — Other Ambulatory Visit: Payer: Self-pay | Admitting: Internal Medicine

## 2020-01-17 DIAGNOSIS — Z1231 Encounter for screening mammogram for malignant neoplasm of breast: Secondary | ICD-10-CM

## 2020-01-19 ENCOUNTER — Other Ambulatory Visit: Payer: Medicaid Other

## 2020-01-23 ENCOUNTER — Ambulatory Visit: Payer: Medicaid Other

## 2020-01-31 ENCOUNTER — Other Ambulatory Visit: Payer: Medicaid Other

## 2020-02-02 ENCOUNTER — Ambulatory Visit: Payer: Medicaid Other

## 2020-02-02 ENCOUNTER — Other Ambulatory Visit: Payer: Medicaid Other

## 2020-02-07 ENCOUNTER — Ambulatory Visit
Admission: RE | Admit: 2020-02-07 | Discharge: 2020-02-07 | Disposition: A | Discharge status: Home / Self Care | Payer: Medicaid Other | Source: Ambulatory Visit | Attending: Internal Medicine | Admitting: Internal Medicine

## 2020-02-07 ENCOUNTER — Ambulatory Visit: Payer: Medicaid Other

## 2020-02-07 ENCOUNTER — Other Ambulatory Visit: Payer: Self-pay

## 2020-02-07 DIAGNOSIS — Z1231 Encounter for screening mammogram for malignant neoplasm of breast: Secondary | ICD-10-CM

## 2020-02-14 ENCOUNTER — Other Ambulatory Visit (INDEPENDENT_AMBULATORY_CARE_PROVIDER_SITE_OTHER): Payer: Medicaid Other

## 2020-02-14 ENCOUNTER — Other Ambulatory Visit: Payer: Self-pay

## 2020-02-14 DIAGNOSIS — I10 Essential (primary) hypertension: Secondary | ICD-10-CM

## 2020-02-14 DIAGNOSIS — E1165 Type 2 diabetes mellitus with hyperglycemia: Secondary | ICD-10-CM

## 2020-02-14 DIAGNOSIS — E785 Hyperlipidemia, unspecified: Secondary | ICD-10-CM | POA: Diagnosis not present

## 2020-02-15 ENCOUNTER — Ambulatory Visit: Payer: Medicaid Other | Admitting: Gastroenterology

## 2020-02-15 ENCOUNTER — Encounter: Payer: Self-pay | Admitting: Gastroenterology

## 2020-02-15 VITALS — BP 112/76 | HR 88 | Ht 66.0 in | Wt 237.0 lb

## 2020-02-15 DIAGNOSIS — Z01818 Encounter for other preprocedural examination: Secondary | ICD-10-CM | POA: Diagnosis not present

## 2020-02-15 DIAGNOSIS — R194 Change in bowel habit: Secondary | ICD-10-CM

## 2020-02-15 DIAGNOSIS — K219 Gastro-esophageal reflux disease without esophagitis: Secondary | ICD-10-CM

## 2020-02-15 DIAGNOSIS — K5900 Constipation, unspecified: Secondary | ICD-10-CM

## 2020-02-15 LAB — COMPREHENSIVE METABOLIC PANEL
ALT: 57 IU/L — ABNORMAL HIGH (ref 0–32)
AST: 47 IU/L — ABNORMAL HIGH (ref 0–40)
Albumin/Globulin Ratio: 2 (ref 1.2–2.2)
Albumin: 4.4 g/dL (ref 3.8–4.9)
Alkaline Phosphatase: 107 IU/L (ref 48–121)
BUN/Creatinine Ratio: 17 (ref 9–23)
BUN: 15 mg/dL (ref 6–24)
Bilirubin Total: 0.6 mg/dL (ref 0.0–1.2)
CO2: 22 mmol/L (ref 20–29)
Calcium: 9.8 mg/dL (ref 8.7–10.2)
Chloride: 100 mmol/L (ref 96–106)
Creatinine, Ser: 0.9 mg/dL (ref 0.57–1.00)
GFR calc Af Amer: 83 mL/min/{1.73_m2} (ref >59)
GFR calc non Af Amer: 72 mL/min/{1.73_m2} (ref >59)
Globulin, Total: 2.2 g/dL (ref 1.5–4.5)
Glucose: 107 mg/dL — ABNORMAL HIGH (ref 65–99)
Potassium: 3.7 mmol/L (ref 3.5–5.2)
Sodium: 142 mmol/L (ref 134–144)
Total Protein: 6.6 g/dL (ref 6.0–8.5)

## 2020-02-15 LAB — CBC WITH DIFFERENTIAL/PLATELET
Basophils Absolute: 0 10*3/uL (ref 0.0–0.2)
Basos: 1 %
EOS (ABSOLUTE): 0.1 10*3/uL (ref 0.0–0.4)
Eos: 4 %
Hematocrit: 41.4 % (ref 34.0–46.6)
Hemoglobin: 13.9 g/dL (ref 11.1–15.9)
Immature Grans (Abs): 0 10*3/uL (ref 0.0–0.1)
Immature Granulocytes: 0 %
Lymphocytes Absolute: 1.2 10*3/uL (ref 0.7–3.1)
Lymphs: 36 %
MCH: 30.9 pg (ref 26.6–33.0)
MCHC: 33.6 g/dL (ref 31.5–35.7)
MCV: 92 fL (ref 79–97)
Monocytes Absolute: 0.3 10*3/uL (ref 0.1–0.9)
Monocytes: 8 %
Neutrophils Absolute: 1.8 10*3/uL (ref 1.4–7.0)
Neutrophils: 51 %
Platelets: 190 10*3/uL (ref 150–450)
RBC: 4.5 x10E6/uL (ref 3.77–5.28)
RDW: 12.9 % (ref 11.7–15.4)
WBC: 3.5 10*3/uL (ref 3.4–10.8)

## 2020-02-15 LAB — MICROALBUMIN / CREATININE URINE RATIO
Creatinine, Urine: 186.5 mg/dL
Microalb/Creat Ratio: 3 mg/g creat (ref 0–29)
Microalbumin, Urine: 5.5 ug/mL

## 2020-02-15 LAB — HEMOGLOBIN A1C
Est. average glucose Bld gHb Est-mCnc: 123 mg/dL
Hgb A1c MFr Bld: 5.9 % — ABNORMAL HIGH (ref 4.8–5.6)

## 2020-02-15 LAB — LIPID PANEL
Chol/HDL Ratio: 3.8 ratio (ref 0.0–4.4)
Cholesterol, Total: 179 mg/dL (ref 100–199)
HDL: 47 mg/dL (ref >39)
LDL Chol Calc (NIH): 98 mg/dL (ref 0–99)
Triglycerides: 196 mg/dL — ABNORMAL HIGH (ref 0–149)
VLDL Cholesterol Cal: 34 mg/dL (ref 5–40)

## 2020-02-15 MED ORDER — SUTAB 1479-225-188 MG PO TABS: 1.0000 | ORAL_TABLET | ORAL | 0 refills | Status: DC

## 2020-02-15 NOTE — Patient Instructions (Signed)
Take over the counter Miralax daily.   Change and take your pantoprazole 40 mg daily, not as needed.   Patient advised to avoid spicy, acidic, citrus, chocolate, mints, fruit and fruit juices.  Limit the intake of caffeine, alcohol and Soda.  Don't exercise too soon after eating.  Don't lie down within 3-4 hours of eating.  Elevate the head of your bed.   Contact your primary care physician for a referral to an OBGYN.   You have been scheduled for an endoscopy and colonoscopy. Please follow the written instructions given to you at your visit today. Please pick up your prep supplies at the pharmacy within the next 1-3 days. If you use inhalers (even only as needed), please bring them with you on the day of your procedure.  Thank you for choosing me and [facility].  Pricilla Riffle. Dagoberto Ligas., MD., Marval Regal

## 2020-02-15 NOTE — Progress Notes (Signed)
History of Present Illness: This is a 59 year old female referred by Kristina Huffman* DO for the evaluation of GERD, alternating diarrhea and constipation, difficulty evacuating stool mild transaminase elevation.  She was evaluated by Dr. Michail Sermon, [facility], about 1 or 2 years ago and unfortunately we do not have his records available today.  She states she tried to bowel preps but was unable to tolerate them due to nausea and vomiting and her colonoscopy and EGD were Huffman performed.  She relates increasing difficulties evacuating stool over the past 6 to 9 months.  She generally has mild to moderate constipation although occasionally her stools are loose.  She has had long-term problems with GERD and frequent nocturnal GERD.  She takes pantoprazole as needed.  States he had a colonoscopy in 1993 that was normal.  She is not had a recent GYN exam. Denies weight loss, abdominal pain, change in stool caliber, melena, hematochezia, nausea, vomiting, dysphagia, chest pain.     Allergies  Allergen Reactions  . Sulfa Antibiotics Shortness Of Breath and Swelling    Tight in throat  . Aspirin Other (See Comments)    "Ringing in ears"   Outpatient Medications Prior to Visit  Medication Sig Dispense Refill  . Accu-Chek FastClix Lancets MISC Use to check FSBS BID. Dx code: E11.65 102 each 4  . atorvastatin (LIPITOR) 10 MG tablet Take 1 tablet (10 mg total) by mouth at bedtime. For high cholesterol 5 tablet 0  . B Complex-C (B-COMPLEX WITH VITAMIN C) tablet Take 1 tablet by mouth daily after supper.    . Cholecalciferol (VITAMIN D3 PO) Take 1 tablet by mouth daily after supper.    Marland Kitchen FLUoxetine (PROZAC) 40 MG capsule Take 1 capsule (40 mg total) by mouth daily. For depression 30 capsule 0  . gabapentin (NEURONTIN) 400 MG capsule Take 1 capsule (400 mg total) by mouth 4 (four) times daily. For agitation 120 capsule 0  . glucose blood (ACCU-CHEK GUIDE) test strip Use to check FSBS BID. Dx code:  E11.65 100 each 4  . hydrochlorothiazide (HYDRODIURIL) 25 MG tablet Take 1 tablet (25 mg total) by mouth daily. For high blood pressure 10 tablet 0  . Lancets Misc. (ACCU-CHEK FASTCLIX LANCET) KIT Use to check FSBS BID. Dx code: E11.65 1 kit 0  . MITIGARE 0.6 MG CAPS Take 0.6 mg by mouth daily as needed (gout).     . pantoprazole (PROTONIX) 40 MG tablet Take 1 tablet (40 mg total) by mouth daily as needed (heartburn). 90 tablet 1  . QUEtiapine (SEROQUEL) 200 MG tablet Take 200 mg by mouth at bedtime.     No facility-administered medications prior to visit.   Past Medical History:  Diagnosis Date  . Aneurysm of splenic artery (HCC)   . Anxiety   . Asthma   . Bipolar disorder ([place])   . Depression   . Diabetes mellitus without complication ([place])   . Diabetic peripheral neuropathy ([place])   . Elevated LFTs   . ETOH abuse   . Hepatitis C   . Heroin abuse ([place])   . History of MRSA infection    legs and spread to face  . Hypertension   . Insomnia   . Mixed hyperlipidemia   . OSA (obstructive sleep apnea)    Past Surgical History:  Procedure Laterality Date  . BACK SURGERY    . RADIAL HEAD ARTHROPLASTY  08/09/2012   Procedure: RADIAL HEAD ARTHROPLASTY;  Surgeon: Schuyler Amor, MD;  Location:  [facility];  Service: Orthopedics;  Laterality: Left;  Left Radial head Replacement  . WISDOM TOOTH EXTRACTION     Social History   Socioeconomic History  . Marital status: Married    Spouse name: Not on file  . Number of children: 4  . Years of education: Not on file  . Highest education level: Not on file  Occupational History  . Not on file  Tobacco Use  . Smoking status: Huffman Smoker  . Smokeless tobacco: Huffman Used  Substance and Sexual Activity  . Alcohol use: Yes    Comment: sometimes  . Drug use: No    Comment: no drug use for 3 years  . Sexual activity: Yes    Birth control/protection: None  Other Topics Concern  . Not on file  Social History Narrative  . Not on file    Social Determinants of Health   Financial Resource Strain:   . Difficulty of Paying Living Expenses:   Food Insecurity:   . Worried About Charity fundraiser in the Last Year:   . Arboriculturist in the Last Year:   Transportation Needs:   . Film/video editor (Medical):   Marland Kitchen Lack of Transportation (Non-Medical):   Physical Activity:   . Days of Exercise per Week:   . Minutes of Exercise per Session:   Stress:   . Feeling of Stress :   Social Connections:   . Frequency of Communication with Friends and Family:   . Frequency of Social Gatherings with Friends and Family:   . Attends Religious Services:   . Active Member of Clubs or Organizations:   . Attends Archivist Meetings:   Marland Kitchen Marital Status:    Family History  Problem Relation Age of Onset  . Depression Mother   . Osteoporosis Mother   . COPD Mother   . Rheum arthritis Mother   . Diabetes Father   . Hypertension Father   . Congestive Heart Failure Father   . Aneurysm Father   . Clotting disorder Father   . Heart disease Father   . Cancer Maternal Grandfather      Review of Systems: Pertinent positive and negative review of systems were noted in the above HPI section. All other review of systems were otherwise negative.   Physical Exam: General: Well developed, well nourished, no acute distress Head: Normocephalic and atraumatic Eyes:  sclerae anicteric, EOMI Ears: Normal auditory acuity Mouth: Not examined, mask on during Covid-19 pandemic Neck: Supple, no masses or thyromegaly Lungs: Clear throughout to auscultation Heart: Regular rate and rhythm; no murmurs, rubs or bruits Abdomen: Soft, non tender and non distended. No masses, hepatosplenomegaly or hernias noted. Normal Bowel sounds Rectal: Deferred to colonoscopy  Musculoskeletal: Symmetrical with no gross deformities  Skin: No lesions on visible extremities Pulses:  Normal pulses noted Extremities: No clubbing, cyanosis, edema or  deformities noted Neurological: Alert oriented x 4, grossly nonfocal Cervical Nodes:  No significant cervical adenopathy Inguinal Nodes: No significant inguinal adenopathy Psychological:  Alert and cooperative. Normal mood and affect   Assessment and Recommendations:  1.  Constipation, occasional diarrhea, difficulty with evacuation, change in bowel habits. Schedule colonoscopy.  Sutabs or MiraLAX Gatorade prep offered the risks (including bleeding, perforation, infection, missed lesions, medication reactions and possible hospitalization or surgery if complications occur), benefits, and alternatives to colonoscopy with possible biopsy and possible polypectomy were discussed with the patient and they consent to proceed.  Advised GYN evaluation with referral from PCP  to evaluate for possible rectocele and GYN, pelvic disorders.  2.  Left hepatic lobe cyst on CT and MR.  3.  Hepatic steatosis noted on prior CT and MR.  Mild transaminase elevations likely secondary to hepatic steatosis.  Monitor LFTs and proceed with standard hepatic serologies if LFT elevation is persistent.  4.  GERD. Change pantoprazole to 40 po qd (not prn). Follow antireflux measures. Schedule EGD. The risks (including bleeding, perforation, infection, missed lesions, medication reactions and possible hospitalization or surgery if complications occur), benefits, and alternatives to endoscopy with possible biopsy and possible dilation were discussed with the patient and they consent to proceed.       cc: Nicolette Bang, DO [address] [place],  [place] [address]

## 2020-02-17 NOTE — Progress Notes (Signed)
Patient notified of results & recommendations. Expressed understanding.

## 2020-02-28 ENCOUNTER — Telehealth: Payer: Self-pay

## 2020-02-28 NOTE — Telephone Encounter (Signed)
Patient called in stating that she would like to cancel her upcoming appointment with Dr. Juleen China & that she is planning on going back to her previous PCP. I asked if there was a reason for cancelling or if she was unhappy with her recent visit & she stated that she was leaving because of the receptionist. She says that her phone visit went well but when she came in for her labs she was met with an attitude. She said that the receptionist was on a personal call & seemed annoyed that she had to get off to check her in. She also says that she was yelled at across the lobby twice, once for her mask(says she was out of breath & trying to catch her breath) & the second time for moving a sign in one of the chairs.  She says that she's called patient relations & hadn't heard back so she wanted to cancel the appointment.  She then says that she would like to hold off on cancelling appointment until she speaks with a Freight forwarder. Best number to reach her at is the number on file [phone].

## 2020-03-08 ENCOUNTER — Telehealth: Payer: Medicaid Other | Admitting: Internal Medicine

## 2020-03-22 ENCOUNTER — Other Ambulatory Visit: Payer: Self-pay | Admitting: Internal Medicine

## 2020-03-22 DIAGNOSIS — R1031 Right lower quadrant pain: Secondary | ICD-10-CM

## 2020-03-23 ENCOUNTER — Other Ambulatory Visit: Payer: Self-pay | Admitting: Internal Medicine

## 2020-03-23 DIAGNOSIS — I728 Aneurysm of other specified arteries: Secondary | ICD-10-CM

## 2020-03-25 ENCOUNTER — Telehealth: Payer: Medicaid Other | Admitting: Family

## 2020-03-25 DIAGNOSIS — S90466D Insect bite (nonvenomous), unspecified lesser toe(s), subsequent encounter: Secondary | ICD-10-CM

## 2020-03-25 DIAGNOSIS — W57XXXD Bitten or stung by nonvenomous insect and other nonvenomous arthropods, subsequent encounter: Secondary | ICD-10-CM | POA: Diagnosis not present

## 2020-03-25 MED ORDER — CEPHALEXIN 500 MG PO CAPS
500.0000 mg | ORAL_CAPSULE | Freq: Two times a day (BID) | ORAL | 0 refills | Status: DC
Start: 2020-03-25 — End: 2020-05-08

## 2020-03-25 MED ORDER — PREDNISONE 20 MG PO TABS: 40.0000 mg | ORAL_TABLET | Freq: Every day | ORAL | 0 refills | Status: AC

## 2020-03-25 NOTE — Progress Notes (Signed)
E Visit for Insect Sting  Thank you for describing the insect sting for [place].  Here is how we plan to help!  A sting that we will treat with a short course of prednisone.  The 2 greatest risks from insect stings are allergic reaction, which can be fatal in some people and infection, which is more common and less serious.  Bees, wasps, yellow jackets, and hornets belong to a class of insects called Hymenoptera.  Most insect stings cause only minor discomfort.  Stings can happen anywhere on the body and can be painful.  Most stings are from honey bees or yellow jackets.  Fire ants can sting multiple times.  The sites of the stings are more likely to become infected.    I have sent in prednisone 40 mg by mouth daily for 5 days to the pharmacy you selected.  Please make sure that you selected a pharmacy that is open now. I have also sent in Keflex 500 mg three times a day for infection. Please follow up with your PCP if your symptoms worsen or do not improve.   What can be used to prevent Insect Stings?   Insect repellant with at least 20% DEET.    Wearing long pants and shirts with socks and shoes.    Wear dark or drab-colored clothes rather than bright colors.    Avoid using perfumes and hair sprays; these attract insects.  HOME CARE ADVICE:  1. Stinger removal:  The stinger looks like a tiny black dot in the sting.  Use a fingernail, credit card edge, or knife-edge to scrape it off.  Don't pull it out because it squeezes out more venom.  If the stinger is below the skin surface, leave it alone.  It will be shed with normal skin healing. 2. Use cold compresses to the area of the sting for 10-20 minutes.  You may repeat this as needed to relieve symptoms of pain and swelling. 3.  For pain relief, take acetominophen 650 mg 4-6 hours as needed or ibuprofen 400 mg every 6-8 hours as needed or naproxen 250-500 mg every 12 hours as needed. 4.  You can also use hydrocortisone cream 0.5%  or 1% up to 4 times daily as needed for itching. 5.  If the sting becomes very itchy, take Benadryl 25-50 mg, follow directions on box. 6.  Wash the area 2-3 times daily with antibacterial soap and warm water. 7. Call your Doctor if:  Fever, a severe headache, or rash occur in the next 2 weeks.  Sting area begins to look infected.  Redness and swelling worsens after home treatment.  Your current symptoms become worse.    MAKE SURE YOU:   Understand these instructions.  Will watch your condition.  Will get help right away if you are not doing well or get worse.  Thank you for choosing an e-visit. Your e-visit answers were reviewed by a board certified advanced clinical practitioner to complete your personal care plan. Depending upon the condition, your plan could have included both over the counter or prescription medications. Please review your pharmacy choice. Be sure that the pharmacy you have chosen is open so that you can pick up your prescription now.  If there is a problem you may message your provider in [place] to have the prescription routed to another pharmacy. Your safety is important to [place]. If you have drug allergies check your prescription carefully.  For the next 24 hours, you can use MyChart to  ask questions about today's visit, request a non-urgent call back, or ask for a work or school excuse from your e-visit provider. You will get an email in the next two days asking about your experience. I hope that your e-visit has been valuable and will speed your recovery.   Approximately 5 minutes was spent documenting and reviewing patient's chart.

## 2020-03-29 ENCOUNTER — Other Ambulatory Visit: Payer: Self-pay | Admitting: Gastroenterology

## 2020-03-29 ENCOUNTER — Ambulatory Visit (INDEPENDENT_AMBULATORY_CARE_PROVIDER_SITE_OTHER): Payer: Medicaid Other

## 2020-03-29 DIAGNOSIS — Z1159 Encounter for screening for other viral diseases: Secondary | ICD-10-CM

## 2020-03-30 LAB — SARS CORONAVIRUS 2 (TAT 6-24 HRS): SARS Coronavirus 2: NEGATIVE

## 2020-04-02 ENCOUNTER — Other Ambulatory Visit: Payer: Self-pay

## 2020-04-02 ENCOUNTER — Encounter: Payer: Medicaid Other | Admitting: Gastroenterology

## 2020-04-02 NOTE — Progress Notes (Signed)
Pt. Arrived via transportation without care partner,explained to pt. Per [facility] policy she needs a care partner here with her,informed Dr. Fuller Plan pt. Didn't have care partner he stated "she needs a care partner because of safety ". Explained to pt. It 's for her safety,she stated that she understands and that she called her pcp and they left a message on her voicemail that,they called Maryanna Shape and spoke with someone here and it  Was ok for her to come by transportation without care partner,no name of anyone left on voicemail of whom was spoken  To at [facility]auer,I encouraged pt. Any time she is unsure about something with a office she should call directly and speak with someone at that office personally,she verbalized understanding.pt. "stated I don't know anyone in [place] but I do have a friend who said she would come with me,pt. Will speak with friend and then she plans to call us back and reschedule after she has a chance to speak with her friend,scheduler # written down and given to pt. sutab sample given to pt.

## 2020-04-11 ENCOUNTER — Other Ambulatory Visit: Payer: Self-pay | Admitting: Internal Medicine

## 2020-04-11 ENCOUNTER — Ambulatory Visit
Admission: RE | Admit: 2020-04-11 | Discharge: 2020-04-11 | Disposition: A | Discharge status: Home / Self Care | Payer: Medicaid Other | Source: Ambulatory Visit | Attending: Internal Medicine | Admitting: Internal Medicine

## 2020-04-11 DIAGNOSIS — I728 Aneurysm of other specified arteries: Secondary | ICD-10-CM

## 2020-04-11 DIAGNOSIS — R1031 Right lower quadrant pain: Secondary | ICD-10-CM

## 2020-04-11 MED ORDER — IOPAMIDOL (ISOVUE-370) INJECTION 76%
75.0000 mL | Freq: Once | INTRAVENOUS | Status: AC | PRN
  Administered 2020-04-11: 75 mL via INTRAVENOUS

## 2020-04-27 ENCOUNTER — Ambulatory Visit
Admission: RE | Admit: 2020-04-27 | Discharge: 2020-04-27 | Disposition: A | Discharge status: Home / Self Care | Payer: Medicaid Other | Source: Ambulatory Visit | Attending: Internal Medicine | Admitting: Internal Medicine

## 2020-04-27 ENCOUNTER — Other Ambulatory Visit: Payer: Self-pay | Admitting: Internal Medicine

## 2020-04-27 ENCOUNTER — Other Ambulatory Visit: Payer: Self-pay

## 2020-04-27 DIAGNOSIS — M19079 Primary osteoarthritis, unspecified ankle and foot: Secondary | ICD-10-CM

## 2020-05-07 ENCOUNTER — Telehealth: Payer: Self-pay | Admitting: *Deleted

## 2020-05-07 NOTE — Telephone Encounter (Signed)
Patient called me as the oncall provider She is scheduled for [facility] tomorrow with Dr. Fuller Plan  She verified to me that she has completed the 2 shot COVID-19 vaccination series.  Her last immunization was greater than 3 weeks ago. I informed her she could proceed as scheduled with procedures tomorrow She voiced understanding and thanked me for the call

## 2020-05-07 NOTE — Telephone Encounter (Signed)
Pt was called and left voicemail to see either that she has been vaccinated and she can proceed with her Endocolon tomorrow or she will need to be rescheduled per our policy at [facility]. Dr. Fuller Plan notified.

## 2020-05-08 ENCOUNTER — Ambulatory Visit (AMBULATORY_SURGERY_CENTER): Payer: Medicaid Other | Admitting: Gastroenterology

## 2020-05-08 ENCOUNTER — Other Ambulatory Visit: Payer: Self-pay

## 2020-05-08 ENCOUNTER — Encounter: Payer: Self-pay | Admitting: Gastroenterology

## 2020-05-08 VITALS — BP 112/73 | HR 64 | Temp 96.6°F | Resp 17 | Ht 66.0 in | Wt 237.0 lb

## 2020-05-08 DIAGNOSIS — K319 Disease of stomach and duodenum, unspecified: Secondary | ICD-10-CM | POA: Diagnosis not present

## 2020-05-08 DIAGNOSIS — G4733 Obstructive sleep apnea (adult) (pediatric): Secondary | ICD-10-CM | POA: Insufficient documentation

## 2020-05-08 DIAGNOSIS — D125 Benign neoplasm of sigmoid colon: Secondary | ICD-10-CM

## 2020-05-08 DIAGNOSIS — D120 Benign neoplasm of cecum: Secondary | ICD-10-CM

## 2020-05-08 DIAGNOSIS — R194 Change in bowel habit: Secondary | ICD-10-CM

## 2020-05-08 DIAGNOSIS — K449 Diaphragmatic hernia without obstruction or gangrene: Secondary | ICD-10-CM

## 2020-05-08 DIAGNOSIS — K2960 Other gastritis without bleeding: Secondary | ICD-10-CM

## 2020-05-08 DIAGNOSIS — K219 Gastro-esophageal reflux disease without esophagitis: Secondary | ICD-10-CM

## 2020-05-08 MED ORDER — SODIUM CHLORIDE 0.9 % IV SOLN: 500.0000 mL | Freq: Once | INTRAVENOUS | Status: DC

## 2020-05-08 NOTE — Progress Notes (Signed)
Called to room to assist during endoscopic procedure.  Patient ID and intended procedure confirmed with present staff. Received instructions for my participation in the procedure from the performing physician.

## 2020-05-08 NOTE — Progress Notes (Signed)
pt tolerated well. VSS. awake and to recovery. Report given to RN. Bite block inserted and removed without trauma. 

## 2020-05-08 NOTE — Op Note (Signed)
[facility] Patient Name: Kristina Huffman Procedure Date: 05/08/2020 3:26 PM MRN: [id number] Endoscopist: Ladene Artist , MD Age: 59 Referring MD:  Date of Birth: Mar 23, 1961 Gender: Female Account #: [id number] Procedure:                Colonoscopy Indications:              Change in bowel habits Medicines:                Monitored Anesthesia Care Procedure:                Pre-Anesthesia Assessment:                           - Prior to the procedure, a History and Physical                            was performed, and patient medications and                            allergies were reviewed. The patient's tolerance of                            previous anesthesia was also reviewed. The risks                            and benefits of the procedure and the sedation                            options and risks were discussed with the patient.                            All questions were answered, and informed consent                            was obtained. Prior Anticoagulants: The patient has                            taken no previous anticoagulant or antiplatelet                            agents. ASA Grade Assessment: III - A patient with                            severe systemic disease. After reviewing the risks                            and benefits, the patient was deemed in                            satisfactory condition to undergo the procedure.                           After obtaining informed consent, the colonoscope  was passed under direct vision. Throughout the                            procedure, the patient's blood pressure, pulse, and                            oxygen saturations were monitored continuously. The                            Colonoscope was introduced through the anus and                            advanced to the the cecum, identified by                            appendiceal orifice and ileocecal valve.  The                            ileocecal valve, appendiceal orifice, and rectum                            were photographed. The quality of the bowel                            preparation was good. The colonoscopy was performed                            without difficulty. The patient tolerated the                            procedure well. Scope In: 3:34:51 PM Scope Out: 3:52:47 PM Scope Withdrawal Time: 0 hours 11 minutes 55 seconds  Total Procedure Duration: 0 hours 17 minutes 56 seconds  Findings:                 The perianal and digital rectal examinations were                            normal.                           A 9 mm polyp was found in the cecum. The polyp was                            semi-pedunculated. The polyp was removed with a                            cold snare. Resection and retrieval were complete.                           A 9 mm polyp was found in the sigmoid colon. The                            polyp was sessile. The polyp was removed with a  cold snare. Resection and retrieval were complete.                           The exam was otherwise without abnormality on                            direct and retroflexion views. Complications:            No immediate complications. Estimated blood loss:                            None. Estimated Blood Loss:     Estimated blood loss: none. Impression:               - One 9 mm polyp in the cecum, removed with a cold                            snare. Resected and retrieved.                           - One 9 mm polyp in the sigmoid colon, removed with                            a cold snare. Resected and retrieved.                           - The examination was otherwise normal on direct                            and retroflexion views. Recommendation:           - Repeat colonoscopy after studies are complete for                            surveillance based on pathology results.                            - Patient has a contact number available for                            emergencies. The signs and symptoms of potential                            delayed complications were discussed with the                            patient. Return to normal activities tomorrow.                            Written discharge instructions were provided to the                            patient.                           - Resume previous diet.                           -  Continue present medications.                           - Await pathology results.                           - No aspirin, ibuprofen, naproxen, or other                            non-steroidal anti-inflammatory drugs for 2 weeks                            after polyp removal. Ladene Artist, MD 05/08/2020 3:55:35 PM This report has been signed electronically.

## 2020-05-08 NOTE — Op Note (Signed)
[facility] Patient Name: Kristina Huffman Procedure Date: 05/08/2020 3:25 PM MRN: [id number] Endoscopist: Ladene Artist , MD Age: 59 Referring MD:  Date of Birth: 12/20/1960 Gender: Female Account #: [id number] Procedure:                Upper GI endoscopy Indications:              Gastro-esophageal reflux disease Medicines:                Monitored Anesthesia Care Procedure:                Pre-Anesthesia Assessment:                           - Prior to the procedure, a History and Physical                            was performed, and patient medications and                            allergies were reviewed. The patient's tolerance of                            previous anesthesia was also reviewed. The risks                            and benefits of the procedure and the sedation                            options and risks were discussed with the patient.                            All questions were answered, and informed consent                            was obtained. Prior Anticoagulants: The patient has                            taken no previous anticoagulant or antiplatelet                            agents. ASA Grade Assessment: III - A patient with                            severe systemic disease. After reviewing the risks                            and benefits, the patient was deemed in                            satisfactory condition to undergo the procedure.                           After obtaining informed consent, the endoscope was  passed under direct vision. Throughout the                            procedure, the patient's blood pressure, pulse, and                            oxygen saturations were monitored continuously. The                            Endoscope was introduced through the mouth, and                            advanced to the second part of duodenum. The upper                            GI endoscopy was  accomplished without difficulty.                            The patient tolerated the procedure well. Scope In: Scope Out: Findings:                 The examined esophagus was normal.                           Three localized medium erosions with no bleeding                            and no stigmata of recent bleeding were found on                            the greater curvature of the stomach. Biopsies were                            taken with a cold forceps for histology.                           Patchy mildly erythematous mucosa without bleeding                            was found in the gastric body and in the gastric                            antrum. Biopsies were taken with a cold forceps for                            histology.                           A small hiatal hernia was present.                           The exam of the stomach was otherwise normal.                           The  duodenal bulb and second portion of the                            duodenum were normal. Complications:            No immediate complications. Estimated Blood Loss:     Estimated blood loss was minimal. Impression:               - Normal esophagus.                           - Erosive gastropathy with no bleeding and no                            stigmata of recent bleeding. Biopsied.                           - Erythematous mucosa in the gastric body and                            antrum. Biopsied.                           - Small hiatal hernia.                           - Normal duodenal bulb and second portion of the                            duodenum. Recommendation:           - Patient has a contact number available for                            emergencies. The signs and symptoms of potential                            delayed complications were discussed with the                            patient. Return to normal activities tomorrow.                            Written discharge  instructions were provided to the                            patient.                           - Resume previous diet.                           - Antireflux measures long term.                           - Continue present medications including  pantoprazole 40 mg po qam for 2 months then may use                            qam prn.                           - Avoid or at least minimize NSAID useage.                           - Await pathology results. Ladene Artist, MD 05/08/2020 4:08:01 PM This report has been signed electronically.

## 2020-05-08 NOTE — Progress Notes (Signed)
History reviewed today  VS/Temp, [place]

## 2020-05-08 NOTE — Patient Instructions (Signed)
YOU HAD AN ENDOSCOPIC PROCEDURE TODAY AT THE [facility]:   Refer to the procedure report that was given to you for any specific questions about what was found during the examination.  If the procedure report does not answer your questions, please call your gastroenterologist to clarify.  If you requested that your care partner not be given the details of your procedure findings, then the procedure report has been included in a sealed envelope for you to review at your convenience later.  YOU SHOULD EXPECT: Some feelings of bloating in the abdomen. Passage of more gas than usual.  Walking can help get rid of the air that was put into your GI tract during the procedure and reduce the bloating. If you had a lower endoscopy (such as a colonoscopy or flexible sigmoidoscopy) you may notice spotting of blood in your stool or on the toilet paper. If you underwent a bowel prep for your procedure, you may not have a normal bowel movement for a few days.  Please Note:  You might notice some irritation and congestion in your nose or some drainage.  This is from the oxygen used during your procedure.  There is no need for concern and it should clear up in a day or so.  SYMPTOMS TO REPORT IMMEDIATELY:   Following lower endoscopy (colonoscopy or flexible sigmoidoscopy):  Excessive amounts of blood in the stool  Significant tenderness or worsening of abdominal pains  Swelling of the abdomen that is new, acute  Fever of 100F or higher   Following upper endoscopy (EGD)  Vomiting of blood or coffee ground material  New chest pain or pain under the shoulder blades  Painful or persistently difficult swallowing  New shortness of breath  Fever of 100F or higher  Black, tarry-looking stools  For urgent or emergent issues, a gastroenterologist can be reached at any hour by calling [phone]. Do not use MyChart messaging for urgent concerns.    DIET:  We do recommend a small meal at first, but  then you may proceed to your regular diet.  Drink plenty of fluids but you should avoid alcoholic beverages for 24 hours.  ACTIVITY:  You should plan to take it easy for the rest of today and you should NOT DRIVE or use heavy machinery until tomorrow (because of the sedation medicines used during the test).    FOLLOW UP: Our staff will call the number listed on your records 48-72 hours following your procedure to check on you and address any questions or concerns that you may have regarding the information given to you following your procedure. If we do not reach you, we will leave a message.  We will attempt to reach you two times.  During this call, we will ask if you have developed any symptoms of COVID 19. If you develop any symptoms (ie: fever, flu-like symptoms, shortness of breath, cough etc.) before then, please call [phone].  If you test positive for Covid 19 in the 2 weeks post procedure, please call and report this information to us.    If any biopsies were taken you will be contacted by phone or by letter within the next 1-3 weeks.  Please call us at [phone] if you have not heard about the biopsies in 3 weeks.    SIGNATURES/CONFIDENTIALITY: You and/or your care partner have signed paperwork which will be entered into your electronic medical record.  These signatures attest to the fact that that the information above on   your After Visit Summary has been reviewed and is understood.  Full responsibility of the confidentiality of this discharge information lies with you and/or your care-partner. 

## 2020-05-10 ENCOUNTER — Telehealth: Payer: Self-pay

## 2020-05-10 NOTE — Telephone Encounter (Signed)
Attempted to reach patient for post-procedure f/u call. No answer. Left message for her to please not hesitate to call us if she has any questions/concerns regarding her care. 

## 2020-05-10 NOTE — Telephone Encounter (Signed)
NO ANSWER, MESSAGE LEFT FOR PATIENT. 

## 2020-05-14 ENCOUNTER — Encounter: Payer: Self-pay | Admitting: Gastroenterology

## 2020-05-21 ENCOUNTER — Telehealth: Payer: Self-pay | Admitting: Gastroenterology

## 2020-05-21 NOTE — Telephone Encounter (Signed)
Left message for patient to call back Per office note on 02/15/20 patient was to ask her PCP to make referral to GYN

## 2020-05-22 NOTE — Telephone Encounter (Signed)
I left a detailed message for the patient that she will need to contact her PCP for referral.  All Medicaid referrals must come from PCP

## 2020-06-13 ENCOUNTER — Ambulatory Visit (INDEPENDENT_AMBULATORY_CARE_PROVIDER_SITE_OTHER): Payer: Medicaid Other

## 2020-06-13 ENCOUNTER — Other Ambulatory Visit: Payer: Self-pay

## 2020-06-13 ENCOUNTER — Ambulatory Visit (INDEPENDENT_AMBULATORY_CARE_PROVIDER_SITE_OTHER): Payer: Medicaid Other | Admitting: Podiatry

## 2020-06-13 DIAGNOSIS — M7752 Other enthesopathy of left foot: Secondary | ICD-10-CM

## 2020-06-13 DIAGNOSIS — M10072 Idiopathic gout, left ankle and foot: Secondary | ICD-10-CM

## 2020-06-13 NOTE — Progress Notes (Signed)
   HPI: 59 y.o. female presenting today as a new patient for evaluation of left great toe pain.  She states that she does have a history of gout.  She states that the left great toe pain began approximately 2 days ago.  She has been taking colchicine for the pain.  She presents for further treatment evaluation  Past Medical History:  Diagnosis Date  . Aneurysm of splenic artery (HCC)   . Anxiety   . Asthma   . Bipolar disorder ([place])   . Depression   . Diabetes mellitus without complication ([place])   . Diabetic peripheral neuropathy ([place])   . Elevated LFTs   . ETOH abuse   . GERD (gastroesophageal reflux disease)   . Hepatitis C   . Heroin abuse ([place])   . History of MRSA infection    legs and spread to face  . Hypertension   . Insomnia   . Mixed hyperlipidemia   . OSA (obstructive sleep apnea)   . Sleep apnea      Physical Exam: General: The patient is alert and oriented x3 in no acute distress.  Dermatology: Skin is warm, dry and supple bilateral lower extremities. Negative for open lesions or macerations.  Vascular: Palpable pedal pulses bilaterally.  Moderate edema and erythema noted to the first MTPJ of the left foot. Capillary refill within normal limits.  Neurological: Epicritic and protective threshold grossly intact bilaterally.   Musculoskeletal Exam: Range of motion within normal limits to all pedal and ankle joints bilateral. Muscle strength 5/5 in all groups bilateral.  Erythema plus edema plus pain on palpation and range of motion to the first MTPJ of the left foot consistent with findings of gout  Radiographic Exam:  Normal osseous mineralization. Joint spaces preserved. No fracture/dislocation/boney destruction.  There is some radiolucencies around the first MTPJ of the right foot possible subchondral cysts  Assessment: 1.  Acute idiopathic gout left great toe joint   Plan of Care:  1. Patient evaluated. X-Rays reviewed.  2.  Injection of 0.5 cc Celestone  Soluspan injected in the first MTPJ left foot 3.  Continue colchicine daily as per PCP 4.  Postsurgical shoe dispensed.  Weightbearing as tolerated 5.  The patient does have recurrent gout attacks.  She may benefit from prophylactic allopurinol 100 mg daily.  She states that she has an appointment with her PCP on Friday, 06/15/2020.  I will defer the management of the allopurinol to him 6.  Return to clinic as needed       Edrick Kins, DPM [facility]  Dr. Edrick Kins, DPM    [address], [place] [address]                Office [phone]  Fax [phone]

## 2020-06-22 ENCOUNTER — Other Ambulatory Visit: Payer: Self-pay | Admitting: Internal Medicine

## 2020-06-22 DIAGNOSIS — Z1231 Encounter for screening mammogram for malignant neoplasm of breast: Secondary | ICD-10-CM

## 2020-06-25 ENCOUNTER — Other Ambulatory Visit: Payer: Self-pay

## 2020-06-25 ENCOUNTER — Ambulatory Visit (INDEPENDENT_AMBULATORY_CARE_PROVIDER_SITE_OTHER): Payer: Medicaid Other | Admitting: Obstetrics & Gynecology

## 2020-06-25 ENCOUNTER — Encounter: Payer: Self-pay | Admitting: Obstetrics & Gynecology

## 2020-06-25 ENCOUNTER — Other Ambulatory Visit (HOSPITAL_COMMUNITY)
Admission: RE | Admit: 2020-06-25 | Discharge: 2020-06-25 | Disposition: A | Discharge status: Home / Self Care | Payer: Medicaid Other | Source: Ambulatory Visit | Attending: Obstetrics & Gynecology | Admitting: Obstetrics & Gynecology

## 2020-06-25 VITALS — BP 108/76 | HR 85 | Temp 98.8°F | Wt 238.0 lb

## 2020-06-25 DIAGNOSIS — K5900 Constipation, unspecified: Secondary | ICD-10-CM | POA: Diagnosis not present

## 2020-06-25 DIAGNOSIS — N898 Other specified noninflammatory disorders of vagina: Secondary | ICD-10-CM

## 2020-06-25 NOTE — Patient Instructions (Signed)

## 2020-06-25 NOTE — Progress Notes (Signed)
Patient ID: Kiyona Mcnall, female   DOB: 01/14/1961, 59 y.o.   MRN: [id number]  Chief Complaint  Patient presents with   vulva lesion  assess for possible rectocele  HPI Nico Rogness is a 59 y.o. female.  [id number]. She is postmenopause, s/p BTL. She was referred due to constipation and difficulty evacuating her rectum without leaning back while sitting. Colonoscopy in August did not mention any rectocele but referral for evaluation was made in May.. No c/o sx of pelvic prolapse  HPI  Past Medical History:  Diagnosis Date   Aneurysm of splenic artery (HCC)    Anxiety    Asthma    Bipolar disorder ([place])    Depression    Diabetes mellitus without complication (HCC)    Diabetic peripheral neuropathy (HCC)    Elevated LFTs    ETOH abuse    GERD (gastroesophageal reflux disease)    Hepatitis C    Heroin abuse ([place])    History of MRSA infection    legs and spread to face   Hypertension    Insomnia    Mixed hyperlipidemia    OSA (obstructive sleep apnea)    Sleep apnea     Past Surgical History:  Procedure Laterality Date   BACK SURGERY     RADIAL HEAD ARTHROPLASTY  08/09/2012   Procedure: RADIAL HEAD ARTHROPLASTY;  Surgeon: Schuyler Amor, MD;  Location: [facility];  Service: Orthopedics;  Laterality: Left;  Left Radial head Replacement   WISDOM TOOTH EXTRACTION      Family History  Problem Relation Age of Onset   Depression Mother    Osteoporosis Mother    COPD Mother    Rheum arthritis Mother    Diabetes Father    Hypertension Father    Congestive Heart Failure Father    Aneurysm Father    Clotting disorder Father    Heart disease Father    Cancer Maternal Grandfather    Colon cancer Neg Hx    Esophageal cancer Neg Hx    Rectal cancer Neg Hx    Stomach cancer Neg Hx     Social History Social History   Tobacco Use   Smoking status: Never Smoker   Smokeless tobacco: Never Used  Scientific laboratory technician Use: Never used   Substance Use Topics   Alcohol use: Not Currently   Drug use: Not Currently    Types: Heroin    Comment: no drug use for 3 years    Allergies  Allergen Reactions   Sulfa Antibiotics Shortness Of Breath and Swelling    Tight in throat   Aspirin Other (See Comments)    "Ringing in ears"    Current Outpatient Medications  Medication Sig Dispense Refill   atorvastatin (LIPITOR) 10 MG tablet Take 1 tablet (10 mg total) by mouth at bedtime. For high cholesterol (Patient taking differently: Take 20 mg by mouth at bedtime. For high cholesterol. PT states today the dose was increased to 62m) 5 tablet 0   B Complex-C (B-COMPLEX WITH VITAMIN C) tablet Take 1 tablet by mouth daily after supper.     Cholecalciferol (VITAMIN D3 PO) Take 1 tablet by mouth daily after supper.     FLUoxetine (PROZAC) 40 MG capsule Take 1 capsule (40 mg total) by mouth daily. For depression 30 capsule 0   gabapentin (NEURONTIN) 400 MG capsule Take 1 capsule (400 mg total) by mouth 4 (four) times daily. For agitation (Patient taking differently: Take 600 mg by mouth 3 (three)  times daily. For agitation) 120 capsule 0   hydrochlorothiazide (HYDRODIURIL) 25 MG tablet Take 1 tablet (25 mg total) by mouth daily. For high blood pressure 10 tablet 0   ondansetron (ZOFRAN-ODT) 4 MG disintegrating tablet ondansetron 4 mg disintegrating tablet  Place 1 tablet 3 times a day by translingual route as needed.     pantoprazole (PROTONIX) 40 MG tablet Take 1 tablet (40 mg total) by mouth daily as needed (heartburn). 90 tablet 1   QUEtiapine (SEROQUEL) 200 MG tablet Take 200 mg by mouth at bedtime.     Accu-Chek FastClix Lancets MISC Use to check FSBS BID. Dx code: E11.65 102 each 4   glucose blood (ACCU-CHEK GUIDE) test strip Use to check FSBS BID. Dx code: E11.65 100 each 4   Lancets Misc. (ACCU-CHEK FASTCLIX LANCET) KIT Use to check FSBS BID. Dx code: E11.65 1 kit 0   MITIGARE 0.6 MG CAPS Take 0.6 mg by mouth daily  as needed (gout).  (Patient not taking: Reported on 06/25/2020)     Current Facility-Administered Medications  Medication Dose Route Frequency Provider Last Rate Last Admin   0.9 %  sodium chloride infusion  500 mL Intravenous Once Ladene Artist, MD        Review of Systems Review of Systems  Constitutional: Negative.   Respiratory: Negative.   Gastrointestinal: Positive for constipation and rectal pain.  Genitourinary: Positive for vaginal pain (irritation). Negative for frequency, pelvic pain and urgency.    Blood pressure 108/76, pulse 85, temperature 98.8 F (37.1 C), weight 238 lb (108 kg), last menstrual period 08/02/2013.  Physical Exam Physical Exam Vitals and nursing note reviewed. Exam conducted with a chaperone present.  Constitutional:      Appearance: She is obese. She is not ill-appearing.  HENT:     Head: Normocephalic.  Pulmonary:     Effort: Pulmonary effort is normal.  Abdominal:     General: There is no distension.     Palpations: There is no mass.  Genitourinary:    General: Normal vulva.     Vagina: Vaginal discharge (scant with mild to mod atrophy) present.     Comments: Cervix well supported and no significant rectocele or cystocele. External hemorrhoid  Neurological:     Mental Status: She is alert.   no rectal mass  Data Reviewed Colonoscopy report   Assessment Constipation, unspecified constipation type  Vaginal discharge - Plan: Cervicovaginal ancillary only( [place])  No evidence of rectocele  Plan Continue bowel management as prescribed, f/u with GI as recommended, PCP    [name] 06/25/2020, 4:52 PM

## 2020-06-26 LAB — CERVICOVAGINAL ANCILLARY ONLY
Bacterial Vaginitis (gardnerella): POSITIVE — AB
Candida Glabrata: NEGATIVE
Candida Vaginitis: NEGATIVE
Chlamydia: NEGATIVE
Comment: NEGATIVE
Comment: NEGATIVE
Comment: NEGATIVE
Comment: NEGATIVE
Comment: NEGATIVE
Comment: NORMAL
Neisseria Gonorrhea: NEGATIVE
Trichomonas: NEGATIVE

## 2020-06-27 ENCOUNTER — Telehealth: Payer: Self-pay | Admitting: Family Medicine

## 2020-06-27 NOTE — Telephone Encounter (Signed)
Patient want a call back, state there is information on her After Visit Summary that's not correct

## 2020-06-28 ENCOUNTER — Telehealth: Payer: Self-pay | Admitting: Obstetrics & Gynecology

## 2020-06-28 ENCOUNTER — Other Ambulatory Visit: Payer: Self-pay | Admitting: Obstetrics & Gynecology

## 2020-06-28 DIAGNOSIS — N760 Acute vaginitis: Secondary | ICD-10-CM

## 2020-06-28 MED ORDER — METRONIDAZOLE 500 MG PO TABS: 500.0000 mg | ORAL_TABLET | Freq: Two times a day (BID) | ORAL | 0 refills | Status: AC

## 2020-06-28 NOTE — Telephone Encounter (Signed)
Addressed in another encounter

## 2020-06-28 NOTE — Telephone Encounter (Signed)
Returned patients call. She did not answer. LM for her to call the office at he convenience and that I will send a message to her My Chart.

## 2020-06-28 NOTE — Telephone Encounter (Signed)
Patient called into the office wanting to speak to someone about her visit on 10/4. Patient stated that she would like to know what she was treated for. Patient's phone went out before I could  Get more information from her.

## 2020-07-02 ENCOUNTER — Telehealth: Payer: Self-pay | Admitting: Obstetrics & Gynecology

## 2020-07-02 NOTE — Telephone Encounter (Signed)
Patient is requesting to speak to a nurse. She stated it was about the visit she had.

## 2020-07-04 NOTE — Telephone Encounter (Signed)
Called pt and left message stating that I am returning her call in order to answer her questions. I stated that a MyChart message was sent to her yesterday which contains information that will probably answer her questions. If she still has questions, she may send a response message back to [place] via MyChart.

## 2020-07-19 ENCOUNTER — Encounter: Payer: Self-pay | Admitting: *Deleted

## 2020-07-20 ENCOUNTER — Other Ambulatory Visit: Payer: Self-pay | Admitting: Lactation Services

## 2020-07-20 MED ORDER — METRONIDAZOLE 500 MG PO TABS
500.0000 mg | ORAL_TABLET | Freq: Two times a day (BID) | ORAL | 0 refills | Status: DC
Start: 2020-07-20 — End: 2020-09-24

## 2020-07-20 NOTE — Progress Notes (Signed)
Patient with continued discharge. She was not able to finish Flagyl previously since she dropped down the sink. Flagyl reordered per standing order.

## 2020-09-03 ENCOUNTER — Other Ambulatory Visit: Payer: Self-pay

## 2020-09-03 ENCOUNTER — Telehealth: Payer: Self-pay | Admitting: Obstetrics & Gynecology

## 2020-09-03 ENCOUNTER — Ambulatory Visit (HOSPITAL_COMMUNITY)
Admission: EM | Admit: 2020-09-03 | Discharge: 2020-09-03 | Disposition: A | Discharge status: Home / Self Care | Payer: Medicaid Other

## 2020-09-03 NOTE — Telephone Encounter (Signed)
Received a call from the patient requesting to see Dr Roselie Awkward. Was not able to come in today, but was given the next available appointment. She wanted to know if she could go to an Urgent care to be seen for this. Requesting a call back today.

## 2020-09-04 NOTE — Telephone Encounter (Signed)
Returned patients call. Patient did not answer. LM for patient to call the office at her convenience.

## 2020-09-05 ENCOUNTER — Telehealth: Payer: Self-pay | Admitting: Obstetrics & Gynecology

## 2020-09-05 DIAGNOSIS — B9689 Other specified bacterial agents as the cause of diseases classified elsewhere: Secondary | ICD-10-CM

## 2020-09-05 NOTE — Telephone Encounter (Signed)
Pt called back, missed call from RN. Pt states that the Flagyl is not working and needs to talk to someone to see if there is an alternative medication or something else to do to help. Pt also states that she doesn't think she can wait until 09-17-20( her appt date) for this issue. Needs to know if she needs to go to urgent care or not. Please call pt back per her request, states she will try to answer but if not you can leave detailed message on voicemail. thanks

## 2020-09-06 MED ORDER — TINIDAZOLE 500 MG PO TABS: 2.0000 g | ORAL_TABLET | Freq: Every day | ORAL | 0 refills | Status: AC

## 2020-09-06 NOTE — Telephone Encounter (Signed)
Called patient stating I am returning her phone call. Patient reports ongoing BV infection despite recent treatment two different times with flagyl. Patient would like alternative medication sent in before next appt. Discussed tindamax Rx and sent medication to pharmacy. Patient verbalized understanding.

## 2020-09-06 NOTE — Addendum Note (Signed)
Addended by: Shelly Coss on: 09/06/2020 04:50 PM   Modules accepted: Orders

## 2020-09-17 ENCOUNTER — Ambulatory Visit: Payer: Medicaid Other | Admitting: Obstetrics & Gynecology

## 2020-09-23 ENCOUNTER — Telehealth: Payer: Medicaid Other | Admitting: Family

## 2020-09-23 ENCOUNTER — Encounter (HOSPITAL_COMMUNITY): Payer: Self-pay | Admitting: Emergency Medicine

## 2020-09-23 ENCOUNTER — Other Ambulatory Visit: Payer: Self-pay

## 2020-09-23 ENCOUNTER — Ambulatory Visit (HOSPITAL_COMMUNITY)
Admission: EM | Admit: 2020-09-23 | Discharge: 2020-09-24 | Disposition: A | Discharge status: Home / Self Care | Payer: Medicaid Other | Attending: Psychiatry | Admitting: Psychiatry

## 2020-09-23 DIAGNOSIS — R45851 Suicidal ideations: Secondary | ICD-10-CM | POA: Diagnosis not present

## 2020-09-23 DIAGNOSIS — Z20822 Contact with and (suspected) exposure to covid-19: Secondary | ICD-10-CM | POA: Insufficient documentation

## 2020-09-23 DIAGNOSIS — F3160 Bipolar disorder, current episode mixed, unspecified: Secondary | ICD-10-CM | POA: Insufficient documentation

## 2020-09-23 DIAGNOSIS — N899 Noninflammatory disorder of vagina, unspecified: Secondary | ICD-10-CM

## 2020-09-23 LAB — CBC WITH DIFFERENTIAL/PLATELET
Abs Immature Granulocytes: 0.02 10*3/uL (ref 0.00–0.07)
Basophils Absolute: 0.1 10*3/uL (ref 0.0–0.1)
Basophils Relative: 1 %
Eosinophils Absolute: 0.1 10*3/uL (ref 0.0–0.5)
Eosinophils Relative: 2 %
HCT: 44.2 % (ref 36.0–46.0)
Hemoglobin: 15.4 g/dL — ABNORMAL HIGH (ref 12.0–15.0)
Immature Granulocytes: 0 %
Lymphocytes Relative: 36 %
Lymphs Abs: 2.2 10*3/uL (ref 0.7–4.0)
MCH: 31.7 pg (ref 26.0–34.0)
MCHC: 34.8 g/dL (ref 30.0–36.0)
MCV: 90.9 fL (ref 80.0–100.0)
Monocytes Absolute: 0.4 10*3/uL (ref 0.1–1.0)
Monocytes Relative: 6 %
Neutro Abs: 3.3 10*3/uL (ref 1.7–7.7)
Neutrophils Relative %: 55 %
Platelets: 232 10*3/uL (ref 150–400)
RBC: 4.86 MIL/uL (ref 3.87–5.11)
RDW: 12.8 % (ref 11.5–15.5)
WBC: 6.1 10*3/uL (ref 4.0–10.5)
nRBC: 0 % (ref 0.0–0.2)

## 2020-09-23 LAB — POC SARS CORONAVIRUS 2 AG: SARS Coronavirus 2 Ag: NEGATIVE

## 2020-09-23 LAB — POC SARS CORONAVIRUS 2 AG -  ED: SARS Coronavirus 2 Ag: NEGATIVE

## 2020-09-23 LAB — ETHANOL: Alcohol, Ethyl (B): 245 mg/dL — ABNORMAL HIGH (ref <10)

## 2020-09-23 MED ORDER — TRAZODONE HCL 50 MG PO TABS
50.0000 mg | ORAL_TABLET | Freq: Every evening | ORAL | Status: DC | PRN
  Administered 2020-09-23: 50 mg via ORAL
  Filled 2020-09-23: qty 7
  Filled 2020-09-23: qty 1

## 2020-09-23 MED ORDER — HYDROXYZINE HCL 25 MG PO TABS: 25.0000 mg | ORAL_TABLET | Freq: Three times a day (TID) | ORAL | Status: DC | PRN

## 2020-09-23 MED ORDER — MAGNESIUM HYDROXIDE 400 MG/5ML PO SUSP: 30.0000 mL | Freq: Every day | ORAL | Status: DC | PRN

## 2020-09-23 MED ORDER — PRAZOSIN HCL 1 MG PO CAPS
1.0000 mg | ORAL_CAPSULE | Freq: Once | ORAL | Status: AC
  Administered 2020-09-23: 1 mg via ORAL
  Filled 2020-09-23: qty 1

## 2020-09-23 MED ORDER — ALUM & MAG HYDROXIDE-SIMETH 200-200-20 MG/5ML PO SUSP: 30.0000 mL | ORAL | Status: DC | PRN

## 2020-09-23 MED ORDER — HYDROXYZINE HCL 25 MG PO TABS
50.0000 mg | ORAL_TABLET | Freq: Once | ORAL | Status: AC
  Administered 2020-09-23: 50 mg via ORAL
  Filled 2020-09-23: qty 2

## 2020-09-23 MED ORDER — ACETAMINOPHEN 325 MG PO TABS
650.0000 mg | ORAL_TABLET | Freq: Four times a day (QID) | ORAL | Status: DC | PRN
  Administered 2020-09-24 (×2): 650 mg via ORAL
  Filled 2020-09-23 (×2): qty 2

## 2020-09-23 NOTE — ED Triage Notes (Signed)
Presents with suicidal ideations, anxiety, no plan noted.  Pt reports stressor of sons passing in 2012.  Pt anxious and tearful.

## 2020-09-23 NOTE — ED Notes (Signed)
Pt is awake tossing and turning in the bed will continue to monitor for safety

## 2020-09-23 NOTE — ED Notes (Signed)
Pt calmer at present, resting at present.  Tolerated salad and sandwich.  No distress noted, monitoring for safety.  Skin search completed.

## 2020-09-23 NOTE — BH Assessment (Signed)
Comprehensive Clinical Assessment (CCA) Note  09/23/2020 Kristina Huffman [id number]  Chief Complaint: Bipolar I disorder, Current or most recent episode manic, With psychotic features Chief Complaint  Patient presents with  . Suicidal   Visit Diagnosis:  Bipolar I disorder, Current or most recent episode manic, With psychotic features   Kristina Huffman is 60 years old pt who presents voluntarily to [facility], accompanied with a friend (Heidi), who drop her off at the behavioral Urgent center. Pt presents with suicidal ideations, tearful, and agitated "I cannot continued to live".  Pt appeared to be intoxicated, belligerent, aggressive and used excessive profantiy towards staff and personnel. Pt turned over table and chairs, while waiting in triage room.  Pt reports that she don't have a plan to carry out suicide.  Pt reports that she hear voices in her head and have constant nightmares.  Pt reported that she have not slept in three days.  Pt reports that she have not ate in three days.  When clinician ask what type of voices - inside the head or outside the room, she reported 'they are in my f--king head". Pt denies current homicidal ideation.  Pt reports that she has been drinking alcohol, unable to report the amount, duration and frequency; also denies any other substance use.    Pt identifies her primary stressor as grief and loss, and past mistakes, which causes her to self medicate with alcohol. Pt reports a history of bipolar disorder.  Pt refused to answer questions about abuse, trauma, or current legal problems.  Pt reports that she is seeing a psychiatric, no weekly outpatient therapy.  Also reported that she is taking mental medication.  Pt is dressed not age appropriate and dishevel, alert, oriented x 3 with hostile speech and restless motor behavior.  Eye contact was staring, and tearful.  Pt mood is depressed and affect blunted.  Thought process was irrational.  Pt's insight was poor and  judgmental was poor.  Pt was defensive, dramatic, resistant and sarcastic throughout assessment.    Disposition [name] [name] pt meets inpatient criteria, [facility] contacted and pt will be seen for medical clearance.  Disposition discussed with [name], via secure chat in epic.    CCA Screening, Triage and Referral (STR)  Patient Reported Information How did you hear about [place]? Family/Friend  Referral name: Pt reports her friend Neurosurgeon) brought her in  Referral phone number: 0 (UTA)   Whom do you see for routine medical problems? Other (Comment) (Pt reports that she see a psychiatrist)  Practice/Facility Name: No data recorded Practice/Facility Phone Number: No data recorded Name of Contact: No data recorded Contact Number: No data recorded Contact Fax Number: No data recorded Prescriber Name: No data recorded Prescriber Address (if known): No data recorded  What Is the Reason for Your Visit/Call Today? No data recorded How Long Has This Been Causing You Problems? -- (UTA)  What Do You Feel Would Help You the Most Today? -- (Pt reports that she needs help.)   Have You Recently Been in Any Inpatient Treatment (Hospital/Detox/Crisis Center/28-Day Program)? -- (UTA)  Name/Location of Program/Hospital:No data recorded How Long Were You There? No data recorded When Were You Discharged? No data recorded  Have You Ever Received Services From [facility] Before? -- (UTA)  Who Do You See at [facility]? No data recorded  Have You Recently Had Any Thoughts About Hurting Yourself? Yes  Are You Planning to Commit Suicide/Harm Yourself At This time? Yes   Have  you Recently Had Thoughts About [place]? Yes  Explanation: No data recorded  Have You Used Any Alcohol or Drugs in the Past 24 Hours? Yes  How Long Ago Did You Use Drugs or Alcohol? 0000 (UTA)  What Did You Use and How Much? Alcohol   Do You Currently Have a Therapist/Psychiatrist? Yes  Name of  Therapist/Psychiatrist: Pt reports that she see a psychiatrist   Have You Been Recently Discharged From Any Office Practice or Programs? -- (UTA)  Explanation of Discharge From Practice/Program: No data recorded    CCA Screening Triage Referral Assessment Type of Contact: Face-to-Face  Is this Initial or Reassessment? No data recorded Date Telepsych consult ordered in [facility]:  No data recorded Time Telepsych consult ordered in [facility]:  No data recorded  Patient Reported Information Reviewed? Yes  Patient Left Without Being Seen? No data recorded Reason for Not Completing Assessment: No data recorded  Collateral Involvement: Pt reported her friend  [name] brought her in and left.   Does Patient Have a Stage manager Guardian? No data recorded Name and Contact of Legal Guardian: No data recorded If Minor and Not Living with Parent(s), Who has Custody? -- (N/A)  Is CPS involved or ever been involved? Never  Is APS involved or ever been involved? -- (UTA)   Patient Determined To Be At Risk for Harm To Self or Others Based on Review of Patient Reported Information or Presenting Complaint? Yes, for Self-Harm (Pt is appears to be intoxicated, have become belligerent, aggressive towards staff and personnel. Pt throwing tables and chairs.)  Method: No data recorded Availability of Means: No data recorded Intent: No data recorded Notification Required: No data recorded Additional Information for Danger to Others Potential: No data recorded Additional Comments for Danger to Others Potential: No data recorded Are There Guns or Other Weapons in Your Home? No data recorded Types of Guns/Weapons: No data recorded Are These Weapons Safely Secured?                            No data recorded Who Could Verify You Are Able To Have These Secured: No data recorded Do You Have any Outstanding Charges, Pending Court Dates, Parole/Probation? No data recorded Contacted To Inform of Risk of Harm  To Self or Others: No data recorded  Location of Assessment: [facility] Assessment [facility]   Does Patient Present under Involuntary Commitment? No  IVC Papers Initial File Date: No data recorded  [place] of Residence: [place]   Patient Currently Receiving the Following Services: -- (UTA)   Determination of Need: Urgent (48 hours)   Options For Referral: Inpatient Hospitalization     CCA Biopsychosocial Intake/Chief Complaint:  Alcohol Intoxication an Bipolard  Current Symptoms/Problems: Impulse control, irritable, argumentative   Patient Reported Schizophrenia/Schizoaffective Diagnosis in Past: -- (UTA)   Strengths: -- (UTA)  Preferences: -- (UTA)  Abilities: -- (UTA)   Type of Services Patient Feels are Needed: -- (UTA)   Initial Clinical Notes/Concerns: Pt is appears intoxticated, belligerent, aggresstive, throwing furniture Leisure centre manager and tables) and using profanity towards staff and personnel.   Mental Health Symptoms Depression:  Irritability; Tearfulness   Duration of Depressive symptoms: Greater than two weeks   Mania:  Racing thoughts; Recklessness; Increased Energy; Irritability   Anxiety:   None   Psychosis:  Grossly disorganized or catatonic behavior   Duration of Psychotic symptoms: No data recorded  Trauma:  None   Obsessions:  No  data recorded  Compulsions:  Poor Insight   Inattention:  None   Hyperactivity/Impulsivity:  Blurts out answers; Fidgets with hands/feet; Feeling of restlessness; Talks excessively   Oppositional/Defiant Behaviors:  Aggression towards people/animals; Angry; Argumentative; Defies rules; Easily annoyed   Emotional Irregularity:  Chronic feelings of emptiness; Frantic efforts to avoid abandonment; Intense/inappropriate anger; Mood lability; Potentially harmful impulsivity; Recurrent suicidal behaviors/gestures/threats; Unstable self-image   Other Mood/Personality Symptoms:  No data recorded   Mental Status  Exam Appearance and self-care  Stature:  Average   Weight:  Overweight   Clothing:  Careless/inappropriate; Disheveled   Grooming:  Neglected   Cosmetic use:  None   Posture/gait:  Normal   Motor activity:  Agitated; Repetitive; Restless   Sensorium  Attention:  Confused   Concentration:  Scattered; Focuses on irrelevancies   Orientation:  Object; Person; Place   Recall/memory:  Defective in Short-term   Affect and Mood  Affect:  Anxious; Blunted; Tearful   Mood:  Anxious; Depressed; Hypomania; Irritable; Worthless   Relating  Eye contact:  None   Facial expression:  Angry; Anxious; Responsive; Sad; Tense   Attitude toward examiner:  Argumentative; Resistant; Sarcastic; Uninterested; Defensive; Dramatic; Hostile; Irritable   Thought and Language  Speech flow: Flight of Ideas; Loud; Pressured; Profane   Thought content:  Suspicious; Delusions   Preoccupation:  Suicide   Hallucinations:  Auditory   Organization:  No data recorded  [employer] of Knowledge:  Poor   Intelligence:  Needs investigation   Abstraction:  -- (UTA)   Judgement:  Poor   Reality Testing:  Distorted   Insight:  Poor   Decision Making:  Impulsive   Social Functioning  Social Maturity:  Impulsive   Social Judgement:  -- (UTA)   Stress  Stressors:  Grief/losses   Coping Ability:  Deficient supports; Overwhelmed   Skill Deficits:  Self-care; Self-control   Supports:  -- Special educational needs teacher)     Religion: Religion/Spirituality Are You A Religious Person?:  (N/A)  Leisure/Recreation:    Exercise/Diet: Exercise/Diet Do You Exercise?:  (UTA) Have You Gained or Lost A Significant Amount of Weight in the Past Six Months?:  (UTA) Do You Follow a Special Diet?:  (Pt reported that she have not been eating x 2 days.) Do You Have Any Trouble Sleeping?: Yes Explanation of Sleeping Difficulties: Pt reported that she have not slept x3 days.   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 held a job?:  Special educational needs teacher) Where was the patient employed at that time?:  Special educational needs teacher)  Education: Education Is Patient Currently Attending School?:  (UTA) Last Grade Completed:  (UTA) Name of [place]:  (UTA) Did You Graduate From [employer]?:  (UTA) Did [place]?:  (UTA) Did You Attend Graduate School?:  (UTA) Did You Have Any Special Interests In School?:  (UTA) Did You Have An Individualized Education Program (IIEP):  (UTA) Did You Have Any Difficulty At School?:  (UTA) Patient's Education Has Been Impacted by Current Illness:  (UTA)   CCA Family/Childhood History Family and Relationship History: Family history Are you sexually active?:  (UTA) What is your sexual orientation?:  (UTA) Has your sexual activity been affected by drugs, alcohol, medication, or emotional stress?:  (UTA) Does patient have children?: Yes How many children?: 1 How is patient's relationship with their children?: Pt reported that she has a  deceased son  Childhood History:  Childhood History By whom was/is the patient raised?:  (UTA) Additional childhood history information:  (UTA) Description of patient's relationship with caregiver when they were a child:  (UTA) Patient's description of current relationship with people who raised him/her:  (UTA) How were you disciplined when you got in trouble as a child/adolescent?:  (UTA) Does patient have siblings?:  (UTA) Did patient suffer any verbal/emotional/physical/sexual abuse as a child?:  (UTA) Did patient suffer from severe childhood neglect?:  (UTA) Has patient ever been sexually abused/assaulted/raped as an adolescent or adult?:  (UTA) Was the patient ever a victim of a crime or a disaster?:  (UTA) Witnessed domestic violence?:  (UTA) Has patient  been affected by domestic violence as an adult?:  Special educational needs teacher)  Child/Adolescent Assessment:     CCA Substance Use Alcohol/Drug Use: Alcohol / Drug Use Pain Medications: See MRA Prescriptions: See MRA Over the Counter: See MRA History of alcohol / drug use?: Yes Longest period of sobriety (when/how long): UTA Negative Consequences of Use: Personal relationships Withdrawal Symptoms: Agitation,Aggressive/Assaultive Substance #1 Name of Substance 1: Alcohol 1 - Age of First Use: UTA 1 - Amount (size/oz): UTA 1 - Frequency: UTA 1 - Duration: UTA 1 - Last Use / Amount: UTA                       ASAM's:  Six Dimensions of Multidimensional Assessment  Dimension 1:  Acute Intoxication and/or Withdrawal Potential:      Dimension 2:  Biomedical Conditions and Complications:      Dimension 3:  Emotional, Behavioral, or Cognitive Conditions and Complications:     Dimension 4:  Readiness to Change:     Dimension 5:  Relapse, Continued use, or Continued Problem Potential:     Dimension 6:  Recovery/Living Environment:     ASAM Severity Score:    ASAM Recommended Level of Treatment:     Substance use Disorder (SUD)    Recommendations for Services/Supports/Treatments:    DSM5 Diagnoses: Patient Active Problem List   Diagnosis Date Noted  . Acute respiratory failure due to COVID-19 ([place]) 10/14/2019  . ARF (acute renal failure) ([place]) 10/14/2019  . Essential hypertension 10/14/2019  . Controlled type 2 diabetes mellitus with hyperglycemia ([place]) 10/14/2019  . Alcohol use disorder, severe, dependence ([place]) 06/06/2018  . Chronic hepatitis C without hepatic coma ([place]) 06/04/2016  . Substance induced mood disorder ([place]) 09/20/2013  . Benzodiazepine dependence ([place]) 09/21/2011  . Bipolar 1 disorder, mixed, moderate ([place]) 09/21/2011  . PTSD (post-traumatic stress disorder) 09/21/2011       Referrals to Alternative Service(s): Referred to Alternative Service(s):   Place:    Date:   Time:    Referred to Alternative Service(s):   Place:   Date:   Time:    Referred to Alternative Service(s):   Place:   Date:   Time:    Referred to Alternative Service(s):   Place:   Date:   Time:     Leonides Schanz, Counselor

## 2020-09-23 NOTE — Progress Notes (Signed)
Based on what you shared with me, I feel your condition warrants further evaluation and I recommend that you be seen for a face to face office visit.  Given your recurrent symptoms, you need to be seen face to face.   NOTE: If you entered your credit card information for this eVisit, you will not be charged. You may see a "hold" on your card for the $35 but that hold will drop off and you will not have a charge processed.   If you are having a true medical emergency please call 911.      For an urgent face to face visit, [place] has five urgent care centers for your convenience:     [facility] Ur[facility] Center at [facility] Directions [phone] [address] [place], [place] [address] . 10 am - 6pm Monday - Friday    [facility] Urgent Care Center [facility]) Get Driving Directions [phone] [address] [place], [place] [address] . 10 am to 8 pm Monday-Friday . 12 pm to 8 pm [facility] Urgent Care at [facility] Get Driving Directions [phone] [address], [address] [place], [place] [address] . 8 am to 8 pm Monday-Friday . 9 am to 6 pm Saturday . 11 am to 6 pm Sunday     [facility] Urgent Care at [facility] Get Driving Directions  [phone] [address] [place], [place] [address] . 8 am to 8 pm Monday-Friday . 8 am to 4 pm [facility] Urgent Care at [facility] Directions [phone] [address] [place], [place] [address] . 12 pm to 6 pm Monday-Friday      Your e-visit answers were reviewed by a board certified advanced clinical practitioner to complete your personal care plan.  Thank you for using e-Visits.

## 2020-09-23 NOTE — ED Notes (Signed)
Pt very agitated, knocking over furniture and throwing water.  PA Eddie and staff, security in room to speak with and  Monitor pt.  Meds given.

## 2020-09-23 NOTE — BH Assessment (Incomplete)
Comprehensive Clinical Assessment (CCA) Note  09/23/2020 Kristina Huffman [id number]  Chief Complaint: Bipolar I disorder, Current or most recent episode manic, With psychotic features Chief Complaint  Patient presents with  . Suicidal   Visit Diagnosis:  Bipolar I disorder, Current or most recent episode manic, With psychotic features   Kristina Huffman is 60 years old pt who presents voluntarily to [facility], accompanied with a friend (Kristina Huffman), who drop her off at the behavioral Urgent center. Pt presents with suicidal ideations, tearful, and agitated "I cannot continued to live".  Pt appeared to be intoxicated, belligerent, aggressive and used excessive profantiy towards staff and personnel. Pt turned over table and chairs, while waiting in triage room.  Pt reports that she hear voices in her head and have constant nightmares.  Pt reported that she not slept in three days.  Pt reports that she have not eating in three days.  When clinician ask what type of voices ( inside head) or outside the room, she reported 'they are in my f--king head". Pt denies current homicidal ideation.  Pt reports that has has been drinking alcohol, unable to report the amount, duration and frequency; also denies any other substance use.    Pt identifies her primary stressor as grief and loss, and past mistakes, which causes her to self medicate with alcohol. Pt reports a history of bipolar disorder.  Pt refused to answer questions about abuse, trauma, or current legal problems.  Pt reports that she is see a psychiatric, now weekly outpatient therapy.  Also reported that she is taking mental medication.  Pt is dressed not age appropriate and dishevel, alert, oriented x 3 with hostile speech and restless motor behavior.  Eye contact was fine, and tearful.  Pt mood depressed and affect blunted.  Thought process was irrational.  Pt's insight was poor and judgmental was fair.  Pt was defensive, dramatic, resistant and sarcastic  throughout assessment.    Disposition Trinna Post [name] pt meets inpatient criteria, [facility] AC contacted and pt will be seen for medical clearance.  Disposition discussed with Henry Russel, via secure chat in epic.    CCA Screening, Triage and Referral (STR)  Patient Reported Information How did you hear about [place]? Family/Friend  Referral name: Pt reports her friend Dealer) brought her in  Referral phone number: [phone] (UTA)   Whom do you see for routine medical problems? Other (Comment) (Pt reports that she see a psychiatrist)  Practice/Facility Name: No data recorded Practice/Facility Phone Number: No data recorded Name of Contact: No data recorded Contact Number: No data recorded Contact Fax Number: No data recorded Prescriber Name: No data recorded Prescriber Address (if known): No data recorded  What Is the Reason for Your Visit/Call Today? No data recorded How Long Has This Been Causing You Problems? -- (UTA)  What Do You Feel Would Help You the Most Today? -- (Pt reports that she needs help.)   Have You Recently Been in Any Inpatient Treatment (Hospital/Detox/Crisis Center/28-Day Program)? -- (UTA)  Name/Location of Program/Hospital:No data recorded How Long Were You There? No data recorded When Were You Discharged? No data recorded  Have You Ever Received Services From [facility] Before? -- (UTA)  Who Do You See at [facility]? No data recorded  Have You Recently Had Any Thoughts About Hurting Yourself? Yes  Are You Planning to Commit Suicide/Harm Yourself At This time? Yes   Have you Recently Had Thoughts About Hurting Someone Gu[place]upe Dawn? Yes  Explanation: No data recorded  Have  You Used Any Alcohol or Drugs in the Past 24 Hours? Yes  How Long Ago Did You Use Drugs or Alcohol? 0000 (UTA)  What Did You Use and How Much? Alcohol   Do You Currently Have a Therapist/Psychiatrist? Yes  Name of Therapist/Psychiatrist: Pt reports that she see a psychiatrist   Have You  Been Recently Discharged From Any Office Practice or Programs? -- (UTA)  Explanation of Discharge From Practice/Program: No data recorded    CCA Screening Triage Referral Assessment Type of Contact: Face-to-Face  Is this Initial or Reassessment? No data recorded Date Telepsych consult ordered in [facility]:  No data recorded Time Telepsych consult ordered in [facility]:  No data recorded  Patient Reported Information Reviewed? Yes  Patient Left Without Being Seen? No data recorded Reason for Not Completing Assessment: No data recorded  Collateral Involvement: Pt reported her friend  Kristina Huffman brought her in and left.   Does Patient Have a Stage manager Guardian? No data recorded Name and Contact of Legal Guardian: No data recorded If Minor and Not Living with Parent(s), Who has Custody? -- (N/A)  Is CPS involved or ever been involved? Never  Is APS involved or ever been involved? -- (UTA)   Patient Determined To Be At Risk for Harm To Self or Others Based on Review of Patient Reported Information or Presenting Complaint? Yes, for Self-Harm (Pt is appears to be intoxicated, have become belligerent, aggressive towards staff and personnel. Pt throwing tables and chairs.)  Method: No data recorded Availability of Means: No data recorded Intent: No data recorded Notification Required: No data recorded Additional Information for Danger to Others Potential: No data recorded Additional Comments for Danger to Others Potential: No data recorded Are There Guns or Other Weapons in Your Home? No data recorded Types of Guns/Weapons: No data recorded Are These Weapons Safely Secured?                            No data recorded Who Could Verify You Are Able To Have These Secured: No data recorded Do You Have any Outstanding Charges, Pending Court Dates, Parole/Probation? No data recorded Contacted To Inform of Risk of Harm To Self or Others: No data recorded  Location of Assessment: [facility]  Assessment Services   Does Patient Present under Involuntary Commitment? No  IVC Papers Initial File Date: No data recorded  [place] of Residence: [place]   Patient Currently Receiving the Following Services: -- (UTA)   Determination of Need: Urgent (48 hours)   Options For Referral: Inpatient Hospitalization     CCA Biopsychosocial Intake/Chief Complaint:  Alcohol Intoxication an Bipolard  Current Symptoms/Problems: Impulse control, irritable, argumentative   Patient Reported Schizophrenia/Schizoaffective Diagnosis in Past: -- (UTA)   Strengths: -- (UTA)  Preferences: -- (UTA)  Abilities: -- (UTA)   Type of Services Patient Feels are Needed: -- (UTA)   Initial Clinical Notes/Concerns: Pt is appears intoxticated, belligerent, aggresstive, throwing furniture Leisure centre manager and tables) and using profanity towards staff and personnel.   Mental Health Symptoms Depression:  Irritability; Tearfulness   Duration of Depressive symptoms: Greater than two weeks   Mania:  Racing thoughts; Recklessness; Increased Energy; Irritability   Anxiety:   None   Psychosis:  Grossly disorganized or catatonic behavior   Duration of Psychotic symptoms: No data recorded  Trauma:  None   Obsessions:  No data recorded  Compulsions:  Poor Insight   Inattention:  None   Hyperactivity/Impulsivity:  Blurts out answers; Fidgets with hands/feet; Feeling of restlessness; Talks excessively   Oppositional/Defiant Behaviors:  Aggression towards people/animals; Angry; Argumentative; Defies rules; Easily annoyed   Emotional Irregularity:  Chronic feelings of emptiness; Frantic efforts to avoid abandonment; Intense/inappropriate anger; Mood lability; Potentially harmful impulsivity; Recurrent suicidal behaviors/gestures/threats; Unstable self-image   Other Mood/Personality Symptoms:  No data recorded   Mental Status Exam Appearance and self-care  Stature:  Average   Weight:  Overweight    Clothing:  Careless/inappropriate; Disheveled   Grooming:  Neglected   Cosmetic use:  None   Posture/gait:  Normal   Motor activity:  Agitated; Repetitive; Restless   Sensorium  Attention:  Confused   Concentration:  Scattered; Focuses on irrelevancies   Orientation:  Object; Person; Place   Recall/memory:  Defective in Short-term   Affect and Mood  Affect:  Anxious; Blunted; Tearful   Mood:  Anxious; Depressed; Hypomania; Irritable; Worthless   Relating  Eye contact:  None   Facial expression:  Angry; Anxious; Responsive; Sad; Tense   Attitude toward examiner:  Argumentative; Resistant; Sarcastic; Uninterested; Defensive; Dramatic; Hostile; Irritable   Thought and Language  Speech flow: Flight of Ideas; Loud; Pressured; Profane   Thought content:  Suspicious; Delusions   Preoccupation:  Suicide   Hallucinations:  Auditory   Organization:  No data recorded  [employer] of Knowledge:  Poor   Intelligence:  Needs investigation   Abstraction:  -- (UTA)   Judgement:  Poor   Reality Testing:  Distorted   Insight:  Poor   Decision Making:  Impulsive   Social Functioning  Social Maturity:  Impulsive   Social Judgement:  -- (UTA)   Stress  Stressors:  Grief/losses   Coping Ability:  Deficient supports; Overwhelmed   Skill Deficits:  Self-care; Self-control   Supports:  -- Industrial/product designer)     Religion: Religion/Spirituality Are You A Religious Person?:  (N/A)  Leisure/Recreation:    Exercise/Diet: Exercise/Diet Do You Exercise?:  (UTA) Have You Gained or Lost A Significant Amount of Weight in the Past Six Months?:  (UTA) Do You Follow a Special Diet?:  (Pt reported that she have not been eating x 2 days.) Do You Have Any Trouble Sleeping?: Yes Explanation of Sleeping Difficulties: Pt reported that she have not slept x3 days.   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 that time?:  Industrial/product designer)  Education: Education Is Patient Currently Attending School?:  (UTA) Last Grade Completed:  (UTA) Name of High School:  (UTA) Did You Graduate From [employer]?:  (UTA) Did You Attend College?:  (UTA) Did You Attend Graduate School?:  (UTA) Did You Have Any Special Interests In School?:  (UTA) Did You Have An Individualized Education Program (IIEP):  (UTA) Did You Have Any Difficulty At School?:  (UTA) Patient's Education Has Been Impacted by Current Illness:  (UTA)   CCA Family/Childhood History Family and Relationship History: Family history Are you sexually active?:  (UTA) What is your sexual orientation?:  (UTA) Has your sexual activity been affected by drugs, alcohol, medication, or emotional stress?:  (UTA) Does patient have children?: Yes How many children?: 1 How is patient's relationship with their children?: Pt reported that she has a deceased son  Childhood History:  Childhood History By whom was/is the patient raised?:  (UTA)  Additional childhood history information:  (UTA) Description of patient's relationship with caregiver when they were a child:  (UTA) Patient's description of current relationship with people who raised him/her:  (UTA) How were you disciplined when you got in trouble as a child/adolescent?:  (UTA) Does patient have siblings?:  (UTA) Did patient suffer any verbal/emotional/physical/sexual abuse as a child?:  (UTA) Did patient suffer from severe childhood neglect?:  (UTA) Has patient ever been sexually abused/assaulted/raped as an adolescent or adult?:  (UTA) Was the patient ever a victim of a crime or a disaster?:  (UTA) Witnessed domestic violence?:  (UTA) Has patient been affected by domestic violence as an adult?:  Special educational needs teacher)  Child/Adolescent Assessment:     CCA  Substance Use Alcohol/Drug Use: Alcohol / Drug Use Pain Medications: See MRA Prescriptions: See MRA Over the Counter: See MRA History of alcohol / drug use?: Yes Longest period of sobriety (when/how long): UTA Negative Consequences of Use: Personal relationships Withdrawal Symptoms: Agitation,Aggressive/Assaultive Substance #1 Name of Substance 1: Alcohol 1 - Age of First Use: UTA 1 - Amount (size/oz): UTA 1 - Frequency: UTA 1 - Duration: UTA 1 - Last Use / Amount: UTA                       ASAM's:  Six Dimensions of Multidimensional Assessment  Dimension 1:  Acute Intoxication and/or Withdrawal Potential:      Dimension 2:  Biomedical Conditions and Complications:      Dimension 3:  Emotional, Behavioral, or Cognitive Conditions and Complications:     Dimension 4:  Readiness to Change:     Dimension 5:  Relapse, Continued use, or Continued Problem Potential:     Dimension 6:  Recovery/Living Environment:     ASAM Severity Score:    ASAM Recommended Level of Treatment:     Substance use Disorder (SUD)    Recommendations for Services/Supports/Treatments:    DSM5 Diagnoses: Patient Active Problem List   Diagnosis Date Noted  . Acute respiratory failure due to COVID-19 ([place]) 10/14/2019  . ARF (acute renal failure) ([place]) 10/14/2019  . Essential hypertension 10/14/2019  . Controlled type 2 diabetes mellitus with hyperglycemia ([place]) 10/14/2019  . Alcohol use disorder, severe, dependence ([place]) 06/06/2018  . Chronic hepatitis C without hepatic coma ([place]) 06/04/2016  . Substance induced mood disorder ([place]) 09/20/2013  . Benzodiazepine dependence ([place]) 09/21/2011  . Bipolar 1 disorder, mixed, moderate ([place]) 09/21/2011  . PTSD (post-traumatic stress disorder) 09/21/2011       Referrals to Alternative Service(s): Referred to Alternative Service(s):   Place:   Date:   Time:    Referred to Alternative Service(s):   Place:   Date:   Time:    Referred to  Alternative Service(s):   Place:   Date:   Time:    Referred to Alternative Service(s):   Place:   Date:   Time:     Leonides Schanz, Counselor

## 2020-09-24 LAB — LIPID PANEL
Cholesterol: 207 mg/dL — ABNORMAL HIGH (ref 0–200)
HDL: 49 mg/dL (ref >40)
LDL Cholesterol: UNDETERMINED mg/dL (ref 0–99)
Total CHOL/HDL Ratio: 4.2 RATIO
Triglycerides: 477 mg/dL — ABNORMAL HIGH (ref <150)
VLDL: UNDETERMINED mg/dL (ref 0–40)

## 2020-09-24 LAB — COMPREHENSIVE METABOLIC PANEL
ALT: 99 U/L — ABNORMAL HIGH (ref 0–44)
AST: 184 U/L — ABNORMAL HIGH (ref 15–41)
Albumin: 4.5 g/dL (ref 3.5–5.0)
Alkaline Phosphatase: 99 U/L (ref 38–126)
Anion gap: 17 — ABNORMAL HIGH (ref 5–15)
BUN: 16 mg/dL (ref 6–20)
CO2: 21 mmol/L — ABNORMAL LOW (ref 22–32)
Calcium: 10.1 mg/dL (ref 8.9–10.3)
Chloride: 96 mmol/L — ABNORMAL LOW (ref 98–111)
Creatinine, Ser: 0.82 mg/dL (ref 0.44–1.00)
GFR, Estimated: >60 mL/min (ref >60)
Glucose, Bld: 111 mg/dL — ABNORMAL HIGH (ref 70–99)
Potassium: 4.1 mmol/L (ref 3.5–5.1)
Sodium: 134 mmol/L — ABNORMAL LOW (ref 135–145)
Total Bilirubin: 1.5 mg/dL — ABNORMAL HIGH (ref 0.3–1.2)
Total Protein: 7.2 g/dL (ref 6.5–8.1)

## 2020-09-24 LAB — TSH: TSH: 3.115 u[IU]/mL (ref 0.350–4.500)

## 2020-09-24 LAB — RESP PANEL BY RT-PCR (FLU A&B, COVID) ARPGX2
Influenza A by PCR: NEGATIVE
Influenza B by PCR: NEGATIVE
SARS Coronavirus 2 by RT PCR: NEGATIVE

## 2020-09-24 LAB — LDL CHOLESTEROL, DIRECT: Direct LDL: 95.3 mg/dL (ref 0–99)

## 2020-09-24 LAB — POCT URINE DRUG SCREEN - MANUAL ENTRY (I-SCREEN)
POC Amphetamine UR: NOT DETECTED
POC Buprenorphine (BUP): NOT DETECTED
POC Cocaine UR: NOT DETECTED
POC Marijuana UR: NOT DETECTED
POC Methadone UR: NOT DETECTED
POC Methamphetamine UR: NOT DETECTED
POC Morphine: NOT DETECTED
POC Oxazepam (BZO): NOT DETECTED
POC Oxycodone UR: NOT DETECTED
POC Secobarbital (BAR): NOT DETECTED

## 2020-09-24 LAB — POCT PREGNANCY, URINE: Preg Test, Ur: NEGATIVE

## 2020-09-24 LAB — HEMOGLOBIN A1C
Hgb A1c MFr Bld: 5.5 % (ref 4.8–5.6)
Mean Plasma Glucose: 111.15 mg/dL

## 2020-09-24 MED ORDER — PRAZOSIN HCL 1 MG PO CAPS
1.0000 mg | ORAL_CAPSULE | Freq: Every day | ORAL | Status: DC
  Filled 2020-09-24: qty 7

## 2020-09-24 MED ORDER — LORAZEPAM 1 MG PO TABS: 1.0000 mg | ORAL_TABLET | Freq: Four times a day (QID) | ORAL | Status: DC | PRN

## 2020-09-24 MED ORDER — THIAMINE HCL 100 MG PO TABS: 100.0000 mg | ORAL_TABLET | Freq: Every day | ORAL | Status: DC

## 2020-09-24 MED ORDER — TRAZODONE HCL 50 MG PO TABS: 50.0000 mg | ORAL_TABLET | Freq: Every evening | ORAL | 0 refills | Status: DC | PRN

## 2020-09-24 MED ORDER — FLUOXETINE HCL 40 MG PO CAPS: 40.0000 mg | ORAL_CAPSULE | Freq: Every day | ORAL | 0 refills | Status: DC

## 2020-09-24 MED ORDER — GABAPENTIN 600 MG PO TABS: 600.0000 mg | ORAL_TABLET | Freq: Three times a day (TID) | ORAL | 0 refills | Status: DC

## 2020-09-24 MED ORDER — HYDROCHLOROTHIAZIDE 25 MG PO TABS
25.0000 mg | ORAL_TABLET | Freq: Every day | ORAL | Status: DC
  Administered 2020-09-24: 25 mg via ORAL
  Filled 2020-09-24: qty 7
  Filled 2020-09-24: qty 1

## 2020-09-24 MED ORDER — HYDROXYZINE HCL 25 MG PO TABS: 25.0000 mg | ORAL_TABLET | Freq: Four times a day (QID) | ORAL | Status: DC | PRN

## 2020-09-24 MED ORDER — PRAZOSIN HCL 1 MG PO CAPS: 1.0000 mg | ORAL_CAPSULE | Freq: Every day | ORAL | 0 refills | Status: DC

## 2020-09-24 MED ORDER — ADULT MULTIVITAMIN W/MINERALS CH
1.0000 | ORAL_TABLET | Freq: Every day | ORAL | Status: DC
  Administered 2020-09-24: 1 via ORAL
  Filled 2020-09-24: qty 1

## 2020-09-24 MED ORDER — QUETIAPINE FUMARATE 200 MG PO TABS: 200.0000 mg | ORAL_TABLET | Freq: Every day | ORAL | 0 refills | Status: DC

## 2020-09-24 MED ORDER — THIAMINE HCL 100 MG/ML IJ SOLN
100.0000 mg | Freq: Once | INTRAMUSCULAR | Status: AC
  Administered 2020-09-24: 100 mg via INTRAMUSCULAR
  Filled 2020-09-24: qty 2

## 2020-09-24 MED ORDER — FLUOXETINE HCL 20 MG PO CAPS
40.0000 mg | ORAL_CAPSULE | Freq: Every day | ORAL | Status: DC
  Administered 2020-09-24: 40 mg via ORAL
  Filled 2020-09-24: qty 14
  Filled 2020-09-24: qty 2

## 2020-09-24 MED ORDER — QUETIAPINE FUMARATE 200 MG PO TABS: 200.0000 mg | ORAL_TABLET | Freq: Every day | ORAL | Status: DC

## 2020-09-24 MED ORDER — HYDROCHLOROTHIAZIDE 25 MG PO TABS: 25.0000 mg | ORAL_TABLET | Freq: Every day | ORAL | 0 refills | Status: DC

## 2020-09-24 MED ORDER — HYDROXYZINE HCL 25 MG PO TABS: 25.0000 mg | ORAL_TABLET | Freq: Three times a day (TID) | ORAL | Status: DC | PRN

## 2020-09-24 MED ORDER — QUETIAPINE FUMARATE 100 MG PO TABS: 100.0000 mg | ORAL_TABLET | Freq: Two times a day (BID) | ORAL | 0 refills | Status: DC

## 2020-09-24 MED ORDER — QUETIAPINE FUMARATE 100 MG PO TABS
100.0000 mg | ORAL_TABLET | Freq: Two times a day (BID) | ORAL | Status: DC
  Administered 2020-09-24: 100 mg via ORAL
  Filled 2020-09-24: qty 28
  Filled 2020-09-24: qty 1

## 2020-09-24 MED ORDER — ONDANSETRON 4 MG PO TBDP: 4.0000 mg | ORAL_TABLET | Freq: Four times a day (QID) | ORAL | Status: DC | PRN

## 2020-09-24 MED ORDER — GABAPENTIN 600 MG PO TABS
600.0000 mg | ORAL_TABLET | Freq: Three times a day (TID) | ORAL | Status: DC
  Administered 2020-09-24: 600 mg via ORAL
  Filled 2020-09-24: qty 21
  Filled 2020-09-24: qty 1

## 2020-09-24 MED ORDER — LOPERAMIDE HCL 2 MG PO CAPS: 2.0000 mg | ORAL_CAPSULE | ORAL | Status: DC | PRN

## 2020-09-24 NOTE — ED Notes (Signed)
Patient A&O x 4, ambulatory. Patient discharged in no acute distress. Patient denied SI/HI, A/VH upon discharge. Patient verbalized understanding of all discharge instructions explained by staff, to include follow up appointments, RX's and safety plan. Pt agree to transport to [facility]. Pt belongings returned to patient from locker    intact. Patient escorted to lobby via staff for transport to destination. Safety maintained.

## 2020-09-24 NOTE — ED Notes (Signed)
Given sandwich. 

## 2020-09-24 NOTE — ED Provider Notes (Signed)
Behavioral Health Admission H&P [facility] & OBS)  Date: 09/24/20 Patient Name: Kristina Huffman MRN: [id number] Chief Complaint:  Chief Complaint  Patient presents with  . Suicidal   Chief Complaint/Presenting Problem: Alcohol Intoxication an [place]  Diagnoses:  Final diagnoses:  None    HPI:  Kristina Huffman is a 60 year old female with a past psychiatric history significant for Bipolar disorder who presents to [facility] due to suicide ideation. During the encounter, patient was tearful and boisterous. Patient engaged in inflammatory language when conversing with the writer often using expletives such as "fuck you." When asked about the inciting event that brought her here, patient stated that her son died in Jan 04, 2018. Patient was asked to elaborate, patient stated that the incident that brought her here occurred Christmas and that her son died. Patient repeatedly stated that no one was trying to help her and that she needed help. Patient was constantly reassured that the staff at [facility] was here to help her out to which the patient replied "don't give me that shit."  Patient endorses suicide ideations with no intent or active plan. Patient denies homicide ideations. She further endorses auditory and visual hallucinations, however, she was not able to accurately report if her hallucinations were auditory or visual in nature. Patient stated that the hallucinations were in her head or whenever she closes her eyes. Patient denies appetite. She reports poor sleep and states that she has been receiving on average 2 - 3 hours of sleep a day. She states that every time she goes to sleep she has terrible nightmares. Patient denies alcohol use but previously stated that she had been self medicating with alcohol. Patient denies tobacco use and illicit drug use. After the encounter, patient had to continuously be monitored by the staff because she would knock over the fixtures in  the room.   PHQ 2-9:  [place] Visit from 03/18/2017 in [place]  Thoughts that you would be better off dead, or of hurting yourself in some way Not at all  PHQ-9 Total Score 18      [place] [facility] from 09/23/2020 in [facility] Admission (Discharged) from 06/06/2018 in [place] [facility] ED from 06/05/2018 in [place] CATEGORY Moderate Risk High Risk High Risk       Total Time spent with patient: 20 minutes  Musculoskeletal  Strength & Muscle Tone: within normal limits Gait & Station: normal Patient leans: N/A  Psychiatric Specialty Exam  Presentation General Appearance: Disheveled  Eye Contact:Fair  Speech:Garbled  Speech Volume:Increased  Handedness:Right   Mood and Affect  Mood:Anxious; Depressed; Dysphoric; Labile; Irritable; Angry  Affect:Depressed; Labile; Tearful; Congruent   Thought Process  Thought Processes:Disorganized  Descriptions of Associations:Circumstantial  Orientation:Partial  Thought Content:Rumination; Scattered  Hallucinations:Hallucinations: -- (Patient endorses hallucinations but was not able to specify the type. She reports seeing things in her head and in her dreams.)  Ideas of Reference:None  Suicidal Thoughts:Suicidal Thoughts: Yes, Passive SI Passive Intent and/or Plan: Without Intent; Without Plan  Homicidal Thoughts:Homicidal Thoughts: No   Sensorium  Memory:No data recorded Judgment:Poor  Insight:Lacking   Executive Functions  Concentration:Fair  Attention Span:Fair  [place] (Patient constantly engaged in inflammatory language littered with expletives.)   Psychomotor Activity  Psychomotor Activity:Psychomotor Activity: Restlessness   Assets  Assets:Desire for Improvement   Sleep  Sleep:Sleep: Poor   Physical  Exam Constitutional:      General: She is in acute distress.  HENT:     Head: Normocephalic and atraumatic.     Nose: Nose normal.  Eyes:     Extraocular Movements: Extraocular movements intact.     Pupils: Pupils are equal, round, and reactive to light.  Cardiovascular:     Rate and Rhythm: Tachycardia present.  Pulmonary:     Effort: Pulmonary effort is normal.     Breath sounds: Normal breath sounds.  Musculoskeletal:        General: Normal range of motion.     Cervical back: Normal range of motion and neck supple.  Skin:    General: Skin is warm and dry.  Neurological:     General: No focal deficit present.     Mental Status: She is alert and oriented to person, place, and time.  Psychiatric:        Attention and Perception: She is inattentive.        Mood and Affect: Mood is anxious and depressed. Affect is labile, angry and tearful.        Speech: Speech is tangential.        Behavior: Behavior is uncooperative, agitated and aggressive.        Thought Content: Thought content includes suicidal ideation. Thought content does not include homicidal ideation. Thought content does not include suicidal plan.        Cognition and Memory: Cognition and memory normal.        Judgment: Judgment is impulsive.    Review of Systems  Constitutional: Negative.   HENT: Negative.   Eyes: Negative.   Respiratory: Negative.   Cardiovascular: Negative.   Gastrointestinal: Negative.   Musculoskeletal: Negative.   Skin: Negative.   Neurological: Negative.   Endo/Heme/Allergies: Negative.   Psychiatric/Behavioral: Positive for depression, hallucinations and suicidal ideas. Negative for substance abuse. The patient is nervous/anxious and has insomnia.     Blood pressure (!) 145/82, pulse 96, temperature (!) 97.3 F (36.3 C), temperature source Oral, resp. rate (!) 22, last menstrual period 08/02/2013, SpO2 95 %. There is no height or weight on file to calculate BMI.  Past Psychiatric  History: Bipolar Disorder    Is the patient at risk to self? Yes  Has the patient been a risk to self in the past 6 months? No .    Has the patient been a risk to self within the distant past? Yes   Is the patient a risk to others? No   Has the patient been a risk to others in the past 6 months? No   Has the patient been a risk to others within the distant past? No   Past Medical History:  Past Medical History:  Diagnosis Date  . Aneurysm of splenic artery (HCC)   . Anxiety   . Asthma   . Bipolar disorder ([place])   . Depression   . Diabetes mellitus without complication ([place])   . Diabetic peripheral neuropathy ([employer])   . Elevated LFTs   . ETOH abuse   . GERD (gastroesophageal reflux disease)   . Hepatitis C   . Heroin abuse ([place])   . History of MRSA infection    legs and spread to face  . Hypertension   . Insomnia   . Mixed hyperlipidemia   . OSA (obstructive sleep apnea)   . Sleep apnea     Past Surgical History:  Procedure Laterality Date  . BACK SURGERY    .  RADIAL HEAD ARTHROPLASTY  08/09/2012   Procedure: RADIAL HEAD ARTHROPLASTY;  Surgeon: Schuyler Amor, MD;  Location: [place];  Service: Orthopedics;  Laterality: Left;  Left Radial head Replacement  . WISDOM TOOTH EXTRACTION      Family History:  Family History  Problem Relation Age of Onset  . Depression Mother   . Osteoporosis Mother   . COPD Mother   . Rheum arthritis Mother   . Diabetes Father   . Hypertension Father   . Congestive Heart Failure Father   . Aneurysm Father   . Clotting disorder Father   . Heart disease Father   . Cancer Maternal Grandfather   . Colon cancer Neg Hx   . Esophageal cancer Neg Hx   . Rectal cancer Neg Hx   . Stomach cancer Neg Hx     Social History:  Social History   Socioeconomic History  . Marital status: Married    Spouse name: Not on file  . Number of children: 4  . Years of education: Not on file  . Highest education level: Not on file  Occupational  History  . Not on file  Tobacco Use  . Smoking status: Never Smoker  . Smokeless tobacco: Never Used  Vaping Use  . Vaping Use: Never used  Substance and Sexual Activity  . Alcohol use: Not Currently  . Drug use: Not Currently    Types: Heroin    Comment: no drug use for 3 years  . Sexual activity: Yes    Birth control/protection: None  Other Topics Concern  . Not on file  Social History Narrative  . Not on file   Social Determinants of Health   Financial Resource Strain: Not on file  Food Insecurity: Not on file  Transportation Needs: Not on file  Physical Activity: Not on file  Stress: Not on file  Social Connections: Not on file  Intimate Partner Violence: Not on file    SDOH:  SDOH Screenings   Alcohol Screen: Not on file  Depression [id number]): Not on file  Financial Resource Strain: Not on file  Food Insecurity: Not on file  Housing: Not on file  Physical Activity: Not on file  Social Connections: Not on file  Stress: Not on file  Tobacco Use: Low Risk   . Smoking Tobacco Use: Never Smoker  . Smokeless Tobacco Use: Never Used  Transportation Needs: Not on file    Last Labs:  Admission on 09/23/2020  Component Date Value Ref Range Status  . SARS Coronavirus 2 by RT PCR 09/23/2020 NEGATIVE  NEGATIVE Final   Comment: (NOTE) SARS-CoV-2 target nucleic acids are NOT DETECTED.  The SARS-CoV-2 RNA is generally detectable in upper respiratory specimens during the acute phase of infection. The lowest concentration of SARS-CoV-2 viral copies this assay can detect is 138 copies/mL. A negative result does not preclude SARS-Cov-2 infection and should not be used as the sole basis for treatment or other patient management decisions. A negative result may occur with  improper specimen collection/handling, submission of specimen other than nasopharyngeal swab, presence of viral mutation(s) within the areas targeted by this assay, and inadequate number of  viral copies(<138 copies/mL). A negative result must be combined with clinical observations, patient history, and epidemiological information. The expected result is Negative.  Fact Sheet for Patients:  EntrepreneurPulse.com.au  Fact Sheet for Healthcare Providers:  IncredibleEmployment.be  This test is no  t yet approved or cleared by the [place] and  has been authorized for detection and/or diagnosis of SARS-CoV-2 by FDA under an Emergency Use Authorization (EUA). This EUA will remain  in effect (meaning this test can be used) for the duration of the COVID-19 declaration under Section 564(b)(1) of the Act, 21 U.S.C.section 360bbb-3(b)(1), unless the authorization is terminated  or revoked sooner.      . Influenza A by PCR 09/23/2020 NEGATIVE  NEGATIVE Final  . Influenza B by PCR 09/23/2020 NEGATIVE  NEGATIVE Final   Comment: (NOTE) The Xpert Xpress SARS-CoV-2/FLU/RSV plus assay is intended as an aid in the diagnosis of influenza from Nasopharyngeal swab specimens and should not be used as a sole basis for treatment. Nasal washings and aspirates are unacceptable for Xpert Xpress SARS-CoV-2/FLU/RSV testing.  Fact Sheet for Patients: BloggerCourse.com  Fact Sheet for Healthcare Providers: SeriousBroker.it  This test is not yet approved or cleared by the [place] FDA and has been authorized for detection and/or diagnosis of SARS-CoV-2 by FDA under an Emergency Use Authorization (EUA). This EUA will remain in effect (meaning this test can be used) for the duration of the COVID-19 declaration under Section 564(b)(1) of the Act, 21 U.S.C. section 360bbb-3(b)(1), unless the authorization is terminated or revoked.  Performed at [facility], [address] [place], [place] [address]   . POC Amphetamine UR 09/24/2020 None Detected  NONE DETECTED  (Cut Off Level 1000 ng/mL) Final  . POC Secobarbital (BAR) 09/24/2020 None Detected  NONE DETECTED (Cut Off Level 300 ng/mL) Final  . POC Buprenorphine (BUP) 09/24/2020 None Detected  NONE DETECTED (Cut Off Level 10 ng/mL) Final  . POC Oxazepam (BZO) 09/24/2020 None Detected  NONE DETECTED (Cut Off Level 300 ng/mL) Final  . POC Cocaine UR 09/24/2020 None Detected  NONE DETECTED (Cut Off Level 300 ng/mL) Final  . POC Methamphetamine UR 09/24/2020 None Detected  NONE DETECTED (Cut Off Level 1000 ng/mL) Final  . POC Morphine 09/24/2020 None Detected  NONE DETECTED (Cut Off Level 300 ng/mL) Final  . POC Oxycodone UR 09/24/2020 None Detected  NONE DETECTED (Cut Off Level 100 ng/mL) Final  . POC Methadone UR 09/24/2020 None Detected  NONE DETECTED (Cut Off Level 300 ng/mL) Final  . POC Marijuana UR 09/24/2020 None Detected  NONE DETECTED (Cut Off Level 50 ng/mL) Final  . Hgb A1c MFr Bld 09/23/2020 5.5  4.8 - 5.6 % Final   Comment: (NOTE) Pre diabetes:          5.7%-6.4%  Diabetes:              >6.4%  Glycemic control for   <7.0% adults with diabetes   . Mean Plasma Glucose 09/23/2020 111.15  mg/dL Final   Performed at [facility], [address] [place], [place] [address]  . WBC 09/23/2020 6.1  4.0 - 10.5 K/uL Final  . RBC 09/23/2020 4.86  3.87 - 5.11 MIL/uL Final  . Hemoglobin 09/23/2020 15.4* 12.0 - 15.0 g/dL Final  . HCT 02/72/5366 44.2  36.0 - 46.0 % Final  . MCV 09/23/2020 90.9  80.0 - 100.0 fL Final  . MCH 09/23/2020 31.7  26.0 - 34.0 pg Final  . MCHC 09/23/2020 34.8  30.0 - 36.0 g/dL Final  . RDW 44/11/4740 12.8  11.5 - 15.5 % Final  . Platelets 09/23/2020 232  150 - 400 K/uL Final  . nRBC 09/23/2020 0.0  0.0 - 0.2 % Final  . Neutrophils Relative %  09/23/2020 55  % Final  . Neutro Abs 09/23/2020 3.3  1.7 - 7.7 K/uL Final  . Lymphocytes Relative 09/23/2020 36  % Final  . Lymphs Abs 09/23/2020 2.2  0.7 - 4.0 K/uL Final  . Monocytes Relative 09/23/2020 6  % Final  .  Monocytes Absolute 09/23/2020 0.4  0.1 - 1.0 K/uL Final  . Eosinophils Relative 09/23/2020 2  % Final  . Eosinophils Absolute 09/23/2020 0.1  0.0 - 0.5 K/uL Final  . Basophils Relative 09/23/2020 1  % Final  . Basophils Absolute 09/23/2020 0.1  0.0 - 0.1 K/uL Final  . Immature Granulocytes 09/23/2020 0  % Final  . Abs Immature Granulocytes 09/23/2020 0.02  0.00 - 0.07 K/uL Final   Performed at [facility] [address], [place], [place] [address]  . Alcohol, Ethyl (B) 09/23/2020 245* <10 mg/dL Final   Comment: (NOTE) Lowest detectable limit for serum alcohol is 10 mg/dL.  For medical purposes only. Performed at [facility], [place] [address], [place], [place] [address]   . Cholesterol 09/23/2020 207* 0 - 200 mg/dL Final  . Triglycerides 09/23/2020 477* <150 mg/dL Final  . HDL 09/23/2020 49  >40 mg/dL Final  . Total CHOL/HDL Ratio 09/23/2020 4.2  RATIO Final  . VLDL 09/23/2020 UNABLE TO CALCULATE IF TRIGLYCERIDE OVER 400 mg/dL  0 - 40 mg/dL Final  . LDL Cholesterol 09/23/2020 UNABLE TO CALCULATE IF TRIGLYCERIDE OVER 400 mg/dL  0 - 99 mg/dL Final   Comment:        Total Cholesterol/HDL:CHD Risk Coronary Heart Disease Risk Table                     Men   Women  1/2 Average Risk   3.4   3.3  Average Risk       5.0   4.4  2 X Average Risk   9.6   7.1  3 X Average Risk  23.4   11.0        Use the calculated Patient Ratio above and the CHD Risk Table to determine the patient's CHD Risk.        ATP III CLASSIFICATION (LDL):  <100     mg/dL   Optimal  100-129  mg/dL   Near or Above                    Optimal  130-159  mg/dL   Borderline  160-189  mg/dL   High  >190     mg/dL   Very High Performed at [facility] [address], [place], [place] [address]   . TSH 09/23/2020 3.115  0.350 - 4.500 uIU/mL Final   Comment: Performed by a 3rd Generation assay with a functional sensitivity of <=0.01 uIU/mL. Performed at [facility], [place] [address]  [place], [place] [address]   . SARS Coronavirus 2 Ag 09/23/2020 Negative  Negative Preliminary  . SARS Coronavirus 2 Ag 09/23/2020 NEGATIVE  NEGATIVE Final   Comment: (NOTE) SARS-CoV-2 antigen NOT DETECTED.   Negative results are presumptive.  Negative results do not preclude SARS-CoV-2 infection and should not be used as the sole basis for treatment or other patient management decisions, including infection  control decisions, particularly in the presence of clinical signs and  symptoms consistent with COVID-19, or in those who have been in contact with the virus.  Negative results must be combined with clinical observations, patient history, and epidemiological information. The  expected result is Negative.  Fact Sheet for Patients: PodPark.tn  Fact Sheet for Healthcare Providers: GiftContent.is   This test is not yet approved or cleared by the [place] FDA and  has been authorized for detection and/or diagnosis of SARS-CoV-2 by FDA under an Emergency Use Authorization (EUA).  This EUA will remain in effect (meaning this test can be used) for the duration of  the C                          OVID-19 declaration under Section 564(b)(1) of the Act, 21 U.S.C. section 360bbb-3(b)(1), unless the authorization is terminated or revoked sooner.    . Sodium 09/23/2020 134* 135 - 145 mmol/L Final  . Potassium 09/23/2020 4.1  3.5 - 5.1 mmol/L Final  . Chloride 09/23/2020 96* 98 - 111 mmol/L Final  . CO2 09/23/2020 21* 22 - 32 mmol/L Final  . Glucose, Bld 09/23/2020 111* 70 - 99 mg/dL Final   Glucose reference range applies only to samples taken after fasting for at least 8 hours.  . BUN 09/23/2020 16  6 - 20 mg/dL Final  . Creatinine, Ser 09/23/2020 0.82  0.44 - 1.00 mg/dL Final  . Calcium 09/23/2020 10.1  8.9 - 10.3 mg/dL Final  . Total Protein 09/23/2020 7.2  6.5 - 8.1 g/dL Final  . Albumin 09/23/2020 4.5  3.5 - 5.0 g/dL Final   . AST 09/23/2020 184* 15 - 41 U/L Final  . ALT 09/23/2020 99* 0 - 44 U/L Final  . Alkaline Phosphatase 09/23/2020 99  38 - 126 U/L Final  . Total Bilirubin 09/23/2020 1.5* 0.3 - 1.2 mg/dL Final  . GFR, Estimated 09/23/2020 >60  >60 mL/min Final   Comment: (NOTE) Calculated using the CKD-EPI Creatinine Equation (2021)   . Anion gap 09/23/2020 17* 5 - 15 Final   Performed at [facility] [address] [place], [place] [address]  . Direct LDL 09/23/2020 95.3  0 - 99 mg/dL Final   Performed at [facility], [place] [address], [place], [place] [address]  . Preg Test, Ur 09/24/2020 NEGATIVE  NEGATIVE Final   Comment:        THE SENSITIVITY OF THIS METHODOLOGY IS >24 mIU/mL   Office Visit on 06/25/2020  Component Date Value Ref Range Status  . Neisseria Gonorrhea 06/25/2020 Negative   Final  . Chlamydia 06/25/2020 Negative   Final  . Trichomonas 06/25/2020 Negative   Final  . Bacterial Vaginitis (gardnerella) 06/25/2020 Positive*  Final  . Candida Vaginitis 06/25/2020 Negative   Final  . Candida Glabrata 06/25/2020 Negative   Final  . Comment 06/25/2020 Normal Reference Range Bacterial Vaginosis - Negative   Final  . Comment 06/25/2020 Normal Reference Range Candida Species - Negative   Final  . Comment 06/25/2020 Normal Reference Range Candida Galbrata - Negative   Final  . Comment 06/25/2020 Normal Reference Range Trichomonas - Negative   Final  . Comment 06/25/2020 Normal Reference [place] Chlamydia - Negative   Final  . Comment 06/25/2020 Normal Reference Range Neisseria Gonorrhea - Negative   Final  Orders Only on 03/29/2020  Component Date Value Ref Range Status  . SARS Coronavirus 2 03/29/2020 RESULT: NEGATIVE   Final   Comment: RESULT: NEGATIVESARS-CoV-2 INTERPRETATION:A NEGATIVE  test result means that SARS-CoV-2 RNA was not present in the specimen above the limit of detection of this test. This does not preclude a possible SARS-CoV-2 infection and should not be  used  as the  sole basis for patient management decisions. Negative results must be combined with clinical observations, patient history, and epidemiological information. Optimum specimen types and timing for peak viral levels during infections caused by SARS-CoV-2  have not been determined. Collection of multiple specimens or types of specimens may be necessary to detect virus. Improper specimen collection and handling, sequence variability under primers/probes, or organism present below the limit of detection may  lead to false negative results. Positive and negative predictive values of testing are highly dependent on prevalence. False negative test results are more likely when prevalence of disease is high.The expected result is NEGATIVE.Fact S                          heet for  Healthcare Providers: LocalChronicle.no Sheet for Patients: SalonLookup.es Reference Range - Negative     Allergies: Sulfa antibiotics and Aspirin  PTA Medications: (Not in a hospital admission)   Medical Decision Making  Based on my evaluation, patient meets criteria for inpatient psychiatric treatment, however, there is no bed placement at this time. Due to lack of bed availability. Patient is to be admitted to [facility] for continuous observation and will be assessed in the morning by psychiatric provider. Admission labs have been ordered and initiated. Patient was given 50 mg of Vistaril and 1 mg of Parzosin on admission for the management of anxiety and nightmares, respectively.    Recommendations  Based on my evaluation the patient does not appear to have an emergency medical condition. Patient to be admitted to [facility] for continuous observation and reassessed morning. Based on my evaluation, patient meets  criteria for psychiatric inpatient admission supported by patient endorsing suicide ideations.  Malachy Mood, PA 09/24/20  4:20 AM

## 2020-09-24 NOTE — ED Notes (Signed)
Pt sitting up in no acute distress. Denies concerns at present. Safety maintained.

## 2020-09-24 NOTE — Discharge Instructions (Addendum)

## 2020-09-24 NOTE — ED Notes (Signed)
Given hot chocolate

## 2020-09-24 NOTE — ED Notes (Signed)
Pt sleeping@this time. Breathing even and unlabored. Will continue to monitor for safety 

## 2020-09-24 NOTE — ED Notes (Signed)
Given  breakfast

## 2020-09-24 NOTE — ED Notes (Signed)
Patient property in locker 25

## 2020-09-24 NOTE — ED Provider Notes (Signed)
[facility]BC/OBS ASAP Discharge Summary  Date and Time: 09/24/2020 11:18 AM  Name: Kristina Huffman  MRN:  [id number]   Discharge Diagnoses:  Final diagnoses:  Bipolar affective disorder, current episode mixed, current episode severity unspecified (HCC)  Suicide ideation    Subjective: Patient reports today that she is feeling better today.  She denies any suicidal homicidal ideations and denies any hallucinations.  Patient reports that she sees Dr. Maggie Schwalbe for outpatient psychiatry.  She states that he changed her medications around because he felt that it was not the appropriate dosing.  She states she used to take Seroquel 100 mg 4 times a day and he switched that to where she only took it at bedtime.  She states that since then she has been decompensating and he has refused to change it back.  She states that when she feels her medications are working she turns to drinking alcohol.  She reports that her nightmares have been horrible due to her son dying in 2018-01-08 from fentanyl overdose as well as trauma of her own from the past.  She reports that she has been drinking 1 L of wine daily for the last several months.  She states that she is tried to stop on her own but is not done very well because she feels horrible.  She reports that she was actually able to sleep well last night with the prazosin because she had no nightmares and would like to be able to continue it and is asking for her medications to be reassessed.  After discussing with her medications as well as treatment patient has agreed to go to detox for her alcohol use and I have agreed to add prazosin 1 mg nightly and change her Seroquel to 100 mg p.o. twice daily and 200 mg nightly and to continue her other current home medications.  Stay Summary: Patient is a 60 year old female with a known history of bipolar disorder that presented to the [facility] reporting suicidal ideation and worsening depressive symptoms.  Patient was intoxicated when she arrived and  her BAL was 245.  Patient was belligerent, yelling, cursing, and uncooperative last night during assessment.  Patient was admitted to the continuous observation unit due to alcohol intoxication and also endorsing suicidal ideations.  This morning the patient reports that she is feeling better.  She states that she has that with depression since her son died in 2018/01/08 and that her medications have been changed by her current outpatient provider and she is decompensated.  After reviewing medications as well as treatment options, the patient has agreed [facility] [place] [facility] and I have medication adjustments made that included continuing Prozac 40 mg p.o. daily, Neurontin 600 mg p.o. 3 times daily, HCTZ 25 mg p.o. daily, and adjusting Seroquel 100 mg p.o. twice daily and 200 mg p.o. nightly, and adding on prazosin 1 mg p.o. nightly.  Patient is provided with 7-day samples and 30-day prescriptions of her medications and will be provided [facility] [place] via safe transport.  Patient has continued to deny any suicidal or homicidal ideations and denies any hallucinations.  Total Time spent with patient: 30 minutes  Past Psychiatric History: Depression, alcohol abuse, bipolar disorder Past Medical History:  Past Medical History:  Diagnosis Date  . Aneurysm of splenic artery (HCC)   . Anxiety   . Asthma   . Bipolar disorder (HCC)   . Depression   . Diabetes mellitus without complication (HCC)   .  Diabetic peripheral neuropathy ([place])   . Elevated LFTs   . ETOH abuse   . GERD (gastroesophageal reflux disease)   . Hepatitis C   . Heroin abuse ([place])   . History of MRSA infection    legs and spread to face  . Hypertension   . Insomnia   . Mixed hyperlipidemia   . OSA (obstructive sleep apnea)   . Sleep apnea     Past Surgical History:  Procedure Laterality Date  . BACK SURGERY    . RADIAL HEAD ARTHROPLASTY  08/09/2012   Procedure: RADIAL HEAD ARTHROPLASTY;   Surgeon: Schuyler Amor, MD;  Location: [facility];  Service: Orthopedics;  Laterality: Left;  Left Radial head Replacement  . WISDOM TOOTH EXTRACTION     Family History:  Family History  Problem Relation Age of Onset  . Depression Mother   . Osteoporosis Mother   . COPD Mother   . Rheum arthritis Mother   . Diabetes Father   . Hypertension Father   . Congestive Heart Failure Father   . Aneurysm Father   . Clotting disorder Father   . Heart disease Father   . Cancer Maternal Grandfather   . Colon cancer Neg Hx   . Esophageal cancer Neg Hx   . Rectal cancer Neg Hx   . Stomach cancer Neg Hx    Family Psychiatric History: Son overdosed on fentanyl Social History:  Social History   Substance and Sexual Activity  Alcohol Use Not Currently     Social History   Substance and Sexual Activity  Drug Use Not Currently  . Types: Heroin   Comment: no drug use for 3 years    Social History   Socioeconomic History  . Marital status: Married    Spouse name: Not on file  . Number of children: 4  . Years of education: Not on file  . Highest education level: Not on file  Occupational History  . Not on file  Tobacco Use  . Smoking status: Never Smoker  . Smokeless tobacco: Never Used  Vaping Use  . Vaping Use: Never used  Substance and Sexual Activity  . Alcohol use: Not Currently  . Drug use: Not Currently    Types: Heroin    Comment: no drug use for 3 years  . Sexual activity: Yes    Birth control/protection: None  Other Topics Concern  . Not on file  Social History Narrative  . Not on file   Social Determinants of Health   Financial Resource Strain: Not on file  Food Insecurity: Not on file  Transportation Needs: Not on file  Physical Activity: Not on file  Stress: Not on file  Social Connections: Not on file   SDOH:  SDOH Screenings   Alcohol Screen: Not on file  Depression [id number]): Not on file  Financial Resource Strain: Not on file  Food Insecurity: Not  on file  Housing: Not on file  Physical Activity: Not on file  Social Connections: Not on file  Stress: Not on file  Tobacco Use: Low Risk   . Smoking Tobacco Use: Never Smoker  . Smokeless Tobacco Use: Never Used  Transportation Needs: Not on file    Has this patient used any form of tobacco in the last 30 days? (Cigarettes, Smokeless Tobacco, Cigars, and/or Pipes) A prescription for an FDA-approved tobacco cessation medication was offered at discharge and the patient refused  Current Medications:  Current Facility-Administered Medications  Medication Dose Route Frequency Provider Last  Rate Last Admin  . acetaminophen (TYLENOL) tablet 650 mg  650 mg Oral Q6H PRN Nwoko, Uchenna E, PA   650 mg at 09/24/20 0924  . alum & mag hydroxide-simeth (MAALOX/MYLANTA) 200-200-20 MG/5ML suspension 30 mL  30 mL Oral Q4H PRN Nwoko, Uchenna E, PA      . FLUoxetine (PROZAC) capsule 40 mg  40 mg Oral Daily Davieon Stockham B, FNP      . gabapentin (NEURONTIN) tablet 600 mg  600 mg Oral TID Mckaylee Dimalanta, Lowry Ram, FNP      . hydrochlorothiazide (HYDRODIURIL) tablet 25 mg  25 mg Oral Daily Efrata Brunner, Lowry Ram, [place]      . hydrOXYzine (ATARAX/VISTARIL) tablet 25 mg  25 mg Oral Q6H PRN Yoshiaki Kreuser, Lowry Ram, FNP      . [START ON 09/27/2020] hydrOXYzine (ATARAX/VISTARIL) tablet 25 mg  25 mg Oral TID PRN Derian Dimalanta, Lowry Ram, FNP      . loperamide (IMODIUM) capsule 2-4 mg  2-4 mg Oral PRN Keeana Pieratt, Lowry Ram, FNP      . LORazepam (ATIVAN) tablet 1 mg  1 mg Oral Q6H PRN Romar Woodrick, Lowry Ram, FNP      . magnesium hydroxide (MILK OF MAGNESIA) suspension 30 mL  30 mL Oral Daily PRN Nwoko, Uchenna E, PA      . multivitamin with minerals tablet 1 tablet  1 tablet Oral Daily Lonzell Dorris B, FNP      . ondansetron (ZOFRAN-ODT) disintegrating tablet 4 mg  4 mg Oral Q6H PRN Shareeka Yim, Darnelle Maffucci B, FNP      . prazosin (MINIPRESS) capsule 1 mg  1 mg Oral QHS Calianna Kim, Lowry Ram, FNP      . QUEtiapine (SEROQUEL) tablet 100 mg  100 mg Oral BID Camron Essman, Lowry Ram, FNP       . QUEtiapine (SEROQUEL) tablet 200 mg  200 mg Oral QHS Ahnya Akre B, FNP      . thiamine (B-1) injection 100 mg  100 mg Intramuscular Once Cana Mignano, Lowry Ram, FNP      . [START ON 09/25/2020] thiamine tablet 100 mg  100 mg Oral Daily Iriana Artley, Lowry Ram, FNP      . traZODone (DESYREL) tablet 50 mg  50 mg Oral QHS PRN Nwoko, Uchenna E, PA   50 mg at 09/23/20 2211   Current Outpatient Medications  Medication Sig Dispense Refill  . FLUoxetine (PROZAC) 40 MG capsule Take 1 capsule (40 mg total) by mouth daily. 30 capsule 0  . gabapentin (NEURONTIN) 600 MG tablet Take 1 tablet (600 mg total) by mouth 3 (three) times daily. 90 tablet 0  . hydrochlorothiazide (HYDRODIURIL) 25 MG tablet Take 1 tablet (25 mg total) by mouth daily. 30 tablet 0  . prazosin (MINIPRESS) 1 MG capsule Take 1 capsule (1 mg total) by mouth at bedtime. 30 capsule 0  . QUEtiapine (SEROQUEL) 100 MG tablet Take 1 tablet (100 mg total) by mouth 2 (two) times daily. 60 tablet 0  . QUEtiapine (SEROQUEL) 200 MG tablet Take 1 tablet (200 mg total) by mouth at bedtime. 30 tablet 0  . traZODone (DESYREL) 50 MG tablet Take 1 tablet (50 mg total) by mouth at bedtime as needed for sleep. 30 tablet 0    PTA Medications: (Not in a hospital admission)   Musculoskeletal  Strength & Muscle Tone: within normal limits Gait & Station: normal Patient leans: N/A  Psychiatric Specialty Exam  Presentation  General Appearance: Appropriate for Environment; Disheveled; Casual  Eye Contact:Good  Speech:Clear and Coherent; Normal Rate  Speech Volume:Normal  Handedness:Right   Mood and Affect  Mood:Depressed  Affect:Congruent; Appropriate   Thought Process  Thought Processes:Coherent  Descriptions of Associations:Intact  Orientation:Full (Time, Place and Person)  Thought Content:WDL  Hallucinations:Hallucinations: None  Ideas of Reference:None  Suicidal Thoughts:Suicidal Thoughts: No SI Passive Intent and/or Plan: Without  Intent; Without Plan  Homicidal Thoughts:Homicidal Thoughts: No   Sensorium  Memory:Immediate Good; Recent Good; Remote Good  Judgment:Good  Insight:Fair   Executive Functions  Concentration:Good  Attention Span:Good  [place] of Knowledge:Good  Language:Good   Psychomotor Activity  Psychomotor Activity:Psychomotor Activity: Normal   Assets  Assets:Communication Skills; Desire for Improvement; Financial Resources/Insurance; Housing; Social Support; Physical Health   Sleep  Sleep:Sleep: Fair   Physical Exam  Physical Exam Vitals and nursing note reviewed.  Constitutional:      Appearance: She is well-developed.  HENT:     Head: Normocephalic.  Eyes:     Pupils: Pupils are equal, round, and reactive to light.  Cardiovascular:     Rate and Rhythm: Normal rate.  Pulmonary:     Effort: Pulmonary effort is normal.  Musculoskeletal:        General: Normal range of motion.  Neurological:     Mental Status: She is alert and oriented to person, place, and time.    Review of Systems  Constitutional: Negative.   HENT: Negative.   Eyes: Negative.   Respiratory: Negative.   Cardiovascular: Negative.   Gastrointestinal: Negative.   Genitourinary: Negative.   Musculoskeletal: Negative.   Skin: Negative.   Neurological: Negative.   Endo/Heme/Allergies: Negative.   Psychiatric/Behavioral: Positive for depression and substance abuse.   Blood pressure 107/78, pulse 88, temperature 98.3 F (36.8 C), temperature source Oral, resp. rate 16, last menstrual period 08/02/2013, SpO2 92 %. There is no height or weight on file to calculate BMI.  Demographic Factors:  Caucasian and Living alone  Loss Factors: NA  Historical Factors: Family history of mental illness or substance abuse  Risk Reduction Factors:   Positive social support and Positive therapeutic relationship  Continued Clinical Symptoms:  Alcohol/Substance Abuse/Dependencies Previous  Psychiatric Diagnoses and Treatments  Cognitive Features That Contribute To Risk:  None    Suicide Risk:  Mild:  Suicidal ideation of limited frequency, intensity, duration, and specificity.  There are no identifiable plans, no associated intent, mild dysphoria and related symptoms, good self-control (both objective and subjective assessment), few other risk factors, and identifiable protective factors, including available and accessible social support.  Plan Of Care/Follow-up recommendations:  Continue activity as tolerated. Continue diet as recommended by your PCP. Ensure to keep all appointments with outpatient providers.  Disposition: [facility] [place] [place] 09/24/2020, 11:18 AM

## 2020-09-24 NOTE — ED Notes (Signed)
Talking with provider

## 2020-09-24 NOTE — ED Notes (Signed)
Pt sitting up on side of bed. Received am medication without difficulty. Denies concerns at present time. Denies withdrawal sx from ETOH. Denies SI/HI/AVH. Will continue to monitor for safety.

## 2020-09-24 NOTE — ED Notes (Signed)
Pt A&0 x3. Calm, cooperative with staff. Pt states, "I am not feeling suicidal, just overwhelmed. I lied and said I was using drugs just so someone could hear me out but I only drink. I don't need help with that. I keep having these terrible nightmares that keep me from sleeping at night. I'm exhausted and my doctor don't seem to care". Pt complain of night terrors of events that happened in her past and requesting inpatient stay to "clear my head". Denies SI/HI/AVH. Will monitor for safety.

## 2020-10-04 ENCOUNTER — Ambulatory Visit (HOSPITAL_COMMUNITY): Payer: Medicaid Other | Admitting: Behavioral Health

## 2020-10-11 ENCOUNTER — Other Ambulatory Visit: Payer: Self-pay

## 2020-10-11 ENCOUNTER — Telehealth (HOSPITAL_COMMUNITY): Payer: Medicaid Other | Admitting: Behavioral Health

## 2020-10-11 ENCOUNTER — Ambulatory Visit (HOSPITAL_COMMUNITY): Payer: Medicaid Other | Admitting: Behavioral Health

## 2020-10-14 ENCOUNTER — Other Ambulatory Visit: Payer: Self-pay

## 2020-10-14 ENCOUNTER — Encounter (HOSPITAL_COMMUNITY): Payer: Self-pay

## 2020-10-14 ENCOUNTER — Ambulatory Visit (HOSPITAL_COMMUNITY)
Admission: EM | Admit: 2020-10-14 | Discharge: 2020-10-14 | Disposition: A | Discharge status: Home / Self Care | Payer: Medicaid Other | Attending: Student | Admitting: Student

## 2020-10-14 ENCOUNTER — Telehealth (HOSPITAL_COMMUNITY): Payer: Self-pay

## 2020-10-14 DIAGNOSIS — B9689 Other specified bacterial agents as the cause of diseases classified elsewhere: Secondary | ICD-10-CM

## 2020-10-14 DIAGNOSIS — N760 Acute vaginitis: Secondary | ICD-10-CM | POA: Insufficient documentation

## 2020-10-14 MED ORDER — CLINDAMYCIN PHOSPHATE 100 MG VA SUPP: 100.0000 mg | Freq: Every day | VAGINAL | 0 refills | Status: DC

## 2020-10-14 NOTE — ED Provider Notes (Addendum)
[facility]    CSN: [id number] Arrival date & time: 10/14/20  1724      History   Chief Complaint Chief Complaint  Patient presents with  . Vaginal Pain & Dryness    HPI Kristina Huffman is a 60 y.o. female presenting with 3 months of intermittent BV symptoms, currently exacerbated for 3 days. History anxiety, bipolar disorder, depression, diabetes mellitus, hypertension, obesity, GERD, hep c. Presenting today with 3 days of exacerbation of chronic BV symptoms. Endorses sensation of "dry" vagina, occ thin white discharge, external vaginal irritation. Denies itching, lesions. occ crampy lower abd pain but none currently. Denies fevers/chills. Adamantly denies STI risk or new partners. States she's been followed by gyn for these symptoms in the past, but did not want to wait until tomorrow to start treatment. Denies hematuria, dysuria, frequency, urgency, back pain, n/v/d/abd pain, fevers/chills. She is postmenopausal.  Also endorses pain behind L shoulderblade since this morning. Denies injury. Pain with deep breathing and moving L arm.    HPI  Past Medical History:  Diagnosis Date  . Aneurysm of splenic artery (HCC)   . Anxiety   . Asthma   . Bipolar disorder ([place])   . Depression   . Diabetes mellitus without complication ([place])   . Diabetic peripheral neuropathy ([place])   . Elevated LFTs   . ETOH abuse   . GERD (gastroesophageal reflux disease)   . Hepatitis C   . Heroin abuse ([place])   . History of MRSA infection    legs and spread to face  . Hypertension   . Insomnia   . Mixed hyperlipidemia   . OSA (obstructive sleep apnea)   . Sleep apnea     Patient Active Problem List   Diagnosis Date Noted  . Acute respiratory failure due to COVID-19 ([place]) 10/14/2019  . ARF (acute renal failure) ([place]) 10/14/2019  . Essential hypertension 10/14/2019  . Controlled type 2 diabetes mellitus with hyperglycemia ([place]) 10/14/2019  . Alcohol use disorder, severe, dependence  ([place]) 06/06/2018  . Chronic hepatitis C without hepatic coma ([place]) 06/04/2016  . Substance induced mood disorder ([place]) 09/20/2013  . Benzodiazepine dependence ([place]) 09/21/2011  . Bipolar 1 disorder, mixed, moderate ([place]) 09/21/2011  . PTSD (post-traumatic stress disorder) 09/21/2011    Past Surgical History:  Procedure Laterality Date  . BACK SURGERY    . RADIAL HEAD ARTHROPLASTY  08/09/2012   Procedure: RADIAL HEAD ARTHROPLASTY;  Surgeon: Schuyler Amor, MD;  Location: [facility];  Service: Orthopedics;  Laterality: Left;  Left Radial head Replacement  . WISDOM TOOTH EXTRACTION      OB History    Gravida  8   Para  4   Term  4   Preterm      AB  4   Living  4     SAB  1   IAB  3   Ectopic      Multiple      Live Births  4            Home Medications    Prior to Admission medications   Medication Sig Start Date End Date Taking? Authorizing Provider  clindamycin (CLEOCIN) 100 MG vaginal suppository Place 1 suppository (100 mg total) vaginally at bedtime. 10/14/20   Hazel Sams, [name]  FLUoxetine (PROZAC) 40 MG capsule Take 1 capsule (40 mg total) by mouth daily. 09/24/20   Money, Lowry Ram, FNP  gabapentin (NEURONTIN) 600 MG tablet Take 1 tablet (600 mg total) by  mouth 3 (three) times daily. 09/24/20   Money, Lowry Ram, FNP  hydrochlorothiazide (HYDRODIURIL) 25 MG tablet Take 1 tablet (25 mg total) by mouth daily. 09/24/20   Money, Lowry Ram, FNP  prazosin (MINIPRESS) 1 MG capsule Take 1 capsule (1 mg total) by mouth at bedtime. 09/24/20   Money, Lowry Ram, FNP  QUEtiapine (SEROQUEL) 100 MG tablet Take 1 tablet (100 mg total) by mouth 2 (two) times daily. 09/24/20   Money, Lowry Ram, FNP  QUEtiapine (SEROQUEL) 200 MG tablet Take 1 tablet (200 mg total) by mouth at bedtime. 09/24/20   Money, Lowry Ram, FNP  traZODone (DESYREL) 50 MG tablet Take 1 tablet (50 mg total) by mouth at bedtime as needed for sleep. 09/24/20   Money, Lowry Ram, FNP  metFORMIN (GLUCOPHAGE) 500 MG tablet  Take 1 tablet (500 mg total) by mouth 2 (two) times daily with a meal. For diabetes management 06/09/18 11/30/19  Encarnacion Slates, NP    Family History Family History  Problem Relation Age of Onset  . Depression Mother   . Osteoporosis Mother   . COPD Mother   . Rheum arthritis Mother   . Diabetes Father   . Hypertension Father   . Congestive Heart Failure Father   . Aneurysm Father   . Clotting disorder Father   . Heart disease Father   . Cancer Maternal Grandfather   . Colon cancer Neg Hx   . Esophageal cancer Neg Hx   . Rectal cancer Neg Hx   . Stomach cancer Neg Hx     Social History Social History   Tobacco Use  . Smoking status: Never Smoker  . Smokeless tobacco: Never Used  Vaping Use  . Vaping Use: Never used  Substance Use Topics  . Alcohol use: Not Currently  . Drug use: Not Currently    Types: Heroin    Comment: no drug use for 3 years     Allergies   Sulfa antibiotics and Aspirin   Review of Systems Review of Systems  Constitutional: Negative for appetite change, chills, diaphoresis and fever.  Respiratory: Negative for shortness of breath.   Cardiovascular: Negative for chest pain.  Gastrointestinal: Negative for abdominal pain, blood in stool, constipation, diarrhea, nausea and vomiting.  Genitourinary: Positive for vaginal discharge. Negative for decreased urine volume, difficulty urinating, dysuria, flank pain, frequency, genital sores, hematuria, menstrual problem, pelvic pain, urgency, vaginal bleeding and vaginal pain.  Musculoskeletal: Positive for back pain.  Neurological: Negative for dizziness, weakness and light-headedness.  All other systems reviewed and are negative.    Physical Exam Triage Vital Signs ED Triage Vitals  Enc Vitals Group     BP 10/14/20 1806 113/67     Pulse Rate 10/14/20 1806 87     Resp 10/14/20 1806 17     Temp 10/14/20 1806 98.4 F (36.9 C)     Temp Source 10/14/20 1806 Oral     SpO2 10/14/20 1806 92 %      Weight --      Height --      Head Circumference --      Peak Flow --      Pain Score 10/14/20 1805 5     Pain Loc --      Pain Edu? --      Excl. in [place]? --    No data found.  Updated Vital Signs BP 113/67 (BP Location: Right Arm)   Pulse 87   Temp 98.4 F (36.9 C) (Oral)  Resp 17   LMP 08/02/2013 Comment: irregular  SpO2 92%   Visual Acuity Right Eye Distance:   Left Eye Distance:   Bilateral Distance:    Right Eye Near:   Left Eye Near:    Bilateral Near:     Physical Exam Vitals reviewed.  Constitutional:      General: She is not in acute distress.    Appearance: Normal appearance. She is not ill-appearing.  HENT:     Head: Normocephalic and atraumatic.  Cardiovascular:     Rate and Rhythm: Normal rate and regular rhythm.     Heart sounds: Normal heart sounds.  Pulmonary:     Effort: Pulmonary effort is normal.     Breath sounds: Normal breath sounds.  Abdominal:     General: Bowel sounds are normal. There is no distension.     Palpations: Abdomen is soft. There is no mass.     Tenderness: There is no abdominal tenderness. There is no right CVA tenderness, left CVA tenderness, guarding or rebound.  Musculoskeletal:       Arms:     Thoracic back: Tenderness present.     Comments: Pain to deep palpation of L thoracic paraspinous muscles. No bony abnormality, ecchymosis. Pain elicited with abduction L arm. No CVAT.  Neurological:     General: No focal deficit present.     Mental Status: She is alert and oriented to person, place, and time. Mental status is at baseline.  Psychiatric:        Mood and Affect: Mood is anxious.        Behavior: Behavior normal.        Thought Content: Thought content normal.        Judgment: Judgment normal.      UC Treatments / Results  Labs (all labs ordered are listed, but only abnormal results are displayed) Labs Reviewed  CERVICOVAGINAL ANCILLARY ONLY    EKG   Radiology No results  found.  Procedures Procedures (including critical care time)  Medications Ordered in UC Medications - No data to display  Initial Impression / Assessment and Plan / UC Course  I have reviewed the triage vital signs and the nursing notes.  Pertinent labs & imaging results that were available during my care of the patient were reviewed by me and considered in my medical decision making (see chart for details).     For BV, clindamycin suppository as below. She has failed treatment with multiple rounds of flagyl in the last 3 months. Given this is a chronic issue for pt, rec f/u with her gyn to discuss further management. -Head to ED if severe abd pain despite treatment; new/worsening fevers/chills; chest pain; shortness of breath; etc. She verbalizes understanding and agreement.   For back pain, tylenol/ibuprofen. ROM exercises provided. Denies pain shooting down legs, denies numbness in arms/legs, denies weakness in arms/legs, denies saddle anesthesia, denies bowel/bladder incontinence. No CVAT. Denies urinary symptoms. Seek immediate medical attention if pain shooting down legs, denies numbness in arms/legs, denies weakness in arms/legs, denies saddle anesthesia, denies bowel/bladder incontinence.   Rec pt f/u with her PCP to discuss additional chronic medical conditions. Head to ED if chest pain, shortness of breath, worst headache of life, new/worsening fevers, new/worsening abd pain, etc. She verbalizes understanding and agreement.   Final Clinical Impressions(s) / UC Diagnoses   Final diagnoses:  Bacterial vaginitis     Discharge Instructions     -For bacterial vaginosis, clindamycin vaginal suppository. Use one suppository daily  for 3 days.  -For back pain, ibuprofen/tylenol and gentle range of motion exercises (information provided in the following paperwork) -Please make an appointment with your gynecologist for further evaluation of chronic BV symptoms.  -Head to ED if severe  abd pain despite treatment; new/worsening fevers/chills; chest pain; shortness of breath; etc.    ED Prescriptions    Medication Sig Dispense Auth. Provider   clindamycin (CLEOCIN) 100 MG vaginal suppository  (Status: Discontinued) Place 1 suppository (100 mg total) vaginally at bedtime. 3 suppository Hazel Sams, [name]   clindamycin (CLEOCIN) 100 MG vaginal suppository  (Status: Discontinued) Place 1 suppository (100 mg total) vaginally at bedtime. 3 suppository Hazel Sams, [name]   clindamycin (CLEOCIN) 100 MG vaginal suppository  (Status: Discontinued) Place 1 suppository (100 mg total) vaginally at bedtime. 3 suppository Hazel Sams, [name]   clindamycin (CLEOCIN) 100 MG vaginal suppository Place 1 suppository (100 mg total) vaginally at bedtime. 3 suppository Hazel Sams, [name]     PDMP not reviewed this encounter.   Hazel Sams, [name] 10/14/20 1851    Hazel Sams, [name] 10/15/20 [phone]

## 2020-10-14 NOTE — Discharge Instructions (Addendum)
-  For bacterial vaginosis, clindamycin vaginal suppository. Use one suppository daily for 3 days.  -For back pain, ibuprofen/tylenol and gentle range of motion exercises (information provided in the following paperwork) -Please make an appointment with your gynecologist for further evaluation of chronic BV symptoms.  -Head to ED if severe abd pain despite treatment; new/worsening fevers/chills; chest pain; shortness of breath; etc.

## 2020-10-14 NOTE — ED Triage Notes (Signed)
Pt presents with vaginal pain & dryness; pt states she has been treated multiple times recently for BV and is having the same recurring issues.

## 2020-10-15 LAB — CERVICOVAGINAL ANCILLARY ONLY
Bacterial Vaginitis (gardnerella): POSITIVE — AB
Candida Glabrata: NEGATIVE
Candida Vaginitis: NEGATIVE
Chlamydia: NEGATIVE
Comment: NEGATIVE
Comment: NEGATIVE
Comment: NEGATIVE
Comment: NEGATIVE
Comment: NEGATIVE
Comment: NORMAL
Neisseria Gonorrhea: NEGATIVE
Trichomonas: NEGATIVE

## 2020-10-23 ENCOUNTER — Ambulatory Visit (HOSPITAL_COMMUNITY): Payer: Medicaid Other | Admitting: Behavioral Health

## 2020-10-30 ENCOUNTER — Other Ambulatory Visit: Payer: Self-pay | Admitting: Internal Medicine

## 2020-10-30 DIAGNOSIS — M79662 Pain in left lower leg: Secondary | ICD-10-CM

## 2020-10-31 ENCOUNTER — Other Ambulatory Visit: Payer: Self-pay | Admitting: Internal Medicine

## 2020-10-31 ENCOUNTER — Other Ambulatory Visit: Payer: Self-pay | Admitting: Pulmonary Disease

## 2020-10-31 DIAGNOSIS — Z1231 Encounter for screening mammogram for malignant neoplasm of breast: Secondary | ICD-10-CM

## 2020-11-02 ENCOUNTER — Other Ambulatory Visit: Payer: Self-pay | Admitting: Internal Medicine

## 2020-11-02 ENCOUNTER — Other Ambulatory Visit: Payer: Medicaid Other

## 2020-11-02 ENCOUNTER — Ambulatory Visit
Admission: RE | Admit: 2020-11-02 | Discharge: 2020-11-02 | Disposition: A | Discharge status: Home / Self Care | Payer: Medicaid Other | Source: Ambulatory Visit | Attending: Internal Medicine | Admitting: Internal Medicine

## 2020-11-02 DIAGNOSIS — M79662 Pain in left lower leg: Secondary | ICD-10-CM

## 2020-11-02 DIAGNOSIS — U099 Post covid-19 condition, unspecified: Secondary | ICD-10-CM

## 2020-11-12 ENCOUNTER — Other Ambulatory Visit: Payer: Self-pay

## 2020-11-12 ENCOUNTER — Encounter: Payer: Self-pay | Admitting: Obstetrics & Gynecology

## 2020-11-12 ENCOUNTER — Ambulatory Visit (INDEPENDENT_AMBULATORY_CARE_PROVIDER_SITE_OTHER): Payer: Medicaid Other | Admitting: Obstetrics & Gynecology

## 2020-11-12 VITALS — BP 123/81 | HR 74 | Wt 240.0 lb

## 2020-11-12 DIAGNOSIS — R102 Pelvic and perineal pain unspecified side: Secondary | ICD-10-CM

## 2020-11-12 DIAGNOSIS — N3941 Urge incontinence: Secondary | ICD-10-CM

## 2020-11-12 NOTE — Patient Instructions (Signed)

## 2020-11-12 NOTE — Progress Notes (Signed)
Patient ID: Kristina Huffman, female   DOB: May 29, 1961, 60 y.o.   MRN: [id number]  Chief Complaint  Patient presents with  . Pelvic Pain    HPI Kristina Huffman is a 60 y.o. female.  Patient has occasional brief sharp pelvic pain both left and right side. She has urinary frequency and urge with incontinence especially after waking in the morning. Constipation currently managed with Colace. HPI  Past Medical History:  Diagnosis Date  . Aneurysm of splenic artery (HCC)   . Anxiety   . Asthma   . Bipolar disorder ([place])   . Depression   . Diabetes mellitus without complication ([place])   . Diabetic peripheral neuropathy ([place])   . Elevated LFTs   . ETOH abuse   . GERD (gastroesophageal reflux disease)   . Hepatitis C   . Heroin abuse ([place])   . History of MRSA infection    legs and spread to face  . Hypertension   . Insomnia   . Mixed hyperlipidemia   . OSA (obstructive sleep apnea)   . Sleep apnea     Past Surgical History:  Procedure Laterality Date  . BACK SURGERY    . RADIAL HEAD ARTHROPLASTY  08/09/2012   Procedure: RADIAL HEAD ARTHROPLASTY;  Surgeon: Schuyler Amor, MD;  Location: [facility];  Service: Orthopedics;  Laterality: Left;  Left Radial head Replacement  . WISDOM TOOTH EXTRACTION      Family History  Problem Relation Age of Onset  . Depression Mother   . Osteoporosis Mother   . COPD Mother   . Rheum arthritis Mother   . Diabetes Father   . Hypertension Father   . Congestive Heart Failure Father   . Aneurysm Father   . Clotting disorder Father   . Heart disease Father   . Cancer Maternal Grandfather   . Colon cancer Neg Hx   . Esophageal cancer Neg Hx   . Rectal cancer Neg Hx   . Stomach cancer Neg Hx     Social History Social History   Tobacco Use  . Smoking status: Never Smoker  . Smokeless tobacco: Never Used  Vaping Use  . Vaping Use: Never used  Substance Use Topics  . Alcohol use: Not Currently  . Drug use: Not Currently    Types: Heroin     Comment: no drug use for 3 years    Allergies  Allergen Reactions  . Sulfa Antibiotics Shortness Of Breath and Swelling    Tight in throat  . Aspirin Other (See Comments)    "Ringing in ears"    Current Outpatient Medications  Medication Sig Dispense Refill  . atorvastatin (LIPITOR) 20 MG tablet Take 20 mg by mouth at bedtime.    Marland Kitchen b complex vitamins capsule Take 1 capsule by mouth daily.    Marland Kitchen FLUoxetine (PROZAC) 40 MG capsule Take 1 capsule (40 mg total) by mouth daily. 30 capsule 0  . gabapentin (NEURONTIN) 600 MG tablet Take 1 tablet (600 mg total) by mouth 3 (three) times daily. 90 tablet 0  . hydrochlorothiazide (HYDRODIURIL) 25 MG tablet Take 1 tablet (25 mg total) by mouth daily. 30 tablet 0  . ibuprofen (ADVIL) 200 MG tablet Take 400 mg by mouth every 6 (six) hours as needed.    . naltrexone (DEPADE) 50 MG tablet Take 50 mg by mouth daily.    . prazosin (MINIPRESS) 1 MG capsule Take 1 capsule (1 mg total) by mouth at bedtime. 30 capsule 0  . QUEtiapine (SEROQUEL)  100 MG tablet Take 1 tablet (100 mg total) by mouth 2 (two) times daily. 60 tablet 0  . QUEtiapine (SEROQUEL) 200 MG tablet Take 1 tablet (200 mg total) by mouth at bedtime. 30 tablet 0  . traZODone (DESYREL) 50 MG tablet Take 1 tablet (50 mg total) by mouth at bedtime as needed for sleep. 30 tablet 0  . allopurinol (ZYLOPRIM) 300 MG tablet Take 300 mg by mouth daily.     No current facility-administered medications for this visit.    Review of Systems Review of Systems  Respiratory: Negative.   Cardiovascular: Negative.   Gastrointestinal: Positive for constipation.  Genitourinary: Positive for frequency and pelvic pain. Negative for hematuria, vaginal bleeding and vaginal discharge.    Blood pressure 123/81, pulse 74, weight 240 lb (108.9 kg), last menstrual period 08/02/2013.  Physical Exam Physical Exam Exam conducted with a chaperone present.  Constitutional:      Appearance: Normal appearance. She  is obese.  Pulmonary:     Effort: Pulmonary effort is normal.  Genitourinary:    General: Normal vulva.     Exam position: Lithotomy position.     Vagina: Normal.     Cervix: Normal.     Uterus: Normal.      Adnexa: Right adnexa normal and left adnexa normal.  Neurological:     Mental Status: She is alert.     Data Reviewed Previous office visit notes  Assessment No sign of recurrent BV today Urinary urge incontinence Pelvic pain Plan Orders Placed This Encounter  Procedures  . US PELVIC COMPLETE WITH TRANSVAGINAL    Standing Status:   Future    Standing Expiration Date:   02/09/2021    Order Specific Question:   Reason for Exam (SYMPTOM  OR DIAGNOSIS REQUIRED)    Answer:   pelvic pain    Order Specific Question:   Preferred imaging location?    Answer:   [facility]    Order Specific Question:   Release to patient    Answer:   Immediate  . Ambulatory referral to Urogynecology    Referral Priority:   Routine    Referral Type:   Consultation    Referral Reason:   Specialty Services Required    Requested Specialty:   Urology    Number of Visits Requested:   1    F/U with results   Kristina Huffman 11/12/2020, 11:15 AM

## 2020-11-22 ENCOUNTER — Other Ambulatory Visit: Payer: Self-pay

## 2020-11-22 ENCOUNTER — Ambulatory Visit
Admission: RE | Admit: 2020-11-22 | Discharge: 2020-11-22 | Disposition: A | Discharge status: Home / Self Care | Payer: Medicaid Other | Source: Ambulatory Visit | Attending: Obstetrics & Gynecology | Admitting: Obstetrics & Gynecology

## 2020-11-22 DIAGNOSIS — R102 Pelvic and perineal pain: Secondary | ICD-10-CM | POA: Insufficient documentation

## 2020-12-12 ENCOUNTER — Ambulatory Visit: Payer: Medicaid Other | Admitting: Podiatry

## 2020-12-26 NOTE — Progress Notes (Deleted)
Kristina Huffman New Patient Evaluation and Consultation  Referring Provider: Woodroe Mode, MD PCP: Kristina Hose, MD Date of Service: 12/28/2020  SUBJECTIVE Chief Complaint: No chief complaint on file.  History of Present Illness: Kristina Huffman is a 60 y.o. White or Caucasian female seen in consultation at the request of Kristina Huffman for evaluation of urinary urgency and incontinence.    Review of records from Dr Kristina Huffman significant for: Has occasional sharp bilateral pelvic pain. Has urgency and leakage, especially with waking up in the morning.   Pelvic US on 11/22/20: IMPRESSION: Suspected endometrial polyp 17 x 5 x 5 mm at the mid to lower uterine endometrial canal.; Consider sonohysterogram for further evaluation, prior to hysteroscopy or endometrial biopsy.  Urinary Symptoms: {urine leakage?:24754} Leaks *** time(s) per {days/wks/mos/yrs:310907}.  Pad use: {NUMBERS 1-10:18281} {pad option:24752} per day.   She {ACTION; IS/IS HQI:69629528} bothered by her UI symptoms.  Day time voids ***.  Nocturia: *** times per night to void. Voiding dysfunction: she {empties:24755} her bladder well.  {DOES NOT does:27190::"does not"} use a catheter to empty bladder.  When urinating, she feels {urine symptoms:24756} Drinks: *** per day  UTIs: {NUMBERS 1-10:18281} UTI's in the last year.   {ACTIONS;DENIES/REPORTS:21021675::"Denies"} history of {urologic concerns:24757}  Pelvic Organ Prolapse Symptoms:                  She {denies/ admits to:24761} a feeling of a bulge the vaginal area. It has been present for {NUMBER 1-10:22536} {days/wks/mos/yrs:310907}.  She {denies/ admits to:24761} seeing a bulge.  This bulge {ACTION; IS/IS UXL:24401027} bothersome.  Bowel Symptom: Bowel movements: *** time(s) per {Time; day/week/month:13537} Stool consistency: {stool consistency:24758} Straining: {yes/no:19897}.  Splinting: {yes/no:19897}.  Incomplete evacuation: {yes/no:19897}.   She {denies/ admits to:24761} accidental bowel leakage / fecal incontinence  Occurs: *** time(s) per {Time; day/week/month:13537}  Consistency with leakage: {stool consistency:24758} Bowel regimen: {bowel regimen:24759} Last colonoscopy: Date ***, Results ***  Sexual Function Sexually active: {yes/no:19897}.  Sexual orientation: {Sexual Orientation:[phone]} Pain with sex: {pain with sex:24762}  Pelvic Pain {denies/ admits to:24761} pelvic pain Location: *** Pain occurs: *** Prior pain treatment: *** Improved by: *** Worsened by: ***   Past Medical History:  Past Medical History:  Diagnosis Date  . Aneurysm of splenic artery (HCC)   . Anxiety   . Asthma   . Bipolar disorder ([place])   . Depression   . Diabetes mellitus without complication ([place])   . Diabetic peripheral neuropathy ([place])   . Elevated LFTs   . ETOH abuse   . GERD (gastroesophageal reflux disease)   . Hepatitis C   . Heroin abuse ([place])   . History of MRSA infection    legs and spread to face  . Hypertension   . Insomnia   . Mixed hyperlipidemia   . OSA (obstructive sleep apnea)   . Sleep apnea      Past Surgical History:   Past Surgical History:  Procedure Laterality Date  . BACK SURGERY    . RADIAL HEAD ARTHROPLASTY  08/09/2012   Procedure: RADIAL HEAD ARTHROPLASTY;  Surgeon: Schuyler Amor, MD;  Location: [facility];  Service: Orthopedics;  Laterality: Left;  Left Radial head Replacement  . WISDOM TOOTH EXTRACTION       Past OB/GYN History: G{NUMBERS 1-10:18281} P{NUMBERS 1-10:18281} Vaginal deliveries: ***,  Forceps/ Vacuum deliveries: ***, Cesarean section: *** Menopausal: {menopausal:24763} Contraception: ***. Last pap smear was ***.  Any history of abnormal pap smears: {yes/no:19897}.   Medications: She has a current medication  list which includes the following prescription(s): allopurinol, atorvastatin, b complex vitamins, fluoxetine, gabapentin, hydrochlorothiazide, ibuprofen,  naltrexone, prazosin, quetiapine, quetiapine, trazodone, and [DISCONTINUED] metformin.   Allergies: Patient is allergic to sulfa antibiotics and aspirin.   Social History:  Social History   Tobacco Use  . Smoking status: Never Smoker  . Smokeless tobacco: Never Used  Vaping Use  . Vaping Use: Never used  Substance Use Topics  . Alcohol use: Not Currently  . Drug use: Not Currently    Types: Heroin    Comment: no drug use for 3 years    Relationship status: {relationship status:24764} She lives with ***.   She {ACTION; IS/IS SEG:31517616} employed ***. Regular exercise: {Yes/No:304960894} History of abuse: {Yes/No:304960894}  Family History:   Family History  Problem Relation Age of Onset  . Depression Mother   . Osteoporosis Mother   . COPD Mother   . Rheum arthritis Mother   . Diabetes Father   . Hypertension Father   . Congestive Heart Failure Father   . Aneurysm Father   . Clotting disorder Father   . Heart disease Father   . Cancer Maternal Grandfather   . Colon cancer Neg Hx   . Esophageal cancer Neg Hx   . Rectal cancer Neg Hx   . Stomach cancer Neg Hx      Review of Systems: ROS   OBJECTIVE Physical Exam: There were no vitals filed for this visit.  Physical Exam   GU / Detailed Urogynecologic Evaluation:  Pelvic Exam: Normal external female genitalia; Bartholin's and Skene's glands normal in appearance; urethral meatus normal in appearance, no urethral masses or discharge.   CST: {gen negative/positive:315881}  Reflexes: bulbocavernosis {DESC; PRESENT/NOT PRESENT:21021351}, anocutaneous {DESC; PRESENT/NOT PRESENT:21021351} ***bilaterally.  Speculum exam reveals normal vaginal mucosa {With/Without:20273} atrophy. Cervix {exam; gyn cervix:30847}. Uterus {exam; pelvic uterus:30849}. Adnexa {exam; adnexa:12223}.    s/p hysterectomy: Speculum exam reveals normal vaginal mucosa {With/Without:20273}  atrophy and normal vaginal cuff.  Adnexa {exam;  adnexa:12223}.    With apex supported, anterior compartment defect was {reduced:24765}  Pelvic floor strength {Roman # I-V:19040}/V, puborectalis {Roman # I-V:19040}/V external anal sphincter {Roman # I-V:19040}/V  Pelvic floor musculature: Right levator {Tender/Non-tender:20250}, Right obturator {Tender/Non-tender:20250}, Left levator {Tender/Non-tender:20250}, Left obturator {Tender/Non-tender:20250}  POP-Q:   POP-Q                                               Aa                                               Ba                                                 C                                                Gh  Pb                                               tvl                                                Ap                                               Bp                                                 D     Rectal Exam:  Normal sphincter tone, {rectocele:24766} distal rectocele, enterocoele {DESC; PRESENT/NOT PRESENT:21021351}, no rectal masses, {sign of:24767} dyssynergia when asking the patient to bear down.  Post-Void Residual (PVR) by Bladder Scan: In order to evaluate bladder emptying, we discussed obtaining a postvoid residual and she agreed to this procedure.  Procedure: The ultrasound unit was placed on the patient's abdomen in the suprapubic region after the patient had voided. A PVR of *** ml was obtained by bladder scan.  Laboratory Results: @ENCLABS @   ***I visualized the urine specimen, noting the specimen to be {urine color:24768}  ASSESSMENT AND PLAN Ms. Demeo is a 60 y.o. with: No diagnosis found.    Kristina Folds, MD   Medical Decision Making:  - Reviewed/ ordered a clinical laboratory test - Reviewed/ ordered a radiologic study - Reviewed/ ordered medicine test - Decision to obtain old records - Discussion of management of or test interpretation with an external physician /  other healthcare professional  - Assessment requiring independent historian - Review and summation of prior records - Independent review of image, tracing or specimen

## 2020-12-28 ENCOUNTER — Ambulatory Visit: Payer: Medicaid Other | Admitting: Obstetrics and Gynecology

## 2021-01-04 ENCOUNTER — Ambulatory Visit: Payer: Medicaid Other | Admitting: Obstetrics and Gynecology

## 2021-02-08 ENCOUNTER — Ambulatory Visit: Payer: Medicaid Other | Admitting: Obstetrics and Gynecology

## 2021-02-25 ENCOUNTER — Ambulatory Visit: Payer: Medicaid Other | Admitting: Podiatry

## 2021-03-13 ENCOUNTER — Ambulatory Visit: Payer: Medicaid Other | Admitting: Podiatry

## 2021-04-06 ENCOUNTER — Ambulatory Visit (HOSPITAL_COMMUNITY)
Admission: EM | Admit: 2021-04-06 | Discharge: 2021-04-06 | Disposition: A | Discharge status: Home / Self Care | Payer: Medicaid Other | Attending: Urology | Admitting: Urology

## 2021-04-06 ENCOUNTER — Other Ambulatory Visit: Payer: Self-pay

## 2021-04-06 DIAGNOSIS — Z79899 Other long term (current) drug therapy: Secondary | ICD-10-CM | POA: Insufficient documentation

## 2021-04-06 DIAGNOSIS — F419 Anxiety disorder, unspecified: Secondary | ICD-10-CM | POA: Insufficient documentation

## 2021-04-06 DIAGNOSIS — F10220 Alcohol dependence with intoxication, uncomplicated: Secondary | ICD-10-CM | POA: Insufficient documentation

## 2021-04-06 DIAGNOSIS — F3132 Bipolar disorder, current episode depressed, moderate: Secondary | ICD-10-CM | POA: Insufficient documentation

## 2021-04-06 DIAGNOSIS — E782 Mixed hyperlipidemia: Secondary | ICD-10-CM | POA: Insufficient documentation

## 2021-04-06 DIAGNOSIS — Z20822 Contact with and (suspected) exposure to covid-19: Secondary | ICD-10-CM | POA: Diagnosis not present

## 2021-04-06 DIAGNOSIS — G4700 Insomnia, unspecified: Secondary | ICD-10-CM | POA: Insufficient documentation

## 2021-04-06 DIAGNOSIS — R45851 Suicidal ideations: Secondary | ICD-10-CM | POA: Insufficient documentation

## 2021-04-06 LAB — LIPID PANEL
Cholesterol: 170 mg/dL (ref 0–200)
HDL: 49 mg/dL (ref >40)
LDL Cholesterol: 75 mg/dL (ref 0–99)
Total CHOL/HDL Ratio: 3.5 RATIO
Triglycerides: 230 mg/dL — ABNORMAL HIGH (ref <150)
VLDL: 46 mg/dL — ABNORMAL HIGH (ref 0–40)

## 2021-04-06 LAB — CBC WITH DIFFERENTIAL/PLATELET
Abs Immature Granulocytes: 0.01 10*3/uL (ref 0.00–0.07)
Basophils Absolute: 0.1 10*3/uL (ref 0.0–0.1)
Basophils Relative: 1 %
Eosinophils Absolute: 0.1 10*3/uL (ref 0.0–0.5)
Eosinophils Relative: 2 %
HCT: 44.4 % (ref 36.0–46.0)
Hemoglobin: 15.2 g/dL — ABNORMAL HIGH (ref 12.0–15.0)
Immature Granulocytes: 0 %
Lymphocytes Relative: 52 %
Lymphs Abs: 3 10*3/uL (ref 0.7–4.0)
MCH: 31.3 pg (ref 26.0–34.0)
MCHC: 34.2 g/dL (ref 30.0–36.0)
MCV: 91.4 fL (ref 80.0–100.0)
Monocytes Absolute: 0.5 10*3/uL (ref 0.1–1.0)
Monocytes Relative: 9 %
Neutro Abs: 2.1 10*3/uL (ref 1.7–7.7)
Neutrophils Relative %: 36 %
Platelets: 275 10*3/uL (ref 150–400)
RBC: 4.86 MIL/uL (ref 3.87–5.11)
RDW: 12.8 % (ref 11.5–15.5)
WBC: 5.7 10*3/uL (ref 4.0–10.5)
nRBC: 0 % (ref 0.0–0.2)

## 2021-04-06 LAB — COMPREHENSIVE METABOLIC PANEL
ALT: 52 U/L — ABNORMAL HIGH (ref 0–44)
AST: 40 U/L (ref 15–41)
Albumin: 4.2 g/dL (ref 3.5–5.0)
Alkaline Phosphatase: 94 U/L (ref 38–126)
Anion gap: 10 (ref 5–15)
BUN: 15 mg/dL (ref 6–20)
CO2: 24 mmol/L (ref 22–32)
Calcium: 9.5 mg/dL (ref 8.9–10.3)
Chloride: 100 mmol/L (ref 98–111)
Creatinine, Ser: 0.71 mg/dL (ref 0.44–1.00)
GFR, Estimated: >60 mL/min (ref >60)
Glucose, Bld: 113 mg/dL — ABNORMAL HIGH (ref 70–99)
Potassium: 3.5 mmol/L (ref 3.5–5.1)
Sodium: 134 mmol/L — ABNORMAL LOW (ref 135–145)
Total Bilirubin: 1.2 mg/dL (ref 0.3–1.2)
Total Protein: 7 g/dL (ref 6.5–8.1)

## 2021-04-06 LAB — HEMOGLOBIN A1C
Hgb A1c MFr Bld: 6 % — ABNORMAL HIGH (ref 4.8–5.6)
Mean Plasma Glucose: 125.5 mg/dL

## 2021-04-06 LAB — RESP PANEL BY RT-PCR (FLU A&B, COVID) ARPGX2
Influenza A by PCR: NEGATIVE
Influenza B by PCR: NEGATIVE
SARS Coronavirus 2 by RT PCR: NEGATIVE

## 2021-04-06 LAB — TSH: TSH: 6.879 u[IU]/mL — ABNORMAL HIGH (ref 0.350–4.500)

## 2021-04-06 LAB — ETHANOL: Alcohol, Ethyl (B): 270 mg/dL — ABNORMAL HIGH (ref <10)

## 2021-04-06 LAB — POC SARS CORONAVIRUS 2 AG: SARSCOV2ONAVIRUS 2 AG: NEGATIVE

## 2021-04-06 MED ORDER — ACETAMINOPHEN 325 MG PO TABS: 650.0000 mg | ORAL_TABLET | Freq: Four times a day (QID) | ORAL | Status: DC | PRN

## 2021-04-06 MED ORDER — ALUM & MAG HYDROXIDE-SIMETH 200-200-20 MG/5ML PO SUSP: 30.0000 mL | ORAL | Status: DC | PRN

## 2021-04-06 MED ORDER — ADULT MULTIVITAMIN W/MINERALS CH
1.0000 | ORAL_TABLET | Freq: Every day | ORAL | Status: DC
Start: 2021-04-06 — End: 2021-04-06
  Administered 2021-04-06: 1 via ORAL
  Filled 2021-04-06: qty 1

## 2021-04-06 MED ORDER — THIAMINE HCL 100 MG/ML IJ SOLN
100.0000 mg | Freq: Once | INTRAMUSCULAR | Status: AC
  Administered 2021-04-06: 100 mg via INTRAMUSCULAR
  Filled 2021-04-06: qty 2

## 2021-04-06 MED ORDER — ONDANSETRON 4 MG PO TBDP: 8.0000 mg | ORAL_TABLET | Freq: Once | ORAL | Status: DC

## 2021-04-06 MED ORDER — LORAZEPAM 1 MG PO TABS: 1.0000 mg | ORAL_TABLET | Freq: Four times a day (QID) | ORAL | Status: DC | PRN

## 2021-04-06 MED ORDER — HYDROXYZINE HCL 25 MG PO TABS
25.0000 mg | ORAL_TABLET | Freq: Four times a day (QID) | ORAL | Status: DC | PRN
  Filled 2021-04-06: qty 1

## 2021-04-06 MED ORDER — ONDANSETRON 4 MG PO TBDP
4.0000 mg | ORAL_TABLET | Freq: Four times a day (QID) | ORAL | Status: DC | PRN
  Administered 2021-04-06: 4 mg via ORAL
  Filled 2021-04-06: qty 1

## 2021-04-06 MED ORDER — THIAMINE HCL 100 MG PO TABS: 100.0000 mg | ORAL_TABLET | Freq: Every day | ORAL | Status: DC

## 2021-04-06 MED ORDER — TRAZODONE HCL 50 MG PO TABS: 150.0000 mg | ORAL_TABLET | Freq: Every evening | ORAL | 0 refills | Status: DC | PRN

## 2021-04-06 MED ORDER — TRAZODONE HCL 50 MG PO TABS: 50.0000 mg | ORAL_TABLET | Freq: Every evening | ORAL | Status: DC | PRN

## 2021-04-06 MED ORDER — MAGNESIUM HYDROXIDE 400 MG/5ML PO SUSP: 30.0000 mL | Freq: Every day | ORAL | Status: DC | PRN

## 2021-04-06 MED ORDER — LORAZEPAM 1 MG PO TABS
2.0000 mg | ORAL_TABLET | Freq: Once | ORAL | Status: AC
  Administered 2021-04-06: 2 mg via ORAL
  Filled 2021-04-06: qty 2

## 2021-04-06 MED ORDER — LOPERAMIDE HCL 2 MG PO CAPS: 2.0000 mg | ORAL_CAPSULE | ORAL | Status: DC | PRN

## 2021-04-06 NOTE — ED Provider Notes (Signed)
FBC/OBS ASAP Discharge Summary  Date and Time: 04/06/2021 12:23 PM  Name: Kristina Huffman  MRN:  [id number]   Discharge Diagnoses:  Final diagnoses:  Bipolar affective disorder, currently depressed, moderate ([place])  Alcohol dependence with uncomplicated intoxication ([place])    Subjective:    Kristina Huffman, 60 y.o., female patient presented to [facility]. She has a psychiatric history of Bipolar disorder, anxiety, suicidal ideation, and alcohol abuse. Patient was intoxicated on admission. Patient seen face to face by this provider, Dr. Dwyane Dee; and  hart reviewed on 04/06/21.     During evaluation Kristina Huffman is laying in bed. She is alert/oriented x 4; calm/cooperative. States she is depressed and mood is congruent.  Patient is speaking in a clear tone at moderate volume, and normal pace; with good eye contact.  Her thought process is coherent and relevant; There is no indication that she is currently responding to internal/external stimuli or experiencing delusional thought content.  Patient denies suicidal/self-harm/homicidal ideation, psychosis, and paranoia.  Patient contracts for safety and denies any access to firearms/weapons.States she has no concerns with her appetite but states she is only sleeping 5-7 hours per night. States her problem is being able to get to sleep. Patient is requesting medication adjustment or a new medication to help her get to sleep. Patient has remained calm throughout assessment and has answered questions appropriately.   Patient states she has had a rough time over the years dealing with the loss of her son since  2019. States she does not have any out patient services in place.  States that she sees Dr.Izzy every few months for medication management states she has had an inpatient psychiatric admissions in the past.  Endorses alcohol use denies any other substance use.  States she drinks 2 to 3 glasses of wine when she gets depressed.  Denies using alcohol daily.    Stay Summary:   Kristina Huffman was admitted to [facility] Continuous Assessment unit for suicidal ideations, alcohol intoxication and crisis management. Patient was monitored by continuous assessment/observation. Her emotional and mental status was also monitored by staff. Patient reports that she is no longer suicidal and she is ready to be discharged home. States when she drinks alcohol it intensifies her depression and makes her suicidal. She was offered further treatment options upon discharge including but not limited to Residential, Intensive Outpatient, Outpatient treatment, Rehabilitation services, patient declined.  Kristina Huffman agrees to follow up with Dr. Altamese Odessa, her current outpatient psychiatric provider and he PCP for elevated TSH level (6.879). Prescription for Trazodone 150 PO QHS was sent to [facility], [facility].  Provided outpatient psychiatric resources.  Denies IOP/PHP.   Upon completion of this admission the Kristina Huffman was both mentally and medically stable for discharge denying suicidal/homicidal ideation, auditory/visual/tactile hallucinations, delusional thoughts and paranoia.     Total Time spent with patient: 30 minutes  Past Psychiatric History: Bipolar, depression Past Medical History:  Past Medical History:  Diagnosis Date   Aneurysm of splenic artery (HCC)    Anxiety    Asthma    Bipolar disorder ([place])    Depression    Diabetes mellitus without complication ([place])    Diabetic peripheral neuropathy (HCC)    Elevated LFTs    ETOH abuse    GERD (gastroesophageal reflux disease)    Hepatitis C    Heroin abuse ([place])    History of MRSA infection    legs and spread to face   Hypertension    Insomnia  Mixed hyperlipidemia    OSA (obstructive sleep apnea)    Sleep apnea     Past Surgical History:  Procedure Laterality Date   BACK SURGERY     RADIAL HEAD ARTHROPLASTY  08/09/2012   Procedure: RADIAL HEAD ARTHROPLASTY;  Surgeon: Schuyler Amor, MD;   Location: [facility];  Service: Orthopedics;  Laterality: Left;  Left Radial head Replacement   WISDOM TOOTH EXTRACTION     Family History: unknown Family History  Problem Relation Age of Onset   Depression Mother    Osteoporosis Mother    COPD Mother    Rheum arthritis Mother    Diabetes Father    Hypertension Father    Congestive Heart Failure Father    Aneurysm Father    Clotting disorder Father    Heart disease Father    Cancer Maternal Grandfather    Colon cancer Neg Hx    Esophageal cancer Neg Hx    Rectal cancer Neg Hx    Stomach cancer Neg Hx    Family Psychiatric History: unknown  Social History:  Social History   Substance and Sexual Activity  Alcohol Use Not Currently     Social History   Substance and Sexual Activity  Drug Use Not Currently   Types: Heroin   Comment: no drug use for 3 years    Social History   Socioeconomic History   Marital status: Married    Spouse name: Not on file   Number of children: 4   Years of education: Not on file   Highest education level: Not on file  Occupational History   Not on file  Tobacco Use   Smoking status: Never   Smokeless tobacco: Never  Vaping Use   Vaping Use: Never used  Substance and Sexual Activity   Alcohol use: Not Currently   Drug use: Not Currently    Types: Heroin    Comment: no drug use for 3 years   Sexual activity: Yes    Birth control/protection: None  Other Topics Concern   Not on file  Social History Narrative   Not on file   Social Determinants of Health   Financial Resource Strain: Not on file  Food Insecurity: Food Insecurity Present   Worried About [place] in the Last Year: Sometimes true   Ran Out of Food in the Last Year: Sometimes true  Transportation Needs: No Transportation Needs   Lack of Transportation (Medical): No   Lack of Transportation (Non-Medical): No  Physical Activity: Not on file  Stress: Not on file  Social Connections: Not on file   SDOH:   SDOH Screenings   Alcohol Screen: Not on file  Depression (PHQ2-9): Medium Risk   PHQ-2 Score: 5  Financial Resource Strain: Not on file  Food Insecurity: Food Insecurity Present   Worried About Charity fundraiser in the Last Year: Sometimes true   Arboriculturist in the Last Year: Sometimes true  Housing: Not on file  Physical Activity: Not on file  Social Connections: Not on file  Stress: Not on file  Tobacco Use: Low Risk    Smoking Tobacco Use: Never   Smokeless Tobacco Use: Never  Transportation Needs: No Transportation Needs   Lack of Transportation (Medical): No   Lack of Transportation (Non-Medical): No    Tobacco Cessation:  N/A, patient does not currently use tobacco products  Current Medications:  Current Facility-Administered Medications  Medication Dose Route Frequency Provider Last Rate Last Admin  acetaminophen (TYLENOL) tablet 650 mg  650 mg Oral Q6H PRN Ajibola, Ene A, NP       alum & mag hydroxide-simeth (MAALOX/MYLANTA) 200-200-20 MG/5ML suspension 30 mL  30 mL Oral Q4H PRN Ajibola, Ene A, NP       hydrOXYzine (ATARAX/VISTARIL) tablet 25 mg  25 mg Oral Q6H PRN Ajibola, Ene A, NP       loperamide (IMODIUM) capsule 2-4 mg  2-4 mg Oral PRN Ajibola, Ene A, NP       LORazepam (ATIVAN) tablet 1 mg  1 mg Oral Q6H PRN Ajibola, Ene A, NP       magnesium hydroxide (MILK OF MAGNESIA) suspension 30 mL  30 mL Oral Daily PRN Ajibola, Ene A, NP       multivitamin with minerals tablet 1 tablet  1 tablet Oral Daily Ajibola, Ene A, NP   1 tablet at 04/06/21 0912   ondansetron (ZOFRAN-ODT) disintegrating tablet 4 mg  4 mg Oral Q6H PRN Ajibola, Ene A, NP   4 mg at 04/06/21 6333   [START ON 04/07/2021] thiamine tablet 100 mg  100 mg Oral Daily Ajibola, Ene A, NP       traZODone (DESYREL) tablet 50 mg  50 mg Oral QHS PRN Ajibola, Ene A, NP       Current Outpatient Medications  Medication Sig Dispense Refill   allopurinol (ZYLOPRIM) 300 MG tablet Take 300 mg by mouth daily.      atorvastatin (LIPITOR) 20 MG tablet Take 20 mg by mouth at bedtime.     b complex vitamins capsule Take 1 capsule by mouth daily.     FLUoxetine (PROZAC) 40 MG capsule Take 1 capsule (40 mg total) by mouth daily. 30 capsule 0   gabapentin (NEURONTIN) 600 MG tablet Take 1 tablet (600 mg total) by mouth 3 (three) times daily. 90 tablet 0   hydrochlorothiazide (HYDRODIURIL) 25 MG tablet Take 1 tablet (25 mg total) by mouth daily. 30 tablet 0   ibuprofen (ADVIL) 200 MG tablet Take 400 mg by mouth every 6 (six) hours as needed.     naltrexone (DEPADE) 50 MG tablet Take 50 mg by mouth daily.     prazosin (MINIPRESS) 1 MG capsule Take 1 capsule (1 mg total) by mouth at bedtime. 30 capsule 0   QUEtiapine (SEROQUEL) 100 MG tablet Take 1 tablet (100 mg total) by mouth 2 (two) times daily. 60 tablet 0   QUEtiapine (SEROQUEL) 200 MG tablet Take 1 tablet (200 mg total) by mouth at bedtime. 30 tablet 0   traZODone (DESYREL) 50 MG tablet Take 3 tablets (150 mg total) by mouth at bedtime as needed for sleep. 30 tablet 0    PTA Medications: (Not in a hospital admission)   Musculoskeletal  Strength & Muscle Tone: within normal limits Gait & Station: normal Patient leans: N/A  Psychiatric Specialty Exam  Presentation  General Appearance: Disheveled  Eye Contact:Good  Speech:Clear and Coherent; Normal Rate  Speech Volume:Normal  Handedness:Right   Mood and Affect  Mood:Depressed  Affect:Congruent   Thought Process  Thought Processes:Coherent  Descriptions of Associations:Intact  Orientation:Full (Time, Place and Person)  Thought Content:Logical  Diagnosis of Schizophrenia or Schizoaffective disorder in past: No    Hallucinations:Hallucinations: None  Ideas of Reference:None  Suicidal Thoughts:Suicidal Thoughts: No SI Active Intent and/or Plan: Without Intent; Without Plan; With Means to Carry Out  Homicidal Thoughts:Homicidal Thoughts: No   Sensorium  Memory:Immediate Good;  Immediate Poor; Remote Good  Judgment:Fair  Insight:Good  Executive Functions  Concentration:Good  Attention Span:Good  [place] of Knowledge:Good  Language:Good   Psychomotor Activity  Psychomotor Activity:Psychomotor Activity: Normal   Assets  Assets:Communication Skills; Desire for Improvement; Financial Resources/Insurance; Housing; Physical Health   Sleep  Sleep:Sleep: Fair Number of Hours of Sleep: 5   Nutritional Assessment (For OBS and FBC admissions only) Has the patient had a weight loss or gain of 10 pounds or more in the last 3 months?: No Has the patient had a decrease in food intake/or appetite?: No Does the patient have dental problems?: No Does the patient have eating habits or behaviors that may be indicators of an eating disorder including binging or inducing vomiting?: No Has the patient recently lost weight without trying?: No Has the patient been eating poorly because of a decreased appetite?: No Malnutrition Screening Tool Score: 0    Physical Exam  Physical Exam Vitals and nursing note reviewed.  Constitutional:      General: She is not in acute distress.    Appearance: Normal appearance. She is not ill-appearing.  HENT:     Head: Normocephalic.  Eyes:     General:        Right eye: No discharge.        Left eye: No discharge.     Conjunctiva/sclera: Conjunctivae normal.  Cardiovascular:     Rate and Rhythm: Normal rate.  Pulmonary:     Effort: Pulmonary effort is normal. No respiratory distress.  Musculoskeletal:        General: Normal range of motion.     Cervical back: Normal range of motion.  Skin:    General: Skin is warm and dry.  Neurological:     Mental Status: She is alert and oriented to person, place, and time.  Psychiatric:        Attention and Perception: Attention and perception normal.        Mood and Affect: Mood is depressed.        Speech: Speech normal.        Behavior: Behavior is cooperative.         Thought Content: Thought content normal.        Cognition and Memory: Cognition normal.        Judgment: Judgment is impulsive.   Review of Systems  Constitutional: Negative.   HENT: Negative.  Negative for hearing loss.   Eyes: Negative.   Respiratory:  Negative for cough.   Cardiovascular: Negative.   Musculoskeletal: Negative.   Skin: Negative.   Neurological:  Positive for headaches.  Psychiatric/Behavioral:  Positive for depression.   Blood pressure 127/82, pulse 87, temperature 97.8 F (36.6 C), temperature source Oral, resp. rate 16, last menstrual period 08/02/2013, SpO2 95 %. There is no height or weight on file to calculate BMI.  Demographic Factors:  Caucasian and Living alone  Loss Factors: NA  Historical Factors: Impulsivity  Risk Reduction Factors:   Sense of responsibility to family  Continued Clinical Symptoms:  Bipolar Disorder:   Depressive phase Depression:   Impulsivity Alcohol/Substance Abuse/Dependencies  Cognitive Features That Contribute To Risk:  None    Suicide Risk:  Minimal: No identifiable suicidal ideation.  Patients presenting with no risk factors but with morbid ruminations; may be classified as minimal risk based on the severity of the depressive symptoms  Plan Of Care/Follow-up recommendations:  Activity:  as tolerated  Diet:  Regular   Disposition:   Discharge patient. Patient is requesting to be discharged and does not meet  inpatient psychiatric criteria.   States she takes 100 mg Trazodone PO QHS for sleep, This provider Increased to 150 mg PO QHS. Prescription was set to [facility] pharmacy, [facility]. Educated on medication and side effects. P  Instructed to follow up with PCP regarding TSH level of 6.879. Educated on Hyperthyroidism. Patient agrees to call PCP asap.   No evidence of imminent risk to self or others at present.    Patient does not meet criteria for psychiatric inpatient admission. Discussed crisis plan,  support from social network, calling 911, coming to the Emergency Department, and calling Suicide Hotline.   Revonda Humphrey, NP 04/06/2021, 12:23 PM

## 2021-04-06 NOTE — ED Notes (Signed)
Discharge instructions provided and Pt stated understanding. Personal belongings returned from locker. Pt alert, orient and ambulatory. Pt requested to call her own cab for transportation home. Pt escorted to the fron tlobby. Safety maintained.

## 2021-04-06 NOTE — ED Notes (Signed)
Pt refused Vistaril because it was not Ativan. Explained to Pt that her CIWA was not scored high enough to receive Ativan. Safety maintained and will continue to monitor.

## 2021-04-06 NOTE — Discharge Instructions (Addendum)

## 2021-04-06 NOTE — ED Notes (Addendum)
Pt admitted to continuous assessment due to SI and ETOH problem. Pt A&O to person, appears intoxicated and reports she has been drinking wine. Pt appears anxious, is very tearful, and easily agitated, yelling out answers to questions asked by staff and cussing loudly. Pt tolerated lab work and skin assessment well. Pt vomited in assessment room. PRN medication with sips of water given. Pt ambulated independently to unit. Oriented to unit/staff. No signs of acute distress noted. Will continue to monitor for safety.

## 2021-04-06 NOTE — ED Notes (Signed)
Patient sleeping.  RR even and unlabored.  Continue to monitor for safety.

## 2021-04-06 NOTE — ED Notes (Signed)
Pt resting on pullout bed w/o s&s of distress. Accepted morning meds w.o difficulty. Breakfast provided earlier. Safety maintained.

## 2021-04-06 NOTE — BH Assessment (Signed)
Comprehensive Clinical Assessment (CCA) Note  04/06/2021 [facility] [id number]  Discharge Disposition: Kristina Reasoner, NP, reviewed pt's chart and information and met with pt and determined pt meets inpatient criteria. Pt's referral information will be provided to [facility], [facility] for review and potential acceptance; if no appropriate bed is available, pt's referral information will be faxed out to multiple hospitals for potential placement.  The patient demonstrates the following risk factors for suicide: Chronic risk factors for suicide include: psychiatric disorder of Alcohol-induced depressive disorder, With moderate or severe use disorder, substance use disorder, and previous suicide attempts 2 weeks ago . Acute risk factors for suicide include: family or marital conflict, social withdrawal/isolation, and loss (financial, interpersonal, professional). Protective factors for this patient include:  none . Considering these factors, the overall suicide risk at this point appears to be high. Patient is not appropriate for outpatient follow up.  Therefore, a 1:1 sitter is recommended for suicide precautions.  [facility] from 04/06/2021 in [facility]/2020 [facility] from 09/23/2020 in [facility] CATEGORY High Risk No Risk Moderate Risk     Chief Complaint:  Chief Complaint  Patient presents with   Suicidal   Alcohol Problem   Visit Diagnosis: F10.24, Alcohol-induced depressive disorder, With moderate or severe use disorder   CCA Screening, Triage and Referral (STR) [facility] is a 60 year old patient who called 911 and requested an ambulance take her to the [facility] for a mental health assessment. Pt was brought to the [facility] Pt expressed needing help but was unable to specify what she needed. Pt cried, wailed, back-talked, and yelled at  clinician, NP, and RN.   Pt endorses SI; she states she attempted to kill herself 2 weeks ago. Pt was unable to identify when the last time she was hospitalized was. Pt was unable to verify as to whether she has a plan to kill herself.  Pt denies HI, AVH, NSSIB, access to guns/weapons, or engagement with the legal system. Pt endorses the use of EtOH but refused to disclose how much or how frequently, though she does acknowledge she experiences seizures, sweats, and upset stomach, etc.  Pt is oriented x3. Her memory is UTA. Pt was argumentative throughout the assessment process. Pt's insight, judgement, and impulse control is poor at this time.  Patient Reported Information How did you hear about [facility]? Self  What Is the Reason for Your Visit/Call Today? Pt states she called an ambulance because she's been experiencing SI.  How Long Has This Been Causing You Problems? > than 6 months  What Do You Feel Would Help You the Most Today? Treatment for Depression or other mood problem   Have You Recently Had Any Thoughts About Hurting Yourself? Yes  Are You Planning to Commit Suicide/Harm Yourself At This time? No   Have you Recently Had Thoughts About [facility]? No  Are You Planning to Harm Someone at This Time? No  Explanation: No data recorded  Have You Used Any Alcohol or Drugs in the Past 24 Hours? Yes  How Long Ago Did You Use Drugs or Alcohol? 0000 (UTA)  What Did You Use and How Much? EtOH - unable to identify how much   Do You Currently Have a Therapist/Psychiatrist? Yes  Name of Therapist/Psychiatrist: UTA   Have You Been Recently Discharged From Any Office Practice or Programs? No  Explanation of Discharge From  Practice/Program: No data recorded    CCA Screening Triage Referral Assessment Type of Contact: Face-to-Face  Telemedicine Service Delivery:   Is this Initial or Reassessment? No data recorded Date Telepsych consult ordered in [facility]:  No data  recorded Time Telepsych consult ordered in [facility]:  No data recorded Location of Assessment: [facility]  Provider Location: [facility]   Collateral Involvement: Pt declines having friends/family for clinician to make contact with for collateral.   Does Patient Have a [place]? No data recorded Name and Contact of Legal Guardian: No data recorded If Minor and Not Living with Parent(s), Who has Custody? N/A  Is CPS involved or ever been involved? -- (UTA)  Is APS involved or ever been involved? Never   Patient Determined To Be At Risk for Harm To Self or Others Based on Review of Patient Reported Information or Presenting Complaint? Yes, for Self-Harm  Method: No data recorded Availability of Means: No data recorded Intent: No data recorded Notification Required: No data recorded Additional Information for Danger to Others Potential: No data recorded Additional Comments for Danger to Others Potential: No data recorded Are There Guns or Other Weapons in Your Home? No data recorded Types of Guns/Weapons: No data recorded Are These Weapons Safely Secured?                            No data recorded Who Could Verify You Are Able To Have These Secured: No data recorded Do You Have any Outstanding Charges, Pending Court Dates, Parole/Probation? No data recorded Contacted To Inform of Risk of Harm To Self or Others: -- (Pt denied having anyone clinician can make contact with)    Does Patient Present under Involuntary Commitment? No  IVC Papers Initial File Date: No data recorded  [place] of Residence: [place]   Patient Currently Receiving the Following Services: Medication Management (Pt's PCP, Dr. Altamese [place], prescribes her Prozac. UTA whether pt receives mental health services from anyone else.)   Determination of Need: Emergent (2 hours)   Options For Referral: Inpatient Hospitalization     CCA Biopsychosocial Patient  Reported Schizophrenia/Schizoaffective Diagnosis in Past: No   Strengths: Pt was able to identify that she was in need of assistance for her mental health. (UTA)   Mental Health Symptoms Depression:   Irritability; Tearfulness; Hopelessness   Duration of Depressive symptoms:  Duration of Depressive Symptoms: Greater than two weeks   Mania:   None   Anxiety:    None   Psychosis:   None   Duration of Psychotic symptoms:  Duration of Psychotic Symptoms: N/A   Trauma:   Avoids reminders of event   Obsessions:   None   Compulsions:   None   Inattention:   None   Hyperactivity/Impulsivity:   Blurts out answers; Feeling of restlessness; Talks excessively   Oppositional/Defiant Behaviors:   Aggression towards people/animals; Angry; Argumentative; Easily annoyed; Temper   Emotional Irregularity:   Chronic feelings of emptiness; Frantic efforts to avoid abandonment; Intense/inappropriate anger; Mood lability; Potentially harmful impulsivity; Recurrent suicidal behaviors/gestures/threats; Unstable self-image   Other Mood/Personality Symptoms:   None noted    Mental Status Exam Appearance and self-care  Stature:   Average   Weight:   Overweight   Clothing:   Careless/inappropriate; Disheveled   Grooming:   Normal   Cosmetic use:   None   Posture/gait:   Normal   Motor activity:   Slowed  Sensorium  Attention:   Confused   Concentration:   Scattered; Focuses on irrelevancies   Orientation:   Person; Object; Situation   Recall/memory:   Defective in Short-term   Affect and Mood  Affect:   Blunted; Tearful; Full Range   Mood:   Anxious; Depressed; Irritable; Worthless; Angry; Pessimistic; Negative   Relating  Eye contact:   Fleeting   Facial expression:   Angry; Responsive; Sad; Tense; Depressed   Attitude toward examiner:   Argumentative; Resistant; Defensive; Dramatic; Hostile; Irritable; Guarded; Uninterested   Thought and  Language  Speech flow:  Loud; Pressured; Profane   Thought content:   Persecutions   Preoccupation:   Other (Comment) (Pt's son dying at age 36, her mother dying, her 71 year old daughter being cared for by someone else.)   Hallucinations:   None   Organization:  No data recorded  Transport planner of Knowledge:   Average   Intelligence:   Average   Abstraction:   Normal   Judgement:   Poor   Reality Testing:   Adequate   Insight:   Poor   Decision Making:   Impulsive   Social Functioning  Social Maturity:   Impulsive   Social Judgement:   Victimized   Stress  Stressors:   Grief/losses   Coping Ability:   Deficient supports; Overwhelmed   Skill Deficits:   Self-care; Self-control   Supports:   Friends/Service system     Religion: Religion/Spirituality Are You A Religious Person?:  (Not assessed) How Might This Affect Treatment?: Not assessed  Leisure/Recreation: Leisure / Recreation Do You Have Hobbies?:  (Not assessed)  Exercise/Diet: Exercise/Diet Do You Exercise?:  (Not assessed) Have You Gained or Lost A Significant Amount of Weight in the Past Six Months?:  (Not assessed) Do You Follow a Special Diet?:  (Not assessed) Do You Have Any Trouble Sleeping?:  (Not assessed) Explanation of Sleeping Difficulties: Unknown   CCA Employment/Education Employment/Work Situation: Employment / Work Situation Employment Situation:  (Not assessed) Patient's Job has Been Impacted by Current Illness:  (Not assessed) Has Patient ever Been in the [employer]?: No  Education: Education Is Patient Currently Attending School?: No Last Grade Completed:  (Not assessed) Did You Attend College?:  (Not assessed) Did You Have An Individualized Education Program (IIEP):  (Not assessed) Did You Have Any Difficulty At School?:  (Not assessed) Patient's Education Has Been Impacted by Current Illness:  (Not assessed)   CCA Family/Childhood  History Family and Relationship History: Family history Marital status:  (Not assessed) Widowed, when?: N/A Does patient have children?: Yes How many children?: 2 How is patient's relationship with their children?: Pt reported that she has a son who died at age 59  Childhood History:  Childhood History By whom was/is the patient raised?:  (Not assessed) Did patient suffer any verbal/emotional/physical/sexual abuse as a child?:  (Not assessed) Did patient suffer from severe childhood neglect?:  (Not assessed) Has patient ever been sexually abused/assaulted/raped as an adolescent or adult?:  (Not assessed) Was the patient ever a victim of a crime or a disaster?:  (Not assessed) Witnessed domestic violence?:  (Not assessed) Has patient been affected by domestic violence as an adult?:  (Not assessed)  Child/Adolescent Assessment:     CCA Substance Use Alcohol/Drug Use: Alcohol / Drug Use Pain Medications: See MRN Prescriptions: See MRN Over the Counter: See MRN History of alcohol / drug use?: Yes Longest period of sobriety (when/how long): UTA Negative Consequences of Use: Personal relationships Withdrawal Symptoms:  Agitation, Tremors, Nausea / Vomiting, Irritability, Seizures Onset of Seizures: Pt could not recollect Date of most recent seizure: Pt could not recollect Substance #1 Name of Substance 1: EtOH 1 - Age of First Use: Unknown 1 - Amount (size/oz): Pt would not disclose 1 - Frequency: Pt would not disclose 1 - Duration: Unknown 1 - Last Use / Amount: Tonight 1 - Method of Aquiring: Purchase 1- Route of Use: Oral                       ASAM's:  Six Dimensions of Multidimensional Assessment  Dimension 1:  Acute Intoxication and/or Withdrawal Potential:   Dimension 1:  Description of individual's past and current experiences of substance use and withdrawal: Pt states she's experienced vomiting, nauseousness, seizures, sweats, etc.  Dimension 2:   Biomedical Conditions and Complications:   Dimension 2:  Description of patient's biomedical conditions and  complications: None noted  Dimension 3:  Emotional, Behavioral, or Cognitive Conditions and Complications:  Dimension 3:  Description of emotional, behavioral, or cognitive conditions and complications: Pt has difficulties managing her emotions  Dimension 4:  Readiness to Change:  Dimension 4:  Description of Readiness to Change criteria: Pt does not mention a desire to stop drinking  Dimension 5:  Relapse, Continued use, or Continued Problem Potential:  Dimension 5:  Relapse, continued use, or continued problem potential critiera description: Pt has not noted a desire to stop drinking  Dimension 6:  Recovery/Living Environment:  Dimension 6:  Recovery/Iiving environment criteria description: Pt lives independently  ASAM Severity Score: ASAM's Severity Rating Score: 8  ASAM Recommended Level of Treatment: ASAM Recommended Level of Treatment: Level II Partial Hospitalization Treatment   Substance use Disorder (SUD) Substance Use Disorder (SUD)  Checklist Symptoms of Substance Use: Evidence of withdrawal (Comment), Recurrent use that results in a failure to fulfill major role obligations (work, school, home)  Recommendations for Services/Supports/Treatments: Recommendations for Services/Supports/Treatments Recommendations For Services/Supports/Treatments: Inpatient Hospitalization  Discharge Disposition: Kristina Reasoner, NP, reviewed pt's chart and information and met with pt and determined pt meets inpatient criteria. Pt's referral information will be provided to [facility] for review and potential acceptance; if no appropriate bed is available, pt's referral information will be faxed out to multiple hospitals for potential placement.  DSM5 Diagnoses: Patient Active Problem List   Diagnosis Date Noted   Obstructive sleep apnea syndrome 05/08/2020   Acute respiratory failure due to COVID-19 [facility])  10/14/2019   ARF (acute renal failure) ([place]) 10/14/2019   Essential hypertension 10/14/2019   Controlled type 2 diabetes mellitus with hyperglycemia ([place]) 10/14/2019   Alcohol use disorder, severe, dependence ([place]) 06/06/2018   Deviated nasal septum 12/03/2017   Mixed hyperlipidemia 11/29/2017   Gastroesophageal reflux disease 10/06/2017   Chronic hepatitis C without hepatic coma ([place]) 06/04/2016   Substance induced mood disorder ([place]) 09/20/2013   Benzodiazepine dependence ([place]) 09/21/2011   Bipolar 1 disorder, mixed, moderate ([place]) 09/21/2011   PTSD (post-traumatic stress disorder) 09/21/2011     Referrals to Alternative Service(s): Referred to Alternative Service(s):   Place:   Date:   Time:    Referred to Alternative Service(s):   Place:   Date:   Time:    Referred to Alternative Service(s):   Place:   Date:   Time:    Referred to Alternative Service(s):   Place:   Date:   Time:     Dannielle Burn, LMFT

## 2021-04-06 NOTE — ED Provider Notes (Signed)
Behavioral Health Admission H&P [facility] OBS)  Date: 04/06/21 Patient Name: Kristina Huffman MRN: [id number] Chief Complaint:  Chief Complaint  Patient presents with   Suicidal   Alcohol Problem      Diagnoses:  Final diagnoses:  Bipolar affective disorder, currently depressed, moderate ([place])  Alcohol dependence with uncomplicated intoxication ([place])    HPI: Kristina Huffman is a 60y/o female with psychiatric history of Bipolar disorder, anxiety, suicidal ideation, and alcohol abuse. Patient presented tearful, irritable and appears to be intoxicated. She is unable to state why she is here but reports that she would like to be admitted to a psychiatric hospital.   Patient is assessed upon arrival to [facility] by this NP. Patient is alert and oriented to self. She gets easily irritated and yells at [place] when ask assessment questions. Her mood is depressed and irritable with congruent affect. She denies medical complaint expect for nausea. She denies chest pain, sob, fever, abd pain/GU complaint.   She reports that she has been drinking to cope with briefing the loss of several family members including her son and her mother. She reports that she has been unable to sleep in the past 2-3 days despite taking her Trazodone as prescribed. She repeatedly states "I need help and I need to lay down and try to get some sleep."   Patient endorses suicidal ideations however she is unable to state plan or intent. She denies homicidal ideation; when ask about hallucination she does not provide an answer. She denies paranoia. She endorses drinking 1 bottle of wine prior to presentation. She reports that she consumes alcoholic beverages on a daily basis however she was unable to confirm quantity. She reports that she "gets sick" if she abstains from alcoholic consumption. She is unable to identify specific symptoms that she experiences in times of alcohol withdraw. She denies a history of alcohol withdraw seizures or  DT. She denies illicit substance abuse. She reports that she lives at home alone and has no family in the area.   [place] 2-9:  [employer] from 11/12/2020 in [facility] [employer] [facility] Office Visit from 03/18/2017 in [place]  Thoughts that you would be better off dead, or of hurting yourself in some way Not at all Not at all  PHQ-9 Total Score 5 18       [place] ED from 04/06/2021 in [facility] from 10/14/2020 in [facility] ED from 09/23/2020 in [place] Error: Question 1 not populated No Risk Moderate Risk        Total Time spent with patient: 30 minutes  Musculoskeletal  Strength & Muscle Tone: within normal limits Gait & Station: normal Patient leans: Right  Psychiatric Specialty Exam  Presentation General Appearance: Disheveled  Eye Contact:Good  Speech:Clear and Coherent; Normal Rate  Speech Volume:Normal  Handedness:Right   Mood and Affect  Mood:Depressed  Affect:Congruent   Thought Process  Thought Processes:Coherent  Descriptions of Associations:Intact  Orientation:Full (Time, Place and Person)  Thought Content:Logical    Hallucinations:Hallucinations: None  Ideas of Reference:None  Suicidal Thoughts:Suicidal Thoughts: No SI Active Intent and/or Plan: Without Intent; Without Plan; With Means to [place]  Homicidal Thoughts:Homicidal Thoughts: No   Sensorium  Memory:Immediate Good; Immediate Poor; Remote Good  Judgment:Fair  Insight:Good   Executive Functions  Concentration:Good  Attention Span:Good  [place]  Language:Good   Psychomotor Activity  Psychomotor Activity:Psychomotor Activity: Normal   Assets  Assets:Communication Skills; Desire for Improvement; Financial Resources/Insurance; Housing; Physical  Health   Sleep  Sleep:Sleep: Fair Number of Hours of Sleep: 5   Nutritional Assessment (For OBS and FBC admissions only) Has the patient had a weight loss or gain of 10 pounds or more in the last 3 months?: No Has the patient had a decrease in food intake/or appetite?: No Does the patient have dental problems?: No Does the patient have eating habits or behaviors that may be indicators of an eating disorder including binging or inducing vomiting?: No Has the patient recently lost weight without trying?: No Has the patient been eating poorly because of a decreased appetite?: No Malnutrition Screening Tool Score: 0   Physical Exam Vitals and nursing note reviewed.  Constitutional:      General: She is not in acute distress.    Appearance: She is well-developed.  HENT:     Head: Normocephalic and atraumatic.  Eyes:     Conjunctiva/sclera: Conjunctivae normal.  Cardiovascular:     Rate and Rhythm: Normal rate.     Heart sounds: No murmur heard. Pulmonary:     Effort: Pulmonary effort is normal. No respiratory distress.     Breath sounds: Normal breath sounds.  Abdominal:     Palpations: Abdomen is soft.     Tenderness: There is no abdominal tenderness.  Musculoskeletal:     Cervical back: Neck supple.  Skin:    General: Skin is warm and dry.  Neurological:     Mental Status: She is alert. She is disoriented.     Comments: Oriented to self; able to provide answers to some assessment questions and provided medical history  Psychiatric:        Attention and Perception: Attention and perception normal.        Mood and Affect: Mood is depressed. Affect is angry and tearful.        Speech: Speech normal.        Behavior: Behavior is agitated and aggressive.        Thought Content: Thought content is not paranoid or delusional. Thought content includes suicidal ideation. Thought content does not include homicidal ideation. Thought content does not include homicidal or suicidal  plan.        Cognition and Memory: Cognition is impaired.   Review of Systems  Constitutional: Negative.   HENT: Negative.    Eyes: Negative.   Respiratory: Negative.    Cardiovascular: Negative.   Gastrointestinal:  Positive for nausea and vomiting.  Genitourinary: Negative.   Musculoskeletal: Negative.   Skin: Negative.   Neurological: Negative.   Endo/Heme/Allergies: Negative.   Psychiatric/Behavioral:  Positive for depression, substance abuse and suicidal ideas. The patient has insomnia.    Blood pressure 127/82, pulse 87, temperature 97.8 F (36.6 C), temperature source Oral, resp. rate 16, last menstrual period 08/02/2013, SpO2 95 %. There is no height or weight on file to calculate BMI.  Past Psychiatric History:    Is the patient at risk to self? Yes  Has the patient been a risk to self in the past 6 months? Yes .    Has the patient been a risk to self within the distant past? Yes   Is the patient a risk to others? No   Has the patient been a risk to others in the past 6 months? No   Has the patient been a risk to others within the distant  past? No   Past Medical History:  Past Medical History:  Diagnosis Date   Aneurysm of splenic artery (HCC)    Anxiety    Asthma    Bipolar disorder ([place])    Depression    Diabetes mellitus without complication (HCC)    Diabetic peripheral neuropathy (HCC)    Elevated LFTs    ETOH abuse    GERD (gastroesophageal reflux disease)    Hepatitis C    Heroin abuse ([place])    History of MRSA infection    legs and spread to face   Hypertension    Insomnia    Mixed hyperlipidemia    OSA (obstructive sleep apnea)    Sleep apnea     Past Surgical History:  Procedure Laterality Date   BACK SURGERY     RADIAL HEAD ARTHROPLASTY  08/09/2012   Procedure: RADIAL HEAD ARTHROPLASTY;  Surgeon: Schuyler Amor, MD;  Location: [facility];  Service: Orthopedics;  Laterality: Left;  Left Radial head Replacement   WISDOM TOOTH EXTRACTION       Family History:  Family History  Problem Relation Age of Onset   Depression Mother    Osteoporosis Mother    COPD Mother    Rheum arthritis Mother    Diabetes Father    Hypertension Father    Congestive Heart Failure Father    Aneurysm Father    Clotting disorder Father    Heart disease Father    Cancer Maternal Grandfather    Colon cancer Neg Hx    Esophageal cancer Neg Hx    Rectal cancer Neg Hx    Stomach cancer Neg Hx     Social History:  Social History   Socioeconomic History   Marital status: Married    Spouse name: Not on file   Number of children: 4   Years of education: Not on file   Highest education level: Not on file  Occupational History   Not on file  Tobacco Use   Smoking status: Never   Smokeless tobacco: Never  Vaping Use   Vaping Use: Never used  Substance and Sexual Activity   Alcohol use: Not Currently   Drug use: Not Currently    Types: Heroin    Comment: no drug use for 3 years   Sexual activity: Yes    Birth control/protection: None  Other Topics Concern   Not on file  Social History Narrative   Not on file   Social Determinants of Health   Financial Resource Strain: Not on file  Food Insecurity: Food Insecurity Present   Worried About [place] in the Last Year: Sometimes true   Ran Out of Food in the Last Year: Sometimes true  Transportation Needs: No Transportation Needs   Lack of Transportation (Medical): No   Lack of Transportation (Non-Medical): No  Physical Activity: Not on file  Stress: Not on file  Social Connections: Not on file  Intimate Partner Violence: Not on file    SDOH:  SDOH Screenings   Alcohol Screen: Not on file  Depression (PHQ2-9): Medium Risk   PHQ-2 Score: 5  Financial Resource Strain: Not on file  Food Insecurity: Food Insecurity Present   Worried About Charity fundraiser in the Last Year: Sometimes true   Ran Out of Food in the Last Year: Sometimes true  Housing: Not on file   Physical Activity: Not on file  Social Connections: Not on file  Stress: Not on file  Tobacco Use:  Low Risk    Smoking Tobacco Use: Never   Smokeless Tobacco Use: Never  Transportation Needs: No Transportation Needs   Lack of Transportation (Medical): No   Lack of Transportation (Non-Medical): No    Last Labs:  Admission on 04/06/2021, Discharged on 04/06/2021  Component Date Value Ref Range Status   SARS Coronavirus 2 by RT PCR 04/06/2021 NEGATIVE  NEGATIVE Final   Comment: (NOTE) SARS-CoV-2 target nucleic acids are NOT DETECTED.  The SARS-CoV-2 RNA is generally detectable in upper respiratory specimens during the acute phase of infection. The lowest concentration of SARS-CoV-2 viral copies this assay can detect is 138 copies/mL. A negative result does not preclude SARS-Cov-2 infection and should not be used as the sole basis for treatment or other patient management decisions. A negative result may occur with  improper specimen collection/handling, submission of specimen other than nasopharyngeal swab, presence of viral mutation(s) within the areas targeted by this assay, and inadequate number of viral copies(<138 copies/mL). A negative result must be combined with clinical observations, patient history, and epidemiological information. The expected result is Negative.  Fact Sheet for Patients:  EntrepreneurPulse.com.au  Fact Sheet for Healthcare Providers:  IncredibleEmployment.be  This test is no                          t yet approved or cleared by the [place] FDA and  has been authorized for detection and/or diagnosis of SARS-CoV-2 by FDA under an Emergency Use Authorization (EUA). This EUA will remain  in effect (meaning this test can be used) for the duration of the COVID-19 declaration under Section 564(b)(1) of the Act, 21 U.S.C.section 360bbb-3(b)(1), unless the authorization is terminated  or revoked sooner.        Influenza A by PCR 04/06/2021 NEGATIVE  NEGATIVE Final   Influenza B by PCR 04/06/2021 NEGATIVE  NEGATIVE Final   Comment: (NOTE) The Xpert Xpress SARS-CoV-2/FLU/RSV plus assay is intended as an aid in the diagnosis of influenza from Nasopharyngeal swab specimens and should not be used as a sole basis for treatment. Nasal washings and aspirates are unacceptable for Xpert Xpress SARS-CoV-2/FLU/RSV testing.  Fact Sheet for Patients: EntrepreneurPulse.com.au  Fact Sheet for Healthcare Providers: IncredibleEmployment.be  This test is not yet approved or cleared by the [place] FDA and has been authorized for detection and/or diagnosis of SARS-CoV-2 by FDA under an Emergency Use Authorization (EUA). This EUA will remain in effect (meaning this test can be used) for the duration of the COVID-19 declaration under Section 564(b)(1) of the Act, 21 U.S.C. section 360bbb-3(b)(1), unless the authorization is terminated or revoked.  Performed at [facility], [place] [address], [place], [place] [address]    WBC 04/06/2021 5.7  4.0 - 10.5 K/uL Final   RBC 04/06/2021 4.86  3.87 - 5.11 MIL/uL Final   Hemoglobin 04/06/2021 15.2 (A) 12.0 - 15.0 g/dL Final   HCT 04/06/2021 44.4  36.0 - 46.0 % Final   MCV 04/06/2021 91.4  80.0 - 100.0 fL Final   MCH 04/06/2021 31.3  26.0 - 34.0 pg Final   MCHC 04/06/2021 34.2  30.0 - 36.0 g/dL Final   RDW 04/06/2021 12.8  11.5 - 15.5 % Final   Platelets 04/06/2021 275  150 - 400 K/uL Final   nRBC 04/06/2021 0.0  0.0 - 0.2 % Final   Neutrophils Relative % 04/06/2021 36  % Final   Neutro Abs 04/06/2021 2.1  1.7 - 7.7 K/uL Final  Lymphocytes Relative 04/06/2021 52  % Final   Lymphs Abs 04/06/2021 3.0  0.7 - 4.0 K/uL Final   Monocytes Relative 04/06/2021 9  % Final   Monocytes Absolute 04/06/2021 0.5  0.1 - 1.0 K/uL Final   Eosinophils Relative 04/06/2021 2  % Final   Eosinophils Absolute 04/06/2021 0.1  0.0 - 0.5 K/uL  Final   Basophils Relative 04/06/2021 1  % Final   Basophils Absolute 04/06/2021 0.1  0.0 - 0.1 K/uL Final   Immature Granulocytes 04/06/2021 0  % Final   Abs Immature Granulocytes 04/06/2021 0.01  0.00 - 0.07 K/uL Final   Performed at [facility] [address], [place], [place] [address]   Alcohol, Ethyl (B) 04/06/2021 270 (A) <10 mg/dL Final   Comment: (NOTE) Lowest detectable limit for serum alcohol is 10 mg/dL.  For medical purposes only. Performed at [facility], [place] [address] [place], [place] [address]    TSH 04/06/2021 6.879 (A) 0.350 - 4.500 uIU/mL Final   Comment: Performed by a 3rd Generation assay with a functional sensitivity of <=0.01 uIU/mL. Performed at [facility], [place] [address] [place], [place] [address]    Cholesterol 04/06/2021 170  0 - 200 mg/dL Final   Triglycerides 04/06/2021 230 (A) <150 mg/dL Final   HDL 04/06/2021 49  >40 mg/dL Final   Total CHOL/HDL Ratio 04/06/2021 3.5  RATIO Final   VLDL 04/06/2021 46 (A) 0 - 40 mg/dL Final   LDL Cholesterol 04/06/2021 75  0 - 99 mg/dL Final   Comment:        Total Cholesterol/HDL:CHD Risk Coronary Heart Disease Risk Table                     Men   Women  1/2 Average Risk   3.4   3.3  Average Risk       5.0   4.4  2 X Average Risk   9.6   7.1  3 X Average Risk  23.4   11.0        Use the calculated Patient Ratio above and the CHD Risk Table to determine the patient's CHD Risk.        ATP III CLASSIFICATION (LDL):  <100     mg/dL   Optimal  100-129  mg/dL   Near or Above                    Optimal  130-159  mg/dL   Borderline  160-189  mg/dL   High  >190     mg/dL   Very High Performed at [facility] [address] [place], [place] [address]    Hgb A1c MFr Bld 04/06/2021 6.0 (A) 4.8 - 5.6 % Final   Comment: (NOTE) Pre diabetes:          5.7%-6.4%  Diabetes:              >6.4%  Glycemic control for   <7.0% adults with diabetes    Mean Plasma Glucose 04/06/2021 125.5   mg/dL Final   Performed at [facility] [address] [place], [place] [address]   SARSCOV2ONAVIRUS 2 AG 04/06/2021 NEGATIVE  NEGATIVE Final   Comment: (NOTE) SARS-CoV-2 antigen NOT DETECTED.   Negative results are presumptive.  Negative results do not preclude SARS-CoV-2 infection and should not be used as the sole basis for treatment or other patient management decisions, including infection  control  decisions, particularly in the presence of clinical signs and  symptoms consistent with COVID-19, or in those who have been in contact with the virus.  Negative results must be combined with clinical observations, patient history, and epidemiological information. The expected result is Negative.  Fact Sheet for Patients: HandmadeRecipes.com.cy  Fact Sheet for Healthcare Providers: FuneralLife.at  This test is not yet approved or cleared by the [place] FDA and  has been authorized for detection and/or diagnosis of SARS-CoV-2 by FDA under an Emergency Use Authorization (EUA).  This EUA will remain in effect (meaning this test can be used) for the duration of  the COV                          ID-19 declaration under Section 564(b)(1) of the Act, 21 U.S.C. section 360bbb-3(b)(1), unless the authorization is terminated or revoked sooner.     Sodium 04/06/2021 134 (A) 135 - 145 mmol/L Final   Potassium 04/06/2021 3.5  3.5 - 5.1 mmol/L Final   Chloride 04/06/2021 100  98 - 111 mmol/L Final   CO2 04/06/2021 24  22 - 32 mmol/L Final   Glucose, Bld 04/06/2021 113 (A) 70 - 99 mg/dL Final   Glucose reference range applies only to samples taken after fasting for at least 8 hours.   BUN 04/06/2021 15  6 - 20 mg/dL Final   Creatinine, Ser 04/06/2021 0.71  0.44 - 1.00 mg/dL Final   Calcium 04/06/2021 9.5  8.9 - 10.3 mg/dL Final   Total Protein 04/06/2021 7.0  6.5 - 8.1 g/dL Final   Albumin 04/06/2021 4.2  3.5 - 5.0 g/dL Final   AST  04/06/2021 40  15 - 41 U/L Final   ALT 04/06/2021 52 (A) 0 - 44 U/L Final   Alkaline Phosphatase 04/06/2021 94  38 - 126 U/L Final   Total Bilirubin 04/06/2021 1.2  0.3 - 1.2 mg/dL Final   GFR, Estimated 04/06/2021 >60  >60 mL/min Final   Comment: (NOTE) Calculated using the CKD-EPI Creatinine Equation (2021)    Anion gap 04/06/2021 10  5 - 15 Final   Performed at [facility] [address] [place], [place] [address]  Admission on 10/14/2020, Discharged on 10/14/2020  Component Date Value Ref Range Status   Neisseria Gonorrhea 10/14/2020 Negative   Final   Chlamydia 10/14/2020 Negative   Final   Trichomonas 10/14/2020 Negative   Final   Bacterial Vaginitis (gardnerella) 10/14/2020 Positive (A)  Final   Candida Vaginitis 10/14/2020 Negative   Final   Candida Glabrata 10/14/2020 Negative   Final   Comment 10/14/2020 Normal Reference Range Candida Species - Negative   Final   Comment 10/14/2020 Normal Reference Range Candida Galbrata - Negative   Final   Comment 10/14/2020 Normal Reference Range Trichomonas - Negative   Final   Comment 10/14/2020 Normal Reference [place] Chlamydia - Negative   Final   Comment 10/14/2020 Normal Reference Range Neisseria Gonorrhea - Negative   Final   Comment 10/14/2020 Normal Reference Range Bacterial Vaginosis - Negative   Final    Allergies: Sulfa antibiotics and Aspirin  PTA Medications: (Not in a hospital admission)   Medical Decision Making  Patient is unable to contract for safety and appears to be intoxicated. Patient will be admitted to [facility] for continuous assessment as well as place on CIWA protocol for alocohol withdraw management. -Ciwa protocol -obtain labs     Recommendations  Based on my evaluation the  patient does not appear to have an emergency medical condition.  Ophelia Shoulder, NP 04/06/21  7:52 PM

## 2021-04-12 ENCOUNTER — Telehealth: Payer: Self-pay

## 2021-04-12 NOTE — Telephone Encounter (Signed)
Attempt made to contact Kristina Huffman is a 60 y.o. female re: New pt Pre appt call to collect history information.  -Allergy -Medication -Confirm pharmacy -OB history   Pt was not available. LM on the VM for the patient to call back

## 2021-04-16 ENCOUNTER — Ambulatory Visit: Payer: Medicaid Other | Admitting: Obstetrics and Gynecology

## 2021-04-16 NOTE — Progress Notes (Deleted)
[facility] New Patient Evaluation and Consultation  Referring Provider: Woodroe Mode, MD PCP: Audley Hose, MD Date of Service: 04/16/2021  SUBJECTIVE Chief Complaint: No chief complaint on file.  History of Present Illness: Kristina Huffman is a 60 y.o. White or Caucasian female seen in consultation at the request of Dr. Roselie Awkward for evaluation of pelvic pain.    Review of records from Dr Roselie Awkward significant for: Pelvic pain, no evidence of prolapse on exam. Has chronic constipation.   Urinary Symptoms: {urine leakage?:24754} Leaks *** time(s) per {days/wks/mos/yrs:310907}.  Pad use: {NUMBERS 1-10:18281} {pad option:24752} per day.   She {ACTION; IS/IS VG:4697475 bothered by her UI symptoms.  Day time voids ***.  Nocturia: *** times per night to void. Voiding dysfunction: she {empties:24755} her bladder well.  {DOES NOT does:27190} use a catheter to empty bladder.  When urinating, she feels {urine symptoms:24756} Drinks: *** per day  UTIs: {NUMBERS 1-10:18281} UTI's in the last year.   {ACTIONS;DENIES/REPORTS:21021675} history of {urologic concerns:24757}  Pelvic Organ Prolapse Symptoms:                  She {denies/ admits to:24761} a feeling of a bulge the vaginal area. It has been present for {NUMBER 1-10:22536} {days/wks/mos/yrs:310907}.  She {denies/ admits to:24761} seeing a bulge.  This bulge {ACTION; IS/IS VG:4697475 bothersome.  Bowel Symptom: Bowel movements: *** time(s) per {Time; day/week/month:13537} Stool consistency: {stool consistency:24758} Straining: {yes/no:19897}.  Splinting: {yes/no:19897}.  Incomplete evacuation: {yes/no:19897}.  She {denies/ admits to:24761} accidental bowel leakage / fecal incontinence  Occurs: *** time(s) per {Time; day/week/month:13537}  Consistency with leakage: {stool consistency:24758} Bowel regimen: {bowel regimen:24759} Last colonoscopy: Date ***, Results ***  Sexual Function Sexually active:  {yes/no:19897}.  Sexual orientation: {Sexual Orientation:[phone]} Pain with sex: {pain with sex:24762}  Pelvic Pain {denies/ admits to:24761} pelvic pain Location: *** Pain occurs: *** Prior pain treatment: *** Improved by: *** Worsened by: ***   Past Medical History:  Past Medical History:  Diagnosis Date   Aneurysm of splenic artery (HCC)    Anxiety    Asthma    Bipolar disorder ([place])    Depression    Diabetes mellitus without complication (HCC)    Diabetic peripheral neuropathy (HCC)    Elevated LFTs    ETOH abuse    GERD (gastroesophageal reflux disease)    Hepatitis C    Heroin abuse ([place])    History of MRSA infection    legs and spread to face   Hypertension    Insomnia    Mixed hyperlipidemia    OSA (obstructive sleep apnea)    Sleep apnea      Past Surgical History:   Past Surgical History:  Procedure Laterality Date   BACK SURGERY     RADIAL HEAD ARTHROPLASTY  08/09/2012   Procedure: RADIAL HEAD ARTHROPLASTY;  Surgeon: Schuyler Amor, MD;  Location: [place];  Service: Orthopedics;  Laterality: Left;  Left Radial head Replacement   WISDOM TOOTH EXTRACTION       Past OB/GYN History: G{NUMBERS 1-10:18281} P{NUMBERS 1-10:18281} Vaginal deliveries: ***,  Forceps/ Vacuum deliveries: ***, Cesarean section: *** Menopausal: {menopausal:24763} Contraception: ***. Last pap smear was ***.  Any history of abnormal pap smears: {yes/no:19897}.   Medications: She has a current medication list which includes the following prescription(s): allopurinol, atorvastatin, b complex vitamins, fluoxetine, gabapentin, hydrochlorothiazide, ibuprofen, naltrexone, prazosin, quetiapine, quetiapine, trazodone, and [DISCONTINUED] metformin.   Allergies: Patient is allergic to sulfa antibiotics and aspirin.   Social History:  Social History  Tobacco Use   Smoking status: Never   Smokeless tobacco: Never  Vaping Use   Vaping Use: Never used  Substance Use Topics    Alcohol use: Not Currently   Drug use: Not Currently    Types: Heroin    Comment: no drug use for 3 years    Relationship status: {relationship status:24764} She lives with ***.   She {ACTION; IS/IS GI:087931 employed ***. Regular exercise: {Yes/No:304960894} History of abuse: {Yes/No:304960894}  Family History:   Family History  Problem Relation Age of Onset   Depression Mother    Osteoporosis Mother    COPD Mother    Rheum arthritis Mother    Diabetes Father    Hypertension Father    Congestive Heart Failure Father    Aneurysm Father    Clotting disorder Father    Heart disease Father    Cancer Maternal Grandfather    Colon cancer Neg Hx    Esophageal cancer Neg Hx    Rectal cancer Neg Hx    Stomach cancer Neg Hx      Review of Systems: ROS   OBJECTIVE Physical Exam: There were no vitals filed for this visit.  Physical Exam   GU / Detailed Urogynecologic Evaluation:  Pelvic Exam: Normal external female genitalia; Bartholin's and Skene's glands normal in appearance; urethral meatus normal in appearance, no urethral masses or discharge.   CST: {gen negative/positive:315881}  Reflexes: bulbocavernosis {DESC; PRESENT/NOT PRESENT:21021351}, anocutaneous {DESC; PRESENT/NOT PRESENT:21021351} ***bilaterally.  Speculum exam reveals normal vaginal mucosa {With/Without:20273} atrophy. Cervix {exam; gyn cervix:30847}. Uterus {exam; pelvic uterus:30849}. Adnexa {exam; adnexa:12223}.    s/p hysterectomy: Speculum exam reveals normal vaginal mucosa {With/Without:20273}  atrophy and normal vaginal cuff.  Adnexa {exam; adnexa:12223}.    With apex supported, anterior compartment defect was {reduced:24765}  Pelvic floor strength {Roman # I-V:19040}/V, puborectalis {Roman # I-V:19040}/V external anal sphincter {Roman # I-V:19040}/V  Pelvic floor musculature: Right levator {Tender/Non-tender:20250}, Right obturator {Tender/Non-tender:20250}, Left levator  {Tender/Non-tender:20250}, Left obturator {Tender/Non-tender:20250}  POP-Q:   POP-Q                                               Aa                                               Ba                                                 C                                                Gh                                               Pb  tvl                                                Ap                                               Bp                                                 D     Rectal Exam:  Normal sphincter tone, {rectocele:24766} distal rectocele, enterocoele {DESC; PRESENT/NOT PRESENT:21021351}, no rectal masses, {sign of:24767} dyssynergia when asking the patient to bear down.  Post-Void Residual (PVR) by Bladder Scan: In order to evaluate bladder emptying, we discussed obtaining a postvoid residual and she agreed to this procedure.  Procedure: The ultrasound unit was placed on the patient's abdomen in the suprapubic region after the patient had voided. A PVR of *** ml was obtained by bladder scan.  Laboratory Results: '@ENCLABS'$ @   ***I visualized the urine specimen, noting the specimen to be {urine color:24768}  ASSESSMENT AND PLAN Ms. Clouthier is a 60 y.o. with: No diagnosis found.    Jaquita Folds, MD   Medical Decision Making:  - Reviewed/ ordered a clinical laboratory test - Reviewed/ ordered a radiologic study - Reviewed/ ordered medicine test - Decision to obtain old records - Discussion of management of or test interpretation with an external physician / other healthcare professional  - Assessment requiring independent historian - Review and summation of prior records - Independent review of image, tracing or specimen

## 2021-05-22 ENCOUNTER — Other Ambulatory Visit: Payer: Self-pay | Admitting: Internal Medicine

## 2021-05-22 DIAGNOSIS — K760 Fatty (change of) liver, not elsewhere classified: Secondary | ICD-10-CM

## 2021-05-28 ENCOUNTER — Ambulatory Visit (HOSPITAL_COMMUNITY)
Admission: EM | Admit: 2021-05-28 | Discharge: 2021-05-29 | Disposition: A | Discharge status: Home / Self Care | Payer: Medicaid Other | Attending: Nurse Practitioner | Admitting: Nurse Practitioner

## 2021-05-28 ENCOUNTER — Other Ambulatory Visit: Payer: Self-pay

## 2021-05-28 DIAGNOSIS — F4310 Post-traumatic stress disorder, unspecified: Secondary | ICD-10-CM | POA: Insufficient documentation

## 2021-05-28 DIAGNOSIS — F3162 Bipolar disorder, current episode mixed, moderate: Secondary | ICD-10-CM | POA: Insufficient documentation

## 2021-05-28 DIAGNOSIS — R45851 Suicidal ideations: Secondary | ICD-10-CM | POA: Insufficient documentation

## 2021-05-28 DIAGNOSIS — Z634 Disappearance and death of family member: Secondary | ICD-10-CM | POA: Insufficient documentation

## 2021-05-28 DIAGNOSIS — Z79899 Other long term (current) drug therapy: Secondary | ICD-10-CM | POA: Insufficient documentation

## 2021-05-28 MED ORDER — MAGNESIUM HYDROXIDE 400 MG/5ML PO SUSP: 30.0000 mL | Freq: Every day | ORAL | Status: DC | PRN

## 2021-05-28 MED ORDER — ALUM & MAG HYDROXIDE-SIMETH 200-200-20 MG/5ML PO SUSP: 30.0000 mL | ORAL | Status: DC | PRN

## 2021-05-28 MED ORDER — LORAZEPAM 1 MG PO TABS
1.0000 mg | ORAL_TABLET | Freq: Once | ORAL | Status: AC
  Administered 2021-05-28: 1 mg via ORAL
  Filled 2021-05-28: qty 1

## 2021-05-28 MED ORDER — ACETAMINOPHEN 325 MG PO TABS: 650.0000 mg | ORAL_TABLET | Freq: Four times a day (QID) | ORAL | Status: DC | PRN

## 2021-05-28 NOTE — Discharge Instructions (Addendum)
  Discharge recommendations:  Patient is to take medications as prescribed. Please see information for follow-up appointment with psychiatry and therapy. Please follow up with your primary care provider for all medical related needs.   Therapy: We recommend that patient participate in individual therapy to address mental health concerns.  Medications: The patient is to contact a medical professional and/or outpatient provider to address any new side effects that develop. Patient should update outpatient providers of any new medications and/or medication changes.   Atypical antipsychotics: If you are prescribed an atypical antipsychotic, it is recommended that your height, weight, BMI, blood pressure, fasting lipid panel, and fasting blood sugar be monitored by your outpatient providers.  Safety:  The patient should abstain from use of illicit substances/drugs and abuse of any medications. If symptoms worsen or do not continue to improve or if the patient becomes actively suicidal or homicidal then it is recommended that the patient return to the closest hospital emergency department, the [facility], or call 911 for further evaluation and treatment. National Suicide Prevention Lifeline 1-800-SUICIDE or (475) 517-4666.
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
## 2021-05-30 ENCOUNTER — Ambulatory Visit
Admission: RE | Admit: 2021-05-30 | Discharge: 2021-05-30 | Disposition: A | Discharge status: Home / Self Care | Payer: Medicaid Other | Source: Ambulatory Visit | Attending: Internal Medicine | Admitting: Internal Medicine

## 2021-05-30 DIAGNOSIS — K760 Fatty (change of) liver, not elsewhere classified: Secondary | ICD-10-CM

## 2021-07-11 ENCOUNTER — Ambulatory Visit: Payer: Medicaid Other | Admitting: Gastroenterology

## 2021-07-16 ENCOUNTER — Telehealth: Payer: Self-pay | Admitting: Obstetrics & Gynecology

## 2021-07-16 ENCOUNTER — Telehealth: Payer: Self-pay

## 2021-07-16 NOTE — Telephone Encounter (Signed)
Pt called & spoke with front desk of vaginal lump that she has & still having a lot of discharge from it. Pt already has an appointment with Dr. Roselie Awkward on 08/02/21. Pt states is already doing warm compressions, wanted to know if there is anything else that she can try. Spoke with Louisa Second, RN & she suggested Pt to try a warm [place] Salt bath along with Ibuprofen. Pt verbalized understanding.

## 2021-07-16 NOTE — Telephone Encounter (Signed)
Patient want to speak with a nurse about a vaginal lump

## 2021-07-24 ENCOUNTER — Other Ambulatory Visit: Payer: Self-pay | Admitting: Internal Medicine

## 2021-07-24 DIAGNOSIS — Z1231 Encounter for screening mammogram for malignant neoplasm of breast: Secondary | ICD-10-CM

## 2021-08-02 ENCOUNTER — Ambulatory Visit: Payer: Medicaid Other | Admitting: Obstetrics & Gynecology

## 2021-08-27 ENCOUNTER — Ambulatory Visit: Payer: Medicaid Other | Admitting: Gastroenterology

## 2021-08-27 ENCOUNTER — Telehealth: Payer: Self-pay | Admitting: Gastroenterology

## 2021-08-27 NOTE — Telephone Encounter (Signed)
Good Afternoon Dr. Fuller Plan,  Patient called and stated that she would not be able to come in for her appointment today with no reason given.   We rescheduled for 1/16 at 10:50.

## 2021-08-29 ENCOUNTER — Ambulatory Visit: Payer: Medicaid Other

## 2021-09-04 ENCOUNTER — Encounter: Payer: Self-pay | Admitting: Obstetrics and Gynecology

## 2021-09-04 ENCOUNTER — Ambulatory Visit: Payer: Medicaid Other | Admitting: Obstetrics and Gynecology

## 2021-09-05 NOTE — Progress Notes (Signed)
Patient did not keep her GYN appointment for 09/04/2021.  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

## 2021-09-11 ENCOUNTER — Other Ambulatory Visit: Payer: Self-pay | Admitting: Internal Medicine

## 2021-09-11 DIAGNOSIS — K7689 Other specified diseases of liver: Secondary | ICD-10-CM

## 2021-09-24 ENCOUNTER — Other Ambulatory Visit: Payer: Medicaid Other

## 2021-09-30 ENCOUNTER — Other Ambulatory Visit: Payer: Self-pay | Admitting: Internal Medicine

## 2021-09-30 DIAGNOSIS — Z1231 Encounter for screening mammogram for malignant neoplasm of breast: Secondary | ICD-10-CM

## 2021-10-07 ENCOUNTER — Encounter: Payer: Self-pay | Admitting: Gastroenterology

## 2021-10-07 ENCOUNTER — Ambulatory Visit (INDEPENDENT_AMBULATORY_CARE_PROVIDER_SITE_OTHER): Payer: Medicaid Other | Admitting: Gastroenterology

## 2021-10-07 ENCOUNTER — Other Ambulatory Visit (INDEPENDENT_AMBULATORY_CARE_PROVIDER_SITE_OTHER): Payer: Medicaid Other

## 2021-10-07 VITALS — BP 128/80 | HR 80 | Ht 65.5 in | Wt 216.4 lb

## 2021-10-07 DIAGNOSIS — R932 Abnormal findings on diagnostic imaging of liver and biliary tract: Secondary | ICD-10-CM

## 2021-10-07 DIAGNOSIS — R1013 Epigastric pain: Secondary | ICD-10-CM

## 2021-10-07 DIAGNOSIS — K760 Fatty (change of) liver, not elsewhere classified: Secondary | ICD-10-CM | POA: Diagnosis not present

## 2021-10-07 LAB — PROTIME-INR
INR: 1 ratio (ref 0.8–1.0)
Prothrombin Time: 10.7 s (ref 9.6–13.1)

## 2021-10-07 LAB — HEPATIC FUNCTION PANEL
ALT: 42 U/L — ABNORMAL HIGH (ref 0–35)
AST: 41 U/L — ABNORMAL HIGH (ref 0–37)
Albumin: 4.6 g/dL (ref 3.5–5.2)
Alkaline Phosphatase: 104 U/L (ref 39–117)
Bilirubin, Direct: 0.2 mg/dL (ref 0.0–0.3)
Total Bilirubin: 1.1 mg/dL (ref 0.2–1.2)
Total Protein: 7.5 g/dL (ref 6.0–8.3)

## 2021-10-07 MED ORDER — PANTOPRAZOLE SODIUM 40 MG PO TBEC: 40.0000 mg | DELAYED_RELEASE_TABLET | Freq: Every day | ORAL | 11 refills | Status: DC

## 2021-10-07 NOTE — Progress Notes (Signed)
° ° °  History of Present Illness: This is a 61 year old female referred by Audley Hose, MD for the evaluation of abnormal hepatic imaging.  Imaging studies below.  Most recent CT shows a 2.5 cm and a 5.5 cm cystic masses.  Patient relates that MR imaging was requested by her PCP however her insurance declined this.  Follow-up RUQ [place] is scheduled on January 18.  Patient relates epigastric pain, nausea and occasional substernal burning for the past several weeks.  She was previously treated for GERD and erosive gastritis with pantoprazole.   RUQ [place] 05/2021 1. Hepatic steatosis. 2. Revisualization of a 3.1 cm mass along the anterior margin of the LEFT liver, previously 6.5 cm in 2019. On today's exam, it is not definitively cystic in appearance. No internal blood flow is identified. This may reflect a complicated cyst or complicated biliary cystadenoma with internal proteinaceous debris. Recommend continued attention on subsequent abdominal imaging.  CTA AP 04/11/2020 There is a 2.5 cm cyst in the left hepatic lobe, stable from prior study. There is a 5.5 cm cystic mass in the left hepatic lobe which is essentially stable to decreased in size from the prior study. This cyst appears to demonstrate a thin septation but was previously described as benign on the patient's December 03, 2017 CT. The liver is enlarged. There is likely underlying hepatic steatosis. Normal gallbladder.There is no biliary ductal dilation.   Review of Systems: Pertinent positive and negative review of systems were noted in the above HPI section. All other review of systems were otherwise negative.   Physical Exam: General: Well developed, well nourished, no acute distress Head: Normocephalic and atraumatic Eyes: Sclerae anicteric, EOMI Ears: Normal auditory acuity Mouth: Not examined, mask on during Covid-19 pandemic Neck: Supple, no masses or thyromegaly Lungs: Clear throughout to auscultation Heart: Regular rate and  rhythm; no murmurs, rubs or bruits Abdomen: Soft, non tender and non distended. No masses, hepatosplenomegaly or hernias noted. Normal Bowel sounds Rectal: Not done Musculoskeletal: Symmetrical with no gross deformities  Skin: No lesions on visible extremities Pulses:  Normal pulses noted Extremities: No clubbing, cyanosis, edema or deformities noted Neurological: Alert oriented x 4, grossly nonfocal Cervical Nodes:  No significant cervical adenopathy Inguinal Nodes: No significant inguinal adenopathy Psychological:  Alert and cooperative. Normal mood and affect   Assessment and Recommendations:  Two hepatic lesions, 2.5 cm and 5.5 cm respectively, etiology unclear. Mildly elevated ALT. Hepatic steatosis. Repeat RUQ [place] on Jan 18 was scheduled by her PCP. Pt relates that an abd MRI was declined by her insurance.  LFTs, PT/INR today. Referral to [facility] for further evaluation.   History of Hep C, genotype Ia.  HCV RNA 1,572,400 in March 2017. Apparently Treated with Zepatier for 12 weeks by ID in the fall of 2017. HCV RNA negative in Dec 2017. Referral to [facility] for further evaluation.   Epigastric pain, nausea. History of GERD and erosive gastritis.  Follow antireflux measures.  Resume pantoprazole 40 mg p.o. daily for 2 months and then attempt to discontinue.  Resume if symptoms recur. Personal history of colon polyps in 2021: Tubular adenoma and sessile serrated polyp in 2021.  A 5-year interval surveillance colonoscopy is recommended in August 2026.   cc: Audley Hose, MD [place],  [place] [address]

## 2021-10-07 NOTE — Patient Instructions (Signed)
Your provider has requested that you go to the basement level for lab work before leaving today. Press "B" on the elevator. The lab is located at the first door on the left as you exit the elevator.   We have sent the following medications to your pharmacy for you to pick up at your convenience: pantoprazole.  Patient advised to avoid spicy, acidic, citrus, chocolate, mints, fruit and fruit juices.  Limit the intake of caffeine, alcohol and Soda.  Don't exercise too soon after eating.  Don't lie down within 3-4 hours of eating.  Elevate the head of your bed.  Please have your primary care physician refer you to Roosevelt Locks, NP at [facility]. We provided you with the information.   The  GI providers would like to encourage you to use [facility] to communicate with providers for non-urgent requests or questions.  Due to long hold times on the telephone, sending your provider a message by [facility] may be a faster and more efficient way to get a response.  Please allow 48 business hours for a response.  Please remember that this is for non-urgent requests.   Due to recent changes in healthcare laws, you may see the results of your imaging and laboratory studies on MyChart before your provider has had a chance to review them.  We understand that in some cases there may be results that are confusing or concerning to you. Not all laboratory results come back in the same time frame and the provider may be waiting for multiple results in order to interpret others.  Please give [place] 48 hours in order for your provider to thoroughly review all the results before contacting the office for clarification of your results.   Thank you for choosing me and [facility].  Pricilla Riffle. Dagoberto Ligas., MD., [facility]al

## 2021-10-08 ENCOUNTER — Telehealth: Payer: Self-pay | Admitting: Gastroenterology

## 2021-10-08 NOTE — Telephone Encounter (Signed)
Patient returned your call, requesting a call back. Please advise.

## 2021-10-08 NOTE — Telephone Encounter (Signed)
See results notes for details.  

## 2021-10-09 ENCOUNTER — Other Ambulatory Visit: Payer: Medicaid Other

## 2021-10-16 ENCOUNTER — Ambulatory Visit: Payer: Medicaid Other | Admitting: Podiatry

## 2021-10-16 ENCOUNTER — Ambulatory Visit (INDEPENDENT_AMBULATORY_CARE_PROVIDER_SITE_OTHER): Payer: Medicaid Other | Admitting: Primary Care

## 2021-10-17 ENCOUNTER — Ambulatory Visit
Admission: RE | Admit: 2021-10-17 | Discharge: 2021-10-17 | Disposition: A | Discharge status: Home / Self Care | Payer: Medicaid Other | Source: Ambulatory Visit | Attending: Internal Medicine | Admitting: Internal Medicine

## 2021-10-17 DIAGNOSIS — K7689 Other specified diseases of liver: Secondary | ICD-10-CM

## 2021-11-13 ENCOUNTER — Ambulatory Visit (HOSPITAL_COMMUNITY)
Admission: EM | Admit: 2021-11-13 | Discharge: 2021-11-13 | Disposition: A | Discharge status: Home / Self Care | Payer: Medicaid Other | Attending: Family Medicine | Admitting: Family Medicine

## 2021-11-13 ENCOUNTER — Other Ambulatory Visit: Payer: Self-pay

## 2021-11-13 ENCOUNTER — Encounter (HOSPITAL_COMMUNITY): Payer: Self-pay | Admitting: Emergency Medicine

## 2021-11-13 DIAGNOSIS — M79601 Pain in right arm: Secondary | ICD-10-CM | POA: Diagnosis not present

## 2021-11-13 MED ORDER — PREDNISONE 20 MG PO TABS: 40.0000 mg | ORAL_TABLET | Freq: Every day | ORAL | 0 refills | Status: DC

## 2021-11-13 NOTE — ED Triage Notes (Signed)
Pt reports right arm pain x 1 day. Pt states hx of gout and medications prescribed has not helped.

## 2021-11-13 NOTE — Discharge Instructions (Signed)
Stop colchicine for now. Follow up with your primary care provider about your gout/arm pain.

## 2021-11-13 NOTE — ED Provider Notes (Signed)
[place]    CSN: [id number] Arrival date & time: 11/13/21  1313      History   Chief Complaint Chief Complaint  Patient presents with   Arm Pain    HPI Kristina Huffman is a 61 y.o. female. Pt reports right arm pain x 3 days.  Pain started in right elbow and she thought it was her gout.  She started taking colchicine and then the pain in her elbow improved but now she has pain in her right forearm.  Pain is so significant that she is not able to use her right arm.  Denies injury to her right upper extremity   Arm Pain   Past Medical History:  Diagnosis Date   Aneurysm of splenic artery (HCC)    Anxiety    Asthma    Bipolar disorder ([place])    Depression    Diabetes mellitus without complication ([place])    Diabetic peripheral neuropathy (HCC)    Elevated LFTs    ETOH abuse    GERD (gastroesophageal reflux disease)    Hepatitis C    Heroin abuse ([place])    History of MRSA infection    legs and spread to face   Hypertension    Insomnia    Mixed hyperlipidemia    OSA (obstructive sleep apnea)    Sleep apnea     Patient Active Problem List   Diagnosis Date Noted   Obstructive sleep apnea syndrome 05/08/2020   Acute respiratory failure due to COVID-19 ([place]) 10/14/2019   ARF (acute renal failure) ([place]) 10/14/2019   Essential hypertension 10/14/2019   Controlled type 2 diabetes mellitus with hyperglycemia ([place]) 10/14/2019   Alcohol use disorder, severe, dependence ([place]) 06/06/2018   Deviated nasal septum 12/03/2017   Mixed hyperlipidemia 11/29/2017   Gastroesophageal reflux disease 10/06/2017   Chronic hepatitis C without hepatic coma ([place]) 06/04/2016   Substance induced mood disorder ([place]) 09/20/2013   Benzodiazepine dependence ([place]) 09/21/2011   Bipolar 1 disorder, mixed, moderate ([place]) 09/21/2011   PTSD (post-traumatic stress disorder) 09/21/2011    Past Surgical History:  Procedure Laterality Date   BACK SURGERY     RADIAL HEAD ARTHROPLASTY   08/09/2012   Procedure: RADIAL HEAD ARTHROPLASTY;  Surgeon: Schuyler Amor, MD;  Location: [facility];  Service: Orthopedics;  Laterality: Left;  Left Radial head Replacement   WISDOM TOOTH EXTRACTION      OB History     Gravida  8   Para  4   Term  4   Preterm      AB  4   Living  4      SAB  1   IAB  3   Ectopic      Multiple      Live Births  4            Home Medications    Prior to Admission medications   Medication Sig Start Date End Date Taking? Authorizing Provider  predniSONE (DELTASONE) 20 MG tablet Take 2 tablets (40 mg total) by mouth daily with breakfast. 11/13/21  Yes Carvel Getting, NP  atorvastatin (LIPITOR) 40 MG tablet Take 40 mg by mouth daily at 12 noon. 05/17/21   [provider]  b complex vitamins capsule Take 1 capsule by mouth daily.    [provider]  FLUoxetine (PROZAC) 40 MG capsule Take 1 capsule (40 mg total) by mouth daily. 09/24/20   Money, Lowry Ram, FNP  gabapentin (NEURONTIN) 600 MG tablet Take  1 tablet (600 mg total) by mouth 3 (three) times daily. 09/24/20   Money, Lowry Ram, FNP  hydrochlorothiazide (HYDRODIURIL) 25 MG tablet Take 1 tablet (25 mg total) by mouth daily. 09/24/20   Money, Lowry Ram, FNP  ibuprofen (ADVIL) 200 MG tablet Take 400 mg by mouth every 6 (six) hours as needed.    [provider]  MITIGARE 0.6 MG CAPS Take 1 capsule by mouth daily. 04/05/21   [provider]  naltrexone (DEPADE) 50 MG tablet Take 50 mg by mouth daily. 10/09/20   [provider]  Omega-3 Fatty Acids (FISH OIL) 1200 MG CAPS Take 1 capsule by mouth at bedtime.    [provider]  ondansetron (ZOFRAN) 4 MG tablet Take 4 mg by mouth every 6 (six) hours as needed.    [provider]  pantoprazole (PROTONIX) 40 MG tablet Take 1 tablet (40 mg total) by mouth daily. 10/07/21   Ladene Artist, MD  QUEtiapine (SEROQUEL) 100 MG tablet Take 1 tablet (100 mg total) by mouth 2 (two) times daily.  09/24/20   Money, Lowry Ram, FNP  QUEtiapine (SEROQUEL) 200 MG tablet Take 1 tablet (200 mg total) by mouth at bedtime. 09/24/20   Money, Lowry Ram, FNP  traZODone (DESYREL) 50 MG tablet Take 3 tablets (150 mg total) by mouth at bedtime as needed for sleep. 04/06/21   Revonda Humphrey, NP  metFORMIN (GLUCOPHAGE) 500 MG tablet Take 1 tablet (500 mg total) by mouth 2 (two) times daily with a meal. For diabetes management 06/09/18 11/30/19  Encarnacion Slates, NP    Family History Family History  Problem Relation Age of Onset   Depression Mother    Osteoporosis Mother    COPD Mother    Rheum arthritis Mother    Diabetes Father    Hypertension Father    Congestive Heart Failure Father    Aneurysm Father    Clotting disorder Father    Heart disease Father    Cancer Maternal Grandfather    Colon cancer Neg Hx    Esophageal cancer Neg Hx    Rectal cancer Neg Hx    Stomach cancer Neg Hx     Social History Social History   Tobacco Use   Smoking status: Never   Smokeless tobacco: Never  Vaping Use   Vaping Use: Never used  Substance Use Topics   Alcohol use: Not Currently   Drug use: Not Currently    Types: Heroin    Comment: no drug use for 3 years     Allergies   Sulfa antibiotics and Aspirin   Review of Systems Review of Systems  Constitutional:  Negative for chills and fever.  Skin:  Negative for color change and wound.    Physical Exam Triage Vital Signs ED Triage Vitals  Enc Vitals Group     BP 11/13/21 1346 113/74     Pulse Rate 11/13/21 1346 87     Resp 11/13/21 1346 16     Temp 11/13/21 1346 98.8 F (37.1 C)     Temp Source 11/13/21 1346 Oral     SpO2 11/13/21 1346 93 %     Weight 11/13/21 1345 216 lb 4.3 oz (98.1 kg)     Height 11/13/21 1345 5' 5.5" (1.664 m)     Head Circumference --      Peak Flow --      Pain Score 11/13/21 1344 10     Pain Loc --  Pain Edu? --      Excl. in [place]? --    No data found.  Updated Vital Signs BP 113/74 (BP Location:  Left Arm)    Pulse 87    Temp 98.8 F (37.1 C) (Oral)    Resp 16    Ht 5' 5.5" (1.664 m)    Wt 216 lb 4.3 oz (98.1 kg)    LMP 08/02/2013 Comment: irregular   SpO2 93%    BMI 35.44 kg/m   Visual Acuity Right Eye Distance:   Left Eye Distance:   Bilateral Distance:    Right Eye Near:   Left Eye Near:    Bilateral Near:     Physical Exam Constitutional:      General: She is not in acute distress.    Appearance: Normal appearance. She is not ill-appearing.  Cardiovascular:     Pulses:          Radial pulses are 2+ on the right side.  Pulmonary:     Effort: Pulmonary effort is normal.  Musculoskeletal:     Right upper arm: Tenderness present. No swelling, edema, deformity or bony tenderness.  Skin:    General: Skin is warm and dry.     Capillary Refill: Capillary refill takes less than 2 seconds.     Findings: No erythema or lesion.  Neurological:     Mental Status: She is alert.     UC Treatments / Results  Labs (all labs ordered are listed, but only abnormal results are displayed) Labs Reviewed - No data to display  EKG   Radiology No results found.  Procedures Procedures (including critical care time)  Medications Ordered in UC Medications - No data to display  Initial Impression / Assessment and Plan / UC Course  I have reviewed the triage vital signs and the nursing notes.  Pertinent labs & imaging results that were available during my care of the patient were reviewed by me and considered in my medical decision making (see chart for details).    Besides forearm/muscle tenderness to palpation, no concerning findings on exam.  We will treat for inflammation with prednisone.  Patient does not colchicine.  Patient to follow-up with her PCP.  Patient left angry.  Final Clinical Impressions(s) / UC Diagnoses   Final diagnoses:  Right arm pain     Discharge Instructions      Stop colchicine for now. Follow up with your primary care provider about your  gout/arm pain.    ED Prescriptions     Medication Sig Dispense Auth. Provider   predniSONE (DELTASONE) 20 MG tablet Take 2 tablets (40 mg total) by mouth daily with breakfast. 10 tablet Jonah Blue Dionne Bucy, NP      PDMP not reviewed this encounter.   Carvel Getting, NP 11/13/21 1420

## 2021-11-26 ENCOUNTER — Other Ambulatory Visit: Payer: Self-pay | Admitting: Nurse Practitioner

## 2021-11-26 DIAGNOSIS — K760 Fatty (change of) liver, not elsewhere classified: Secondary | ICD-10-CM

## 2021-11-26 DIAGNOSIS — R748 Abnormal levels of other serum enzymes: Secondary | ICD-10-CM

## 2021-11-26 DIAGNOSIS — D376 Neoplasm of uncertain behavior of liver, gallbladder and bile ducts: Secondary | ICD-10-CM

## 2021-12-10 ENCOUNTER — Ambulatory Visit
Admission: RE | Admit: 2021-12-10 | Discharge: 2021-12-10 | Disposition: A | Discharge status: Home / Self Care | Payer: Medicaid Other | Source: Ambulatory Visit | Attending: Internal Medicine | Admitting: Internal Medicine

## 2021-12-10 DIAGNOSIS — Z1231 Encounter for screening mammogram for malignant neoplasm of breast: Secondary | ICD-10-CM

## 2021-12-17 ENCOUNTER — Other Ambulatory Visit: Payer: Self-pay

## 2021-12-17 ENCOUNTER — Ambulatory Visit
Admission: RE | Admit: 2021-12-17 | Discharge: 2021-12-17 | Disposition: A | Discharge status: Home / Self Care | Payer: Medicaid Other | Source: Ambulatory Visit | Attending: Nurse Practitioner | Admitting: Nurse Practitioner

## 2021-12-17 DIAGNOSIS — D376 Neoplasm of uncertain behavior of liver, gallbladder and bile ducts: Secondary | ICD-10-CM

## 2021-12-17 DIAGNOSIS — R748 Abnormal levels of other serum enzymes: Secondary | ICD-10-CM

## 2021-12-17 DIAGNOSIS — K760 Fatty (change of) liver, not elsewhere classified: Secondary | ICD-10-CM

## 2021-12-17 MED ORDER — GADOBENATE DIMEGLUMINE 529 MG/ML IV SOLN
20.0000 mL | Freq: Once | INTRAVENOUS | Status: AC | PRN
  Administered 2021-12-17: 20 mL via INTRAVENOUS

## 2022-04-06 ENCOUNTER — Other Ambulatory Visit (HOSPITAL_COMMUNITY)
Admission: EM | Admit: 2022-04-06 | Discharge: 2022-04-09 | Disposition: A | Discharge status: Home / Self Care | Payer: Medicaid Other | Attending: Psychiatry | Admitting: Psychiatry

## 2022-04-06 DIAGNOSIS — F3130 Bipolar disorder, current episode depressed, mild or moderate severity, unspecified: Secondary | ICD-10-CM

## 2022-04-06 DIAGNOSIS — F10229 Alcohol dependence with intoxication, unspecified: Secondary | ICD-10-CM | POA: Insufficient documentation

## 2022-04-06 DIAGNOSIS — F319 Bipolar disorder, unspecified: Secondary | ICD-10-CM | POA: Insufficient documentation

## 2022-04-06 DIAGNOSIS — F1020 Alcohol dependence, uncomplicated: Secondary | ICD-10-CM

## 2022-04-06 DIAGNOSIS — F419 Anxiety disorder, unspecified: Secondary | ICD-10-CM | POA: Insufficient documentation

## 2022-04-06 DIAGNOSIS — Z20822 Contact with and (suspected) exposure to covid-19: Secondary | ICD-10-CM | POA: Insufficient documentation

## 2022-04-06 DIAGNOSIS — R45851 Suicidal ideations: Secondary | ICD-10-CM | POA: Insufficient documentation

## 2022-04-06 LAB — POCT URINE DRUG SCREEN - MANUAL ENTRY (I-SCREEN)
POC Amphetamine UR: NOT DETECTED
POC Buprenorphine (BUP): NOT DETECTED
POC Cocaine UR: NOT DETECTED
POC Marijuana UR: NOT DETECTED
POC Methadone UR: NOT DETECTED
POC Methamphetamine UR: NOT DETECTED
POC Morphine: NOT DETECTED
POC Oxazepam (BZO): NOT DETECTED
POC Oxycodone UR: NOT DETECTED
POC Secobarbital (BAR): NOT DETECTED

## 2022-04-06 LAB — ETHANOL: Alcohol, Ethyl (B): 200 mg/dL — ABNORMAL HIGH (ref <10)

## 2022-04-06 LAB — LIPID PANEL
Cholesterol: 155 mg/dL (ref 0–200)
HDL: 59 mg/dL (ref >40)
LDL Cholesterol: 34 mg/dL (ref 0–99)
Total CHOL/HDL Ratio: 2.6 ratio
Triglycerides: 312 mg/dL — ABNORMAL HIGH (ref <150)
VLDL: 62 mg/dL — ABNORMAL HIGH (ref 0–40)

## 2022-04-06 LAB — COMPREHENSIVE METABOLIC PANEL
ALT: 37 U/L (ref 0–44)
AST: 29 U/L (ref 15–41)
Albumin: 4.1 g/dL (ref 3.5–5.0)
Alkaline Phosphatase: 94 U/L (ref 38–126)
Anion gap: 14 (ref 5–15)
BUN: 14 mg/dL (ref 6–20)
CO2: 24 mmol/L (ref 22–32)
Calcium: 9.4 mg/dL (ref 8.9–10.3)
Chloride: 101 mmol/L (ref 98–111)
Creatinine, Ser: 0.83 mg/dL (ref 0.44–1.00)
GFR, Estimated: >60 mL/min (ref >60)
Glucose, Bld: 102 mg/dL — ABNORMAL HIGH (ref 70–99)
Potassium: 3.7 mmol/L (ref 3.5–5.1)
Sodium: 139 mmol/L (ref 135–145)
Total Bilirubin: 1 mg/dL (ref 0.3–1.2)
Total Protein: 6.8 g/dL (ref 6.5–8.1)

## 2022-04-06 LAB — CBC WITH DIFFERENTIAL/PLATELET
Abs Immature Granulocytes: 0.01 10*3/uL (ref 0.00–0.07)
Basophils Absolute: 0 10*3/uL (ref 0.0–0.1)
Basophils Relative: 1 %
Eosinophils Absolute: 0.1 10*3/uL (ref 0.0–0.5)
Eosinophils Relative: 1 %
HCT: 43.9 % (ref 36.0–46.0)
Hemoglobin: 14.8 g/dL (ref 12.0–15.0)
Immature Granulocytes: 0 %
Lymphocytes Relative: 49 %
Lymphs Abs: 2.6 10*3/uL (ref 0.7–4.0)
MCH: 30.8 pg (ref 26.0–34.0)
MCHC: 33.7 g/dL (ref 30.0–36.0)
MCV: 91.5 fL (ref 80.0–100.0)
Monocytes Absolute: 0.3 10*3/uL (ref 0.1–1.0)
Monocytes Relative: 6 %
Neutro Abs: 2.2 10*3/uL (ref 1.7–7.7)
Neutrophils Relative %: 43 %
Platelets: 243 10*3/uL (ref 150–400)
RBC: 4.8 MIL/uL (ref 3.87–5.11)
RDW: 12.4 % (ref 11.5–15.5)
WBC: 5.2 10*3/uL (ref 4.0–10.5)
nRBC: 0 % (ref 0.0–0.2)

## 2022-04-06 LAB — HEMOGLOBIN A1C
Hgb A1c MFr Bld: 5.7 % — ABNORMAL HIGH (ref 4.8–5.6)
Mean Plasma Glucose: 116.89 mg/dL

## 2022-04-06 LAB — POC SARS CORONAVIRUS 2 AG: SARSCOV2ONAVIRUS 2 AG: NEGATIVE

## 2022-04-06 MED ORDER — ACETAMINOPHEN 325 MG PO TABS
650.0000 mg | ORAL_TABLET | Freq: Four times a day (QID) | ORAL | Status: DC | PRN
  Administered 2022-04-07: 650 mg via ORAL
  Filled 2022-04-06: qty 2

## 2022-04-06 MED ORDER — THIAMINE HCL 100 MG PO TABS
100.0000 mg | ORAL_TABLET | Freq: Every day | ORAL | Status: DC
  Administered 2022-04-07 – 2022-04-08 (×2): 100 mg via ORAL
  Filled 2022-04-06 (×3): qty 1

## 2022-04-06 MED ORDER — LORAZEPAM 1 MG PO TABS
1.0000 mg | ORAL_TABLET | Freq: Four times a day (QID) | ORAL | Status: AC
  Administered 2022-04-06 – 2022-04-07 (×5): 1 mg via ORAL
  Filled 2022-04-06 (×6): qty 1

## 2022-04-06 MED ORDER — LORAZEPAM 1 MG PO TABS: 1.0000 mg | ORAL_TABLET | Freq: Two times a day (BID) | ORAL | Status: DC

## 2022-04-06 MED ORDER — LORAZEPAM 1 MG PO TABS
1.0000 mg | ORAL_TABLET | Freq: Four times a day (QID) | ORAL | Status: DC | PRN
  Administered 2022-04-07: 1 mg via ORAL

## 2022-04-06 MED ORDER — LOPERAMIDE HCL 2 MG PO CAPS: 2.0000 mg | ORAL_CAPSULE | ORAL | Status: DC | PRN

## 2022-04-06 MED ORDER — LORAZEPAM 1 MG PO TABS: 1.0000 mg | ORAL_TABLET | Freq: Every day | ORAL | Status: DC

## 2022-04-06 MED ORDER — MAGNESIUM HYDROXIDE 400 MG/5ML PO SUSP: 30.0000 mL | Freq: Every day | ORAL | Status: DC | PRN

## 2022-04-06 MED ORDER — ADULT MULTIVITAMIN W/MINERALS CH
1.0000 | ORAL_TABLET | Freq: Every day | ORAL | Status: DC
  Administered 2022-04-07 – 2022-04-08 (×2): 1 via ORAL
  Filled 2022-04-06 (×3): qty 1

## 2022-04-06 MED ORDER — ALUM & MAG HYDROXIDE-SIMETH 200-200-20 MG/5ML PO SUSP: 30.0000 mL | ORAL | Status: DC | PRN

## 2022-04-06 MED ORDER — ONDANSETRON 4 MG PO TBDP
4.0000 mg | ORAL_TABLET | Freq: Four times a day (QID) | ORAL | Status: DC | PRN
  Administered 2022-04-07: 4 mg via ORAL
  Filled 2022-04-06: qty 1

## 2022-04-06 MED ORDER — LORAZEPAM 1 MG PO TABS
1.0000 mg | ORAL_TABLET | Freq: Three times a day (TID) | ORAL | Status: DC
Start: 2022-04-08 — End: 2022-04-08

## 2022-04-06 MED ORDER — THIAMINE HCL 100 MG/ML IJ SOLN
100.0000 mg | Freq: Once | INTRAMUSCULAR | Status: AC
  Administered 2022-04-06: 100 mg via INTRAMUSCULAR
  Filled 2022-04-06: qty 2

## 2022-04-06 MED ORDER — HYDROXYZINE HCL 25 MG PO TABS: 25.0000 mg | ORAL_TABLET | Freq: Four times a day (QID) | ORAL | Status: DC | PRN

## 2022-04-06 NOTE — Progress Notes (Addendum)
Pt called EMS to get to [facility].  Has SI, when asked about a plan she says "sometimes."  Has had three previous attempts.  Pt denies HI.  Has A/V hallucinations.  Dreams which are very intense and intrusive   Pt drinks 1.5 liters of wine a day, drank 2 liters in the last 24 hours.  Pt tearful during triage.  Pt is urgent.  She wants help for her SA problems and suicidal thoughts.
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
## 2022-04-06 NOTE — Discharge Instructions (Addendum)
Dear Kristina Huffman,  It was a pleasure taking care of you while in the hospital.  You were admitted for alcohol detox.  You set up an appointment with [facility] so that you are able to undergo residential substance rehab.  Please bring your 30-day prescription of medications with you on Monday.  We are sending you home with 7-day samples of your prescriptions so that you will not have to take from your 30-day prescription samples.  If you feel like you are having worsening symptoms of alcohol craving or mood symptoms, please return to the [facility] UC for reevaluation and safety planning.  Take Care! [facility]BC

## 2022-04-07 DIAGNOSIS — F319 Bipolar disorder, unspecified: Secondary | ICD-10-CM | POA: Diagnosis not present

## 2022-04-07 DIAGNOSIS — F10229 Alcohol dependence with intoxication, unspecified: Secondary | ICD-10-CM | POA: Diagnosis not present

## 2022-04-07 DIAGNOSIS — F419 Anxiety disorder, unspecified: Secondary | ICD-10-CM | POA: Diagnosis not present

## 2022-04-07 DIAGNOSIS — R45851 Suicidal ideations: Secondary | ICD-10-CM | POA: Diagnosis not present

## 2022-04-07 DIAGNOSIS — F1020 Alcohol dependence, uncomplicated: Secondary | ICD-10-CM | POA: Diagnosis present

## 2022-04-07 LAB — RESP PANEL BY RT-PCR (FLU A&B, COVID) ARPGX2
Influenza A by PCR: NEGATIVE
Influenza B by PCR: NEGATIVE
SARS Coronavirus 2 by RT PCR: NEGATIVE

## 2022-04-07 LAB — TSH: TSH: 3.912 u[IU]/mL (ref 0.350–4.500)

## 2022-04-07 MED ORDER — QUETIAPINE FUMARATE 200 MG PO TABS
200.0000 mg | ORAL_TABLET | Freq: Every day | ORAL | Status: DC
  Administered 2022-04-07 – 2022-04-08 (×2): 200 mg via ORAL
  Filled 2022-04-07 (×2): qty 1

## 2022-04-07 MED ORDER — FLUOXETINE HCL 20 MG PO CAPS
40.0000 mg | ORAL_CAPSULE | Freq: Every day | ORAL | Status: DC
  Administered 2022-04-07 – 2022-04-09 (×3): 40 mg via ORAL
  Filled 2022-04-07 (×2): qty 2
  Filled 2022-04-07 (×2): qty 14
  Filled 2022-04-07: qty 2

## 2022-04-07 MED ORDER — TRAZODONE HCL 150 MG PO TABS
150.0000 mg | ORAL_TABLET | Freq: Every evening | ORAL | Status: DC | PRN
  Administered 2022-04-07: 150 mg via ORAL
  Filled 2022-04-07 (×2): qty 1

## 2022-04-07 MED ORDER — QUETIAPINE FUMARATE 100 MG PO TABS
100.0000 mg | ORAL_TABLET | Freq: Two times a day (BID) | ORAL | Status: DC
  Administered 2022-04-07 – 2022-04-08 (×3): 100 mg via ORAL
  Filled 2022-04-07 (×4): qty 1

## 2022-04-07 MED ORDER — PANTOPRAZOLE SODIUM 40 MG PO TBEC
40.0000 mg | DELAYED_RELEASE_TABLET | Freq: Every day | ORAL | Status: DC
  Administered 2022-04-07 – 2022-04-08 (×2): 40 mg via ORAL
  Filled 2022-04-07 (×3): qty 1

## 2022-04-07 MED ORDER — IBUPROFEN 400 MG PO TABS
400.0000 mg | ORAL_TABLET | Freq: Once | ORAL | Status: AC
  Administered 2022-04-07: 400 mg via ORAL
  Filled 2022-04-07: qty 1

## 2022-04-07 MED ORDER — GABAPENTIN 300 MG PO CAPS
600.0000 mg | ORAL_CAPSULE | Freq: Three times a day (TID) | ORAL | Status: DC
  Administered 2022-04-07 – 2022-04-08 (×5): 600 mg via ORAL
  Filled 2022-04-07 (×6): qty 2

## 2022-04-07 NOTE — Progress Notes (Signed)
Patient is presently asleep in bed snoring.  No complaints and no evidence of etoh withdrawal.  Patient encouraged to make needs known to staff.  Will monitor and provide safe environment.

## 2022-04-07 NOTE — ED Notes (Signed)
Pt arrived to the unit.

## 2022-04-07 NOTE — ED Notes (Signed)
Patient A&Ox4. Denies intent to harm self/others when asked. Denies A/VH. Patient denies any physical complaints when asked. No acute distress noted. Support and encouragement provided. Routine safety checks conducted according to facility protocol. Encouraged patient to notify staff if thoughts of harm toward self or others arise. Patient verbalize understanding and agreement. Will continue to monitor for safety.    

## 2022-04-07 NOTE — ED Notes (Signed)
Pt asleep in bed. Respirations even and unlabored. Will continue to monitor for safety. ?

## 2022-04-07 NOTE — ED Notes (Addendum)
Patient resting quietly in bed with eyes closed, Respirations equal and unlabored, skin warm and dry, NAD. No change in assessment or acuity. Routine safety checks conducted according to facility protocol. Will continue to monitor for safety.      

## 2022-04-07 NOTE — ED Provider Notes (Signed)
Facility Based Crisis Admission H&P  Date: 04/07/22 Patient Name: Kristina Huffman MRN: [id number] Chief Complaint:  Chief Complaint  Patient presents with   Suicidal   Alcohol Problem      Diagnoses:  Final diagnoses:  Alcohol use disorder, severe, dependence ([place])  Suicidal ideation  Bipolar I disorder, most recent episode depressed ([place])    HPI: Kristina Huffman 61 y.o., female patient presented to [facility] as a walk in with increased depression, SI and Alcohol intoxication. She was admitted to the continuous assessment unit. She was reevaluated at this time and recommended for admission to the [facility] to continue alcohol detox.   Kristina Huffman, 61 y.o., female patient seen face to face by this provider, consulted with Dr. Dwyane Dee; and chart reviewed on 04/07/22.  Per chart review patient has a psychiatric history of MDD, anxiety, alcohol use disorder, SI, and bipolar disorder. She has a history of 3 suicide attempts, with last attempt being 3 yrs ago.   On evaluation Kristina Huffman reports she has never gotten past her sons death roughly 62 years ago. She has coped with this loss by drinking/abusing alcohol. She drinks up to 1.5 liters of wine daily and has done so  for years. She drinks till the point of passes out. She has had on a 2-3 day break of sobriety but reports her withdrawal symptoms would intensity and she would drink to relieve symptoms. Her ETOH on admission was 200. UDS is negative. She denies all other substance use.  She denies any past history of delirium tremors.  She denies any history of alcohol withdrawal seizures. However, she reports one-time she woke up with blood in her mouth after biting her tongue.  Reports she was evaluated by a physician in the emergency department and EEG was performed and there was no seizure activity.  There is no documented seizures per chart review.  She is seeking alcohol detox and residential treatment.  During evaluation Kristina Huffman is  laying in her bed asleep.  She is easily awakened.  She is alert/oriented x4, cooperative, and attentive.  She is disheveled and makes good eye contact.  She has normal speech.  She is tearful at times. She continues to endorse depression with feelings of hopelessness, worthlessness, guilt, low motivation, decreased sleep and appetite.  She has a depressed affect.  She endorsed suicidal ideations upon admission while she was intoxicated but states at this time those feelings have resolved.  Reports she is passively suicidal at times but has no intent, plan, or access to means.  She denies HI/AVH.  Patient is able to converse coherently, goal directed thoughts, no distractibility, or pre-occupation.    Patient is placed on an Ativan taper and continues to tolerate medications without any adverse reactions.  Reports her current withdrawal symptoms are feeling anxious and jittery.  She has a slight fine tremor in her hands present.  Last CIWA score 4. Vital signs are WNL.   PHQ 2-9:  [employer] from 11/12/2020 in [facility] [employer] [facility] Office Visit from 03/18/2017 in [place]  Thoughts that you would be better off dead, or of hurting yourself in some way Not at all Not at all  PHQ-9 Total Score 5 18       Flowsheet Row ED from 04/06/2022 in [facility] ED from 11/13/2021 in [facility] ED from 04/06/2021 in [facility]  C-SSRS RISK CATEGORY Moderate Risk No Risk Error: Question 1 not populated        Total Time spent with patient: 30 minutes  Musculoskeletal  Strength & Muscle Tone: within normal limits Gait & Station: normal Patient leans: N/A  Psychiatric Specialty Exam  Presentation General Appearance: Disheveled  Eye Contact:Good  Speech:Clear and Coherent; Normal Rate  Speech Volume:Normal  Handedness:Right   Mood and  Affect  Mood:Anxious; Depressed; Hopeless  Affect:Congruent; Tearful; Depressed   Thought Process  Thought Processes:Coherent  Descriptions of Associations:Intact  Orientation:Full (Time, Place and Person)  Thought Content:Logical  Diagnosis of Schizophrenia or Schizoaffective disorder in past: No  Duration of Psychotic Symptoms: Greater than six months  Hallucinations:Hallucinations: None  Ideas of Reference:None  Suicidal Thoughts:Suicidal Thoughts: Yes, Passive SI Active Intent and/or Plan: Without Intent; Without Plan; Without Access to Means  Homicidal Thoughts:Homicidal Thoughts: No   Sensorium  Memory:Immediate Good; Recent Good; Remote Good  Judgment:Fair  Insight:Fair   Executive Functions  Concentration:Good  Attention Span:Good  Recall:Good  Fund of Knowledge:Good  Language:Good   Psychomotor Activity  Psychomotor Activity:Psychomotor Activity: Normal   Assets  Assets:Communication Skills; Desire for Improvement; Financial Resources/Insurance; Housing; Physical Health; Resilience; Social Support   Sleep  Sleep:Sleep: Poor Number of Hours of Sleep: 5   Nutritional Assessment (For OBS and FBC admissions only) Has the patient had a weight loss or gain of 10 pounds or more in the last 3 months?: No Has the patient had a decrease in food intake/or appetite?: Yes Does the patient have dental problems?: No Does the patient have eating habits or behaviors that may be indicators of an eating disorder including binging or inducing vomiting?: No Has the patient recently lost weight without trying?: 0 Has the patient been eating poorly because of a decreased appetite?: 0 Malnutrition Screening Tool Score: 0    Physical Exam Vitals and nursing note reviewed.  Constitutional:      General: She is not in acute distress.    Appearance: Normal appearance. She is not ill-appearing.  HENT:     Head: Normocephalic.  Eyes:     General:         Right eye: No discharge.        Left eye: No discharge.     Conjunctiva/sclera: Conjunctivae normal.     Pupils: Pupils are equal, round, and reactive to light.  Cardiovascular:     Rate and Rhythm: Normal rate.  Pulmonary:     Effort: Pulmonary effort is normal. No respiratory distress.  Musculoskeletal:        General: Normal range of motion.     Cervical back: Normal range of motion.  Skin:    General: Skin is warm and dry.     Coloration: Skin is not jaundiced or pale.  Neurological:     Mental Status: She is alert and oriented to person, place, and time.  Psychiatric:        Attention and Perception: Attention and perception normal.        Mood and Affect: Mood is anxious and depressed. Affect is tearful.        Speech: Speech normal.        Behavior: Behavior is cooperative.        Thought Content: Thought content normal.        Cognition and Memory: Cognition normal.        Judgment: Judgment normal.    Review of Systems  Constitutional: Negative.   HENT: Negative.  Eyes: Negative.   Respiratory: Negative.    Cardiovascular: Negative.   Musculoskeletal: Negative.   Skin: Negative.   Neurological: Negative.   Psychiatric/Behavioral:  Positive for depression, substance abuse and suicidal ideas. The patient is nervous/anxious.     Blood pressure 118/70, pulse 78, temperature 98.4 F (36.9 C), temperature source Oral, resp. rate 18, last menstrual period 08/02/2013, SpO2 94 %. There is no height or weight on file to calculate BMI.  Past Psychiatric History:  depression, anxiety, alcohol use disorder, suicidal ideation, and bipolar disorder  Is the patient at risk to self? No  Has the patient been a risk to self in the past 6 months? Yes .    Has the patient been a risk to self within the distant past? Yes   Is the patient a risk to others? No   Has the patient been a risk to others in the past 6 months? No   Has the patient been a risk to others within the distant  past? No   Past Medical History:  Past Medical History:  Diagnosis Date   Aneurysm of splenic artery (HCC)    Anxiety    Asthma    Bipolar disorder ([place])    Depression    Diabetes mellitus without complication ([place])    Diabetic peripheral neuropathy (HCC)    Elevated LFTs    ETOH abuse    GERD (gastroesophageal reflux disease)    Hepatitis C    Heroin abuse ([place])    History of MRSA infection    legs and spread to face   Hypertension    Insomnia    Mixed hyperlipidemia    OSA (obstructive sleep apnea)    Sleep apnea     Past Surgical History:  Procedure Laterality Date   BACK SURGERY     RADIAL HEAD ARTHROPLASTY  08/09/2012   Procedure: RADIAL HEAD ARTHROPLASTY;  Surgeon: Schuyler Amor, MD;  Location: [facility];  Service: Orthopedics;  Laterality: Left;  Left Radial head Replacement   WISDOM TOOTH EXTRACTION      Family History:  Family History  Problem Relation Age of Onset   Depression Mother    Osteoporosis Mother    COPD Mother    Rheum arthritis Mother    Diabetes Father    Hypertension Father    Congestive Heart Failure Father    Aneurysm Father    Clotting disorder Father    Heart disease Father    Cancer Maternal Grandfather    Colon cancer Neg Hx    Esophageal cancer Neg Hx    Rectal cancer Neg Hx    Stomach cancer Neg Hx     Social History:  Social History   Socioeconomic History   Marital status: Legally Separated    Spouse name: Not on file   Number of children: 4   Years of education: Not on file   Highest education level: Not on file  Occupational History   Not on file  Tobacco Use   Smoking status: Never   Smokeless tobacco: Never  Vaping Use   Vaping Use: Never used  Substance and Sexual Activity   Alcohol use: Not Currently   Drug use: Not Currently    Types: Heroin    Comment: no drug use for 3 years   Sexual activity: Yes    Birth control/protection: None  Other Topics Concern   Not on file  Social History Narrative    Not on file   Social Determinants of  Health   Financial Resource Strain: Not on file  Food Insecurity: Food Insecurity Present (11/12/2020)   Hunger Vital Sign    Worried About Running Out of Food in the Last Year: Sometimes true    Ran Out of Food in the Last Year: Sometimes true  Transportation Needs: No Transportation Needs (11/12/2020)   PRAPARE - Hydrologist (Medical): No    Lack of Transportation (Non-Medical): No  Physical Activity: Not on file  Stress: Not on file  Social Connections: Not on file  Intimate Partner Violence: Not on file    SDOH:  SDOH Screenings   Alcohol Screen: Medium Risk (06/06/2018)   Alcohol Screen    Last Alcohol Screening Score (AUDIT): 33  Depression (PHQ2-9): Medium Risk (11/12/2020)   Depression (PHQ2-9)    PHQ-2 Score: 5  Financial Resource Strain: Not on file  Food Insecurity: Food Insecurity Present (11/12/2020)   Hunger Vital Sign    Worried About Running Out of Food in the Last Year: Sometimes true    Ran Out of Food in the Last Year: Sometimes true  Housing: Not on file  Physical Activity: Not on file  Social Connections: Not on file  Stress: Not on file  Tobacco Use: Low Risk  (12/10/2021)   Patient History    Smoking Tobacco Use: Never    Smokeless Tobacco Use: Never    Passive Exposure: Not on file  Transportation Needs: No Transportation Needs (11/12/2020)   PRAPARE - Transportation    Lack of Transportation (Medical): No    Lack of Transportation (Non-Medical): No    Last Labs:  Admission on 04/06/2022  Component Date Value Ref Range Status   SARS Coronavirus 2 by RT PCR 04/06/2022 NEGATIVE  NEGATIVE Final   Comment: (NOTE) SARS-CoV-2 target nucleic acids are NOT DETECTED.  The SARS-CoV-2 RNA is generally detectable in upper respiratory specimens during the acute phase of infection. The lowest concentration of SARS-CoV-2 viral copies this assay can detect is 138 copies/mL. A negative result  does not preclude SARS-Cov-2 infection and should not be used as the sole basis for treatment or other patient management decisions. A negative result may occur with  improper specimen collection/handling, submission of specimen other than nasopharyngeal swab, presence of viral mutation(s) within the areas targeted by this assay, and inadequate number of viral copies(<138 copies/mL). A negative result must be combined with clinical observations, patient history, and epidemiological information. The expected result is Negative.  Fact Sheet for Patients:  EntrepreneurPulse.com.au  Fact Sheet for Healthcare Providers:  IncredibleEmployment.be  This test is no                          t yet approved or cleared by the [place] FDA and  has been authorized for detection and/or diagnosis of SARS-CoV-2 by FDA under an Emergency Use Authorization (EUA). This EUA will remain  in effect (meaning this test can be used) for the duration of the COVID-19 declaration under Section 564(b)(1) of the Act, 21 U.S.C.section 360bbb-3(b)(1), unless the authorization is terminated  or revoked sooner.       Influenza A by PCR 04/06/2022 NEGATIVE  NEGATIVE Final   Influenza B by PCR 04/06/2022 NEGATIVE  NEGATIVE Final   Comment: (NOTE) The Xpert Xpress SARS-CoV-2/FLU/RSV plus assay is intended as an aid in the diagnosis of influenza from Nasopharyngeal swab specimens and should not be used as a sole basis for treatment. Nasal washings and  aspirates are unacceptable for Xpert Xpress SARS-CoV-2/FLU/RSV testing.  Fact Sheet for Patients: EntrepreneurPulse.com.au  Fact Sheet for Healthcare Providers: IncredibleEmployment.be  This test is not yet approved or cleared by the [place] FDA and has been authorized for detection and/or diagnosis of SARS-CoV-2 by FDA under an Emergency Use Authorization (EUA). This EUA will  remain in effect (meaning this test can be used) for the duration of the COVID-19 declaration under Section 564(b)(1) of the Act, 21 U.S.C. section 360bbb-3(b)(1), unless the authorization is terminated or revoked.  Performed at [facility], [place] [address], [place], [place] [address]    WBC 04/06/2022 5.2  4.0 - 10.5 K/uL Final   RBC 04/06/2022 4.80  3.87 - 5.11 MIL/uL Final   Hemoglobin 04/06/2022 14.8  12.0 - 15.0 g/dL Final   HCT 04/06/2022 43.9  36.0 - 46.0 % Final   MCV 04/06/2022 91.5  80.0 - 100.0 fL Final   MCH 04/06/2022 30.8  26.0 - 34.0 pg Final   MCHC 04/06/2022 33.7  30.0 - 36.0 g/dL Final   RDW 04/06/2022 12.4  11.5 - 15.5 % Final   Platelets 04/06/2022 243  150 - 400 K/uL Final   nRBC 04/06/2022 0.0  0.0 - 0.2 % Final   Neutrophils Relative % 04/06/2022 43  % Final   Neutro Abs 04/06/2022 2.2  1.7 - 7.7 K/uL Final   Lymphocytes Relative 04/06/2022 49  % Final   Lymphs Abs 04/06/2022 2.6  0.7 - 4.0 K/uL Final   Monocytes Relative 04/06/2022 6  % Final   Monocytes Absolute 04/06/2022 0.3  0.1 - 1.0 K/uL Final   Eosinophils Relative 04/06/2022 1  % Final   Eosinophils Absolute 04/06/2022 0.1  0.0 - 0.5 K/uL Final   Basophils Relative 04/06/2022 1  % Final   Basophils Absolute 04/06/2022 0.0  0.0 - 0.1 K/uL Final   Immature Granulocytes 04/06/2022 0  % Final   Abs Immature Granulocytes 04/06/2022 0.01  0.00 - 0.07 K/uL Final   Performed at [facility], [place] [address], [place], [place] [address]   Sodium 04/06/2022 139  135 - 145 mmol/L Final   Potassium 04/06/2022 3.7  3.5 - 5.1 mmol/L Final   Chloride 04/06/2022 101  98 - 111 mmol/L Final   CO2 04/06/2022 24  22 - 32 mmol/L Final   Glucose, Bld 04/06/2022 102 (H)  70 - 99 mg/dL Final   Glucose reference range applies only to samples taken after fasting for at least 8 hours.   BUN 04/06/2022 14  6 - 20 mg/dL Final   Creatinine, Ser 04/06/2022 0.83  0.44 - 1.00 mg/dL Final   Calcium 04/06/2022 9.4  8.9  - 10.3 mg/dL Final   Total Protein 04/06/2022 6.8  6.5 - 8.1 g/dL Final   Albumin 04/06/2022 4.1  3.5 - 5.0 g/dL Final   AST 04/06/2022 29  15 - 41 U/L Final   ALT 04/06/2022 37  0 - 44 U/L Final   Alkaline Phosphatase 04/06/2022 94  38 - 126 U/L Final   Total Bilirubin 04/06/2022 1.0  0.3 - 1.2 mg/dL Final   GFR, Estimated 04/06/2022 >60  >60 mL/min Final   Comment: (NOTE) Calculated using the CKD-EPI Creatinine Equation (2021)    Anion gap 04/06/2022 14  5 - 15 Final   Performed at [facility] [address] [place], [place] [address]   Hgb A1c MFr Bld 04/06/2022 5.7 (H)  4.8 - 5.6 % Final  Comment: (NOTE) Pre diabetes:          5.7%-6.4%  Diabetes:              >6.4%  Glycemic control for   <7.0% adults with diabetes    Mean Plasma Glucose 04/06/2022 116.89  mg/dL Final   Performed at [facility] [address], [place], [place] [address]   Alcohol, Ethyl (B) 04/06/2022 200 (H)  <10 mg/dL Final   Comment: (NOTE) Lowest detectable limit for serum alcohol is 10 mg/dL.  For medical purposes only. Performed at [facility], [place] [address] [place], [place] [address]    TSH 04/06/2022 3.912  0.350 - 4.500 uIU/mL Final   Comment: Performed by a 3rd Generation assay with a functional sensitivity of <=0.01 uIU/mL. Performed at [facility], [place] [address] [place], [place] [address]    POC Amphetamine UR 04/06/2022 None Detected  NONE DETECTED (Cut Off Level 1000 ng/mL) Final   POC Secobarbital (BAR) 04/06/2022 None Detected  NONE DETECTED (Cut Off Level 300 ng/mL) Final   POC Buprenorphine (BUP) 04/06/2022 None Detected  NONE DETECTED (Cut Off Level 10 ng/mL) Final   POC Oxazepam (BZO) 04/06/2022 None Detected  NONE DETECTED (Cut Off Level 300 ng/mL) Final   POC Cocaine UR 04/06/2022 None Detected  NONE DETECTED (Cut Off Level 300 ng/mL) Final   POC Methamphetamine UR 04/06/2022 None Detected  NONE DETECTED (Cut Off Level 1000 ng/mL) Final   POC  Morphine 04/06/2022 None Detected  NONE DETECTED (Cut Off Level 300 ng/mL) Final   POC Methadone UR 04/06/2022 None Detected  NONE DETECTED (Cut Off Level 300 ng/mL) Final   POC Oxycodone UR 04/06/2022 None Detected  NONE DETECTED (Cut Off Level 100 ng/mL) Final   POC Marijuana UR 04/06/2022 None Detected  NONE DETECTED (Cut Off Level 50 ng/mL) Final   SARSCOV2ONAVIRUS 2 AG 04/06/2022 NEGATIVE  NEGATIVE Final   Comment: (NOTE) SARS-CoV-2 antigen NOT DETECTED.   Negative results are presumptive.  Negative results do not preclude SARS-CoV-2 infection and should not be used as the sole basis for treatment or other patient management decisions, including infection  control decisions, particularly in the presence of clinical signs and  symptoms consistent with COVID-19, or in those who have been in contact with the virus.  Negative results must be combined with clinical observations, patient history, and epidemiological information. The expected result is Negative.  Fact Sheet for Patients: HandmadeRecipes.com.cy  Fact Sheet for Healthcare Providers: FuneralLife.at  This test is not yet approved or cleared by the [place] FDA and  has been authorized for detection and/or diagnosis of SARS-CoV-2 by FDA under an Emergency Use Authorization (EUA).  This EUA will remain in effect (meaning this test can be used) for the duration of  the COV                          ID-19 declaration under Section 564(b)(1) of the Act, 21 U.S.C. section 360bbb-3(b)(1), unless the authorization is terminated or revoked sooner.     Cholesterol 04/06/2022 155  0 - 200 mg/dL Final   Triglycerides 04/06/2022 312 (H)  <150 mg/dL Final   HDL 04/06/2022 59  >40 mg/dL Final   Total CHOL/HDL Ratio 04/06/2022 2.6  RATIO Final   VLDL 04/06/2022 62 (H)  0 - 40 mg/dL Final   LDL Cholesterol 04/06/2022 34  0 - 99 mg/dL Final   Comment:  Total Cholesterol/HDL:CHD  Risk Coronary Heart Disease Risk Table                     Men   Women  1/2 Average Risk   3.4   3.3  Average Risk       5.0   4.4  2 X Average Risk   9.6   7.1  3 X Average Risk  23.4   11.0        Use the calculated Patient Ratio above and the CHD Risk Table to determine the patient's CHD Risk.        ATP III CLASSIFICATION (LDL):  <100     mg/dL   Optimal  100-129  mg/dL   Near or Above                    Optimal  130-159  mg/dL   Borderline  160-189  mg/dL   High  >190     mg/dL   Very High Performed at [facility] [address], [place], [place] [address]     Allergies: Sulfa antibiotics and Aspirin  PTA Medications: (Not in a hospital admission)   Long Term Goals: Improvement in symptoms so as ready for discharge  Short Term Goals: Patient will verbalize feelings in meetings with treatment team members., Patient will attend at least of 50% of the groups daily., Pt will complete the PHQ9 on admission, day 3 and discharge., Patient will participate in completing the [place], Patient will score a low risk of violence for 24 hours prior to discharge, and Patient will take medications as prescribed daily.  Medical Decision Making  Patient presents with alcohol intoxication and ETOH of 200. She is requesting alcohol detox and residential treatment. She meets criteria for admission to the [facility]     Recommendations  Based on my evaluation the patient does not appear to have an emergency medical condition.  Disposition: Admit to the [facility].   Ativan taper scheduled to end on 04/11/2022.   SW notified to seek residential treatment.   Revonda Humphrey, NP 04/07/22  9:27 AM

## 2022-04-07 NOTE — Group Note (Signed)
Group Topic: Wellness  Group Date: 04/07/2022 Start Time: [place] End Time: [place] Facilitators: Leana Roe  Department: [facility]  Number of Participants: 7  Group Focus: activities of daily living skills, chemical dependency education, chemical dependency issues, coping skills, self-awareness, and self-esteem Treatment Modality:  Behavior Modification Therapy, Individual Therapy, Interpersonal Therapy, and Patient-Centered Therapy Interventions utilized were patient education and support Purpose: enhance coping skills, regain self-worth, and relapse prevention strategies  Name: Kristina Huffman Date of Birth: May 10, 1961  MR: [id number]    Level of Participation: minimal Quality of Participation: attentive and cooperative Interactions with others: N/A Mood/Affect: appropriate Triggers (if applicable): N/A Cognition: coherent/clear Progress: Minimal Response: n/a Plan: follow-up needed  Patients Problems:  Patient Active Problem List   Diagnosis Date Noted   Alcohol addiction ([place]) 04/07/2022   Obstructive sleep apnea syndrome 05/08/2020   Acute respiratory failure due to COVID-19 ([place]) 10/14/2019   ARF (acute renal failure) ([place]) 10/14/2019   Essential hypertension 10/14/2019   Controlled type 2 diabetes mellitus with hyperglycemia ([place]) 10/14/2019   Alcohol use disorder, severe, dependence ([place]) 06/06/2018   Deviated nasal septum 12/03/2017   Mixed hyperlipidemia 11/29/2017   Gastroesophageal reflux disease 10/06/2017   Chronic hepatitis C without hepatic coma ([place]) 06/04/2016   Substance induced mood disorder ([place]) 09/20/2013   Benzodiazepine dependence ([place]) 09/21/2011   Bipolar 1 disorder, mixed, moderate ([place]) 09/21/2011   PTSD (post-traumatic stress disorder) 09/21/2011

## 2022-04-07 NOTE — ED Notes (Signed)
Popsicle given

## 2022-04-07 NOTE — ED Provider Notes (Signed)
[facility] Continuous Assessment Admission H&P  Date: 04/07/22 Patient Name: Kristina Huffman MRN: [id number] Chief Complaint:  Chief Complaint  Patient presents with   Suicidal   Alcohol Problem      Diagnoses:  Final diagnoses:  Alcohol use disorder, severe, dependence ([place])  Suicidal ideation    HPI: Kristina Huffman is a 61 year old female with psychiatric history of depression, anxiety, alcohol use disorder, suicidal ideation, and bipolar disorder.  Patient presents to [facility] via [facility] with complaint of worsening depressive symptoms, suicidal ideation, and requesting substance abuse treatment.    Patient reports that she is experiencing worsening depressive symptoms due to ongoing struggle with alcohol abuse.  She reports that she uses alcohol to help cope with the death her 30 years old son. She says her son passed away due to an overdose 10 years ago. She reports drinking 1.5 to 2 liters of 40% alcoholic beverages (wines) daily since son's death. She says she last consumed alcohol on 04/06/22. She says she has attempted several times to stop using alcohol but has been unsuccessful. She says 2 months ago she attempted to detox at home without medical intervention and woke up with blood in her mouth from biting her tongue. She thinks she might had had a seizure. She denies prior history of seizures, she denies DTs.  She says she is unable to contract for safety; she is endorsing active suicidal ideation with plan to overdose.  She reports prior history of 3 suicidal attempts; patient states she last attempted suicide 3 years ago.  She denies homicidal ideation, hallucination, paranoia, and substance abuse other than alcohol.   Patient says she is interested in residential substance abuse treatment program.  She says she would like to go to [facility] after alcohol detox.   Patient is tearful, alert, and oriented x 4.  She is cooperative; she is speaking in a normal tone of voice  at a moderate rate with good eye contact.  Patient's mood is depressed with tearful affect.  Her thought process is coherent.  There is no evidence that patient is responding to internal/external stimuli or experiencing any delusional thought content during this assessment.    PHQ 2-9:  [employer] from 11/12/2020 in [facility] Office Visit from 03/18/2017 in [place]  Thoughts that you would be better off dead, or of hurting yourself in some way Not at all Not at all  PHQ-9 Total Score 5 18       [facility] from 04/06/2022 in [facility] ED from 11/13/2021 in [facility] at [facility] ED from 04/06/2021 in [place] Moderate Risk No Risk Error: Question 1 not populated        Total Time spent with patient: 20 minutes  Musculoskeletal  Strength & Muscle Tone: within normal limits Gait & Station: normal Patient leans: Right  Psychiatric Specialty Exam  Presentation General Appearance: Disheveled  Eye Contact:Good  Speech:Clear and Coherent  Speech Volume:Normal  Handedness:Right   Mood and Affect  Mood:Depressed  Affect:Tearful   Thought Process  Thought Processes:Coherent  Descriptions of Associations:Intact  Orientation:Full (Time, Place and Person)  Thought Content:WDL  Diagnosis of Schizophrenia or Schizoaffective disorder in past: No  Duration of Psychotic Symptoms: Greater than six months  Hallucinations:Hallucinations: None  Ideas of Reference:None  Suicidal Thoughts:Suicidal Thoughts: Yes, Active SI Active Intent and/or Plan:  With Plan  Homicidal Thoughts:Homicidal Thoughts: No   Sensorium  Memory:Immediate Good; Recent Fair; Remote Fair  Judgment:Poor  Insight:Fair   Executive Functions  Concentration:Fair  Attention  Span:Fair  [place] of Knowledge:Good  Language:Good   Psychomotor Activity  Psychomotor Activity:Psychomotor Activity: Normal   Assets  Assets:Desire for Improvement; Armed forces logistics/support/administrative officer; Housing; Social Support   Sleep  Sleep:Sleep: Fair Number of Hours of Sleep: 5   Nutritional Assessment (For OBS and FBC admissions only) Has the patient had a weight loss or gain of 10 pounds or more in the last 3 months?: No Has the patient had a decrease in food intake/or appetite?: Yes Does the patient have dental problems?: No Does the patient have eating habits or behaviors that may be indicators of an eating disorder including binging or inducing vomiting?: No Has the patient recently lost weight without trying?: 0 Has the patient been eating poorly because of a decreased appetite?: 0 Malnutrition Screening Tool Score: 0    Physical Exam Vitals and nursing note reviewed.  Constitutional:      General: She is not in acute distress.    Appearance: She is well-developed.  HENT:     Head: Normocephalic and atraumatic.  Eyes:     Conjunctiva/sclera: Conjunctivae normal.  Cardiovascular:     Rate and Rhythm: Normal rate.  Pulmonary:     Effort: Pulmonary effort is normal. No respiratory distress.     Breath sounds: Normal breath sounds.  Abdominal:     Palpations: Abdomen is soft.     Tenderness: There is no abdominal tenderness.  Musculoskeletal:        General: No swelling.     Cervical back: Normal range of motion.  Skin:    General: Skin is warm and dry.     Capillary Refill: Capillary refill takes less than 2 seconds.  Neurological:     Mental Status: She is alert and oriented to person, place, and time.  Psychiatric:        Attention and Perception: Attention and perception normal.        Mood and Affect: Mood is anxious and depressed. Affect is tearful.        Speech: Speech normal.        Behavior: Behavior normal. Behavior is cooperative.         Thought Content: Thought content includes suicidal ideation. Thought content includes suicidal plan.        Cognition and Memory: Cognition normal.    Review of Systems  Constitutional: Negative.   HENT: Negative.    Eyes: Negative.   Respiratory: Negative.    Cardiovascular: Negative.   Gastrointestinal: Negative.   Genitourinary: Negative.   Musculoskeletal: Negative.   Skin: Negative.   Neurological: Negative.   Endo/Heme/Allergies: Negative.   Psychiatric/Behavioral:  Positive for depression, substance abuse and suicidal ideas. The patient is nervous/anxious.     Blood pressure 112/80, pulse 93, temperature 99 F (37.2 C), temperature source Oral, resp. rate 17, last menstrual period 08/02/2013, SpO2 95 %. There is no height or weight on file to calculate BMI.  Past Psychiatric History: Bipolar disorder, depression, anxiety, suicidal ideation, alcohol abuse  Is the patient at risk to self? Yes  Has the patient been a risk to self in the past 6 months? No .    Has the patient been a risk to self within the distant past? Yes   Is the patient a risk to others? No   Has the patient been a  risk to others in the past 6 months? No   Has the patient been a risk to others within the distant past? No   Past Medical History:  Past Medical History:  Diagnosis Date   Aneurysm of splenic artery (HCC)    Anxiety    Asthma    Bipolar disorder ([employer])    Depression    Diabetes mellitus without complication ([place])    Diabetic peripheral neuropathy (HCC)    Elevated LFTs    ETOH abuse    GERD (gastroesophageal reflux disease)    Hepatitis C    Heroin abuse ([place])    History of MRSA infection    legs and spread to face   Hypertension    Insomnia    Mixed hyperlipidemia    OSA (obstructive sleep apnea)    Sleep apnea     Past Surgical History:  Procedure Laterality Date   BACK SURGERY     RADIAL HEAD ARTHROPLASTY  08/09/2012   Procedure: RADIAL HEAD ARTHROPLASTY;  Surgeon:  Schuyler Amor, MD;  Location: [facility];  Service: Orthopedics;  Laterality: Left;  Left Radial head Replacement   WISDOM TOOTH EXTRACTION      Family History:  Family History  Problem Relation Age of Onset   Depression Mother    Osteoporosis Mother    COPD Mother    Rheum arthritis Mother    Diabetes Father    Hypertension Father    Congestive Heart Failure Father    Aneurysm Father    Clotting disorder Father    Heart disease Father    Cancer Maternal Grandfather    Colon cancer Neg Hx    Esophageal cancer Neg Hx    Rectal cancer Neg Hx    Stomach cancer Neg Hx     Social History:  Social History   Socioeconomic History   Marital status: Legally Separated    Spouse name: Not on file   Number of children: 4   Years of education: Not on file   Highest education level: Not on file  Occupational History   Not on file  Tobacco Use   Smoking status: Never   Smokeless tobacco: Never  Vaping Use   Vaping Use: Never used  Substance and Sexual Activity   Alcohol use: Not Currently   Drug use: Not Currently    Types: Heroin    Comment: no drug use for 3 years   Sexual activity: Yes    Birth control/protection: None  Other Topics Concern   Not on file  Social History Narrative   Not on file   Social Determinants of Health   Financial Resource Strain: Not on file  Food Insecurity: Food Insecurity Present (11/12/2020)   Hunger Vital Sign    Worried About Running Out of Food in the Last Year: Sometimes true    Ran Out of Food in the Last Year: Sometimes true  Transportation Needs: No Transportation Needs (11/12/2020)   PRAPARE - Hydrologist (Medical): No    Lack of Transportation (Non-Medical): No  Physical Activity: Not on file  Stress: Not on file  Social Connections: Not on file  Intimate Partner Violence: Not on file    SDOH:  SDOH Screenings   Alcohol Screen: Medium Risk (06/06/2018)   Alcohol Screen    Last Alcohol  Screening Score (AUDIT): 33  Depression (PHQ2-9): Medium Risk (11/12/2020)   Depression (PHQ2-9)    PHQ-2 Score: 5  Financial Resource Strain: Not on  file  Food Insecurity: Food Insecurity Present (11/12/2020)   Hunger Vital Sign    Worried About Running Out of Food in the Last Year: Sometimes true    Ran Out of Food in the Last Year: Sometimes true  Housing: Not on file  Physical Activity: Not on file  Social Connections: Not on file  Stress: Not on file  Tobacco Use: Low Risk  (12/10/2021)   Patient History    Smoking Tobacco Use: Never    Smokeless Tobacco Use: Never    Passive Exposure: Not on file  Transportation Needs: No Transportation Needs (11/12/2020)   PRAPARE - Transportation    Lack of Transportation (Medical): No    Lack of Transportation (Non-Medical): No    Last Labs:  Admission on 04/06/2022  Component Date Value Ref Range Status   WBC 04/06/2022 5.2  4.0 - 10.5 K/uL Final   RBC 04/06/2022 4.80  3.87 - 5.11 MIL/uL Final   Hemoglobin 04/06/2022 14.8  12.0 - 15.0 g/dL Final   HCT 04/06/2022 43.9  36.0 - 46.0 % Final   MCV 04/06/2022 91.5  80.0 - 100.0 fL Final   MCH 04/06/2022 30.8  26.0 - 34.0 pg Final   MCHC 04/06/2022 33.7  30.0 - 36.0 g/dL Final   RDW 04/06/2022 12.4  11.5 - 15.5 % Final   Platelets 04/06/2022 243  150 - 400 K/uL Final   nRBC 04/06/2022 0.0  0.0 - 0.2 % Final   Neutrophils Relative % 04/06/2022 43  % Final   Neutro Abs 04/06/2022 2.2  1.7 - 7.7 K/uL Final   Lymphocytes Relative 04/06/2022 49  % Final   Lymphs Abs 04/06/2022 2.6  0.7 - 4.0 K/uL Final   Monocytes Relative 04/06/2022 6  % Final   Monocytes Absolute 04/06/2022 0.3  0.1 - 1.0 K/uL Final   Eosinophils Relative 04/06/2022 1  % Final   Eosinophils Absolute 04/06/2022 0.1  0.0 - 0.5 K/uL Final   Basophils Relative 04/06/2022 1  % Final   Basophils Absolute 04/06/2022 0.0  0.0 - 0.1 K/uL Final   Immature Granulocytes 04/06/2022 0  % Final   Abs Immature Granulocytes 04/06/2022  0.01  0.00 - 0.07 K/uL Final   Performed at [facility], [place] [address] [place], [place] [address]   Sodium 04/06/2022 139  135 - 145 mmol/L Final   Potassium 04/06/2022 3.7  3.5 - 5.1 mmol/L Final   Chloride 04/06/2022 101  98 - 111 mmol/L Final   CO2 04/06/2022 24  22 - 32 mmol/L Final   Glucose, Bld 04/06/2022 102 (H)  70 - 99 mg/dL Final   Glucose reference range applies only to samples taken after fasting for at least 8 hours.   BUN 04/06/2022 14  6 - 20 mg/dL Final   Creatinine, Ser 04/06/2022 0.83  0.44 - 1.00 mg/dL Final   Calcium 04/06/2022 9.4  8.9 - 10.3 mg/dL Final   Total Protein 04/06/2022 6.8  6.5 - 8.1 g/dL Final   Albumin 04/06/2022 4.1  3.5 - 5.0 g/dL Final   AST 04/06/2022 29  15 - 41 U/L Final   ALT 04/06/2022 37  0 - 44 U/L Final   Alkaline Phosphatase 04/06/2022 94  38 - 126 U/L Final   Total Bilirubin 04/06/2022 1.0  0.3 - 1.2 mg/dL Final   GFR, Estimated 04/06/2022 >60  >60 mL/min Final   Comment: (NOTE) Calculated using the CKD-EPI Creatinine Equation (2021)    Anion gap 04/06/2022 14  5 - 15 Final   Performed at [facility], [place] [address], [place], [place] [address]   Hgb A1c MFr Bld 04/06/2022 5.7 (H)  4.8 - 5.6 % Final   Comment: (NOTE) Pre diabetes:          5.7%-6.4%  Diabetes:              >6.4%  Glycemic control for   <7.0% adults with diabetes    Mean Plasma Glucose 04/06/2022 116.89  mg/dL Final   Performed at [facility], [place] [address], [place], [place] [address]   Alcohol, Ethyl (B) 04/06/2022 200 (H)  <10 mg/dL Final   Comment: (NOTE) Lowest detectable limit for serum alcohol is 10 mg/dL.  For medical purposes only. Performed at [facility], [place] [address], [place], [place] [address]    POC Amphetamine UR 04/06/2022 None Detected  NONE DETECTED (Cut Off Level 1000 ng/mL) Final   POC Secobarbital (BAR) 04/06/2022 None Detected  NONE DETECTED (Cut Off Level 300 ng/mL) Final   POC Buprenorphine (BUP)  04/06/2022 None Detected  NONE DETECTED (Cut Off Level 10 ng/mL) Final   POC Oxazepam (BZO) 04/06/2022 None Detected  NONE DETECTED (Cut Off Level 300 ng/mL) Final   POC Cocaine UR 04/06/2022 None Detected  NONE DETECTED (Cut Off Level 300 ng/mL) Final   POC Methamphetamine UR 04/06/2022 None Detected  NONE DETECTED (Cut Off Level 1000 ng/mL) Final   POC Morphine 04/06/2022 None Detected  NONE DETECTED (Cut Off Level 300 ng/mL) Final   POC Methadone UR 04/06/2022 None Detected  NONE DETECTED (Cut Off Level 300 ng/mL) Final   POC Oxycodone UR 04/06/2022 None Detected  NONE DETECTED (Cut Off Level 100 ng/mL) Final   POC Marijuana UR 04/06/2022 None Detected  NONE DETECTED (Cut Off Level 50 ng/mL) Final   SARSCOV2ONAVIRUS 2 AG 04/06/2022 NEGATIVE  NEGATIVE Final   Comment: (NOTE) SARS-CoV-2 antigen NOT DETECTED.   Negative results are presumptive.  Negative results do not preclude SARS-CoV-2 infection and should not be used as the sole basis for treatment or other patient management decisions, including infection  control decisions, particularly in the presence of clinical signs and  symptoms consistent with COVID-19, or in those who have been in contact with the virus.  Negative results must be combined with clinical observations, patient history, and epidemiological information. The expected result is Negative.  Fact Sheet for Patients: HandmadeRecipes.com.cy  Fact Sheet for Healthcare Providers: FuneralLife.at  This test is not yet approved or cleared by the [place] FDA and  has been authorized for detection and/or diagnosis of SARS-CoV-2 by FDA under an Emergency Use Authorization (EUA).  This EUA will remain in effect (meaning this test can be used) for the duration of  the COV                          ID-19 declaration under Section 564(b)(1) of the Act, 21 U.S.C. section 360bbb-3(b)(1), unless the authorization is terminated or  revoked sooner.     Cholesterol 04/06/2022 155  0 - 200 mg/dL Final   Triglycerides 04/06/2022 312 (H)  <150 mg/dL Final   HDL 04/06/2022 59  >40 mg/dL Final   Total CHOL/HDL Ratio 04/06/2022 2.6  RATIO Final   VLDL 04/06/2022 62 (H)  0 - 40 mg/dL Final   LDL Cholesterol 04/06/2022 34  0 - 99 mg/dL Final   Comment:  Total Cholesterol/HDL:CHD Risk Coronary Heart Disease Risk Table                     Men   Women  1/2 Average Risk   3.4   3.3  Average Risk       5.0   4.4  2 X Average Risk   9.6   7.1  3 X Average Risk  23.4   11.0        Use the calculated Patient Ratio above and the CHD Risk Table to determine the patient's CHD Risk.        ATP III CLASSIFICATION (LDL):  <100     mg/dL   Optimal  100-129  mg/dL   Near or Above                    Optimal  130-159  mg/dL   Borderline  160-189  mg/dL   High  >190     mg/dL   Very High Performed at [facility] [address], [place], [place] [address]     Allergies: Sulfa antibiotics and Aspirin  PTA Medications: (Not in a hospital admission)   Medical Decision Making  Patient says she is unable to contract for safety at this time; she will be admitted to [facility] for continuous assessment and management of alcohol withdrawal symptoms. Lab Orders         Resp Panel by RT-PCR (Flu A&B, Covid) Anterior Nasal Swab         CBC with Differential/Platelet         Comprehensive metabolic panel         Hemoglobin A1c         Ethanol         TSH         Lipid panel         POCT Urine Drug Screen - (I-Screen)         POC SARS Coronavirus 2 Ag     -CIWA protocol initiated to manage alcohol withdrawal symptoms -Resume home medication -monitor patient for safety    Recommendations  Based on my evaluation the patient does not appear to have an emergency medical condition.  Ophelia Shoulder, NP 04/07/22  12:06 AM

## 2022-04-07 NOTE — ED Notes (Signed)
Patient admitted to [facility] from [facility] due to ongoing ETOH use.  Patient is calm, pleasant appears sad and demoralized.  Patient asked for and was given scrubs, toiletries and linens.  After being oriented to unit she was shown to her room and took a shower.  Patient asked for her clothing to be washed and was assisted with same.  Patient has mild tremor however no tongue vesiculation and b/p pulse are in wnl.

## 2022-04-07 NOTE — ED Notes (Addendum)
Pt admitted to obs for SI with plan to OD and alcohol use. Pt A&O x4, calm and cooperative. Denies current HI/AVH. Pt tolerated lab work and skin assessment well. Pt ambulated independently to unit. Oriented to unit/staff. Sandwich, chips, and juice given per pt request. No signs of acute distress noted. Will continue to monitor for safety.

## 2022-04-08 DIAGNOSIS — F419 Anxiety disorder, unspecified: Secondary | ICD-10-CM | POA: Diagnosis not present

## 2022-04-08 DIAGNOSIS — F319 Bipolar disorder, unspecified: Secondary | ICD-10-CM | POA: Diagnosis not present

## 2022-04-08 DIAGNOSIS — F10229 Alcohol dependence with intoxication, unspecified: Secondary | ICD-10-CM | POA: Diagnosis not present

## 2022-04-08 DIAGNOSIS — R45851 Suicidal ideations: Secondary | ICD-10-CM | POA: Diagnosis not present

## 2022-04-08 MED ORDER — LORAZEPAM 0.5 MG PO TABS
0.5000 mg | ORAL_TABLET | Freq: Two times a day (BID) | ORAL | Status: DC
  Filled 2022-04-08: qty 1

## 2022-04-08 MED ORDER — TRAZODONE HCL 50 MG PO TABS
50.0000 mg | ORAL_TABLET | Freq: Every evening | ORAL | Status: DC | PRN
  Administered 2022-04-08: 50 mg via ORAL
  Filled 2022-04-08: qty 1

## 2022-04-08 MED ORDER — LORAZEPAM 1 MG PO TABS
1.0000 mg | ORAL_TABLET | Freq: Two times a day (BID) | ORAL | Status: DC
  Administered 2022-04-08: 1 mg via ORAL

## 2022-04-08 NOTE — Progress Notes (Signed)
Patient attended a Group on Depressio, anxiety and coping run by [employer].  She participated well and demonstrated good insight.

## 2022-04-08 NOTE — ED Notes (Signed)
Provider contacted r/t  evening dose lipitor no new orders obtained.

## 2022-04-08 NOTE — ED Notes (Signed)
Patient remains dysphoric with sad affect.  She is isolative and minimally verbal.  Patient is calm and makes needs known to staff.  She is without withdrawal symptoms.  Denies avh shi or plan at this time.  Will monitor and provide a safe environment.

## 2022-04-08 NOTE — ED Notes (Signed)
Pt requested for trazodone for sleep. When nurse was about to administer the medication, pt stated the dosage is too much for me I just want 50 mg. Medication was opened already. Sharyn Lull LPN witnessed disposal of medication.

## 2022-04-08 NOTE — ED Notes (Signed)
Minimal interaction with others pt was asleep until awakened for medications pass. Affect flat , mood labile behavior self controlled , pt self isolates speeding much of her time sleeping. Pt requested HS lipitor 20 mg po , provider Williams made aware but no new orders obtained.

## 2022-04-08 NOTE — ED Provider Notes (Signed)
FBC Progress Note  Date and Time: 04/08/2022 12:19 PM Name: Michaline Kindig MRN:  [id number]  Reason For Admission: Loie Jahr is a 61 year old female with history of depression, bipolar disorder, alcohol use disorder, anxiety presenting to [facility] for alcohol detox and residential substance treatment placement.  Subjective:    Seen and assessed at bedside.  Patient reports desiring to quit alcohol entirely due to feeling that it would make her life better.  Patient reports that her brother has been sober from alcohol for approximately 7 months and she wanted to be like him as he appeared much healthier than before.  Patient reports alcohol "does not really benefit me at all".  Patient feels that she drinks because she cannot sleep and then drinks to pass the time.  Patient recognizes that she is not very engaged with life and reports this is very problematic to her.  Patient denies SI/HI/AVH.  Patient denies acute alcohol withdrawal at this time.  Patient reports that she had some yesterday but otherwise is doing well.  VSS.  Patient denies history of observed DTs/seizures.  Patient has never been hospitalized for alcohol withdrawal.  Patient feels that she has mildly elevated anxiety but that she attributes to her alcohol withdrawal.  Patient feels that her mood is otherwise "fine".  Patient is asking that we do not change her psychotropic medication regiment as what she currently is on is appropriate and has been doing well for her.  Discussed adding naltrexone for alcohol dependence and patient said she would consider it.  Patient is amenable to residential substance use treatment.  Patient has preference to going to a [place] [facility] if possible but understands that this may not be able to request that can be met and still would be interested in going to a substance use rehab treatment regardless.  Diagnosis:  Final diagnoses:  Alcohol use disorder, severe, dependence (HCC)  Suicidal ideation   Bipolar I disorder, most recent episode depressed ([place])    Total Time spent with patient: 45 minutes   Labs  Lab Results:     Latest Ref Rng & Units 04/06/2022   10:20 PM 04/06/2021    5:45 AM 09/23/2020   10:30 PM  CBC  WBC 4.0 - 10.5 K/uL 5.2  5.7  6.1   Hemoglobin 12.0 - 15.0 g/dL 14.8  15.2  15.4   Hematocrit 36.0 - 46.0 % 43.9  44.4  44.2   Platelets 150 - 400 K/uL 243  275  232       Latest Ref Rng & Units 04/06/2022   10:20 PM 10/07/2021   11:48 AM 04/06/2021    5:45 AM  CMP  Glucose 70 - 99 mg/dL 102   113   BUN 6 - 20 mg/dL 14   15   Creatinine 0.44 - 1.00 mg/dL 0.83   0.71   Sodium 135 - 145 mmol/L 139   134   Potassium 3.5 - 5.1 mmol/L 3.7   3.5   Chloride 98 - 111 mmol/L 101   100   CO2 22 - 32 mmol/L 24   24   Calcium 8.9 - 10.3 mg/dL 9.4   9.5   Total Protein 6.5 - 8.1 g/dL 6.8  7.5  7.0   Total Bilirubin 0.3 - 1.2 mg/dL 1.0  1.1  1.2   Alkaline Phos 38 - 126 U/L 94  104  94   AST 15 - 41 U/L 29  41  40   ALT 0 - 44  U/L 37  42  52     Physical Findings   AIMS    Flowsheet Row Admission (Discharged) from 06/06/2018 in [place] [facility]  AIMS Total Score 0      AUDIT    Flowsheet Row Admission (Discharged) from 06/06/2018 in [place] [facility] Admission (Discharged) from 09/20/2013 in [place] [facility] ED to Hosp-Admission (Discharged) from 06/28/2013 in [place] [facility]  Alcohol Use Disorder Identification Test Final Score (AUDIT) 33 23 [place] Office Visit from 11/12/2020 in Center for [employer] at [facility] Office Visit from 03/18/2017 in [place]  Total GAD-7 Score 0 7      PHQ2-9    [place] Office Visit from 11/12/2020 in Center for [place] at [facility] Office Visit from 03/18/2017 in [place]  Office Visit from 06/04/2016 in [facility]  PHQ-2 Total Score 3 5 0  PHQ-9 Total Score 5 18 --      [place] ED from 04/06/2022 in [facility] ED from 11/13/2021 in [facility] at [facility] ED from 04/06/2021 in [place] High Risk No Risk Error: Question 1 not populated        Musculoskeletal  Strength & Muscle Tone: within normal limits Gait & Station: normal Patient leans: N/A  Psychiatric Specialty Exam  Presentation  General Appearance: Disheveled   Eye Contact:Good   Speech:Clear and Coherent; Normal Rate   Speech Volume:Normal   Handedness:Right    Mood and Affect  Mood:Anxious; Depressed; Hopeless   Affect:Congruent; Tearful; Depressed    Thought Process  Thought Processes:Coherent   Descriptions of Associations:Intact   Orientation:Full (Time, Place and Person)   Thought Content:Logical   Diagnosis of Schizophrenia or Schizoaffective disorder in past: No     Hallucinations:Hallucinations: None   Ideas of Reference:None   Suicidal Thoughts:Suicidal Thoughts: Yes, Passive SI Active Intent and/or Plan: Without Intent; Without Plan; Without Access to Means   Homicidal Thoughts:Homicidal Thoughts: No    Sensorium  Memory:Immediate Good; Recent Good; Remote Good   Judgment:Fair   Insight:Fair    Executive Functions  Concentration:Good   Attention Span:Good   Recall:Good   Fund of Knowledge:Good   Language:Good    Psychomotor Activity  Psychomotor Activity:Psychomotor Activity: Normal    Assets  Assets:Communication Skills; Desire for Improvement; Financial Resources/Insurance; Housing; Physical Health; Resilience; Social Support    Sleep  Sleep:Sleep: Poor    Physical Exam  Physical Exam Vitals and nursing note reviewed.  Constitutional:      General: She is not  in acute distress.    Appearance: She is well-developed.  HENT:     Head: Normocephalic and atraumatic.  Eyes:     Conjunctiva/sclera: Conjunctivae normal.  Cardiovascular:     Rate and Rhythm: Normal rate and regular rhythm.     Heart sounds: No murmur heard. Pulmonary:     Effort: Pulmonary effort is normal. No respiratory distress.     Breath sounds: Normal breath sounds.  Abdominal:     Palpations: Abdomen is soft.     Tenderness: There is no abdominal tenderness.  Musculoskeletal:        General: No swelling.     Cervical back: Neck supple.  Skin:  General: Skin is warm and dry.     Capillary Refill: Capillary refill takes less than 2 seconds.  Neurological:     Mental Status: She is alert.  Psychiatric:        Mood and Affect: Mood normal.    Review of Systems  Respiratory:  Negative for shortness of breath.   Cardiovascular:  Negative for chest pain.  Gastrointestinal:  Negative for abdominal pain, constipation, diarrhea, heartburn, nausea and vomiting.  Neurological:  Negative for headaches.   Blood pressure 91/73, pulse 72, temperature 98.9 F (37.2 C), temperature source Oral, resp. rate 18, last menstrual period 08/02/2013, SpO2 95 %. There is no height or weight on file to calculate BMI.  ASSESSMENT Smera Morreale is a 61 year old female with history of depression, bipolar disorder, alcohol use disorder, anxiety presenting to [facility] for alcohol detox and residential substance treatment placement.  PLAN Alcohol Use Disorder Bipolar Disorder Does not appear to be acutely withdrawing -Ativan taper, will extend to taper if patient has severe alcohol withdrawal -CIWA protocol with Ativan -Patient will consider naltrexone -Seroquel 100 twice daily and 200 at night -Gabapentin 600 3 times daily for anxiety and alcohol craving -Continue Prozac 40 mg for depression symptoms -Trazodone 150 mg nightly as needed for insomnia -Imodium, milk of magnesia, ondansetron,  Maalox, Tylenol are available as needed for patient's alcohol withdrawal -MVI/thiamine  GERD -Continue Protonix 40 mg daily  Dispo residential substance use treatment once completed alcohol detox   France Ravens, MD 04/08/2022 12:19 PM

## 2022-04-08 NOTE — Clinical Social Work Psych Note (Signed)
LCSW Initial Note  LCSW met with Kristina Huffman for introduction and to begin discussions regarding treatment and potential discharge planning.   Kristina Huffman presented with an euthymic affect, congruent mood. Kristina Huffman denied having any SI, HI or AVH with this Probation officer.Kristina Huffman shared that she presented to the [facility] seeking assistance for worsening depressive symptoms, suicidal ideation and alcohol intoxication.  According to Kristina Huffman's initial CCA note, "Pt [facility] at [facility] voluntarily.  She called EMS because of having suicidal thoughts.  Has been having them for the last 3 weeks.  She has had three previous attempts.  She does not have a specific plan at this time.  Pt last attempt was in 2010-11-28.  Her son died of an overdose that year.  She said that her brother lives with her now.  Pt has had suicidal thoughts with no plan.  She has had three previous attempts.  Pt denies any HI.  She sometimes sees her deceased son.  She will hear voices and says she has very vivid, intrusive dreams that sometimes come true.  Pt has no access to guns.  Pt says she drinks about 1.5 liters of wine a day.  She has done so for the last 10 years.  Pt says she has had about 2 liters today prior to arrival".  Kristina Huffman shared with this wiriter that she has been struggling with various stressors, however she identified the passing of her son 10 years ago as her main stressor. Kristina Huffman reports she struggles with the grief process and to "numb my pain, I drink". Kristina Huffman endorsed drinking about 1.5 liters of wine daily.   Kristina Huffman identified her alcohol use as detrimental to her well-being and grief process and expressed interest in participating in a residential treatment program. LCSW explained the referral process and Kristina Huffman was agreeable.   LCSW referred the patient to  [facility] for review.   LCSW will continue to follow for possible placement.   Radonna Ricker, MSW, LCSW Clinical Education officer, museum ([place]) [facility]

## 2022-04-08 NOTE — ED Notes (Signed)
Pt is currently asleep with no signs of distress noted.

## 2022-04-08 NOTE — ED Notes (Signed)
Remains asleep no sleep disturbance or distress noted respirations are easy rise and fall of chest noted.

## 2022-04-08 NOTE — Progress Notes (Signed)
Patient has been asleep most of the day.  She awoke for lunch and then returned to room.  She remains constricted with dysphoric affect and sad mood.  Patient is organized and logical.  She denies avh shi or plan.  She is calm and pleasant and withdrawal symptoms are being managed by ativan taper.  Patient requested to speak with MD who has left for the day.  After prompting she reported that she did not want to take serroquel or gabapentin as she feels the dose is too high.  She stated that the medication has been making her extremely tired.  She was educated about right of refusal and she is refusing the next 2 doses.  She was encouraged to speak to MD in the A.M. about dosages.  No other issues reported.

## 2022-04-08 NOTE — ED Notes (Signed)
Pt is in the bed sleeping. Respirations are even and unlabored. No acute distress noted. Will continue to monitor for safety. 

## 2022-04-09 ENCOUNTER — Encounter (HOSPITAL_COMMUNITY): Payer: Self-pay

## 2022-04-09 DIAGNOSIS — F10229 Alcohol dependence with intoxication, unspecified: Secondary | ICD-10-CM | POA: Diagnosis not present

## 2022-04-09 DIAGNOSIS — F319 Bipolar disorder, unspecified: Secondary | ICD-10-CM | POA: Diagnosis not present

## 2022-04-09 DIAGNOSIS — R45851 Suicidal ideations: Secondary | ICD-10-CM | POA: Diagnosis not present

## 2022-04-09 DIAGNOSIS — F419 Anxiety disorder, unspecified: Secondary | ICD-10-CM | POA: Diagnosis not present

## 2022-04-09 MED ORDER — VITAMIN D3 50 MCG (2000 UT) PO TABS: 2000.0000 ug | ORAL_TABLET | Freq: Every day | ORAL | 0 refills | Status: AC

## 2022-04-09 MED ORDER — TRAZODONE HCL 100 MG PO TABS
100.0000 mg | ORAL_TABLET | Freq: Every day | ORAL | Status: DC
  Filled 2022-04-09: qty 7

## 2022-04-09 MED ORDER — PANTOPRAZOLE SODIUM 40 MG PO TBEC: 40.0000 mg | DELAYED_RELEASE_TABLET | Freq: Every day | ORAL | Status: DC | PRN

## 2022-04-09 MED ORDER — FLUOXETINE HCL 40 MG PO CAPS: 40.0000 mg | ORAL_CAPSULE | Freq: Every day | ORAL | 0 refills | Status: DC

## 2022-04-09 MED ORDER — QUETIAPINE FUMARATE 100 MG PO TABS
100.0000 mg | ORAL_TABLET | Freq: Every day | ORAL | 0 refills | Status: AC
Start: 2022-04-09 — End: 2022-12-25

## 2022-04-09 MED ORDER — OMEGA-3-ACID ETHYL ESTERS 1 G PO CAPS
1.0000 g | ORAL_CAPSULE | Freq: Every day | ORAL | Status: DC
  Filled 2022-04-09: qty 7

## 2022-04-09 MED ORDER — GABAPENTIN 300 MG PO CAPS
600.0000 mg | ORAL_CAPSULE | Freq: Every day | ORAL | Status: DC
  Filled 2022-04-09: qty 14

## 2022-04-09 MED ORDER — HYDROCHLOROTHIAZIDE 25 MG PO TABS
25.0000 mg | ORAL_TABLET | Freq: Every day | ORAL | Status: DC
  Administered 2022-04-09: 25 mg via ORAL
  Filled 2022-04-09: qty 7
  Filled 2022-04-09: qty 1

## 2022-04-09 MED ORDER — ATORVASTATIN CALCIUM 40 MG PO TABS: 40.0000 mg | ORAL_TABLET | Freq: Every day | ORAL | 0 refills | Status: AC

## 2022-04-09 MED ORDER — GABAPENTIN 300 MG PO CAPS: 600.0000 mg | ORAL_CAPSULE | Freq: Every day | ORAL | 0 refills | Status: DC

## 2022-04-09 MED ORDER — ATORVASTATIN CALCIUM 40 MG PO TABS
40.0000 mg | ORAL_TABLET | Freq: Every day | ORAL | Status: DC
  Filled 2022-04-09: qty 7

## 2022-04-09 MED ORDER — QUETIAPINE FUMARATE 100 MG PO TABS
100.0000 mg | ORAL_TABLET | Freq: Every day | ORAL | Status: DC
  Filled 2022-04-09: qty 21

## 2022-04-09 MED ORDER — VITAMIN D 25 MCG (1000 UNIT) PO TABS
2000.0000 [IU] | ORAL_TABLET | Freq: Every day | ORAL | Status: DC
  Filled 2022-04-09: qty 14

## 2022-04-09 MED ORDER — FISH OIL 1200 MG PO CAPS: 1200.0000 mg | ORAL_CAPSULE | Freq: Every evening | ORAL | 0 refills | Status: AC

## 2022-04-09 MED ORDER — QUETIAPINE FUMARATE 200 MG PO TABS: 200.0000 mg | ORAL_TABLET | Freq: Every day | ORAL | 0 refills | Status: DC

## 2022-04-09 MED ORDER — TRAZODONE HCL 100 MG PO TABS
100.0000 mg | ORAL_TABLET | Freq: Every day | ORAL | 0 refills | Status: DC
Start: 2022-04-09 — End: 2023-06-29

## 2022-04-09 MED ORDER — HYDROCHLOROTHIAZIDE 25 MG PO TABS: 25.0000 mg | ORAL_TABLET | Freq: Every day | ORAL | 0 refills | Status: AC

## 2022-04-09 NOTE — ED Notes (Signed)
Pt reports she would like to discharge home today.  States she worries about her brother being home alone and reports her cravings to use are not present at this time.  Provider made aware.

## 2022-04-09 NOTE — ED Provider Notes (Signed)
FBC Progress Note  Date and Time: 04/09/2022 12:04 PM Name: Tenita Cue MRN:  [id number]  Reason For Admission: Thu Baggett is a 61 year old female with history of depression, bipolar disorder, alcohol use disorder, anxiety presenting to [facility] for alcohol detox and residential substance treatment placement.  Subjective:    Seen and assessed at bedside. Denies SI/HI/AVH. Mood is "good".  Patient refusing further Ativan at this time.  Patient also wanted to correct the current medication regimen that she is on as she feels that it is not correct.  Patient reports also requesting discharge tomorrow because she has "Lots to take care of".  Patient expresses understanding that returning home prior to going to [facility] puts her at risk for relapse and they may deny her access if she relapses.  Patient verbalized understanding reports that she has to take care of of some business at home including for her brother.  Diagnosis:  Final diagnoses:  Alcohol use disorder, severe, dependence ([place])  Suicidal ideation  Bipolar I disorder, most recent episode depressed ([place])    Total Time spent with patient: 45 minutes   Labs  Lab Results:     Latest Ref Rng & Units 04/06/2022   10:20 PM 04/06/2021    5:45 AM 09/23/2020   10:30 PM  CBC  WBC 4.0 - 10.5 K/uL 5.2  5.7  6.1   Hemoglobin 12.0 - 15.0 g/dL 14.8  15.2  15.4   Hematocrit 36.0 - 46.0 % 43.9  44.4  44.2   Platelets 150 - 400 K/uL 243  275  232       Latest Ref Rng & Units 04/06/2022   10:20 PM 10/07/2021   11:48 AM 04/06/2021    5:45 AM  CMP  Glucose 70 - 99 mg/dL 102   113   BUN 6 - 20 mg/dL 14   15   Creatinine 0.44 - 1.00 mg/dL 0.83   0.71   Sodium 135 - 145 mmol/L 139   134   Potassium 3.5 - 5.1 mmol/L 3.7   3.5   Chloride 98 - 111 mmol/L 101   100   CO2 22 - 32 mmol/L 24   24   Calcium 8.9 - 10.3 mg/dL 9.4   9.5   Total Protein 6.5 - 8.1 g/dL 6.8  7.5  7.0   Total Bilirubin 0.3 - 1.2 mg/dL 1.0  1.1  1.2   Alkaline Phos 38 -  126 U/L 94  104  94   AST 15 - 41 U/L 29  41  40   ALT 0 - 44 U/L 37  42  52     Physical Findings   AIMS    Flowsheet Row Admission (Discharged) from 06/06/2018 in [place] [facility]  AIMS Total Score 0      AUDIT    Flowsheet Row Admission (Discharged) from 06/06/2018 in [place] [facility] Admission (Discharged) from 09/20/2013 in [place] [facility] ED to Hosp-Admission (Discharged) from 06/28/2013 in [place] [facility]  Alcohol Use Disorder Identification Test Final Score (AUDIT) 33 23 [place] Office Visit from 11/12/2020 in [place] for [employer] at [facility] for Women Office Visit from 03/18/2017 in [place]  Total GAD-7 Score 0 7      PHQ2-9    [place] Office Visit from 11/12/2020 in [place] for [employer] at  [place] for Women Office Visit from 03/18/2017 in [place] Office Visit from 06/04/2016 in [facility]  PHQ-2 Total Score 3 5 0  PHQ-9 Total Score 5 18 --      Flowsheet Row ED from 04/06/2022 in [facility] ED from 11/13/2021 in [facility] at [facility] ED from 04/06/2021 in [place] High Risk No Risk Error: Question 1 not populated        Musculoskeletal  Strength & Muscle Tone: within normal limits Gait & Station: normal Patient leans: N/A  Psychiatric Specialty Exam  Presentation  General Appearance: Disheveled   Eye Contact:Good   Speech:Clear and Coherent; Normal Rate   Speech Volume:Normal   Handedness:Right    Mood and Affect  Mood:Anxious   Affect:Appropriate; Congruent    Thought Process  Thought Processes:Coherent; Goal Directed; Linear   Descriptions of  Associations:Intact   Orientation:Full (Time, Place and Person)   Thought Content:Logical   Diagnosis of Schizophrenia or Schizoaffective disorder in past: No     Hallucinations:Hallucinations: None    Ideas of Reference:None   Suicidal Thoughts:Suicidal Thoughts: No    Homicidal Thoughts:Homicidal Thoughts: No     Sensorium  Memory:Immediate Good; Recent Good; Remote Good   Judgment:Fair   Insight:Fair    Executive Functions  Concentration:Good   Attention Span:Good   [place] of Knowledge:Good   Language:Good    Psychomotor Activity  Psychomotor Activity:Psychomotor Activity: Normal     Assets  Assets:Communication Skills; Desire for Improvement; Financial Resources/Insurance; Housing; Physical Health; Resilience; Social Support    Sleep  Sleep:Sleep: Good     Physical Exam  Physical Exam Vitals and nursing note reviewed.  Constitutional:      General: She is not in acute distress.    Appearance: She is well-developed.  HENT:     Head: Normocephalic and atraumatic.  Eyes:     Conjunctiva/sclera: Conjunctivae normal.  Cardiovascular:     Rate and Rhythm: Normal rate and regular rhythm.     Heart sounds: No murmur heard. Pulmonary:     Effort: Pulmonary effort is normal. No respiratory distress.     Breath sounds: Normal breath sounds.  Abdominal:     Palpations: Abdomen is soft.     Tenderness: There is no abdominal tenderness.  Musculoskeletal:        General: No swelling.     Cervical back: Neck supple.  Skin:    General: Skin is warm and dry.     Capillary Refill: Capillary refill takes less than 2 seconds.  Neurological:     Mental Status: She is alert.  Psychiatric:        Mood and Affect: Mood normal.    Review of Systems  Respiratory:  Negative for shortness of breath.   Cardiovascular:  Negative for chest pain.  Gastrointestinal:  Negative for abdominal pain, constipation, diarrhea, heartburn,  nausea and vomiting.  Neurological:  Negative for headaches.   Blood pressure 130/83, pulse 68, temperature 97.8 F (36.6 C), temperature source Oral, resp. rate 18, last menstrual period 08/02/2013, SpO2 95 %. There is no height or weight on file to calculate BMI.  ASSESSMENT Salli Derick is a 61 year old female with history of depression, bipolar disorder, alcohol use disorder, anxiety presenting to [facility] for alcohol detox and residential substance treatment placement.  PLAN Alcohol Use Disorder Bipolar Disorder Does not appear to be acutely  withdrawing -Ativan taper, will extend to taper if patient has severe alcohol withdrawal -CIWA protocol with Ativan -Patient will consider naltrexone -Seroquel 100 twice daily and 200 at night -Gabapentin 600 3 times daily for anxiety and alcohol craving -Continue Prozac 40 mg for depression symptoms -Trazodone 150 mg nightly as needed for insomnia -Imodium, milk of magnesia, ondansetron, Maalox, Tylenol are available as needed for patient's alcohol withdrawal -MVI/thiamine  GERD -Continue Protonix 40 mg daily  Dispo residential substance use treatment once completed alcohol detox   France Ravens, MD 04/09/2022 12:04 PM

## 2022-04-09 NOTE — ED Notes (Signed)
Patient attended group the group theme was Self Actualization achieving one's full potential, self esteem needs, belonging and love needs, safety needs, physiological needs. We discussed food, shelter, sleep, clothing, friendship,family, sense of connection, personal security, resources, respect, self-esteem, feeling of accomplishment, recognition. We talked about choosing a valued life along with a worksheet, being emotionally stable, being independent, appreciated, having self control,being healthy, having deep feelings, being physically safe around family and others, having self acceptance, and loving yourself, before loving someone else. How to avoid being bored, growing as a person having a purpose in life, I gave the patients worksheets to work on, in their free time, they were very much involved in the conversation. Everyone was there except for one person she did want to participate. The meeting lasted an hour.

## 2022-04-09 NOTE — ED Notes (Signed)
Pt Aox4, reports to this writer she will only be taking two of her medications this morning, Prozac and Hydrochlorothiazide.  Pt also requested to speak with provider stating, "I need to get my medications straight."  Reports mood as worried because she wants to go on to further treatment but she has things to do at home. Breathing is even and unlabored.  Will continue to monitor for safety.

## 2022-04-09 NOTE — ED Notes (Signed)
Pt is in the bed sleeping. Respirations are even and unlabored. No acute distress noted. Will continue to monitor for safety. 

## 2022-04-09 NOTE — BH IP Treatment Plan (Signed)
Interdisciplinary Treatment and Diagnostic Plan Update  04/09/2022 Time of Session: 11:00AM  Kristina Huffman MRN: [id number]  Diagnosis:  Final diagnoses:  Alcohol use disorder, severe, dependence ([place])  Suicidal ideation  Bipolar I disorder, most recent episode depressed ([place])     Current Medications:  Current Facility-Administered Medications  Medication Dose Route Frequency Provider Last Rate Last Admin   acetaminophen (TYLENOL) tablet 650 mg  650 mg Oral Q6H PRN Ajibola, Ene A, NP   650 mg at 04/07/22 0534   alum & mag hydroxide-simeth (MAALOX/MYLANTA) 200-200-20 MG/5ML suspension 30 mL  30 mL Oral Q4H PRN Ajibola, Ene A, NP       FLUoxetine (PROZAC) capsule 40 mg  40 mg Oral Daily Ajibola, Ene A, NP   40 mg at 04/09/22 9702   gabapentin (NEURONTIN) capsule 600 mg  600 mg Oral TID Ajibola, Ene A, NP   600 mg at 04/08/22 2127   hydrOXYzine (ATARAX) tablet 25 mg  25 mg Oral Q6H PRN Ajibola, Ene A, NP       loperamide (IMODIUM) capsule 2-4 mg  2-4 mg Oral PRN Ajibola, Ene A, NP       LORazepam (ATIVAN) tablet 1 mg  1 mg Oral BID France Ravens, MD   1 mg at 04/08/22 1041   Followed by   LORazepam (ATIVAN) tablet 0.5 mg  0.5 mg Oral BID France Ravens, MD       LORazepam (ATIVAN) tablet 1 mg  1 mg Oral Q6H PRN Ajibola, Ene A, NP   1 mg at 04/07/22 2129   magnesium hydroxide (MILK OF MAGNESIA) suspension 30 mL  30 mL Oral Daily PRN Ajibola, Ene A, NP       multivitamin with minerals tablet 1 tablet  1 tablet Oral Daily Ajibola, Ene A, NP   1 tablet at 04/08/22 1044   ondansetron (ZOFRAN-ODT) disintegrating tablet 4 mg  4 mg Oral Q6H PRN Ajibola, Ene A, NP   4 mg at 04/07/22 0918   pantoprazole (PROTONIX) EC tablet 40 mg  40 mg Oral Daily Ajibola, Ene A, NP   40 mg at 04/08/22 1041   QUEtiapine (SEROQUEL) tablet 100 mg  100 mg Oral BID Ajibola, Ene A, NP   100 mg at 04/08/22 0802   QUEtiapine (SEROQUEL) tablet 200 mg  200 mg Oral QHS Ajibola, Ene A, NP   200 mg at 04/08/22 2126   thiamine  tablet 100 mg  100 mg Oral Daily Ajibola, Ene A, NP   100 mg at 04/08/22 1042   traZODone (DESYREL) tablet 50 mg  50 mg Oral QHS PRN Onuoha, Chinwendu V, NP   50 mg at 04/08/22 2222   Current Outpatient Medications  Medication Sig Dispense Refill   Cholecalciferol (VITAMIN D3) 50 MCG (2000 UT) TABS Take 2,000 mcg by mouth daily.     colchicine 0.6 MG tablet Take 0.6 mg by mouth daily as needed (For gout).     FLUoxetine (PROZAC) 40 MG capsule Take 1 capsule (40 mg total) by mouth daily. 30 capsule 0   gabapentin (NEURONTIN) 300 MG capsule Take 300 mg by mouth 3 (three) times daily.     hydrochlorothiazide (HYDRODIURIL) 25 MG tablet Take 1 tablet (25 mg total) by mouth daily. 30 tablet 0   ibuprofen (ADVIL) 200 MG tablet Take 400 mg by mouth every 6 (six) hours as needed for headache or mild pain.     neomycin-bacitracin-polymyxin (NEOSPORIN) [phone] ointment Apply 1 Application topically 4 (four) times daily as needed (  For infection on toe).     Omega-3 Fatty Acids (FISH OIL) 1200 MG CAPS Take 1,200 mg by mouth at bedtime.     ondansetron (ZOFRAN) 4 MG tablet Take 4 mg by mouth every 6 (six) hours as needed for nausea or vomiting.     pantoprazole (PROTONIX) 40 MG tablet Take 1 tablet (40 mg total) by mouth daily. (Patient taking differently: Take 40 mg by mouth daily as needed (For heartburn.).) 30 tablet 11   QUEtiapine (SEROQUEL) 100 MG tablet Take 1 tablet (100 mg total) by mouth 2 (two) times daily. (Patient taking differently: Take 100-200 mg by mouth in the morning, at noon, and at bedtime. Take one tablet twice daily and two tablets at bedtime.) 60 tablet 0   traZODone (DESYREL) 100 MG tablet Take 100 mg by mouth at bedtime.     PTA Medications: Prior to Admission medications   Medication Sig Start Date End Date Taking? Authorizing Provider  Cholecalciferol (VITAMIN D3) 50 MCG (2000 UT) TABS Take 2,000 mcg by mouth daily.   Yes [provider]  colchicine 0.6 MG tablet Take  0.6 mg by mouth daily as needed (For gout).   Yes [provider]  FLUoxetine (PROZAC) 40 MG capsule Take 1 capsule (40 mg total) by mouth daily. 09/24/20  Yes Money, Lowry Ram, FNP  gabapentin (NEURONTIN) 300 MG capsule Take 300 mg by mouth 3 (three) times daily.   Yes [provider]  hydrochlorothiazide (HYDRODIURIL) 25 MG tablet Take 1 tablet (25 mg total) by mouth daily. 09/24/20  Yes Money, Lowry Ram, FNP  ibuprofen (ADVIL) 200 MG tablet Take 400 mg by mouth every 6 (six) hours as needed for headache or mild pain.   Yes [provider]  neomycin-bacitracin-polymyxin (NEOSPORIN) [phone] ointment Apply 1 Application topically 4 (four) times daily as needed (For infection on toe).   Yes [provider]  Omega-3 Fatty Acids (FISH OIL) 1200 MG CAPS Take 1,200 mg by mouth at bedtime.   Yes [provider]  ondansetron (ZOFRAN) 4 MG tablet Take 4 mg by mouth every 6 (six) hours as needed for nausea or vomiting.   Yes [provider]  pantoprazole (PROTONIX) 40 MG tablet Take 1 tablet (40 mg total) by mouth daily. Patient taking differently: Take 40 mg by mouth daily as needed (For heartburn.). 10/07/21  Yes Ladene Artist, MD  QUEtiapine (SEROQUEL) 100 MG tablet Take 1 tablet (100 mg total) by mouth 2 (two) times daily. Patient taking differently: Take 100-200 mg by mouth in the morning, at noon, and at bedtime. Take one tablet twice daily and two tablets at bedtime. 09/24/20  Yes Money, Lowry Ram, FNP  traZODone (DESYREL) 100 MG tablet Take 100 mg by mouth at bedtime. 03/19/22  Yes [provider]  metFORMIN (GLUCOPHAGE) 500 MG tablet Take 1 tablet (500 mg total) by mouth 2 (two) times daily with a meal. For diabetes management 06/09/18 11/30/19  Encarnacion Slates, NP    Patient Stressors: Loss of son; Grief; Son passed away 10 years ago   Substance abuse    Patient Strengths: Ability for insight  Motivation for treatment/growth   Treatment  Modalities: Medication Management, Group therapy, Case management,  1 to 1 session with clinician, Psychoeducation, Recreational therapy.   Physician Treatment Plan for Primary and Secondary Diagnosis:  Final diagnoses:  Alcohol use disorder, severe, dependence ([place])  Suicidal ideation  Bipolar I disorder, most recent episode depressed ([place])   Long Term Goal(s): Improvement  in symptoms so as ready for discharge  Short Term Goals: Patient will verbalize feelings in meetings with treatment team members. Patient will attend at least of 50% of the groups daily. Pt will complete the PHQ9 on admission, day 3 and discharge. Patient will participate in completing the [place] Patient will score a low risk of violence for 24 hours prior to discharge Patient will take medications as prescribed daily.  Medication Management: Evaluate patient's response, side effects, and tolerance of medication regimen.  Therapeutic Interventions: 1 to 1 sessions, Unit Group sessions and Medication administration.  Evaluation of Outcomes: Adequate for Discharge  LCSW Treatment Plan for Primary Diagnosis:  Final diagnoses:  Alcohol use disorder, severe, dependence ([place])  Suicidal ideation  Bipolar I disorder, most recent episode depressed ([place])    Long Term Goal(s): Safe transition to appropriate next level of care at discharge.  Short Term Goals: Facilitate acceptance of mental health diagnosis and concerns through verbal commitment to aftercare plan and appointments at discharge. and Identify minimum of 2 triggers associated with mental health/substance abuse issues with treatment team members.  Therapeutic Interventions: Assess for all discharge needs, 1 to 1 time with Education officer, museum, Explore available resources and support systems, Assess for adequacy in community support network, Educate family and significant other(s) on suicide prevention, Complete Psychosocial Assessment,  Interpersonal group therapy.  Evaluation of Outcomes: Adequate for Discharge   Progress in Treatment: Attending groups: Yes. Participating in groups: Yes. Taking medication as prescribed: Yes. Toleration medication: Yes. Family/Significant other contact made: No, will contact:  no one at this time Patient understands diagnosis: Yes. Discussing patient identified problems/goals with staff: Yes. Medical problems stabilized or resolved: Yes. Denies suicidal/homicidal ideation: Yes. Issues/concerns per patient self-inventory: No. Other: None   New problem(s) identified: No, Describe:  None   New Short Term/Long Term Goal(s): Hermie reports her short term goal is to complete a residential treatment program for her alcohol use.   Patient Goals:  "I really needed to get into treatment for my drinking problem"  Discharge Plan or Barriers: Essynce plans to discharge home temporarily/ or to [place] on Monday, 04/14/2022. Charnelle will receive outpatient resources for psychiatry and substance abuse services.   Reason for Continuation of Hospitalization: Medication stabilization  Estimated Length of Stay:  Last 3 [place] Suicide Severity Risk Score: [place] ED from 04/06/2022 in [facility] ED from 11/13/2021 in [facility] ED from 04/06/2021 in [place] High Risk No Risk Error: Question 1 not populated       Last [facility] 2/9 Scores:    11/12/2020   11:49 AM 03/18/2017    9:03 AM 06/04/2016    2:09 PM  Depression screen PHQ 2/9  Decreased Interest 2 3 0  Down, Depressed, Hopeless 1 2 0  PHQ - 2 Score 3 5 0  Altered sleeping 0 3   Tired, decreased energy 1 2   Change in appetite 1 3   Feeling bad or failure about yourself  0 3   Trouble concentrating 0 1   Moving slowly or fidgety/restless 0 1   Suicidal thoughts 0 0   PHQ-9 Score 5 18      Scribe for Treatment Team: Marylee Floras, LCSW 04/09/2022 9:40 AM

## 2022-04-09 NOTE — Clinical Social Work Psych Note (Signed)
LCSW Update Note

## 2022-08-18 ENCOUNTER — Emergency Department (HOSPITAL_COMMUNITY)
Admission: EM | Admit: 2022-08-18 | Discharge: 2022-08-18 | Discharge status: Against Medical Advice | Payer: Medicaid Other | Attending: Emergency Medicine | Admitting: Emergency Medicine

## 2022-08-18 ENCOUNTER — Emergency Department (HOSPITAL_COMMUNITY): Payer: Medicaid Other

## 2022-08-18 ENCOUNTER — Other Ambulatory Visit: Payer: Self-pay

## 2022-08-18 ENCOUNTER — Encounter (HOSPITAL_COMMUNITY): Payer: Self-pay | Admitting: *Deleted

## 2022-08-18 DIAGNOSIS — Z5321 Procedure and treatment not carried out due to patient leaving prior to being seen by health care provider: Secondary | ICD-10-CM | POA: Insufficient documentation

## 2022-08-18 DIAGNOSIS — R519 Headache, unspecified: Secondary | ICD-10-CM | POA: Diagnosis present

## 2022-08-18 LAB — CBC
HCT: 42 % (ref 36.0–46.0)
Hemoglobin: 13.8 g/dL (ref 12.0–15.0)
MCH: 30.9 pg (ref 26.0–34.0)
MCHC: 32.9 g/dL (ref 30.0–36.0)
MCV: 94.2 fL (ref 80.0–100.0)
Platelets: 205 10*3/uL (ref 150–400)
RBC: 4.46 MIL/uL (ref 3.87–5.11)
RDW: 13 % (ref 11.5–15.5)
WBC: 5.1 10*3/uL (ref 4.0–10.5)
nRBC: 0 % (ref 0.0–0.2)

## 2022-08-18 LAB — BASIC METABOLIC PANEL
Anion gap: 13 (ref 5–15)
BUN: 16 mg/dL (ref 6–20)
CO2: 22 mmol/L (ref 22–32)
Calcium: 9.6 mg/dL (ref 8.9–10.3)
Chloride: 101 mmol/L (ref 98–111)
Creatinine, Ser: 0.83 mg/dL (ref 0.44–1.00)
GFR, Estimated: >60 mL/min (ref >60)
Glucose, Bld: 121 mg/dL — ABNORMAL HIGH (ref 70–99)
Potassium: 4 mmol/L (ref 3.5–5.1)
Sodium: 136 mmol/L (ref 135–145)

## 2022-08-18 MED ORDER — ACETAMINOPHEN 325 MG PO TABS: 650.0000 mg | ORAL_TABLET | Freq: Once | ORAL | Status: DC

## 2022-08-18 NOTE — ED Notes (Signed)
Pt leaving ED d/t long wait times, encouraged to stay, pt refused.

## 2022-08-18 NOTE — ED Provider Triage Note (Signed)
Emergency Medicine Provider Triage Evaluation Note  Carita Sollars , a 61 y.o. female  was evaluated in triage.  Pt complains of headache. Started two months ago. Pain is primarily located in the left temple. Now the pain is in her jaw and left arm. The pain is sharp. Denies visual disturbance, AMS and fever. States this is the worse HA she has had in her life but stated she has never had a HA in her life so has nothing to compare it too. Denies gait ataxia, slurred speech and facial droop. States that at times she does have chest pain but that it comes and goes. Feels sharp and non radiating.    Review of Systems  Positive: See above Negative: See above  Physical Exam  BP (!) 128/95   Pulse 74   Temp 98.1 F (36.7 C) (Oral)   Resp 20   Ht '5\' 6"'$  (1.676 m)   Wt 97.5 kg   LMP 08/02/2013 Comment: irregular  SpO2 96%   BMI 34.70 kg/m  Gen:   Awake, no distress   Resp:  Normal effort  MSK:   Moves extremities without difficulty  Other:  No FND  Medical Decision Making  Medically screening exam initiated at 3:10 PM.  Appropriate orders placed.  Alexxis Farruggia was informed that the remainder of the evaluation will be completed by another provider, this initial triage assessment does not replace that evaluation, and the importance of remaining in the ED until their evaluation is complete.     Harriet Pho, [name] 08/18/22 1516

## 2022-08-18 NOTE — ED Triage Notes (Signed)
C/o head pressure 4 weeks ago also c/o left arm pain early today sometimes goes into her jaw. Also c/o left calf pain

## 2022-11-13 ENCOUNTER — Telehealth: Payer: Medicaid Other | Admitting: Nurse Practitioner

## 2022-11-13 ENCOUNTER — Other Ambulatory Visit: Payer: Self-pay | Admitting: Internal Medicine

## 2022-11-13 DIAGNOSIS — J40 Bronchitis, not specified as acute or chronic: Secondary | ICD-10-CM

## 2022-11-13 DIAGNOSIS — Z1231 Encounter for screening mammogram for malignant neoplasm of breast: Secondary | ICD-10-CM

## 2022-11-13 MED ORDER — ALBUTEROL SULFATE HFA 108 (90 BASE) MCG/ACT IN AERS: 2.0000 | INHALATION_SPRAY | Freq: Four times a day (QID) | RESPIRATORY_TRACT | 0 refills | Status: AC | PRN

## 2022-11-13 MED ORDER — AZITHROMYCIN 250 MG PO TABS: ORAL_TABLET | ORAL | 0 refills | Status: AC

## 2022-11-13 MED ORDER — BENZONATATE 100 MG PO CAPS: 100.0000 mg | ORAL_CAPSULE | Freq: Three times a day (TID) | ORAL | 0 refills | Status: DC | PRN

## 2022-11-13 NOTE — Progress Notes (Signed)
Virtual Visit Consent   Kristina Huffman, you are scheduled for a virtual visit with a [facility] provider today. Just as with appointments in the office, your consent must be obtained to participate. Your consent will be active for this visit and any virtual visit you may have with one of our providers in the next 365 days. If you have a MyChart account, a copy of this consent can be sent to you electronically.  As this is a virtual visit, video technology does not allow for your provider to perform a traditional examination. This may limit your provider's ability to fully assess your condition. If your provider identifies any concerns that need to be evaluated in person or the need to arrange testing (such as labs, EKG, etc.), we will make arrangements to do so. Although advances in technology are sophisticated, we cannot ensure that it will always work on either your end or our end. If the connection with a video visit is poor, the visit may have to be switched to a telephone visit. With either a video or telephone visit, we are not always able to ensure that we have a secure connection.  By engaging in this virtual visit, you consent to the provision of healthcare and authorize for your insurance to be billed (if applicable) for the services provided during this visit. Depending on your insurance coverage, you may receive a charge related to this service.  I need to obtain your verbal consent now. Are you willing to proceed with your visit today? Walida Logalbo has provided verbal consent on 11/13/2022 for a virtual visit (video or telephone). Apolonio Schneiders, FNP  Date: 11/13/2022 5:26 PM  Virtual Visit via Video Note   I, Apolonio Schneiders, connected with  Kristina Huffman  (IN:[id number], 03/16/1961) on 11/13/22 at  5:30 PM EST by a video-enabled telemedicine application and verified that I am speaking with the correct person using two identifiers.  Location: Patient: Virtual Visit Location Patient:  Home Provider: Virtual Visit Location Provider: Home Office   I discussed the limitations of evaluation and management by telemedicine and the availability of in person appointments. The patient expressed understanding and agreed to proceed.    History of Present Illness: Kristina Huffman is a 62 y.o. who identifies as a female who was assigned female at birth, and is being seen today for cough and chest congestion. Started with a sore throat and then became a cough.   She does have a history of asthma but does not currently have an inhaler - she typically would have Albuterol  She has had pneumonia in the past   She has been using Mucinex for relief  Denies a fever  She has increased her fluids feels her congestion going to her chest is similar to when she has developed pneumonia in the past   Problems:  Patient Active Problem List   Diagnosis Date Noted   Alcohol addiction ([place]) 04/07/2022   Obstructive sleep apnea syndrome 05/08/2020   Acute respiratory failure due to COVID-19 [facility]) 10/14/2019   ARF (acute renal failure) ([place]) 10/14/2019   Essential hypertension 10/14/2019   Controlled type 2 diabetes mellitus with hyperglycemia ([place]) 10/14/2019   Alcohol use disorder, severe, dependence ([place]) 06/06/2018   Deviated nasal septum 12/03/2017   Mixed hyperlipidemia 11/29/2017   Gastroesophageal reflux disease 10/06/2017   Chronic hepatitis C without hepatic coma ([place]) 06/04/2016   Substance induced mood disorder ([place]) 09/20/2013   Benzodiazepine dependence ([place]) 09/21/2011   Bipolar 1 disorder, mixed, moderate (  [place]) 09/21/2011   PTSD (post-traumatic stress disorder) 09/21/2011    Allergies:  Allergies  Allergen Reactions   Sulfa Antibiotics Shortness Of Breath, Swelling and Other (See Comments)    Tight in throat, facial swelling   Aspirin Other (See Comments)    "Ringing in ears"   Medications:  Current Outpatient Medications:    atorvastatin (LIPITOR) 40 MG tablet, Take 1  tablet (40 mg total) by mouth at bedtime., Disp: 30 tablet, Rfl: 0   Cholecalciferol (VITAMIN D3) 50 MCG (2000 UT) TABS, Take 2,000 mcg by mouth daily., Disp: 30 tablet, Rfl: 0   colchicine 0.6 MG tablet, Take 0.6 mg by mouth daily as needed (For gout)., Disp: , Rfl:    FLUoxetine (PROZAC) 40 MG capsule, Take 1 capsule (40 mg total) by mouth daily., Disp: 30 capsule, Rfl: 0   gabapentin (NEURONTIN) 300 MG capsule, Take 2 capsules (600 mg total) by mouth at bedtime., Disp: 60 capsule, Rfl: 0   hydrochlorothiazide (HYDRODIURIL) 25 MG tablet, Take 1 tablet (25 mg total) by mouth daily., Disp: 30 tablet, Rfl: 0   ibuprofen (ADVIL) 200 MG tablet, Take 400 mg by mouth every 6 (six) hours as needed for headache or mild pain., Disp: , Rfl:    neomycin-bacitracin-polymyxin (NEOSPORIN) [phone] ointment, Apply 1 Application topically 4 (four) times daily as needed (For infection on toe)., Disp: , Rfl:    ondansetron (ZOFRAN) 4 MG tablet, Take 4 mg by mouth every 6 (six) hours as needed for nausea or vomiting., Disp: , Rfl:    pantoprazole (PROTONIX) 40 MG tablet, Take 1 tablet (40 mg total) by mouth daily. (Patient taking differently: Take 40 mg by mouth daily as needed (For heartburn.).), Disp: 30 tablet, Rfl: 11   QUEtiapine (SEROQUEL) 100 MG tablet, Take 1 tablet (100 mg total) by mouth daily. Take 1 tablet at 5 PM by mouth daily, Disp: 30 tablet, Rfl: 0   QUEtiapine (SEROQUEL) 200 MG tablet, Take 1 tablet (200 mg total) by mouth at bedtime., Disp: 30 tablet, Rfl: 0   traZODone (DESYREL) 100 MG tablet, Take 1 tablet (100 mg total) by mouth at bedtime., Disp: 30 tablet, Rfl: 0  Observations/Objective: Patient is well-developed, well-nourished in no acute distress.  Resting comfortably  at home.  Head is normocephalic, atraumatic.  No labored breathing.  Speech is clear and coherent with logical content.  Patient is alert and oriented at baseline.    Assessment and Plan: 1. Bronchitis  - albuterol  (VENTOLIN HFA) 108 (90 Base) MCG/ACT inhaler; Inhale 2 puffs into the lungs every 6 (six) hours as needed for wheezing or shortness of breath.  Dispense: 8 g; Refill: 0 - azithromycin (ZITHROMAX) 250 MG tablet; Take 2 tablets on day 1, then 1 tablet daily on days 2 through 5  Dispense: 6 tablet; Refill: 0 - benzonatate (TESSALON) 100 MG capsule; Take 1 capsule (100 mg total) by mouth 3 (three) times daily as needed.  Dispense: 30 capsule; Refill: 0     Follow Up Instructions: I discussed the assessment and treatment plan with the patient. The patient was provided an opportunity to ask questions and all were answered. The patient agreed with the plan and demonstrated an understanding of the instructions.  A copy of instructions were sent to the patient via MyChart unless otherwise noted below.    The patient was advised to call back or seek an in-person evaluation if the symptoms worsen or if the condition fails to improve as anticipated.  Time:  I  spent 10 minutes with the patient via telehealth technology discussing the above problems/concerns.    Apolonio Schneiders, FNP

## 2022-11-17 ENCOUNTER — Other Ambulatory Visit: Payer: Self-pay | Admitting: Gastroenterology

## 2022-11-23 ENCOUNTER — Telehealth: Payer: Medicaid Other | Admitting: Family

## 2022-11-23 DIAGNOSIS — Z8739 Personal history of other diseases of the musculoskeletal system and connective tissue: Secondary | ICD-10-CM

## 2022-11-23 DIAGNOSIS — M79672 Pain in left foot: Secondary | ICD-10-CM | POA: Diagnosis not present

## 2022-11-23 NOTE — Progress Notes (Signed)
Virtual Visit Consent   Kristina Huffman, you are scheduled for a virtual visit with a [facility] provider today. Just as with appointments in the office, your consent must be obtained to participate. Your consent will be active for this visit and any virtual visit you may have with one of our providers in the next 365 days. If you have a MyChart account, a copy of this consent can be sent to you electronically.  As this is a virtual visit, video technology does not allow for your provider to perform a traditional examination. This may limit your provider's ability to fully assess your condition. If your provider identifies any concerns that need to be evaluated in person or the need to arrange testing (such as labs, EKG, etc.), we will make arrangements to do so. Although advances in technology are sophisticated, we cannot ensure that it will always work on either your end or our end. If the connection with a video visit is poor, the visit may have to be switched to a telephone visit. With either a video or telephone visit, we are not always able to ensure that we have a secure connection.  By engaging in this virtual visit, you consent to the provision of healthcare and authorize for your insurance to be billed (if applicable) for the services provided during this visit. Depending on your insurance coverage, you may receive a charge related to this service.  I need to obtain your verbal consent now. Are you willing to proceed with your visit today? Kristina Huffman has provided verbal consent on 11/23/2022 for a virtual visit (video or telephone). Evelina Dun, FNP  Date: 11/23/2022 2:31 PM  Virtual Visit via Video Note   I, Evelina Dun, connected with  Kristina Huffman  ([id number], Jan 21, 1961) on 11/23/22 at  2:30 PM EST by a video-enabled telemedicine application and verified that I am speaking with the correct person using two identifiers.  Location: Patient: Virtual Visit Location Patient:  Home Provider: Virtual Visit Location Provider: Home Office   I discussed the limitations of evaluation and management by telemedicine and the availability of in person appointments. The patient expressed understanding and agreed to proceed.    History of Present Illness: Kristina Huffman is a 62 y.o. who identifies as a female who was assigned female at birth, and is being seen today for left foot that she noticed two days ago after waking up. States the area has worsen with erythemas, tender, and swelling. She reports tenderness of 7 out 10.  Worried it might be a spider bite.  Has hx of gout.   HPI: HPI  Problems:  Patient Active Problem List   Diagnosis Date Noted   Alcohol addiction ([place]) 04/07/2022   Obstructive sleep apnea syndrome 05/08/2020   Acute respiratory failure due to COVID-19 [facility]) 10/14/2019   ARF (acute renal failure) ([place]) 10/14/2019   Essential hypertension 10/14/2019   Controlled type 2 diabetes mellitus with hyperglycemia ([place]) 10/14/2019   Alcohol use disorder, severe, dependence ([place]) 06/06/2018   Deviated nasal septum 12/03/2017   Mixed hyperlipidemia 11/29/2017   Gastroesophageal reflux disease 10/06/2017   Chronic hepatitis C without hepatic coma ([place]) 06/04/2016   Substance induced mood disorder ([place]) 09/20/2013   Benzodiazepine dependence ([place]) 09/21/2011   Bipolar 1 disorder, mixed, moderate ([place]) 09/21/2011   PTSD (post-traumatic stress disorder) 09/21/2011    Allergies:  Allergies  Allergen Reactions   Sulfa Antibiotics Shortness Of Breath, Swelling and Other (See Comments)    Tight in throat, facial  swelling   Aspirin Other (See Comments)    "Ringing in ears"   Medications:  Current Outpatient Medications:    albuterol (VENTOLIN HFA) 108 (90 Base) MCG/ACT inhaler, Inhale 2 puffs into the lungs every 6 (six) hours as needed for wheezing or shortness of breath., Disp: 8 g, Rfl: 0   atorvastatin (LIPITOR) 40 MG tablet, Take 1 tablet (40 mg total)  by mouth at bedtime., Disp: 30 tablet, Rfl: 0   Cholecalciferol (VITAMIN D3) 50 MCG (2000 UT) TABS, Take 2,000 mcg by mouth daily., Disp: 30 tablet, Rfl: 0   colchicine 0.6 MG tablet, Take 0.6 mg by mouth daily as needed (For gout)., Disp: , Rfl:    FLUoxetine (PROZAC) 40 MG capsule, Take 1 capsule (40 mg total) by mouth daily., Disp: 30 capsule, Rfl: 0   gabapentin (NEURONTIN) 300 MG capsule, Take 2 capsules (600 mg total) by mouth at bedtime., Disp: 60 capsule, Rfl: 0   hydrochlorothiazide (HYDRODIURIL) 25 MG tablet, Take 1 tablet (25 mg total) by mouth daily., Disp: 30 tablet, Rfl: 0   ibuprofen (ADVIL) 200 MG tablet, Take 400 mg by mouth every 6 (six) hours as needed for headache or mild pain., Disp: , Rfl:    neomycin-bacitracin-polymyxin (NEOSPORIN) [phone] ointment, Apply 1 Application topically 4 (four) times daily as needed (For infection on toe)., Disp: , Rfl:    ondansetron (ZOFRAN) 4 MG tablet, Take 4 mg by mouth every 6 (six) hours as needed for nausea or vomiting., Disp: , Rfl:    pantoprazole (PROTONIX) 40 MG tablet, Take 1 tablet (40 mg total) by mouth daily., Disp: 30 tablet, Rfl: 0   QUEtiapine (SEROQUEL) 100 MG tablet, Take 1 tablet (100 mg total) by mouth daily. Take 1 tablet at 5 PM by mouth daily, Disp: 30 tablet, Rfl: 0   QUEtiapine (SEROQUEL) 200 MG tablet, Take 1 tablet (200 mg total) by mouth at bedtime., Disp: 30 tablet, Rfl: 0   traZODone (DESYREL) 100 MG tablet, Take 1 tablet (100 mg total) by mouth at bedtime., Disp: 30 tablet, Rfl: 0  Observations/Objective: Patient is well-developed, well-nourished in no acute distress.  Resting comfortably  at home.  Head is normocephalic, atraumatic.  No labored breathing.  Speech is clear and coherent with logical content.  Patient is alert and oriented at baseline.  Redness and tenderness present in proximal fifth toe   Assessment and Plan: 1. Hx of gout  2. Left foot pain  PT will take colchicine  Report any  increase redness and pain Force fluids Follow up if symptoms worsen or do not improve   Follow Up Instructions: I discussed the assessment and treatment plan with the patient. The patient was provided an opportunity to ask questions and all were answered. The patient agreed with the plan and demonstrated an understanding of the instructions.  A copy of instructions were sent to the patient via MyChart unless otherwise noted below.     The patient was advised to call back or seek an in-person evaluation if the symptoms worsen or if the condition fails to improve as anticipated.  Time:  I spent 9 minutes with the patient via telehealth technology discussing the above problems/concerns.    Evelina Dun, FNP

## 2022-11-25 ENCOUNTER — Telehealth: Payer: Medicaid Other

## 2022-11-25 ENCOUNTER — Telehealth: Payer: Medicaid Other | Admitting: Physician Assistant

## 2022-11-25 DIAGNOSIS — M79672 Pain in left foot: Secondary | ICD-10-CM

## 2022-11-25 MED ORDER — CEPHALEXIN 500 MG PO CAPS: 500.0000 mg | ORAL_CAPSULE | Freq: Four times a day (QID) | ORAL | 0 refills | Status: AC

## 2022-11-25 NOTE — Patient Instructions (Signed)
Kristina Huffman, thank you for joining [employer], [name] for today's virtual visit.  While this provider is not your primary care provider (PCP), if your PCP is located in our provider database this encounter information will be shared with them immediately following your visit.   [name] account gives you access to today's visit and all your visits, tests, and labs performed at [facility] " click here if you don't have a [name] account or go to [email or web address]  Consent: (Patient) Kristina Huffman provided verbal consent for this virtual visit at the beginning of the encounter.  Current Medications:  Current Outpatient Medications:    cephALEXin (KEFLEX) 500 MG capsule, Take 1 capsule (500 mg total) by mouth 4 (four) times daily for 5 days., Disp: 20 capsule, Rfl: 0   albuterol (VENTOLIN HFA) 108 (90 Base) MCG/ACT inhaler, Inhale 2 puffs into the lungs every 6 (six) hours as needed for wheezing or shortness of breath., Disp: 8 g, Rfl: 0   atorvastatin (LIPITOR) 40 MG tablet, Take 1 tablet (40 mg total) by mouth at bedtime., Disp: 30 tablet, Rfl: 0   Cholecalciferol (VITAMIN D3) 50 MCG (2000 UT) TABS, Take 2,000 mcg by mouth daily., Disp: 30 tablet, Rfl: 0   colchicine 0.6 MG tablet, Take 0.6 mg by mouth daily as needed (For gout)., Disp: , Rfl:    FLUoxetine (PROZAC) 40 MG capsule, Take 1 capsule (40 mg total) by mouth daily., Disp: 30 capsule, Rfl: 0   gabapentin (NEURONTIN) 300 MG capsule, Take 2 capsules (600 mg total) by mouth at bedtime., Disp: 60 capsule, Rfl: 0   hydrochlorothiazide (HYDRODIURIL) 25 MG tablet, Take 1 tablet (25 mg total) by mouth daily., Disp: 30 tablet, Rfl: 0   ibuprofen (ADVIL) 200 MG tablet, Take 400 mg by mouth every 6 (six) hours as needed for headache or mild pain., Disp: , Rfl:    neomycin-bacitracin-polymyxin (NEOSPORIN) [phone] ointment, Apply 1 Application topically 4 (four) times daily as needed (For  infection on toe)., Disp: , Rfl:    ondansetron (ZOFRAN) 4 MG tablet, Take 4 mg by mouth every 6 (six) hours as needed for nausea or vomiting., Disp: , Rfl:    pantoprazole (PROTONIX) 40 MG tablet, Take 1 tablet (40 mg total) by mouth daily., Disp: 30 tablet, Rfl: 0   QUEtiapine (SEROQUEL) 100 MG tablet, Take 1 tablet (100 mg total) by mouth daily. Take 1 tablet at 5 PM by mouth daily, Disp: 30 tablet, Rfl: 0   QUEtiapine (SEROQUEL) 200 MG tablet, Take 1 tablet (200 mg total) by mouth at bedtime., Disp: 30 tablet, Rfl: 0   traZODone (DESYREL) 100 MG tablet, Take 1 tablet (100 mg total) by mouth at bedtime., Disp: 30 tablet, Rfl: 0   Medications ordered in this encounter:  Meds ordered this encounter  Medications   cephALEXin (KEFLEX) 500 MG capsule    Sig: Take 1 capsule (500 mg total) by mouth 4 (four) times daily for 5 days.    Dispense:  20 capsule    Refill:  0    Order Specific Question:   Supervising Provider    Answer:   Chase Picket [id number]     *If you need refills on other medications prior to your next appointment, please contact your pharmacy*  Follow-Up: Call back or seek an in-person evaluation if the symptoms worsen or if the condition fails to improve as anticipated.  [name] [phone]  Other Instructions Take antibiotic as  prescribed.  Can apply ice to affected area.  Recommend Tylenol or Ibuprofen. Keep follow up with PCP on Thursday.    If you have been instructed to have an in-person evaluation today at a local Urgent Care facility, please use the link below. It will take you to a list of all of our available [facility], including address, phone number and hours of operation. Please do not delay care.  [name]  If you or a family member do not have a primary care provider, use the link below to schedule a visit and establish care. When you choose a [facility] primary care physician or advanced practice  provider, you gain a long-term partner in health. Find a Primary Care Provider  Learn more about 's in-office and virtual care options: [facility]

## 2022-11-25 NOTE — Progress Notes (Signed)
Virtual Visit Consent   Kristina Huffman, you are scheduled for a virtual visit with a [facility] provider today. Just as with appointments in the office, your consent must be obtained to participate. Your consent will be active for this visit and any virtual visit you may have with one of our providers in the next 365 days. If you have a MyChart account, a copy of this consent can be sent to you electronically.  As this is a virtual visit, video technology does not allow for your provider to perform a traditional examination. This may limit your provider's ability to fully assess your condition. If your provider identifies any concerns that need to be evaluated in person or the need to arrange testing (such as labs, EKG, etc.), we will make arrangements to do so. Although advances in technology are sophisticated, we cannot ensure that it will always work on either your end or our end. If the connection with a video visit is poor, the visit may have to be switched to a telephone visit. With either a video or telephone visit, we are not always able to ensure that we have a secure connection.  By engaging in this virtual visit, you consent to the provision of healthcare and authorize for your insurance to be billed (if applicable) for the services provided during this visit. Depending on your insurance coverage, you may receive a charge related to this service.  I need to obtain your verbal consent now. Are you willing to proceed with your visit today? Kristina Huffman has provided verbal consent on 11/25/2022 for a virtual visit (video or telephone). Kristina Arena Ward, [name]  Date: 11/25/2022 7:22 PM  Virtual Visit via Video Note   I, Kristina Huffman, connected with  Kristina Huffman  ([id number], [birth year]) on 11/25/22 at  7:15 PM EST by a video-enabled telemedicine application and verified that I am speaking with the correct person using two identifiers.  Location: Patient: Virtual Visit Location Patient:  Home Provider: Virtual Visit Location Provider: Home Office   I discussed the limitations of evaluation and management by telemedicine and the availability of in person appointments. The patient expressed understanding and agreed to proceed.    History of Present Illness: Kristina Huffman is a 62 y.o. who identifies as a female who was assigned female at birth, and is being seen today for continued redness, swelling, and pain to the left foot.  She reports she has been taking colchicine with minimal relief.  Reports she has a h/o gout and this feels much worse.  She denies injury or trauma.  She denies fever, chills.  She denies increased redness or swelling over the last two days, just persistent sx.  She has an appointment with her PCP this Thursday.   HPI: HPI  Problems:  Patient Active Problem List   Diagnosis Date Noted   Alcohol addiction ([place]) 04/07/2022   Obstructive sleep apnea syndrome 05/08/2020   Acute respiratory failure due to COVID-19 [facility]) 10/14/2019   ARF (acute renal failure) ([place]) 10/14/2019   Essential hypertension 10/14/2019   Controlled type 2 diabetes mellitus with hyperglycemia ([place]) 10/14/2019   Alcohol use disorder, severe, dependence ([place]) 06/06/2018   Deviated nasal septum 12/03/2017   Mixed hyperlipidemia 11/29/2017   Gastroesophageal reflux disease 10/06/2017   Chronic hepatitis C without hepatic coma ([place]) 06/04/2016   Substance induced mood disorder ([place]) 09/20/2013   Benzodiazepine dependence ([place]) 09/21/2011   Bipolar 1 disorder, mixed, moderate ([place]) 09/21/2011   PTSD (post-traumatic stress disorder)  09/21/2011    Allergies:  Allergies  Allergen Reactions   Sulfa Antibiotics Shortness Of Breath, Swelling and Other (See Comments)    Tight in throat, facial swelling   Aspirin Other (See Comments)    "Ringing in ears"   Medications:  Current Outpatient Medications:    cephALEXin (KEFLEX) 500 MG capsule, Take 1 capsule (500 mg total) by mouth 4  (four) times daily for 5 days., Disp: 20 capsule, Rfl: 0   albuterol (VENTOLIN HFA) 108 (90 Base) MCG/ACT inhaler, Inhale 2 puffs into the lungs every 6 (six) hours as needed for wheezing or shortness of breath., Disp: 8 g, Rfl: 0   atorvastatin (LIPITOR) 40 MG tablet, Take 1 tablet (40 mg total) by mouth at bedtime., Disp: 30 tablet, Rfl: 0   Cholecalciferol (VITAMIN D3) 50 MCG (2000 UT) TABS, Take 2,000 mcg by mouth daily., Disp: 30 tablet, Rfl: 0   colchicine 0.6 MG tablet, Take 0.6 mg by mouth daily as needed (For gout)., Disp: , Rfl:    FLUoxetine (PROZAC) 40 MG capsule, Take 1 capsule (40 mg total) by mouth daily., Disp: 30 capsule, Rfl: 0   gabapentin (NEURONTIN) 300 MG capsule, Take 2 capsules (600 mg total) by mouth at bedtime., Disp: 60 capsule, Rfl: 0   hydrochlorothiazide (HYDRODIURIL) 25 MG tablet, Take 1 tablet (25 mg total) by mouth daily., Disp: 30 tablet, Rfl: 0   ibuprofen (ADVIL) 200 MG tablet, Take 400 mg by mouth every 6 (six) hours as needed for headache or mild pain., Disp: , Rfl:    neomycin-bacitracin-polymyxin (NEOSPORIN) [phone] ointment, Apply 1 Application topically 4 (four) times daily as needed (For infection on toe)., Disp: , Rfl:    ondansetron (ZOFRAN) 4 MG tablet, Take 4 mg by mouth every 6 (six) hours as needed for nausea or vomiting., Disp: , Rfl:    pantoprazole (PROTONIX) 40 MG tablet, Take 1 tablet (40 mg total) by mouth daily., Disp: 30 tablet, Rfl: 0   QUEtiapine (SEROQUEL) 100 MG tablet, Take 1 tablet (100 mg total) by mouth daily. Take 1 tablet at 5 PM by mouth daily, Disp: 30 tablet, Rfl: 0   QUEtiapine (SEROQUEL) 200 MG tablet, Take 1 tablet (200 mg total) by mouth at bedtime., Disp: 30 tablet, Rfl: 0   traZODone (DESYREL) 100 MG tablet, Take 1 tablet (100 mg total) by mouth at bedtime., Disp: 30 tablet, Rfl: 0  Observations/Objective: Patient is well-developed, well-nourished in no acute distress.  Resting comfortably  at home.  Head is  normocephalic, atraumatic.  No labored breathing.  Speech is clear and coherent with logical content.  Patient is alert and oriented at baseline.    Assessment and Plan: 1. Left foot pain - cephALEXin (KEFLEX) 500 MG capsule; Take 1 capsule (500 mg total) by mouth 4 (four) times daily for 5 days.  Dispense: 20 capsule; Refill: 0  Pt reports persistent pain, swelling, and redness. Minimal improvement with colchicine.  Reports much worse than typical gout flares.  Will cover for cellulitis.  Exam obviously limited due to VV.  She does have an upcoming appointment with PCP on Thursday.  Discussed importance of keeping this appointment for further evaluation.    Follow Up Instructions: I discussed the assessment and treatment plan with the patient. The patient was provided an opportunity to ask questions and all were answered. The patient agreed with the plan and demonstrated an understanding of the instructions.  A copy of instructions were sent to the patient via MyChart unless otherwise noted  below.   Patient has requested to receive PHI (AVS, Work Notes, etc) pertaining to this video visit through e-mail as they are currently without active [place]. They have voiced understand that email is not considered secure and their health information could be viewed by someone other than the patient.   The patient was advised to call back or seek an in-person evaluation if the symptoms worsen or if the condition fails to improve as anticipated.  Time:  I spent 12 minutes with the patient via telehealth technology discussing the above problems/concerns.    Kristina Arena Ward, [name]

## 2022-11-26 ENCOUNTER — Telehealth (HOSPITAL_COMMUNITY): Payer: Self-pay | Admitting: Licensed Clinical Social Worker

## 2022-11-26 NOTE — Telephone Encounter (Signed)
The therapist returns Kristina Huffman's call confirming her identifiers via two identifiers. She says that she wants to stop drinking and is interested in CD IOP. She says that she went to IOP before at the [facility] in 2022 without going to detox first and it was unsuccessful.  She notes that she wants to stop drinking as she is getting older. Also, she has a DUI from 2020 or 2021 so "has to do classes." Additionally, she lost custody of her daughter 10 years ago and says that there are "signs" that they "may be reconnected" so this motivates her to stop. She says that when she tries to stop drinking that she gets to "not feeling too great."   The therapist asks when she last drank with this being yesterday. She says that she consumed about three quarters of a bottle of wine and drank about the same before that. She says that she will sometimes go a week without drinking saying that the last time was probably a couple of weeks ago. When the therapist explains how people who drink sometimes minimize how much they drink, [name] chuckles. When the therapist explains the risks of stopping cold [place] if one has been drinking a long time daily, she says that when her brother quit cold [place] that he ended up having a stoke. [name] concludes that she will likely go to the [facility] to see detox before starting CD IOP suggesting that having not done so previously when she started the [facility]'s IOP may be the reason that she was not successful. Additionally, [name] is treated for Hypertension.   She questions if the [facility] will try to make her go to residential as this was the recommendation last time and she did not go. The therapist notes that the residential recommendation made more sense the last time as she was not stable from a psychiatric standpoint; however, presently, she denies any suicidal ideation and indicates that she is back on psychotropic medications with the content of her speech being logical and  coherent.  [name] says that she had a year of sobriety after attending treatment in 2015. The therapist inquires about [name] having had MAT in the past. She says that she took Naltrexone briefly and that one of her medical doctors tried her on Acamprosate. She has never been on Baclofen. She is also prescribed Gabapentin by one of her doctors.   [name] will go to the [facility] to be evaluated for detox and upon completion of detox wants to start the CD IOP at [facility]. She is to call this therapist back with additional questions on a p.r.n. basis.   Adam Phenix, [place], LCSW, [facility], [place] 11/26/2022

## 2022-12-02 ENCOUNTER — Telehealth (HOSPITAL_COMMUNITY): Payer: Self-pay | Admitting: Licensed Clinical Social Worker

## 2022-12-02 NOTE — Telephone Encounter (Signed)
The therapist returns Shateka's call leaving a HIPAA-compliant voicemail with his direct callback number.  Adam Phenix, [place], LCSW, [facility], [place] 12/02/2022

## 2022-12-11 ENCOUNTER — Other Ambulatory Visit: Payer: Self-pay | Admitting: Nurse Practitioner

## 2022-12-11 DIAGNOSIS — J40 Bronchitis, not specified as acute or chronic: Secondary | ICD-10-CM

## 2022-12-16 ENCOUNTER — Telehealth (HOSPITAL_COMMUNITY): Payer: Self-pay | Admitting: Licensed Clinical Social Worker

## 2022-12-16 NOTE — Telephone Encounter (Signed)
The therapist returns Kristina Huffman's call confirming her identity via two identifiers. She says that she does not believe that she is in need of detox as she is"doing well" in that she last drank alcohol four days ago and the week before this drank twice a week consuming "maybe two glasses."  She says that she did a DWI assessment over a year ago but has to get a new one; however, she does not have the $100 it would cost to get this done.   She requests to schedule an assessment with this therapist to meet with her on 12/24/22 at 1 p.m.  Adam Phenix, [place], LCSW, [facility], [place] 12/16/2022

## 2022-12-24 ENCOUNTER — Ambulatory Visit (HOSPITAL_COMMUNITY): Payer: Medicaid Other | Admitting: Licensed Clinical Social Worker

## 2022-12-24 ENCOUNTER — Encounter (HOSPITAL_COMMUNITY): Payer: Self-pay | Admitting: Licensed Clinical Social Worker

## 2022-12-24 ENCOUNTER — Encounter (HOSPITAL_COMMUNITY): Payer: Self-pay

## 2022-12-25 ENCOUNTER — Encounter: Payer: Self-pay | Admitting: Obstetrics & Gynecology

## 2022-12-25 ENCOUNTER — Ambulatory Visit (INDEPENDENT_AMBULATORY_CARE_PROVIDER_SITE_OTHER): Payer: Medicaid Other | Admitting: Obstetrics & Gynecology

## 2022-12-25 ENCOUNTER — Other Ambulatory Visit: Payer: Self-pay

## 2022-12-25 VITALS — Ht 66.0 in | Wt 233.0 lb

## 2022-12-25 DIAGNOSIS — Z01419 Encounter for gynecological examination (general) (routine) without abnormal findings: Secondary | ICD-10-CM

## 2022-12-25 NOTE — Progress Notes (Signed)
RN Adonis Huguenin D.) pulled back patient to room for check-in. RN was reviewing medical history, including OB hx, medical hx, and current medications. Patient became upset when asked about certain parts of history and medications (including when asked about G/P's, history of heroin abuse, and if patient took prescribed valtrex). RN offered patient another nurse to finish check in, and patient agreed. As RN was leaving, patient called RN a "b*tch".   RN notified Surveyor, quantity of clinic Yvetta Coder H.) about situation, as well as clinical team leader RN Mel Almond B.)--clinical team leader offered to takeover visit with patient. When clinical team lead RN went in to continue visit, patient was not found in room.   Adonis Huguenin RN on 12/25/22 at [phone]

## 2022-12-29 ENCOUNTER — Ambulatory Visit: Payer: Medicaid Other

## 2022-12-30 NOTE — Progress Notes (Signed)
Patient was assessed and managed by nursing staff during this encounter. I have reviewed the chart and agree with the documentation and plan. I have also made any necessary editorial changes.  Scheryl Darter, MD 12/30/2022 6:29 AM

## 2023-01-02 ENCOUNTER — Inpatient Hospital Stay: Admission: RE | Admit: 2023-01-02 | Payer: Medicaid Other | Source: Ambulatory Visit

## 2023-01-06 ENCOUNTER — Ambulatory Visit (HOSPITAL_COMMUNITY): Payer: Medicaid Other | Admitting: Licensed Clinical Social Worker

## 2023-01-08 ENCOUNTER — Ambulatory Visit (INDEPENDENT_AMBULATORY_CARE_PROVIDER_SITE_OTHER): Payer: Medicaid Other | Admitting: Licensed Clinical Social Worker

## 2023-01-08 DIAGNOSIS — F4310 Post-traumatic stress disorder, unspecified: Secondary | ICD-10-CM | POA: Diagnosis not present

## 2023-01-08 DIAGNOSIS — F1020 Alcohol dependence, uncomplicated: Secondary | ICD-10-CM

## 2023-01-08 DIAGNOSIS — F3132 Bipolar disorder, current episode depressed, moderate: Secondary | ICD-10-CM

## 2023-01-08 NOTE — Progress Notes (Signed)
THERAPIST PROGRESS NOTE  Session Time: 3 p.m. to 4:10 p.m.   Type of Therapy: Individual   Therapist Response/Interventions: CBT/The therapist completes her Treatment Plan and educates her on why addiction is a disease versus a character defect. He explains to her the concept of being smart versus strong. The therapist explains to her that she can attend AA and not speak and could get a Sponsor such that she could talk one-on-one versus in a group. She notes that her previous Sponsor suggested that she needed a therapist when she talked about her son's death, etcetera.   Treatment Goals addressed:  Active     Substance Use and Bipolar Disorder and PTSD      Kristina Huffman will abstain from alcohol per self-report and random UDS or breathalyzer while building sober supports.  (Initial)     Start:  01/09/23    Expected End:  07/10/23          Jaydon's PHQ-9 and GAD-7 will both be a 4 or less.  (Initial)     Start:  01/09/23    Expected End:  07/10/23            Summary: This is this therapist's initial meeting with Kristina Huffman. She apologizes that it has taken her this long to finally get in for this appointment. She says that she has a Therapist, sports at [facility] with whom she has been meeting for the past 3-4 years; however, she indicates that she does not want information shared with this provider. She takes Gabapentin, Seroquel, Prozac, and Trazodone.   She wants help with her alcohol use problem but also has a problem with Klonepin from 2011-2012 but tried to overdose on them so was  taken off them. She admits to drink a half bottle of wine yesterday noting that she had stopped for about five days prior to this. She was in detox at the [facility] last year; however, she was there for depression and suicidal ideation as well.   Kristina Huffman says that her longest sobriety was for one year in 2018. At the time, she  was in a women's program in [place] that was Christian-based. She was in their  residential program for 9 months of this year of sobriety. Her longest sobriety while not in a controlled setting was back in 2000 when she started going to [place] more. She admits that she is not a fan of AA meetings as it is hard for her to share in a large group of people due to a fear of being judged.  When asked what triggered her relapses after both periods of sobriety, she says that he trigger in 2018 was her father passing away. Her relapse in 2000 or 2001 she attributes to working nights and having her sleep disrupted. When asked what ended her most recent 5 days of sobriety, she attributes this to "loneliness" as she tends to isolate in her home. She notes that she has had problems with social anxiety since she was a teen.   Progress Towards Goals: Initial  Suicidal/Homicidal: No SI or HI  Plan: Return again in 1 weeks. She wants to start [facility]; however, the therapist informs her that the IOP is full at the moment but that there will be discharges next week.   Diagnosis: Alcohol Use Disorder, Severe; PTSD; and Bipolar Disorder per history  Collaboration of Care: Other N/A  Patient/Guardian was advised Release of Information must be obtained prior to any record release in order to collaborate  their care with an outside provider. Patient/Guardian was advised if they have not already done so to contact the registration department to sign all necessary forms in order for [place] to release information regarding their care.   Consent: Patient/Guardian gives verbal consent for treatment and assignment of benefits for services provided during this visit. Patient/Guardian expressed understanding and agreed to proceed.   Myrna Blazer, MA, LCSW, [facility], LCAS 01/08/2023

## 2023-01-09 ENCOUNTER — Encounter (HOSPITAL_COMMUNITY): Payer: Self-pay

## 2023-01-14 ENCOUNTER — Ambulatory Visit: Payer: Medicaid Other | Admitting: Obstetrics & Gynecology

## 2023-01-14 ENCOUNTER — Other Ambulatory Visit: Payer: Self-pay

## 2023-01-15 ENCOUNTER — Ambulatory Visit (HOSPITAL_COMMUNITY): Payer: Medicaid Other | Admitting: Licensed Clinical Social Worker

## 2023-01-18 IMAGING — MR MR ABDOMEN WO/W CM
12 of 17 series · 26 of 48 positions shown · IV contrast (20 ml multihance)
Comparison: Abdominal ultrasound 10/17/2021, CT abdomen and pelvis
04/11/2020, MRI abdomen 12/03/2017

CLINICAL DATA: Abnormal ultrasound, hepatic lesion evaluation

EXAM:
MRI ABDOMEN WITHOUT AND WITH CONTRAST
TECHNIQUE: Multiplanar multisequence MR imaging of the abdomen was performed
both before and after the administration of intravenous contrast.
CONTRAST:  20mL MULTIHANCE GADOBENATE DIMEGLUMINE 529 MG/ML IV SOLN

[Series 3: T2 · coronal · 5.0mm · 1.56mm/px · 1 of 33 slices shown (1 of 3)]
[im 1/33]
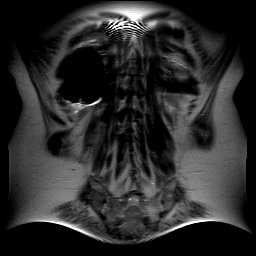

[Series 4: axial tru fisp · axial · 5.0mm · 1.48mm/px · 1 of 41 slices shown]
[im 1/41]
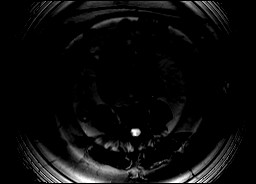

[Series 5: T2 · axial · 6.5mm · 0.74mm/px · 1 of 33 slices shown (2 of 3)]
[im 1/33]
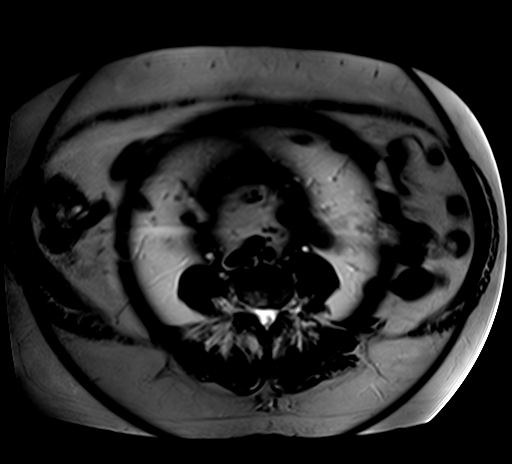

[Series 6: ep2d_diff_b50_500_800_p2 · axial · 6.0mm · 1.98mm/px · z∈[-55,+195]mm · 3 of 99 slices shown]
[im 1/99]
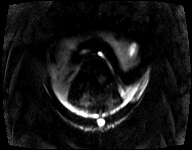
[im 50/99]
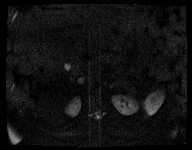
[im 99/99]
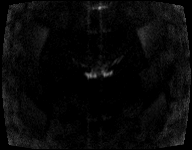

[Series 7: ep2d_[id number]_500_800_p2_adc · axial · 6.0mm · 1.98mm/px · 1 of 33 slices shown]
[im 1/33]
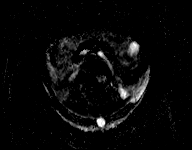

[Series 8: T2 · axial · 5.0mm · 1.48mm/px · 1 of 38 slices shown (3 of 3)]
[im 1/38]
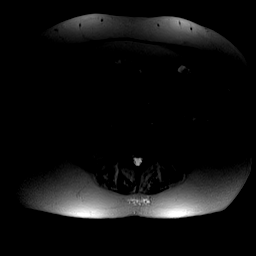

[Series 9: axial in out · axial · 6.0mm · 0.74mm/px · z∈[-71,+163]mm · 2 of 70 slices shown]
[im 1/70]
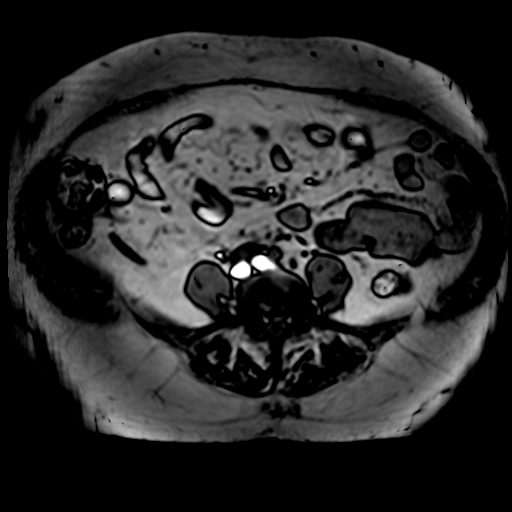
[im 70/70]
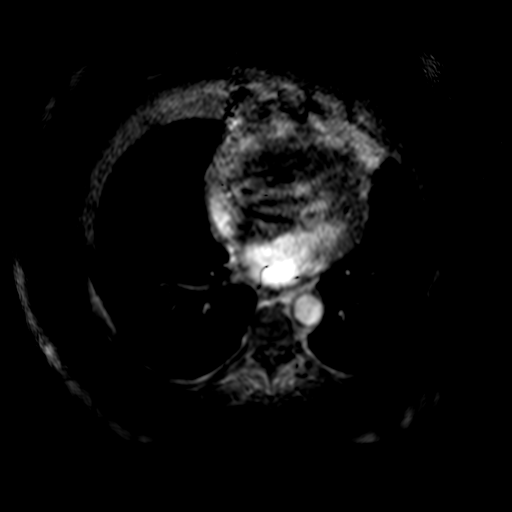

[Series 10: T1 dynamic · axial · non-contrast · 2.2mm · 0.78mm/px · z∈[-49,+160]mm · 3 of 96 slices shown]
[im 1/96]
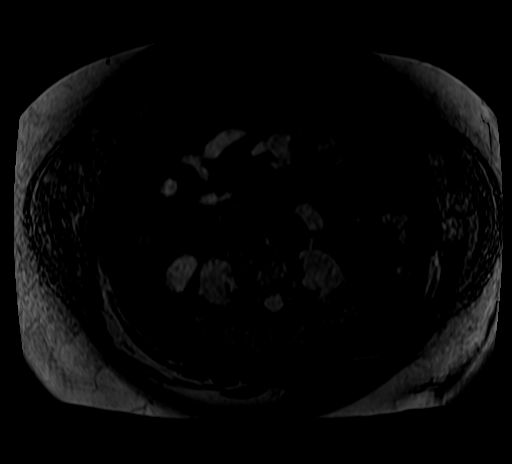
[im 48/96]
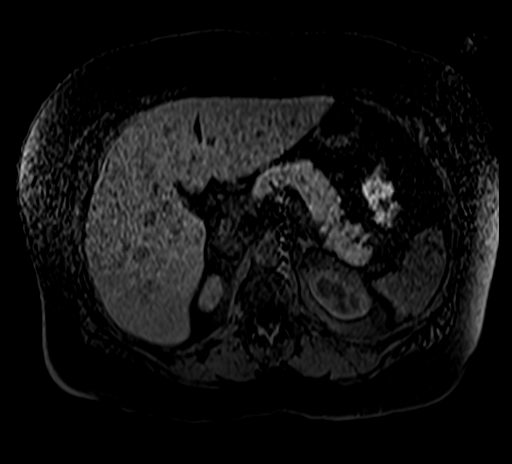
[im 96/96]
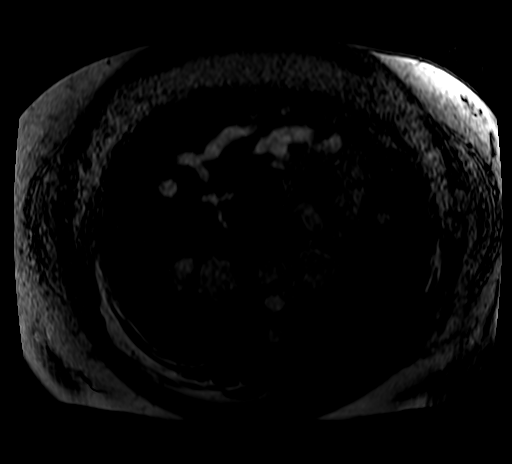

[Series 11: post 25 sec · axial · 2.2mm · 0.78mm/px · z∈[-49,+160]mm · 4 of 96 slices shown]
[im 1/96]
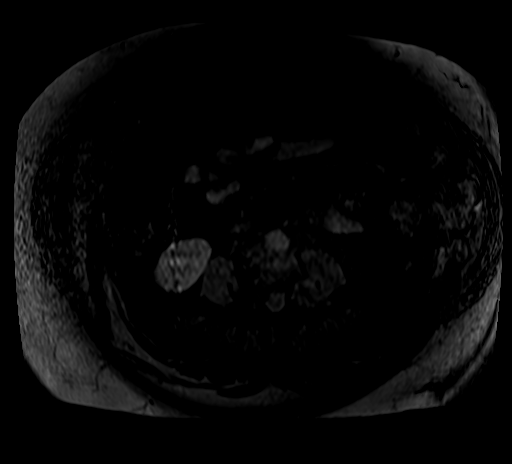
[im 32/96]
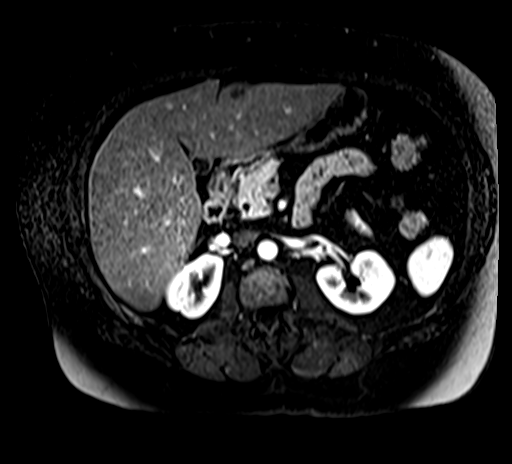
[im 64/96]
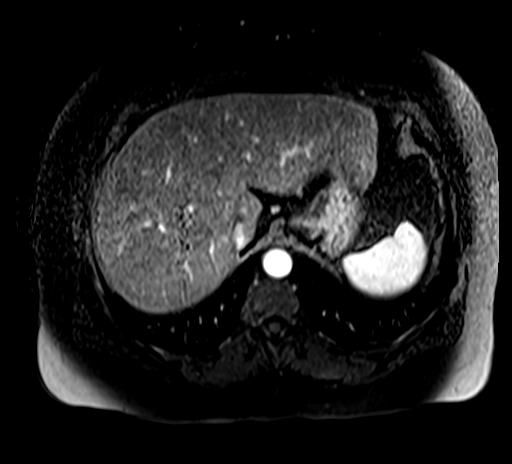
[im 96/96]
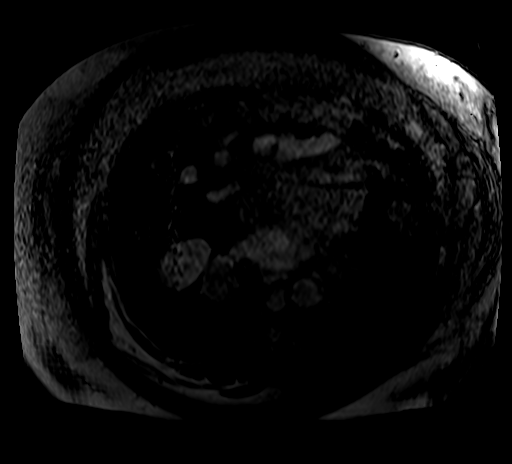

[Series 12: post 25 sec_sub · axial · 2.2mm · 0.78mm/px · z∈[-49,+160]mm · 4 of 96 slices shown]
[im 1/96]
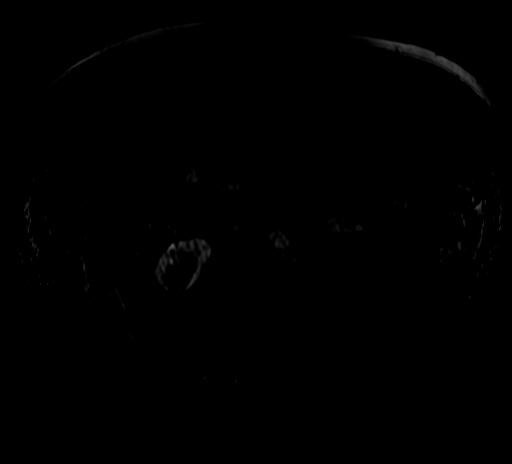
[im 32/96]
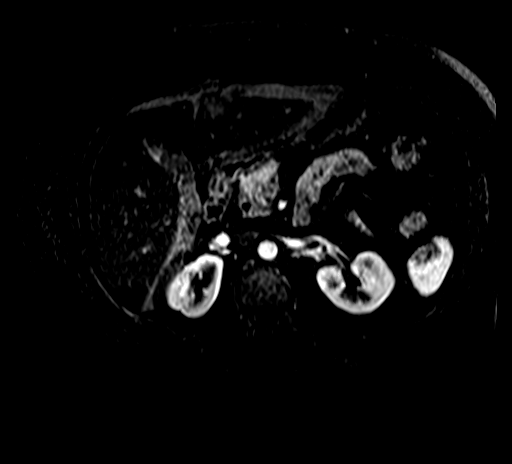
[im 64/96]
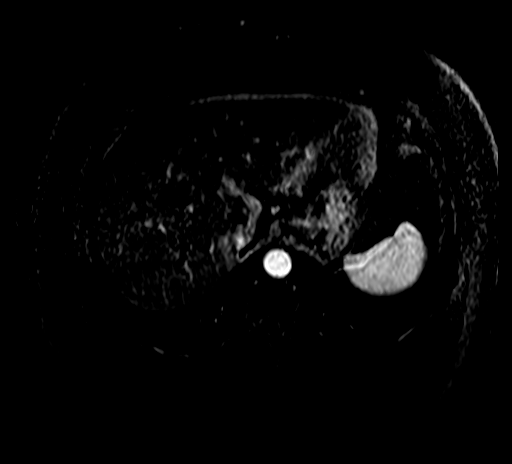
[im 96/96]
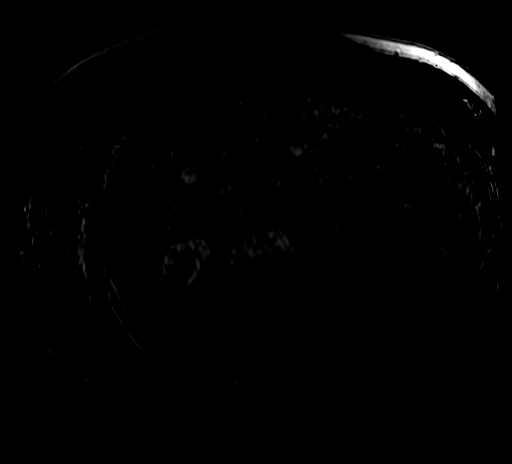

[Series 13: post 45 sec · axial · 2.2mm · 0.78mm/px · z∈[-49,+160]mm · 4 of 96 slices shown]
[im 1/96]
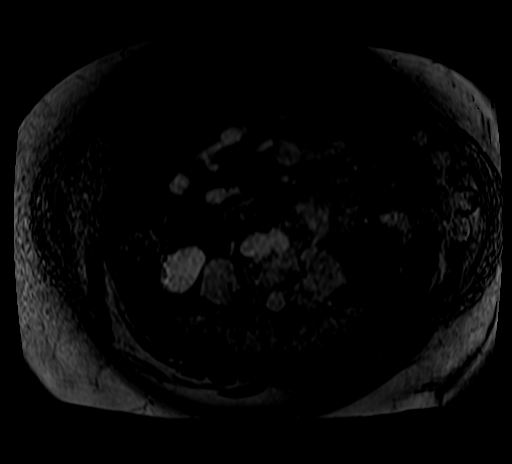
[im 32/96]
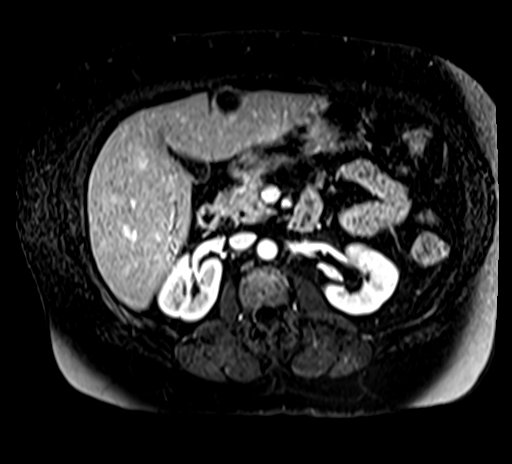
[im 64/96]
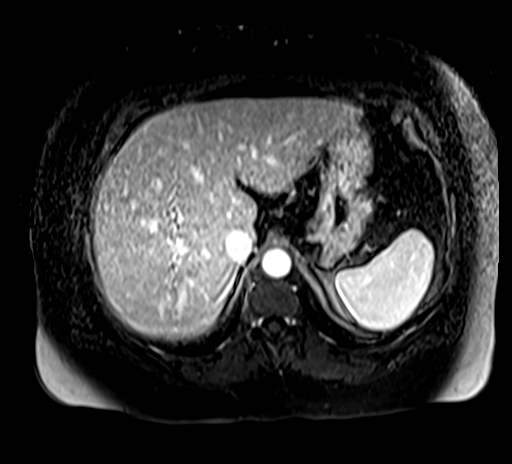
[im 96/96]
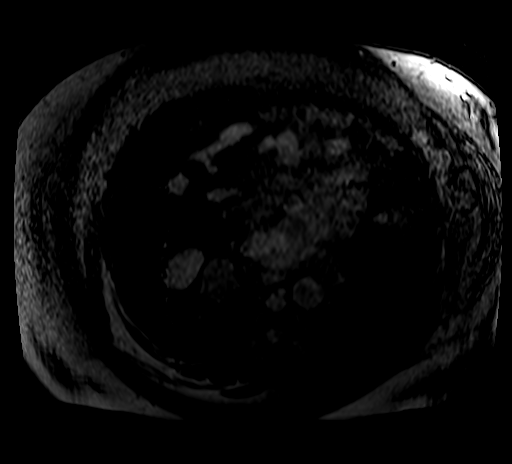

[Series 14: post 45 sec_sub · axial · 2.2mm · 0.78mm/px · 1 of 96 slices shown]
[im 1/96]
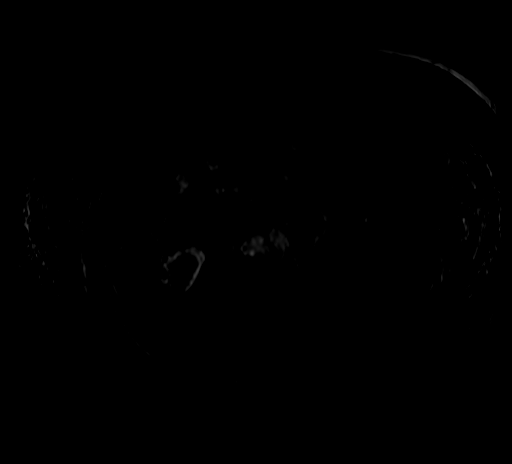

[26 of 48 positions shown; findings below may reference images not displayed]

FINDINGS: Lower chest: No acute findings.

Hepatobiliary: Liver is enlarged measuring 19.2 cm in length and
there is evidence of hepatic steatosis. Stable 2.5 cm cyst in the
left hepatic lobe segment 2, and a 2.1 cm cyst with thin internal
septation at the anterior aspect of the left hepatic lobe segment 3
which is stable in size since previous ultrasound and decreased in
size since previous MRI when it measured up to 6.2 cm. A few
additional tiny scattered hepatic cysts noted. No suspicious hepatic
mass visualized. Gallbladder appears normal. No biliary ductal
dilatation.

Pancreas: No mass, inflammatory changes, or other parenchymal
abnormality identified.

Spleen:  Within normal limits in size and appearance.

Adrenals/Urinary Tract: Adrenal glands are normal. A few tiny renal
cortical cysts bilaterally. No enhancing renal mass or
hydronephrosis visualized.

Stomach/Bowel: No evidence of bowel obstruction.

Vascular/Lymphatic: No pathologically enlarged lymph nodes
identified. No abdominal aortic aneurysm demonstrated.

Other:  No ascites.

Musculoskeletal: No suspicious bone lesions identified.
IMPRESSION: 1. Hepatomegaly and hepatic steatosis.
2. A few hepatic cysts including a mildly complex cyst at the
anterior left hepatic lobe.

## 2023-01-20 ENCOUNTER — Ambulatory Visit (INDEPENDENT_AMBULATORY_CARE_PROVIDER_SITE_OTHER): Payer: Medicaid Other | Admitting: Licensed Clinical Social Worker

## 2023-01-20 DIAGNOSIS — F1090 Alcohol use, unspecified, uncomplicated: Secondary | ICD-10-CM | POA: Diagnosis not present

## 2023-01-20 DIAGNOSIS — F1020 Alcohol dependence, uncomplicated: Secondary | ICD-10-CM

## 2023-01-20 DIAGNOSIS — F3132 Bipolar disorder, current episode depressed, moderate: Secondary | ICD-10-CM

## 2023-01-20 DIAGNOSIS — F4310 Post-traumatic stress disorder, unspecified: Secondary | ICD-10-CM | POA: Diagnosis not present

## 2023-01-20 NOTE — Progress Notes (Signed)
THERAPIST PROGRESS NOTE  Session Time: 3:06 p.m. to 3:45 p.m.   Type of Therapy: Individual   Therapist Response/Interventions: Solution-Focused/The therapist explains to Kristina Huffman that the best course of action as this point is to go to inpatient detox and answers her questions regarding aftercare treatment options and MAT.   Treatment Goals addressed:      Problem: Substance Use and Bipolar Disorder and PTSD     Dates: Start:  01/09/23       Disciplines: Interdisciplinary, PROVIDER        Goal:  [name] will abstain from alcohol per self-report and random UDS or breathalyzer while building sober supports.      Dates: Start:  01/09/23    Expected End:  07/10/23        Goal:  [name]'s PHQ-9 and GAD-7 will both be a 4 or less.      Dates: Start:  01/09/23    Expected End:  07/10/23          Summary: Kristina Huffman says that she relapsed last Thursday. She admits to drinking yesterday and to drinking this morning. The therapist is unable to obtain a BAC as he has not access to a breathalyzer at this office. She denies any current suicidal ideation.  She is tearful in saying that she needs help saying that she would like to get "on the shot" meaning Vivitrol admitting that if she were on Naltrexone that she would not take it if she wanted to drink.  The therapist informs her that she needs to first go to detox. She says that she can only be away from her home for a few days saying that she lives alone but hs no pets. She is adamant that she does not want residential treatment after completing detox.   The therapist informs her that no one can force her to go to inpatient residential treatment after detox; however, based on her history, this would be the recommendation. She wants to be put on MAT and be discharged to [facility].   [name] took the bus to today's appointment and apologizes for arriving "in this state." She asks to place some calls to try and find her phone which she lost yesterday at  the [facility].   She leaves the session saying that she will go to the [facility] for detox.   Progress Towards Goals: Not Progressing  Suicidal/Homicidal: No SI or HI  Plan: Will go to the [facility] for inpatient detox.   Diagnosis: Alcohol Use Disorder, Severe; PTSD; and Bipolar Disorder per history  Collaboration of Care: Other N/A  Patient/Guardian was advised Release of Information must be obtained prior to any record release in order to collaborate their care with an outside provider. Patient/Guardian was advised if they have not already done so to contact the registration department to sign all necessary forms in order for [place] to release information regarding their care.   Consent: Patient/Guardian gives verbal consent for treatment and assignment of benefits for services provided during this visit. Patient/Guardian expressed understanding and agreed to proceed.   Myrna Blazer, MA, LCSW, [facility], LCAS 01/20/2023

## 2023-01-21 ENCOUNTER — Ambulatory Visit (INDEPENDENT_AMBULATORY_CARE_PROVIDER_SITE_OTHER): Payer: Medicaid Other | Admitting: Dermatology

## 2023-01-21 ENCOUNTER — Encounter: Payer: Self-pay | Admitting: Dermatology

## 2023-01-21 DIAGNOSIS — D489 Neoplasm of uncertain behavior, unspecified: Secondary | ICD-10-CM

## 2023-01-21 DIAGNOSIS — D225 Melanocytic nevi of trunk: Secondary | ICD-10-CM | POA: Diagnosis not present

## 2023-01-21 DIAGNOSIS — L578 Other skin changes due to chronic exposure to nonionizing radiation: Secondary | ICD-10-CM

## 2023-01-21 DIAGNOSIS — Z808 Family history of malignant neoplasm of other organs or systems: Secondary | ICD-10-CM | POA: Diagnosis not present

## 2023-01-21 DIAGNOSIS — L814 Other melanin hyperpigmentation: Secondary | ICD-10-CM

## 2023-01-21 DIAGNOSIS — Z1283 Encounter for screening for malignant neoplasm of skin: Secondary | ICD-10-CM

## 2023-01-21 DIAGNOSIS — L918 Other hypertrophic disorders of the skin: Secondary | ICD-10-CM | POA: Diagnosis not present

## 2023-01-21 DIAGNOSIS — L820 Inflamed seborrheic keratosis: Secondary | ICD-10-CM

## 2023-01-21 DIAGNOSIS — D229 Melanocytic nevi, unspecified: Secondary | ICD-10-CM

## 2023-01-21 NOTE — Patient Instructions (Signed)
Cryotherapy Aftercare  Wash gently with soap and water everyday.   Apply Vaseline and Band-Aid daily until healed.   Patient Handout: Wound Care for Skin Biopsy Site  Patient Handout: Wound Care for Skin Biopsy Site  Taking Care of Your Skin Biopsy Site  Proper care of the biopsy site is essential for promoting healing and minimizing scarring. This handout provides instructions on how to care for your biopsy site to ensure optimal recovery.  1. Cleaning the Wound:  Clean the biopsy site daily with gentle soap and water. Gently pat the area dry with a clean, soft towel. Avoid harsh scrubbing or rubbing the area, as this can irritate the skin and delay healing.  2. Applying Aquaphor and Bandage:  After cleaning the wound, apply a thin layer of Aquaphor ointment to the biopsy site. Cover the area with a sterile bandage to protect it from dirt, bacteria, and friction. Change the bandage daily or as needed if it becomes soiled or wet.  3. Continued Care for One Week:  Repeat the cleaning, Aquaphor application, and bandaging process daily for one week following the biopsy procedure. Keeping the wound clean and moist during this initial healing period will help prevent infection and promote optimal healing.  4. Massaging Aquaphor into the Area:  ---After one week, discontinue the use of bandages but continue to apply Aquaphor to the biopsy site. ----Gently massage the Aquaphor into the area using circular motions. ---Massaging the skin helps to promote circulation and prevent the formation of scar tissue.   Additional Tips:  Avoid exposing the biopsy site to direct sunlight during the healing process, as this can cause hyperpigmentation or worsen scarring. If you experience any signs of infection, such as increased redness, swelling, warmth, or drainage from the wound, contact your healthcare provider immediately. Follow any additional instructions provided by your healthcare  provider for caring for the biopsy site and managing any discomfort. Conclusion:  Taking proper care of your skin biopsy site is crucial for ensuring optimal healing and minimizing scarring. By following these instructions for cleaning, applying Aquaphor, and massaging the area, you can promote a smooth and successful recovery. If you have any questions or concerns about caring for your biopsy site, don't hesitate to contact your healthcare provider for guidance.

## 2023-01-21 NOTE — Progress Notes (Signed)
   New Patient Visit   Subjective  Kristina Huffman is a 62 y.o. female who presents for the following: Spot Patient has had a nevus on the right upper back for about a year now. It is itchy. Maternal grandfather has hx of skin cancer. No personal hx of skin cancer or abnormal moles.   Patient also complains of a skin tag in the groin area.   The patient has spots, moles and lesions to be evaluated, some may be new or changing and the patient has concerns that these could be cancer.     The following portions of the chart were reviewed this encounter and updated as appropriate: medications, allergies, medical history  Review of Systems:  No other skin or systemic complaints except as noted in HPI or Assessment and Plan.  Objective  Well appearing patient in no apparent distress; mood and affect are within normal limits.  A focused examination was performed of the following areas: Back and groin  Relevant physical exam findings are noted in the Assessment and Plan.  Mid Back Irregular brown macule   Pubic Fleshy pink papule   Right Upper Back Stuck-on, irritated, waxy, tan-brown papules and/or plaques          Assessment & Plan   Neoplasm of uncertain behavior Mid Back  Skin / nail biopsy Type of biopsy: tangential   Informed consent: discussed and consent obtained   Timeout: patient name, date of birth, surgical site, and procedure verified   Procedure prep:  Patient was prepped and draped in usual sterile fashion Prep type:  Isopropyl alcohol Anesthesia: the lesion was anesthetized in a standard fashion   Anesthetic:  1% lidocaine w/ epinephrine 1-100,000 buffered w/ 8.4% NaHCO3 Instrument used: DermaBlade   Hemostasis achieved with: aluminum chloride   Outcome: patient tolerated procedure well   Post-procedure details: sterile dressing applied and wound care instructions given   Dressing type: petrolatum gauze and bandage    Specimen 1 - Surgical  pathology Differential Diagnosis: Rule out dysplastic nevus  Check Margins: No  Inflamed skin tag Pubic  Destruction of lesion - Pubic Complexity: simple   Destruction method: cryotherapy   Informed consent: discussed and consent obtained   Timeout:  patient name, date of birth, surgical site, and procedure verified Lesion destroyed using liquid nitrogen: Yes   Region frozen until ice ball extended beyond lesion: Yes   Outcome: patient tolerated procedure well with no complications   Post-procedure details: wound care instructions given    Inflamed seborrheic keratosis Right Upper Back  Destruction of lesion - Right Upper Back Complexity: simple   Destruction method: cryotherapy   Informed consent: discussed and consent obtained   Timeout:  patient name, date of birth, surgical site, and procedure verified Lesion destroyed using liquid nitrogen: Yes   Region frozen until ice ball extended beyond lesion: Yes   Outcome: patient tolerated procedure well with no complications   Post-procedure details: wound care instructions given       Return in about 1 year (around 01/21/2024) for FBSE.    Documentation: I have reviewed the above documentation for accuracy and completeness, and I agree with the above.  Langston Reusing, MD   I, Germaine Pomfret, CMA, am acting as scribe for Langston Reusing, MD.

## 2023-01-25 ENCOUNTER — Encounter: Payer: Medicaid Other | Admitting: Nurse Practitioner

## 2023-01-25 DIAGNOSIS — M79675 Pain in left toe(s): Secondary | ICD-10-CM

## 2023-01-25 NOTE — Progress Notes (Signed)
No response for request to upload photos.

## 2023-01-25 NOTE — Progress Notes (Signed)
I have spent 5 minutes in review of e-visit questionnaire, review and updating patient chart, medical decision making and response to patient.  ° °Raeanna Soberanes W Cierra Rothgeb, NP ° °  °

## 2023-01-26 ENCOUNTER — Ambulatory Visit (HOSPITAL_COMMUNITY)
Admission: EM | Admit: 2023-01-26 | Discharge: 2023-01-26 | Disposition: A | Discharge status: Other Acute Inpatient Hospital | Payer: Medicaid Other

## 2023-01-28 ENCOUNTER — Institutional Professional Consult (permissible substitution): Payer: Medicaid Other | Admitting: Plastic Surgery

## 2023-02-09 ENCOUNTER — Encounter: Payer: Self-pay | Admitting: Dermatology

## 2023-03-05 ENCOUNTER — Institutional Professional Consult (permissible substitution): Payer: Medicaid Other | Admitting: Plastic Surgery

## 2023-03-12 ENCOUNTER — Encounter (HOSPITAL_COMMUNITY): Payer: Self-pay | Admitting: Emergency Medicine

## 2023-03-12 ENCOUNTER — Other Ambulatory Visit: Payer: Self-pay

## 2023-03-12 ENCOUNTER — Ambulatory Visit (HOSPITAL_COMMUNITY)
Admission: EM | Admit: 2023-03-12 | Discharge: 2023-03-12 | Disposition: A | Discharge status: Home / Self Care | Payer: Medicaid Other | Attending: Physician Assistant | Admitting: Physician Assistant

## 2023-03-12 DIAGNOSIS — S61012A Laceration without foreign body of left thumb without damage to nail, initial encounter: Secondary | ICD-10-CM

## 2023-03-12 NOTE — ED Triage Notes (Signed)
Pt presenting with a cut on her left thumb that happened an hour ago.

## 2023-03-12 NOTE — ED Provider Notes (Signed)
[facility]    CSN: [id number] Arrival date & time: 03/12/23  1939      History   Chief Complaint Chief Complaint  Patient presents with   Finger Injury    Pt presenting with a cut on her left thumb that happened an hour ago.    HPI Kristina Huffman is a 62 y.o. female.   Patient presents today with a several hour history of laceration to her left thumb.  Reports that she was preparing food when she cut her thumb on a can.  She is right-handed.  Denies any numbness or paresthesias.  She did clean it with alcohol after this initially occurred and then presented to our clinic.  She has no concern for retained foreign body.  Her last tetanus was given on 03/22/2020.    Past Medical History:  Diagnosis Date   Aneurysm of splenic artery (HCC)    Anxiety    Asthma    Bipolar disorder (HCC)    Depression    Elevated LFTs    ETOH abuse    GERD (gastroesophageal reflux disease)    Hepatitis C    History of MRSA infection    legs and spread to face   Hypertension    Insomnia    Mixed hyperlipidemia    OSA (obstructive sleep apnea)    Sleep apnea     Patient Active Problem List   Diagnosis Date Noted   Alcohol addiction (HCC) 04/07/2022   Obstructive sleep apnea syndrome 05/08/2020   Acute respiratory failure due to COVID-19 (HCC) 10/14/2019   ARF (acute renal failure) (HCC) 10/14/2019   Essential hypertension 10/14/2019   Controlled type 2 diabetes mellitus with hyperglycemia (HCC) 10/14/2019   Alcohol use disorder, severe, dependence (HCC) 06/06/2018   Deviated nasal septum 12/03/2017   Mixed hyperlipidemia 11/29/2017   Gastroesophageal reflux disease 10/06/2017   Chronic hepatitis C without hepatic coma (HCC) 06/04/2016   Substance induced mood disorder (HCC) 09/20/2013   Benzodiazepine dependence (HCC) 09/21/2011   Bipolar 1 disorder, mixed, moderate (HCC) 09/21/2011   PTSD (post-traumatic stress disorder) 09/21/2011    Past Surgical History:   Procedure Laterality Date   BACK SURGERY     RADIAL HEAD ARTHROPLASTY  08/09/2012   Procedure: RADIAL HEAD ARTHROPLASTY;  Surgeon: Marlowe Shores, MD;  Location: [facility] OR;  Service: Orthopedics;  Laterality: Left;  Left Radial head Replacement    OB History     Gravida  8   Para  4   Term  4   Preterm      AB  4   Living  4      SAB  1   IAB  3   Ectopic      Multiple      Live Births  4            Home Medications    Prior to Admission medications   Medication Sig Start Date End Date Taking? Authorizing Provider  ACCU-CHEK GUIDE test strip test ONCE EVERY DAY 11/27/22   [provider]  Accu-Chek Softclix Lancets lancets daily. 11/14/22   [provider]  albuterol (VENTOLIN HFA) 108 (90 Base) MCG/ACT inhaler Inhale 2 puffs into the lungs every 6 (six) hours as needed for wheezing or shortness of breath. 11/13/22   Viviano Simas, FNP  allopurinol (ZYLOPRIM) 300 MG tablet Take 1 tablet every day by oral route for 90 days. Patient not taking: Reported on 12/25/2022 06/10/22   [provider]  atorvastatin (LIPITOR) 40 MG tablet Take 1 tablet (40 mg total) by mouth at bedtime. 04/09/22 05/09/22  Park Pope, MD  Blood Glucose Monitoring Suppl (ACCU-CHEK GUIDE ME) w/Device KIT daily. as directed 10/23/22   [provider]  Cholecalciferol (VITAMIN D3) 50 MCG (2000 UT) TABS Take 2,000 mcg by mouth daily. 04/09/22   Park Pope, MD  colchicine 0.6 MG tablet Take 0.6 mg by mouth daily as needed (For gout).    [provider]  DENTA 5000 PLUS 1.1 % CREA dental cream Take by mouth 2 (two) times daily. 08/19/22   [provider]  Docusate Sodium (DSS) 100 MG CAPS Take 1 capsule every day by oral route at bedtime for 30 days. 05/01/21   [provider]  escitalopram (LEXAPRO) 10 MG tablet Take 1 tablet orally at bedtime 11/03/22   [provider]  Escitalopram Oxalate (LEXAPRO PO) Take by mouth.    [provider]  FLUoxetine (PROZAC) 40 MG capsule Take 1 capsule (40 mg total) by mouth daily. 04/09/22 05/09/22  Park Pope, MD  gabapentin (NEURONTIN) 300 MG capsule Take 2 capsules (600 mg total) by mouth at bedtime. 04/10/22 12/25/22  Park Pope, MD  hydrochlorothiazide (HYDRODIURIL) 25 MG tablet Take 1 tablet (25 mg total) by mouth daily. 04/09/22 12/25/22  Park Pope, MD  ibuprofen (ADVIL) 200 MG tablet Take 400 mg by mouth every 6 (six) hours as needed for headache or mild pain. Patient not taking: Reported on 12/25/2022    [provider]  neomycin-bacitracin-polymyxin (NEOSPORIN) [id number] ointment Apply 1 Application topically 4 (four) times daily as needed (For infection on toe). Patient not taking: Reported on 12/25/2022    [provider]  ondansetron (ZOFRAN) 4 MG tablet Take 4 mg by mouth every 6 (six) hours as needed for nausea or vomiting.    [provider]  pantoprazole (PROTONIX) 40 MG tablet Take 1 tablet (40 mg total) by mouth daily. 11/17/22   Meryl Dare, MD  QUEtiapine (SEROQUEL) 100 MG tablet Take 1 tablet (100 mg total) by mouth daily. Take 1 tablet at 5 PM by mouth daily 04/09/22 12/25/22  Park Pope, MD  QUEtiapine (SEROQUEL) 200 MG tablet Take 1 tablet (200 mg total) by mouth at bedtime. Patient taking differently: Take 300 mg by mouth at bedtime. 04/09/22   Park Pope, MD  traZODone (DESYREL) 100 MG tablet Take 1 tablet (100 mg total) by mouth at bedtime. 04/09/22 12/25/22  Park Pope, MD  valACYclovir (VALTREX) 1000 MG tablet Take 1 tablet every 12 hours by oral route for 2 days. 06/10/22   [provider]  metFORMIN (GLUCOPHAGE) 500 MG tablet Take 1 tablet (500 mg total) by mouth 2 (two) times daily with a meal. For diabetes management 06/09/18 11/30/19  Sanjuana Kava, NP    Family History Family History  Problem Relation Age of Onset   Depression Mother    Osteoporosis Mother    COPD Mother    Rheum arthritis Mother    Diabetes Father     Hypertension Father    Congestive Heart Failure Father    Aneurysm Father    Clotting disorder Father    Heart disease Father    Cancer Maternal Grandfather    Colon cancer Neg Hx    Esophageal cancer Neg Hx    Rectal cancer Neg Hx    Stomach cancer Neg Hx     Social History Social History   Tobacco Use   Smoking status: Never  Smokeless tobacco: Never  Vaping Use   Vaping Use: Never used  Substance Use Topics   Alcohol use: Not Currently   Drug use: Not Currently    Types: Heroin    Comment: no drug use for 3 years     Allergies   Sulfa antibiotics and Aspirin   Review of Systems Review of Systems  Constitutional:  Negative for activity change, appetite change, fatigue and fever.  Musculoskeletal:  Negative for arthralgias and myalgias.  Skin:  Positive for wound.  Neurological:  Negative for weakness and numbness.     Physical Exam Triage Vital Signs ED Triage Vitals  Enc Vitals Group     BP 03/12/23 1948 131/84     Pulse Rate 03/12/23 1948 83     Resp 03/12/23 1948 18     Temp 03/12/23 1948 98 F (36.7 C)     Temp src --      SpO2 03/12/23 1948 98 %     Weight --      Height --      Head Circumference --      Peak Flow --      Pain Score 03/12/23 1949 0     Pain Loc --      Pain Edu? --      Excl. in GC? --    No data found.  Updated Vital Signs BP 131/84 (BP Location: Right Arm)   Pulse 83   Temp 98 F (36.7 C)   Resp 18   LMP 08/02/2013 Comment: irregular  SpO2 98%   Visual Acuity Right Eye Distance:   Left Eye Distance:   Bilateral Distance:    Right Eye Near:   Left Eye Near:    Bilateral Near:     Physical Exam Vitals reviewed.  Constitutional:      General: She is awake. She is not in acute distress.    Appearance: Normal appearance. She is well-developed. She is not ill-appearing.     Comments: Very pleasant female appears stated age in no acute distress sitting comfortably in exam room  HENT:     Head: Normocephalic  and atraumatic.  Cardiovascular:     Comments: Capillary refill within 2 seconds left thumb Pulmonary:     Effort: Pulmonary effort is normal. No tachypnea, accessory muscle usage or respiratory distress.  Musculoskeletal:     Left hand: Laceration present. No swelling, tenderness or bony tenderness. Normal range of motion. There is no disruption of two-point discrimination. Normal capillary refill.     Comments: Left thumb: 1 cm laceration noted proximal to interphalangeal joint.  Normal active range of motion of thumb.  Thumb is neurovascularly intact.  Psychiatric:        Behavior: Behavior is cooperative.      UC Treatments / Results  Labs (all labs ordered are listed, but only abnormal results are displayed) Labs Reviewed - No data to display  EKG   Radiology No results found.  Procedures Laceration Repair  Date/Time: 03/12/2023 8:25 PM  Performed by: Jeani Hawking, [name] Authorized by: Jeani Hawking, [name]   Consent:    Consent obtained:  Verbal   Consent given by:  Patient   Risks discussed:  Infection, pain, poor cosmetic result and retained foreign body   Alternatives discussed:  No treatment and observation Universal protocol:    Procedure explained and questions answered to patient or proxy's satisfaction: yes     Patient identity confirmed:  Verbally with patient Anesthesia:  Anesthesia method:  Local infiltration   Local anesthetic:  Lidocaine 1% w/o epi Laceration details:    Location:  Finger   Finger location:  L thumb   Length (cm):  1 Pre-procedure details:    Preparation:  Patient was prepped and draped in usual sterile fashion Exploration:    Hemostasis achieved with:  Direct pressure   Imaging outcome: foreign body not noted     Wound exploration: entire depth of wound visualized     Contaminated: no   Treatment:    Area cleansed with:  Chlorhexidine   Amount of cleaning:  Standard   Irrigation solution:  Sterile saline   Irrigation  volume:  20 mL   Irrigation method:  Syringe   Visualized foreign bodies/material removed: no   Skin repair:    Repair method:  Sutures   Suture size:  6-0   Suture material:  Prolene   Suture technique:  Simple interrupted   Number of sutures:  2 Approximation:    Approximation:  Close Repair type:    Repair type:  Simple Post-procedure details:    Dressing:  Open (no dressing)   Procedure completion:  Tolerated  (including critical care time)  Medications Ordered in UC Medications - No data to display  Initial Impression / Assessment and Plan / UC Course  I have reviewed the triage vital signs and the nursing notes.  Pertinent labs & imaging results that were available during my care of the patient were reviewed by me and considered in my medical decision making (see chart for details).     Tetanus is up-to-date.  Fingers neurovascularly intact.  Laceration closed with 2 simple interrupted sutures.  See procedure note above.  Discussed appropriate wound care.  She is to return in 10 days to have sutures removed.  We discussed that if she has any signs/symptoms of infection she needs to be reevaluated immediately.  Strict return precautions given.  All questions answered to patient satisfaction.  Final Clinical Impressions(s) / UC Diagnoses   Final diagnoses:  Laceration of left thumb without foreign body without damage to nail, initial encounter     Discharge Instructions      We placed 2 sutures today.  Please return in 10 days to have these removed.  Your tetanus is up-to-date and it was last given on 03/22/2020.  Keep this area clean with soap and water.  If you have any signs of infection including redness, swelling, pain, drainage you need to be seen immediately.    ED Prescriptions   None    PDMP not reviewed this encounter.   Jeani Hawking, [name] 03/12/23 2026

## 2023-03-12 NOTE — Discharge Instructions (Signed)
We placed 2 sutures today.  Please return in 10 days to have these removed.  Your tetanus is up-to-date and it was last given on 03/22/2020.  Keep this area clean with soap and water.  If you have any signs of infection including redness, swelling, pain, drainage you need to be seen immediately.

## 2023-03-24 ENCOUNTER — Ambulatory Visit (HOSPITAL_COMMUNITY): Payer: Medicaid Other

## 2023-03-25 ENCOUNTER — Other Ambulatory Visit: Payer: Self-pay

## 2023-03-25 ENCOUNTER — Ambulatory Visit (HOSPITAL_COMMUNITY)
Admission: RE | Admit: 2023-03-25 | Discharge: 2023-03-25 | Disposition: A | Discharge status: Home / Self Care | Payer: MEDICAID | Source: Ambulatory Visit | Attending: Internal Medicine | Admitting: Internal Medicine

## 2023-03-25 ENCOUNTER — Ambulatory Visit (HOSPITAL_COMMUNITY)
Admission: EM | Admit: 2023-03-25 | Discharge: 2023-03-26 | Disposition: A | Discharge status: Home / Self Care | Payer: MEDICAID | Attending: Nurse Practitioner | Admitting: Nurse Practitioner

## 2023-03-25 ENCOUNTER — Encounter (HOSPITAL_COMMUNITY): Payer: Self-pay

## 2023-03-25 ENCOUNTER — Ambulatory Visit (HOSPITAL_COMMUNITY): Payer: Self-pay

## 2023-03-25 DIAGNOSIS — F1994 Other psychoactive substance use, unspecified with psychoactive substance-induced mood disorder: Secondary | ICD-10-CM

## 2023-03-25 DIAGNOSIS — F1010 Alcohol abuse, uncomplicated: Secondary | ICD-10-CM

## 2023-03-25 DIAGNOSIS — Z4802 Encounter for removal of sutures: Secondary | ICD-10-CM | POA: Diagnosis not present

## 2023-03-25 DIAGNOSIS — F3175 Bipolar disorder, in partial remission, most recent episode depressed: Secondary | ICD-10-CM

## 2023-03-25 DIAGNOSIS — F419 Anxiety disorder, unspecified: Secondary | ICD-10-CM | POA: Insufficient documentation

## 2023-03-25 LAB — COMPREHENSIVE METABOLIC PANEL
ALT: 46 U/L — ABNORMAL HIGH (ref 0–44)
AST: 51 U/L — ABNORMAL HIGH (ref 15–41)
Albumin: 4.2 g/dL (ref 3.5–5.0)
Alkaline Phosphatase: 86 U/L (ref 38–126)
Anion gap: 13 (ref 5–15)
BUN: 11 mg/dL (ref 6–20)
CO2: 22 mmol/L (ref 22–32)
Calcium: 9.5 mg/dL (ref 8.9–10.3)
Chloride: 101 mmol/L (ref 98–111)
Creatinine, Ser: 0.73 mg/dL (ref 0.44–1.00)
GFR, Estimated: >60 mL/min (ref >60)
Glucose, Bld: 112 mg/dL — ABNORMAL HIGH (ref 70–99)
Potassium: 4 mmol/L (ref 3.5–5.1)
Sodium: 136 mmol/L (ref 135–145)
Total Bilirubin: 1.1 mg/dL (ref 0.3–1.2)
Total Protein: 7.2 g/dL (ref 6.5–8.1)

## 2023-03-25 LAB — LIPID PANEL
Cholesterol: 180 mg/dL (ref 0–200)
HDL: 51 mg/dL (ref >40)
LDL Cholesterol: UNDETERMINED mg/dL (ref 0–99)
Total CHOL/HDL Ratio: 3.5 RATIO
Triglycerides: 416 mg/dL — ABNORMAL HIGH (ref <150)
VLDL: UNDETERMINED mg/dL (ref 0–40)

## 2023-03-25 LAB — POCT URINE DRUG SCREEN - MANUAL ENTRY (I-SCREEN)
POC Amphetamine UR: NOT DETECTED
POC Buprenorphine (BUP): NOT DETECTED
POC Cocaine UR: NOT DETECTED
POC Marijuana UR: NOT DETECTED
POC Methadone UR: NOT DETECTED
POC Methamphetamine UR: NOT DETECTED
POC Morphine: NOT DETECTED
POC Oxazepam (BZO): NOT DETECTED
POC Oxycodone UR: NOT DETECTED
POC Secobarbital (BAR): NOT DETECTED

## 2023-03-25 LAB — HEMOGLOBIN A1C
Hgb A1c MFr Bld: 5.7 % — ABNORMAL HIGH (ref 4.8–5.6)
Mean Plasma Glucose: 116.89 mg/dL

## 2023-03-25 LAB — CBC WITH DIFFERENTIAL/PLATELET
Abs Immature Granulocytes: 0.02 10*3/uL (ref 0.00–0.07)
Basophils Absolute: 0 10*3/uL (ref 0.0–0.1)
Basophils Relative: 1 %
Eosinophils Absolute: 0.1 10*3/uL (ref 0.0–0.5)
Eosinophils Relative: 2 %
HCT: 46.1 % — ABNORMAL HIGH (ref 36.0–46.0)
Hemoglobin: 15.6 g/dL — ABNORMAL HIGH (ref 12.0–15.0)
Immature Granulocytes: 0 %
Lymphocytes Relative: 40 %
Lymphs Abs: 2.4 10*3/uL (ref 0.7–4.0)
MCH: 31.5 pg (ref 26.0–34.0)
MCHC: 33.8 g/dL (ref 30.0–36.0)
MCV: 93.1 fL (ref 80.0–100.0)
Monocytes Absolute: 0.3 10*3/uL (ref 0.1–1.0)
Monocytes Relative: 6 %
Neutro Abs: 3.1 10*3/uL (ref 1.7–7.7)
Neutrophils Relative %: 51 %
Platelets: 239 10*3/uL (ref 150–400)
RBC: 4.95 MIL/uL (ref 3.87–5.11)
RDW: 12.7 % (ref 11.5–15.5)
WBC: 6 10*3/uL (ref 4.0–10.5)
nRBC: 0 % (ref 0.0–0.2)

## 2023-03-25 LAB — POC URINE PREG, ED: Preg Test, Ur: NEGATIVE

## 2023-03-25 LAB — POCT PREGNANCY, URINE: Preg Test, Ur: NEGATIVE

## 2023-03-25 LAB — ETHANOL: Alcohol, Ethyl (B): 211 mg/dL — ABNORMAL HIGH (ref <10)

## 2023-03-25 MED ORDER — THIAMINE MONONITRATE 100 MG PO TABS
100.0000 mg | ORAL_TABLET | Freq: Every day | ORAL | Status: DC
  Administered 2023-03-26: 100 mg via ORAL
  Filled 2023-03-25: qty 1

## 2023-03-25 MED ORDER — HYDROXYZINE HCL 25 MG PO TABS: 25.0000 mg | ORAL_TABLET | Freq: Three times a day (TID) | ORAL | Status: DC | PRN

## 2023-03-25 MED ORDER — THIAMINE HCL 100 MG/ML IJ SOLN
100.0000 mg | Freq: Once | INTRAMUSCULAR | Status: AC
  Administered 2023-03-25: 100 mg via INTRAMUSCULAR
  Filled 2023-03-25: qty 2

## 2023-03-25 MED ORDER — HYDROXYZINE HCL 25 MG PO TABS
25.0000 mg | ORAL_TABLET | Freq: Four times a day (QID) | ORAL | Status: DC | PRN
  Administered 2023-03-25: 25 mg via ORAL
  Filled 2023-03-25: qty 1

## 2023-03-25 MED ORDER — LORAZEPAM 1 MG PO TABS: 1.0000 mg | ORAL_TABLET | Freq: Four times a day (QID) | ORAL | Status: DC | PRN

## 2023-03-25 MED ORDER — MAGNESIUM HYDROXIDE 400 MG/5ML PO SUSP: 30.0000 mL | Freq: Every day | ORAL | Status: DC | PRN

## 2023-03-25 MED ORDER — LOPERAMIDE HCL 2 MG PO CAPS: 2.0000 mg | ORAL_CAPSULE | ORAL | Status: DC | PRN

## 2023-03-25 MED ORDER — ALUM & MAG HYDROXIDE-SIMETH 200-200-20 MG/5ML PO SUSP: 30.0000 mL | ORAL | Status: DC | PRN

## 2023-03-25 MED ORDER — ONDANSETRON 4 MG PO TBDP: 4.0000 mg | ORAL_TABLET | Freq: Four times a day (QID) | ORAL | Status: DC | PRN

## 2023-03-25 MED ORDER — TRAZODONE HCL 50 MG PO TABS
50.0000 mg | ORAL_TABLET | Freq: Once | ORAL | Status: AC
  Administered 2023-03-25: 50 mg via ORAL
  Filled 2023-03-25: qty 1

## 2023-03-25 MED ORDER — ADULT MULTIVITAMIN W/MINERALS CH
1.0000 | ORAL_TABLET | Freq: Every day | ORAL | Status: DC
  Administered 2023-03-25 – 2023-03-26 (×2): 1 via ORAL
  Filled 2023-03-25 (×2): qty 1

## 2023-03-25 MED ORDER — ACETAMINOPHEN 325 MG PO TABS: 650.0000 mg | ORAL_TABLET | Freq: Four times a day (QID) | ORAL | Status: DC | PRN

## 2023-03-25 NOTE — ED Provider Notes (Incomplete)
[facility] Continuous Assessment Admission H&P  Date: 03/26/23 Patient Name: Kristina Huffman MRN: [id number] Chief Complaint: "I want outpatient substance abuse treatment"  Diagnoses:  Final diagnoses:  Substance induced mood disorder (HCC)  Alcohol abuse    [id number] [name] is a 62 year old female with psychiatric history of depression, bipolar disorder, alcohol use disorder, and anxiety who presented to [facility] voluntarily seeking outpatient alcohol detox treatment and complaints of passive SI, no plan.  Patient was seen face-to-face by this provider and chart reviewed. Per chart review patient's last visit to the [facility] was 04/06/22 for alcohol detox.  On evaluation, patient is alert, oriented x 3, and belligerent. Speech is clear, coherent but snappy with an extremely rude tone of voice. Pt appears disheveled. Eye contact is fleeting but avoidant for the most part. Mood is irritable with passive aggression, affect is congruent with mood. Thought process is coherent and thought content is WDL. Pt endorses passive SI, no plan, denies HI, and endorses occasional AVH. There is no objective indication that the patient is responding to internal stimuli. No delusions elicited during this assessment.    Patient presents as extremely rude and passive aggressive and answered most of the questions with a yes or no or simply ignored.  Patient also appeared very irritable and defensive/guarded.  Patient reports "I'm here because I saw Mr. Kristina Huffman and I want to get help because I've been drinking too much, I want to do outpatient treatment.  I've been drinking 3 L of wine today.  I want to stop.  I've been in touch with behavioral health/Mr. Kristina Huffman, and I've been drinking for 4 years".  Patient reluctantly reports she has been to rehab "couple of times" and inpatient at the [facility] in 2018.  Patient denies any stressors and responded "no". Patient reports that she lives "on my own".  Patient  responded "no" if she had access to a firearm.  When asked if she was having suicidal thoughts, patient responded "sometimes". When asked the last time she had a suicidal thought patient responded "this morning". When asked if she had a plan patient responded "just drink". When asked if she was experiencing auditory or visual hallucinations, she responded "sometimes".  When asked if she has a history of suicide attempts, patient reports "I do not remember".  Patient denies self-harm behaviors.  She reports her sleep is poor and appetite as "varies". When asked if she had a support system patient replied "no".  Patient reports a diagnosis of bipolar, stating she takes Seroquel, trazodone, and Prozac and describes her psychiatric medications as "not effective".  Patient reports "I don't feel safe returning home tonight, I want outpatient alcohol detox". Patient declined to respond if she has a history of alcohol withdrawal seizures.  Patient declined to answer further questions and completely ignored this provider.  Support, encouragement, reassurance provided about ongoing stressors.  Patient is provided with opportunity for questions.  Discussed recommendation for admission to the continuous observation unit overnight for safety monitoring and treatment and reeval in a.m. Patient may be appropriate for the [facility].  Total Time spent with patient: 20 minutes  Musculoskeletal  Strength & Muscle Tone: within normal limits Gait & Station: normal Patient leans: N/A  Psychiatric Specialty Exam  Presentation General Appearance:  Disheveled  Eye Contact: Poor; Other (comment) (Avoidant)  Speech: Clear and Coherent  Speech Volume: Increased  Handedness: Right   Mood and Affect  Mood: Irritable  Affect: Blunt   Thought Process  Thought Processes: Coherent  Descriptions of Associations:Intact  Orientation:Full (Time, Place and Person)  Thought Content:WDL  Diagnosis  of Schizophrenia or Schizoaffective disorder in past: No  Duration of Psychotic Symptoms: Greater than six months  Hallucinations:Hallucinations: None  Ideas of Reference:None  Suicidal Thoughts:Suicidal Thoughts: Yes, Passive SI Passive Intent and/or Plan: With Plan  Homicidal Thoughts:Homicidal Thoughts: No   Sensorium  Memory: Immediate Fair  Judgment: Poor  Insight: Poor   Executive Functions  Concentration: Fair  Attention Span: Fair  Recall: [employer] of Knowledge: Fair  Language: Fair   Psychomotor Activity  Psychomotor Activity: Psychomotor Activity: Normal   Assets  Assets: Manufacturing systems engineer; Desire for Improvement   Sleep  Sleep: Sleep: Poor   Nutritional Assessment (For OBS and FBC admissions only) Has the patient had a weight loss or gain of 10 pounds or more in the last 3 months?: No Has the patient had a decrease in food intake/or appetite?: No Does the patient have dental problems?: No Does the patient have eating habits or behaviors that may be indicators of an eating disorder including binging or inducing vomiting?: No Has the patient recently lost weight without trying?: 0 Has the patient been eating poorly because of a decreased appetite?: 0 Malnutrition Screening Tool Score: 0    Physical Exam Constitutional:      General: She is not in acute distress.    Appearance: She is not diaphoretic.  HENT:     Head: Normocephalic.     Right Ear: External ear normal.     Left Ear: External ear normal.     Nose: No congestion.  Neurological:     Mental Status: She is alert.    Review of Systems  Constitutional:  Negative for chills, diaphoresis and fever.  HENT:  Negative for congestion.   Eyes:  Negative for discharge.  Respiratory:  Negative for cough, shortness of breath and wheezing.   Cardiovascular:  Negative for chest pain and palpitations.  Gastrointestinal:  Negative for diarrhea, nausea and vomiting.   Neurological:  Negative for dizziness, seizures, loss of consciousness, weakness and headaches.  Psychiatric/Behavioral:  Positive for substance abuse.     Blood pressure (!) 149/88, pulse (!) 107, temperature 98.5 F (36.9 C), temperature source Oral, resp. rate 18, last menstrual period 08/02/2013, SpO2 96 %. There is no height or weight on file to calculate BMI.  Past Psychiatric History: See H & P   Is the patient at risk to self? Yes  Has the patient been a risk to self in the past 6 months?  Unknown .    Has the patient been a risk to self within the distant past? Yes   Is the patient a risk to others? No   Has the patient been a risk to others in the past 6 months? No   Has the patient been a risk to others within the distant past? No   Past Medical History: See Chart  Family History: N/A  Social History: N/A  Last Labs:  Admission on 03/25/2023  Component Date Value Ref Range Status   WBC 03/25/2023 6.0  4.0 - 10.5 K/uL Final   RBC 03/25/2023 4.95  3.87 - 5.11 MIL/uL Final   Hemoglobin 03/25/2023 15.6 (H)  12.0 - 15.0 g/dL Final   HCT 45/40/9811 46.1 (H)  36.0 - 46.0 % Final   MCV 03/25/2023 93.1  80.0 - 100.0 fL Final   MCH 03/25/2023 31.5  26.0 - 34.0 pg Final   MCHC 03/25/2023 33.8  30.0 -  36.0 g/dL Final   RDW 16/06/9603 12.7  11.5 - 15.5 % Final   Platelets 03/25/2023 239  150 - 400 K/uL Final   nRBC 03/25/2023 0.0  0.0 - 0.2 % Final   Neutrophils Relative % 03/25/2023 51  % Final   Neutro Abs 03/25/2023 3.1  1.7 - 7.7 K/uL Final   Lymphocytes Relative 03/25/2023 40  % Final   Lymphs Abs 03/25/2023 2.4  0.7 - 4.0 K/uL Final   Monocytes Relative 03/25/2023 6  % Final   Monocytes Absolute 03/25/2023 0.3  0.1 - 1.0 K/uL Final   Eosinophils Relative 03/25/2023 2  % Final   Eosinophils Absolute 03/25/2023 0.1  0.0 - 0.5 K/uL Final   Basophils Relative 03/25/2023 1  % Final   Basophils Absolute 03/25/2023 0.0  0.0 - 0.1 K/uL Final   Immature Granulocytes  03/25/2023 0  % Final   Abs Immature Granulocytes 03/25/2023 0.02  0.00 - 0.07 K/uL Final   Performed at [facility], [address], [place], [place] [address]   Sodium 03/25/2023 136  135 - 145 mmol/L Final   Potassium 03/25/2023 4.0  3.5 - 5.1 mmol/L Final   Chloride 03/25/2023 101  98 - 111 mmol/L Final   CO2 03/25/2023 22  22 - 32 mmol/L Final   Glucose, Bld 03/25/2023 112 (H)  70 - 99 mg/dL Final   Glucose reference range applies only to samples taken after fasting for at least 8 hours.   BUN 03/25/2023 11  6 - 20 mg/dL Final   Creatinine, Ser 03/25/2023 0.73  0.44 - 1.00 mg/dL Final   Calcium 11/91/4782 9.5  8.9 - 10.3 mg/dL Final   Total Protein 95/62/1308 7.2  6.5 - 8.1 g/dL Final   Albumin 65/78/4696 4.2  3.5 - 5.0 g/dL Final   AST 29/52/8413 51 (H)  15 - 41 U/L Final   ALT 03/25/2023 46 (H)  0 - 44 U/L Final   Alkaline Phosphatase 03/25/2023 86  38 - 126 U/L Final   Total Bilirubin 03/25/2023 1.1  0.3 - 1.2 mg/dL Final   GFR, Estimated 03/25/2023 >60  >60 mL/min Final   Comment: (NOTE) Calculated using the CKD-EPI Creatinine Equation (2021)    Anion gap 03/25/2023 13  5 - 15 Final   Performed at [facility], [address], [place], [place] [address]   Hgb A1c MFr Bld 03/25/2023 5.7 (H)  4.8 - 5.6 % Final   Comment: (NOTE) Pre diabetes:          5.7%-6.4%  Diabetes:              >6.4%  Glycemic control for   <7.0% adults with diabetes    Mean Plasma Glucose 03/25/2023 116.89  mg/dL Final   Performed at [facility], [address], [place], [place] [address]   Alcohol, Ethyl (B) 03/25/2023 211 (H)  <10 mg/dL Final   Comment: (NOTE) Lowest detectable limit for serum alcohol is 10 mg/dL.  For medical purposes only. Performed at [facility], [address] [place], [place] [address]    Cholesterol 03/25/2023 180  0 - 200 mg/dL Final   Triglycerides 03/47/4259 416 (H)  <150 mg/dL Final   HDL 56/38/7564 51  >40 mg/dL Final   Total  CHOL/HDL Ratio 03/25/2023 3.5  RATIO Final   VLDL 03/25/2023 UNABLE TO CALCULATE IF TRIGLYCERIDE OVER 400 mg/dL  0 - 40 mg/dL Final   LDL Cholesterol 03/25/2023 UNABLE TO  CALCULATE IF TRIGLYCERIDE OVER 400 mg/dL  0 - 99 mg/dL Final   Comment:        Total Cholesterol/HDL:CHD Risk Coronary Heart Disease Risk Table                     Men   Women  1/2 Average Risk   3.4   3.3  Average Risk       5.0   4.4  2 X Average Risk   9.6   7.1  3 X Average Risk  23.4   11.0        Use the calculated Patient Ratio above and the CHD Risk Table to determine the patient's CHD Risk.        ATP III CLASSIFICATION (LDL):  <100     mg/dL   Optimal  161-096  mg/dL   Near or Above                    Optimal  130-159  mg/dL   Borderline  045-409  mg/dL   High  >811     mg/dL   Very High Performed at [facility], [address] [place], [place] [address]    TSH 03/25/2023 3.277  0.350 - 4.500 uIU/mL Final   Comment: Performed by a 3rd Generation assay with a functional sensitivity of <=0.01 uIU/mL. Performed at [facility], [address] [place], [place] [address]    Preg Test, Ur 03/25/2023 Negative  Negative Final   POC Amphetamine UR 03/25/2023 None Detected  NONE DETECTED (Cut Off Level 1000 ng/mL) Final   POC Secobarbital (BAR) 03/25/2023 None Detected  NONE DETECTED (Cut Off Level 300 ng/mL) Final   POC Buprenorphine (BUP) 03/25/2023 None Detected  NONE DETECTED (Cut Off Level 10 ng/mL) Final   POC Oxazepam (BZO) 03/25/2023 None Detected  NONE DETECTED (Cut Off Level 300 ng/mL) Final   POC Cocaine UR 03/25/2023 None Detected  NONE DETECTED (Cut Off Level 300 ng/mL) Final   POC Methamphetamine UR 03/25/2023 None Detected  NONE DETECTED (Cut Off Level 1000 ng/mL) Final   POC Morphine 03/25/2023 None Detected  NONE DETECTED (Cut Off Level 300 ng/mL) Final   POC Methadone UR 03/25/2023 None Detected  NONE DETECTED (Cut Off Level 300 ng/mL) Final   POC Oxycodone UR 03/25/2023 None  Detected  NONE DETECTED (Cut Off Level 100 ng/mL) Final   POC Marijuana UR 03/25/2023 None Detected  NONE DETECTED (Cut Off Level 50 ng/mL) Final   Preg Test, Ur 03/25/2023 NEGATIVE  NEGATIVE Final   Comment:        THE SENSITIVITY OF THIS METHODOLOGY IS >24 mIU/mL    Direct LDL 03/25/2023 74  0 - 99 mg/dL Final   Performed at [facility], [address], [place], [place] [address]    Allergies: Sulfa antibiotics and Aspirin  Medications:  Facility Ordered Medications  Medication   acetaminophen (TYLENOL) tablet 650 mg   alum & mag hydroxide-simeth (MAALOX/MYLANTA) 200-200-20 MG/5ML suspension 30 mL   magnesium hydroxide (MILK OF MAGNESIA) suspension 30 mL   [COMPLETED] thiamine (VITAMIN B1) injection 100 mg   thiamine (VITAMIN B1) tablet 100 mg   multivitamin with minerals tablet 1 tablet   LORazepam (ATIVAN) tablet 1 mg   hydrOXYzine (ATARAX) tablet 25 mg   loperamide (IMODIUM) capsule 2-4 mg   ondansetron (ZOFRAN-ODT) disintegrating tablet 4 mg   [COMPLETED] traZODone (DESYREL) tablet 50 mg   LORazepam (ATIVAN) tablet  1 mg   Followed by   Melene Muller ON 03/27/2023] LORazepam (ATIVAN) tablet 1 mg   Followed by   Melene Muller ON 03/28/2023] LORazepam (ATIVAN) tablet 1 mg   Followed by   Melene Muller ON 03/29/2023] LORazepam (ATIVAN) tablet 1 mg   PTA Medications  Medication Sig   ibuprofen (ADVIL) 200 MG tablet Take 400 mg by mouth every 6 (six) hours as needed for headache or mild pain. (Patient not taking: Reported on 12/25/2022)   ondansetron (ZOFRAN) 4 MG tablet Take 4 mg by mouth every 6 (six) hours as needed for nausea or vomiting.   neomycin-bacitracin-polymyxin (NEOSPORIN) [phone] ointment Apply 1 Application topically 4 (four) times daily as needed (For infection on toe). (Patient not taking: Reported on 12/25/2022)   colchicine 0.6 MG tablet Take 0.6 mg by mouth daily as needed (For gout).   QUEtiapine (SEROQUEL) 200 MG tablet Take 1 tablet (200 mg total) by mouth at bedtime.  (Patient taking differently: Take 300 mg by mouth at bedtime.)   Cholecalciferol (VITAMIN D3) 50 MCG (2000 UT) TABS Take 2,000 mcg by mouth daily.   albuterol (VENTOLIN HFA) 108 (90 Base) MCG/ACT inhaler Inhale 2 puffs into the lungs every 6 (six) hours as needed for wheezing or shortness of breath.   pantoprazole (PROTONIX) 40 MG tablet Take 1 tablet (40 mg total) by mouth daily.   Escitalopram Oxalate (LEXAPRO PO) Take by mouth.   allopurinol (ZYLOPRIM) 300 MG tablet Take 1 tablet every day by oral route for 90 days. (Patient not taking: Reported on 12/25/2022)   Blood Glucose Monitoring Suppl (ACCU-CHEK GUIDE ME) w/Device KIT daily. as directed   ACCU-CHEK GUIDE test strip test ONCE EVERY DAY   Docusate Sodium (DSS) 100 MG CAPS Take 1 capsule every day by oral route at bedtime for 30 days.   Accu-Chek Softclix Lancets lancets daily.   DENTA 5000 PLUS 1.1 % CREA dental cream Take by mouth 2 (two) times daily.   valACYclovir (VALTREX) 1000 MG tablet Take 1 tablet every 12 hours by oral route for 2 days.   escitalopram (LEXAPRO) 10 MG tablet Take 1 tablet orally at bedtime      Medical Decision Making  Recommend admission to the continuous observation unit overnight for safety monitoring and re-eval in the a.m. Patient is requesting outpatient substance abuse treatment but reports feeling unsafe returning home tonight.  Patient is unsafe to discharge at this time.  Lab Orders         CBC with Differential/Platelet         Comprehensive metabolic panel         Hemoglobin A1c         Ethanol         Lipid panel         TSH         LDL cholesterol, direct         POC urine preg, ED         POCT Urine Drug Screen - (I-Screen)         Pregnancy, urine POC      Recommend CIWA protocol Initiate CIWA protocol -lorazepam 1 mg every 6 hours prn for CIWA >10 -thiamine 100 mg daily for nutritional supplementation -hydroxyzine 25 mg every 6 hours prn for anxiety, CIWA < or = 10 -ondansetron 4  mg ODT every 6 hours prn nausea/vomiting -loperamide 2-4 mg capsule prn diarrhea or loose stools -Vitamin B1 100 mg IM for 1 dose -Vitamin B1 100 mg p.o. daily  Other PRNs -Tylenol 650 mg p.o. every 6 hours as needed pain -Maalox 30 ml p.o. every 4 hours as needed indigestion -MOM 30 mL p.o. daily as needed constipation   Recommendations  Based on my evaluation the patient does not appear to have an emergency medical condition.  Recommend admission to the continuous observation unit overnight for safety monitoring and reeval in the a.m. Recommend CIWA protocol.  Mancel Bale, NP 03/26/23  1:13 AM

## 2023-03-25 NOTE — ED Triage Notes (Signed)
Patient here today to have suture removed from her left thumb. The area looks well. No pain.

## 2023-03-25 NOTE — Progress Notes (Signed)
   03/25/23 2013  [facility] Triage Screening (Walk-ins at [facility] only)  How Did You Hear About [place]? Self  What Is the Reason for Your Visit/Call Today? Kristina Huffman is a 62 year old single female who presents voluntarily to [facility] with complaints of depression, anxiety and passive SI with no plan. Patient reports she has been depressed since the passing of her 70 year old son in 2012. Patient states she wishes she did not wake up at times. Patient is prescribed Seroquel and Trazadone by Dr. Maggie Schwalbe. Patient reports having VH when she is going to sleep at night. Patient states she has been drinking daily for the past year and drank 3 liters of wine today. Patient had a suicide attempt in 2012 by overdose and was hospitalized last year for SI. Patient denies HI or AH. Patient is unable to contract for safety. Patient is urgent.  How Long Has This Been Causing You Problems? > than 6 months  Have You Recently Had Any Thoughts About Hurting Yourself? No  Are You Planning to Commit Suicide/Harm Yourself At This time? No  Have you Recently Had Thoughts About Hurting Someone [name]? No  Are You Planning To Harm Someone At This Time? No  Are you currently experiencing any auditory, visual or other hallucinations? No  Have You Used Any Alcohol or Drugs in the Past 24 Hours? Yes  How long ago did you use Drugs or Alcohol? Today  What Did You Use and How Much? 3 liters of wine.  Do you have any current medical co-morbidities that require immediate attention? No  Clinician description of patient physical appearance/behavior: Patient is dressed casually, alert and oriented x4. Patient has a flat affect and depressed mood. Patient cries throughout the assessment.  What Do You Feel Would Help You the Most Today? Treatment for Depression or other mood problem;Alcohol or Drug Use Treatment  If access to [facility] was not available, would you have sought care in the Emergency Department? No  Determination of  Need Urgent (48 hours)  Options For Referral Chemical Dependency Intensive Outpatient Therapy (CDIOP);Inpatient Hospitalization

## 2023-03-25 NOTE — ED Notes (Signed)
Pt A&O x 4, presents with passive SI, depression and anxiety. Since the passing of her 60 year old son in 2012. Pt states she wishes she did not wake up at times.  Calm & cooperative at present, no distress noted.  Denies HI or AVH.  Monitoring for safety.

## 2023-03-25 NOTE — ED Notes (Signed)
Pt pleasant, cooperative,, sad, flat affect. Does admit to passive SI but contracts for safety. Denies HI/AVH. Denies s/s of withdrawal. No noted distress. Will continue to monitor for safety
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
## 2023-03-26 LAB — TSH: TSH: 3.277 u[IU]/mL (ref 0.350–4.500)

## 2023-03-26 LAB — LDL CHOLESTEROL, DIRECT: Direct LDL: 74 mg/dL (ref 0–99)

## 2023-03-26 MED ORDER — LORAZEPAM 1 MG PO TABS: 1.0000 mg | ORAL_TABLET | Freq: Every day | ORAL | Status: DC

## 2023-03-26 MED ORDER — LORAZEPAM 1 MG PO TABS
1.0000 mg | ORAL_TABLET | Freq: Four times a day (QID) | ORAL | Status: DC
  Administered 2023-03-26: 1 mg via ORAL
  Filled 2023-03-26: qty 1

## 2023-03-26 MED ORDER — LORAZEPAM 1 MG PO TABS: 1.0000 mg | ORAL_TABLET | Freq: Two times a day (BID) | ORAL | Status: DC

## 2023-03-26 MED ORDER — LORAZEPAM 1 MG PO TABS: 1.0000 mg | ORAL_TABLET | Freq: Three times a day (TID) | ORAL | Status: DC

## 2023-03-26 NOTE — ED Notes (Signed)
Pt observed/assessed in recliner sleeping. RR even and unlabored, appearing in no noted distress. Environmental check complete, will continue to monitor for safety 

## 2023-03-26 NOTE — Discharge Instructions (Signed)
Please call Myrna Blazer, LCSW at [phone] for intensive outpatient group sessions for substance use.  Follow-up recommendations:  Activity:  Normal, as tolerated Diet:  Per PCP recommendation  Patient is instructed prior to discharge to: Take all medications as prescribed by her mental healthcare provider. Report any adverse effects and/or reactions from the medicines to her outpatient provider promptly. Patient has been instructed & cautioned: To not engage in alcohol and or illegal drug use while on prescription medicines.  In the event of worsening symptoms, patient is instructed to call the crisis hotline at 988, 911 and or go to the nearest ED for appropriate evaluation and treatment of symptoms. To follow-up with her primary care provider for your other medical issues, concerns and or health care needs.

## 2023-03-26 NOTE — ED Provider Notes (Signed)
FBC/OBS ASAP Discharge Summary  Date and Time: 03/26/2023 10:47 AM  Name: Kristina Huffman  MRN:  [id number]   Discharge Diagnoses:  Final diagnoses:  Substance induced mood disorder (HCC)  Alcohol abuse  Bipolar disorder, in partial remission, most recent episode depressed (HCC)    Subjective: Kristina Huffman is a 62 year old female with psychiatric history of bipolar disorder, alcohol use disorder, and anxiety who presented to [facility] voluntarily seeking outpatient alcohol detox treatment and initially reported passive SI.  Patient was seen in exam room with Dr. Lucianne Muss. Patient is pleasant and cooperative this morning. She reports drinking a bottle and a half of wine daily since a break up that occurred weeks ago. She started drinking in 2017. She has a previous period of sobriety for 9 months and she attributes this to living in a [place] based home. She feels church is her support system. She denies having a sponsor and does not want to attend AA meetings as she feels "those don't work for me". At this time, she is hesitant in completing detox at [facility] stating "well I dont think my front door to my home is locked and nobody can help me". She is wanting to pursue outpatient services, specifically CDIOP with Myrna Blazer. She notes talking to him prior to coming and was interested in doing CDIOP but she was advised to complete detox first. She denies SI, HI, and AVH at this time.   Patient has risk factors for relapsing on alcohol including lack of social support (lives alone), separated from husband, history of DSS with her daughter living in [place] with her cousin, loss of son in 2012, and chronic alcohol use. At this time, she will pursue CDIOP and was encouraged to seek more intensive care that includes detox when she is ready. Patient was provided contact information for Myrna Blazer on discharge.  Stay Summary: The patient was evaluated each day by a clinical provider to ascertain response to  treatment. Improvement was noted by the patient's report of decreasing symptoms, improved sleep and appetite, affect, medication tolerance, behavior, and participation in unit programming.  Patient was asked each day to complete a self inventory noting mood, mental status, pain, new symptoms, anxiety and concerns.  The patient's medications were managed with the following directions: Ativan taper was initiated. Other meds were also given including multivitamin, oral thiamine, trazodone, and hydroxyzine  Patient responded well to medication and being in a therapeutic and supportive environment. Positive and appropriate behavior was noted and the patient was motivated for recovery. The patient worked closely with the treatment team and case manager to develop a discharge plan with appropriate goals. Coping skills, problem solving as well as relaxation therapies were also part of the unit programming.   Total Time spent with patient: 30 minutes  Past Psychiatric History:  Diagnoses: Bipolar disorder Current medications: Seroquel, trazodone, Prozac Psychiatrist: Dr. Maggie Schwalbe History SI: 2012 by overdose Previous hospitalizations: Most recently hospitalized at [facility] in 2019 for SI and alcohol use disorder; other previous admissions to [facility] in 06/2013 and 08/2013 Past Medical History: Aneurysm of splenic artery, asthma, diabetes, GERD, hep C, hyperlipidemia, OSA Family History:   Depression Mother     Osteoporosis Mother     COPD Mother     Rheum arthritis Mother     Diabetes Father     Hypertension Father     Congestive Heart Failure Father     Aneurysm Father     Clotting disorder Father     Heart disease  Father    Social History:  Living: Alone in a house in [place] Employment: SSI Support: Ex-husband whom she is separated within 2020, church Firearms: Denies Tobacco Cessation:  N/A, patient does not currently use tobacco products  Current Medications:  Current Facility-Administered  Medications  Medication Dose Route Frequency Provider Last Rate Last Admin   acetaminophen (TYLENOL) tablet 650 mg  650 mg Oral Q6H PRN Onuoha, Chinwendu V, NP       alum & mag hydroxide-simeth (MAALOX/MYLANTA) 200-200-20 MG/5ML suspension 30 mL  30 mL Oral Q4H PRN Onuoha, Chinwendu V, NP       hydrOXYzine (ATARAX) tablet 25 mg  25 mg Oral Q6H PRN Onuoha, Chinwendu V, NP   25 mg at 03/25/23 2135   loperamide (IMODIUM) capsule 2-4 mg  2-4 mg Oral PRN Onuoha, Chinwendu V, NP       LORazepam (ATIVAN) tablet 1 mg  1 mg Oral Q6H PRN Onuoha, Chinwendu V, NP       LORazepam (ATIVAN) tablet 1 mg  1 mg Oral QID Onuoha, Chinwendu V, NP   1 mg at 03/26/23 0912   Followed by   Melene Muller ON 03/27/2023] LORazepam (ATIVAN) tablet 1 mg  1 mg Oral TID Onuoha, Chinwendu V, NP       Followed by   Melene Muller ON 03/28/2023] LORazepam (ATIVAN) tablet 1 mg  1 mg Oral BID Onuoha, Chinwendu V, NP       Followed by   Melene Muller ON 03/29/2023] LORazepam (ATIVAN) tablet 1 mg  1 mg Oral Daily Onuoha, Chinwendu V, NP       magnesium hydroxide (MILK OF MAGNESIA) suspension 30 mL  30 mL Oral Daily PRN Onuoha, Chinwendu V, NP       multivitamin with minerals tablet 1 tablet  1 tablet Oral Daily Onuoha, Chinwendu V, NP   1 tablet at 03/26/23 0911   ondansetron (ZOFRAN-ODT) disintegrating tablet 4 mg  4 mg Oral Q6H PRN Onuoha, Chinwendu V, NP       thiamine (VITAMIN B1) tablet 100 mg  100 mg Oral Daily Onuoha, Chinwendu V, NP   100 mg at 03/26/23 0911   Current Outpatient Medications  Medication Sig Dispense Refill   ACCU-CHEK GUIDE test strip test ONCE EVERY DAY     Accu-Chek Softclix Lancets lancets daily.     albuterol (VENTOLIN HFA) 108 (90 Base) MCG/ACT inhaler Inhale 2 puffs into the lungs every 6 (six) hours as needed for wheezing or shortness of breath. 8 g 0   atorvastatin (LIPITOR) 40 MG tablet Take 1 tablet (40 mg total) by mouth at bedtime. 30 tablet 0   Blood Glucose Monitoring Suppl (ACCU-CHEK GUIDE ME) w/Device KIT daily. as  directed     Cholecalciferol (VITAMIN D3) 50 MCG (2000 UT) TABS Take 2,000 mcg by mouth daily. 30 tablet 0   colchicine 0.6 MG tablet Take 0.6 mg by mouth daily as needed (For gout).     DENTA 5000 PLUS 1.1 % CREA dental cream Take by mouth 2 (two) times daily.     Docusate Sodium (DSS) 100 MG CAPS Take 1 capsule every day by oral route at bedtime for 30 days.     escitalopram (LEXAPRO) 10 MG tablet Take 1 tablet orally at bedtime     gabapentin (NEURONTIN) 300 MG capsule Take 2 capsules (600 mg total) by mouth at bedtime. 60 capsule 0   hydrochlorothiazide (HYDRODIURIL) 25 MG tablet Take 1 tablet (25 mg total) by mouth daily. 30 tablet 0  ondansetron (ZOFRAN) 4 MG tablet Take 4 mg by mouth every 6 (six) hours as needed for nausea or vomiting.     pantoprazole (PROTONIX) 40 MG tablet Take 1 tablet (40 mg total) by mouth daily. 30 tablet 0   QUEtiapine (SEROQUEL) 100 MG tablet Take 1 tablet (100 mg total) by mouth daily. Take 1 tablet at 5 PM by mouth daily 30 tablet 0   QUEtiapine (SEROQUEL) 200 MG tablet Take 1 tablet (200 mg total) by mouth at bedtime. (Patient taking differently: Take 300 mg by mouth at bedtime.) 30 tablet 0   traZODone (DESYREL) 100 MG tablet Take 1 tablet (100 mg total) by mouth at bedtime. 30 tablet 0   valACYclovir (VALTREX) 1000 MG tablet Take 1 tablet every 12 hours by oral route for 2 days.      PTA Medications:  PTA Medications  Medication Sig   ondansetron (ZOFRAN) 4 MG tablet Take 4 mg by mouth every 6 (six) hours as needed for nausea or vomiting.   colchicine 0.6 MG tablet Take 0.6 mg by mouth daily as needed (For gout).   QUEtiapine (SEROQUEL) 200 MG tablet Take 1 tablet (200 mg total) by mouth at bedtime. (Patient taking differently: Take 300 mg by mouth at bedtime.)   Cholecalciferol (VITAMIN D3) 50 MCG (2000 UT) TABS Take 2,000 mcg by mouth daily.   albuterol (VENTOLIN HFA) 108 (90 Base) MCG/ACT inhaler Inhale 2 puffs into the lungs every 6 (six) hours as  needed for wheezing or shortness of breath.   pantoprazole (PROTONIX) 40 MG tablet Take 1 tablet (40 mg total) by mouth daily.   Blood Glucose Monitoring Suppl (ACCU-CHEK GUIDE ME) w/Device KIT daily. as directed   ACCU-CHEK GUIDE test strip test ONCE EVERY DAY   Docusate Sodium (DSS) 100 MG CAPS Take 1 capsule every day by oral route at bedtime for 30 days.   Accu-Chek Softclix Lancets lancets daily.   DENTA 5000 PLUS 1.1 % CREA dental cream Take by mouth 2 (two) times daily.   valACYclovir (VALTREX) 1000 MG tablet Take 1 tablet every 12 hours by oral route for 2 days.   escitalopram (LEXAPRO) 10 MG tablet Take 1 tablet orally at bedtime   Facility Ordered Medications  Medication   acetaminophen (TYLENOL) tablet 650 mg   alum & mag hydroxide-simeth (MAALOX/MYLANTA) 200-200-20 MG/5ML suspension 30 mL   magnesium hydroxide (MILK OF MAGNESIA) suspension 30 mL   [COMPLETED] thiamine (VITAMIN B1) injection 100 mg   thiamine (VITAMIN B1) tablet 100 mg   multivitamin with minerals tablet 1 tablet   LORazepam (ATIVAN) tablet 1 mg   hydrOXYzine (ATARAX) tablet 25 mg   loperamide (IMODIUM) capsule 2-4 mg   ondansetron (ZOFRAN-ODT) disintegrating tablet 4 mg   [COMPLETED] traZODone (DESYREL) tablet 50 mg   LORazepam (ATIVAN) tablet 1 mg   Followed by   Melene Muller ON 03/27/2023] LORazepam (ATIVAN) tablet 1 mg   Followed by   Melene Muller ON 03/28/2023] LORazepam (ATIVAN) tablet 1 mg   Followed by   Melene Muller ON 03/29/2023] LORazepam (ATIVAN) tablet 1 mg       01/08/2023    3:04 PM 12/25/2022    1:13 PM 04/09/2022    2:57 PM  Depression screen PHQ 2/9  Decreased Interest 2 2 1   Down, Depressed, Hopeless 2 3 0  PHQ - 2 Score 4 5 1   Altered sleeping 3 3   Tired, decreased energy 1 3   Change in appetite 2    Feeling bad or failure  about yourself  1 3   Trouble concentrating 0 3   Moving slowly or fidgety/restless 2 3   Suicidal thoughts 0 3   PHQ-9 Score 13 23     Flowsheet Row ED from 03/25/2023 in  [facility] Most recent reading at 03/25/2023  9:59 PM ED from 03/25/2023 in [facility] [place] Most recent reading at 03/25/2023  6:05 PM ED from 03/12/2023 in [facility] Most recent reading at 03/12/2023  7:50 PM  C-SSRS RISK CATEGORY Low Risk No Risk No Risk       Musculoskeletal  Strength & Muscle Tone: within normal limits Gait & Station: normal Patient leans: N/A  Psychiatric Specialty Exam  Presentation  General Appearance:  Disheveled  Eye Contact: Fair  Speech: Clear and Coherent  Speech Volume: Normal  Handedness: Right   Mood and Affect  Mood: Depressed  Affect: Congruent   Thought Process  Thought Processes: Coherent  Descriptions of Associations:Intact  Orientation:Full (Time, Place and Person)  Thought Content:Logical  Diagnosis of Schizophrenia or Schizoaffective disorder in past: No    Hallucinations:Hallucinations: None  Ideas of Reference:None  Suicidal Thoughts:Suicidal Thoughts: No   Homicidal Thoughts:Homicidal Thoughts: No   Sensorium  Memory: Immediate Good; Recent Good; Remote Good  Judgment: Poor  Insight: Fair   Chartered certified accountant: Good  Attention Span: Good  Recall: Good  Fund of Knowledge: Good  Language: Good   Psychomotor Activity  Psychomotor Activity: Psychomotor Activity: Normal   Assets  Assets: Housing; Communication Skills   Sleep  Sleep: Sleep: Fair   Nutritional Assessment (For OBS and FBC admissions only) Has the patient had a weight loss or gain of 10 pounds or more in the last 3 months?: No Has the patient had a decrease in food intake/or appetite?: No Does the patient have dental problems?: No Does the patient have eating habits or behaviors that may be indicators of an eating disorder including binging or inducing vomiting?: No Has the patient recently lost weight without trying?:  0 Has the patient been eating poorly because of a decreased appetite?: 0 Malnutrition Screening Tool Score: 0    Physical Exam  Physical Exam Vitals reviewed.  Constitutional:      Appearance: Normal appearance.  Cardiovascular:     Rate and Rhythm: Normal rate.  Pulmonary:     Effort: Pulmonary effort is normal.  Musculoskeletal:        General: Normal range of motion.  Neurological:     Mental Status: She is alert.    Review of Systems  Constitutional:  Negative for chills and fever.  Cardiovascular:  Negative for chest pain and palpitations.  Gastrointestinal:  Negative for nausea and vomiting.  Neurological:  Negative for dizziness and headaches.   Blood pressure 125/89, pulse 72, temperature 98.1 F (36.7 C), temperature source Oral, resp. rate 19, last menstrual period 08/02/2013, SpO2 96 %. There is no height or weight on file to calculate BMI.  Demographic Factors:  Divorced or widowed, Caucasian, Low socioeconomic status, Living alone, and Unemployed  Loss Factors: Loss of significant relationship  Historical Factors: Prior suicide attempts  Risk Reduction Factors:   Responsible for children under 17 years of age  Continued Clinical Symptoms:  Bipolar Disorder:   Depressive phase Alcohol/Substance Abuse/Dependencies  Cognitive Features That Contribute To Risk:  Closed-mindedness    Suicide Risk:  Mild:  Suicidal ideation of limited frequency, intensity, duration, and specificity.  There are no  identifiable plans, no associated intent, mild dysphoria and related symptoms, good self-control (both objective and subjective assessment), few other risk factors, and identifiable protective factors, including available and accessible social support.  Plan Of Care/Follow-up recommendations:  Activity as tolerated Regular diet Continue prescription medications See PCP for medical conditions   Disposition: Home with OP services for CDIOP  Lance Muss,  MD 03/26/2023, 10:47 AM

## 2023-03-26 NOTE — ED Notes (Signed)
Patient A&O x 4, ambulatory. Patient discharged in no acute distress. Patient denied SI/HI, A/VH upon discharge. Patient verbalized understanding of all discharge instructions explained by staff, to include follow up appointments, RX's and safety plan. Patient reported mood 10/10.  Pt belongings returned to patient from locker #30 intact. Patient escorted to lobby via staff for transport to destination. Safety maintained.  

## 2023-03-26 NOTE — ED Notes (Signed)
Patient A&Ox4. Patient denies SI/HI and AVH. Patient denies any physical complaints when asked. No acute distress noted. Support and encouragement provided. Routine safety checks conducted according to facility protocol. Encouraged patient to notify staff if thoughts of harm toward self or others arise. Patient verbalize understanding and agreement. Will continue to monitor for safety.    

## 2023-03-30 ENCOUNTER — Telehealth (HOSPITAL_COMMUNITY): Payer: Self-pay | Admitting: Licensed Clinical Social Worker

## 2023-03-30 NOTE — Telephone Encounter (Addendum)
The therapist attempts to reach Anjelita leaving a HIPAA-compliant voicemail with his direct contact number.   [address], MA, LCSW, [facility], LCAS 03/30/2023

## 2023-03-31 NOTE — Telephone Encounter (Signed)
Note in error.

## 2023-04-02 ENCOUNTER — Telehealth: Payer: MEDICAID | Admitting: Physician Assistant

## 2023-04-02 DIAGNOSIS — M10079 Idiopathic gout, unspecified ankle and foot: Secondary | ICD-10-CM | POA: Diagnosis not present

## 2023-04-02 MED ORDER — PREDNISONE 20 MG PO TABS
40.0000 mg | ORAL_TABLET | Freq: Every day | ORAL | 0 refills | Status: AC
Start: 2023-04-02 — End: ?

## 2023-04-02 NOTE — Progress Notes (Signed)
E-Visit for Gout Symptoms  We are sorry that you are not feeling well. We are here to help!  Based on what you shared with me it looks like you have a flare of your gout.  Gout is a form of arthritis. It can cause pain and swelling in the joints. At first, it tends to affect only 1 joint - most frequently the big toe. It happens in people who have too much uric acid in the blood. Uric acid is a chemical that is produced when the body breaks down certain foods. Uric acid can form sharp needle-like crystals that build up in the joints and cause pain. Uric acid crystals can also form inside the tubes that carry urine from the kidneys to the bladder. These crystals can turn into "kidney stones" that can cause pain and problems with the flow of urine. People with gout get sudden "flares" or attacks of severe pain, most often the big toe, ankle, or knee. Often the joint also turns red and swells. Usually, only 1 joint is affected, but some people have pain in more than 1 joint. Gout flares tend to happen more often during the night.  The pain from gout can be extreme. The pain and swelling are worst at the beginning of a gout flare. The symptoms then get better within a few days to weeks. It is not clear how the body "turns off" a gout flare.  Do not start any NEW preventative medicine until the gout has cleared completely. However, If you are already on Probenecid or Allopurinol for CHRONIC gout, you may continue taking this during an active flare up  I have prescribed Prednisone 40 mg daily for 7   HOME CARE Losing weight can help relieve gout. It's not clear that following a specific diet plan will help with gout symptoms but eating a balanced diet can help improve your overall health. It can also help you lose weight, if you are overweight. In general, a healthy diet includes plenty of fruits, vegetables, whole grains, and low-fat dairy products (labelled "low fat", skim, 2%). Avoid sugar sweetened  drinks (including sodas, tea, juice and juice blends, coffee drinks and sports drinks) Limit alcohol to 1-2 drinks of beer, spirits or wine daily these can make gout flares worse. Some people with gout also have other health problems, such as heart disease, high blood pressure, kidney disease, or obesity. If you have any of these issues, it's important to work with your doctor to manage them. This can help improve your overall health and might also help with your gout.  GET HELP RIGHT AWAY IF: Your symptoms persist after you have completed your treatment plan You develop severe diarrhea You develop abnormal sensations  You develop vomiting,   You develop weakness  You develop abdominal pain  FOLLOW UP WITH YOUR PRIMARY PROVIDER IF: If your symptoms do not improve within 10 days  MAKE SURE YOU  Understand these instructions. Will watch your condition. Will get help right away if you are not doing well or get worse.  Thank you for choosing an e-visit.  Your e-visit answers were reviewed by a board certified advanced clinical practitioner to complete your personal care plan. Depending upon the condition, your plan could have included both over the counter or prescription medications.  Please review your pharmacy choice. Make sure the pharmacy is open so you can pick up prescription now. If there is a problem, you may contact your provider through MyChart messaging and have   the prescription routed to another pharmacy.  Your safety is important to us. If you have drug allergies check your prescription carefully.   For the next 24 hours you can use MyChart to ask questions about today's visit, request a non-urgent call back, or ask for a work or school excuse. You will get an email in the next two days asking about your experience. I hope that your e-visit has been valuable and will speed your recovery.  I have spent 5 minutes in review of e-visit questionnaire, review and updating patient  chart, medical decision making and response to patient.   Jamieson Hetland M Macyn Remmert, [name]  

## 2023-04-03 ENCOUNTER — Encounter (HOSPITAL_COMMUNITY): Payer: Self-pay | Admitting: Licensed Clinical Social Worker

## 2023-04-03 ENCOUNTER — Telehealth (HOSPITAL_COMMUNITY): Payer: Self-pay | Admitting: Licensed Clinical Social Worker

## 2023-04-03 NOTE — Telephone Encounter (Signed)
The therapist attempts to reach Kristina Huffman in an effort to return her call; however, he reaches her voicemail with her voicemail box being full.  Thus, he sends her a letter via My Chart.  Myrna Blazer, MA, LCSW, [facility], LCAS 04/03/2023

## 2023-04-04 ENCOUNTER — Ambulatory Visit (HOSPITAL_COMMUNITY)
Admission: EM | Admit: 2023-04-04 | Discharge: 2023-04-04 | Disposition: A | Discharge status: Home / Self Care | Payer: MEDICAID | Attending: Physician Assistant | Admitting: Physician Assistant

## 2023-04-04 ENCOUNTER — Encounter (HOSPITAL_COMMUNITY): Payer: Self-pay

## 2023-04-04 ENCOUNTER — Ambulatory Visit (HOSPITAL_COMMUNITY): Payer: MEDICAID

## 2023-04-04 DIAGNOSIS — L03031 Cellulitis of right toe: Secondary | ICD-10-CM

## 2023-04-04 DIAGNOSIS — M79674 Pain in right toe(s): Secondary | ICD-10-CM | POA: Diagnosis not present

## 2023-04-04 DIAGNOSIS — M7989 Other specified soft tissue disorders: Secondary | ICD-10-CM

## 2023-04-04 MED ORDER — DOXYCYCLINE HYCLATE 100 MG PO CAPS: 100.0000 mg | ORAL_CAPSULE | Freq: Two times a day (BID) | ORAL | 0 refills | Status: DC

## 2023-04-04 NOTE — ED Triage Notes (Addendum)
Patient states she did an E-visit 2 days ago and was told to take Prednisone prior to taking gout medicine. Patient states the Prednisone ws delivered today, Patient states she came to the UC because she had not started the Prednisone and "did not want to lose her toe." Patient has redness and swelling to the right great toe.

## 2023-04-04 NOTE — ED Provider Notes (Signed)
[facility]    CSN: [id number] Arrival date & time: 04/04/23  1725      History   Chief Complaint Chief Complaint  Patient presents with   Toe Pain    HPI Kristina Huffman is a 62 y.o. female.   Patient presents today with a several day history of right great toe pain.  She initially had a e-visit because she was concerned about gout and they prescribed prednisone.  She has not yet started this as it was only delivered today.  She reports that the pain has improved but she continues to have significant swelling and redness prompting evaluation.  She denies any known injury to the toe.  Pain is rated 5 on a 0-10 pain scale, described as throbbing, no aggravating or relieving factors identified.  She denies any recent dietary or medication changes.  She reports she is prediabetic but denies formal diagnosis of diabetes.  She has had MRSA approximately 10 years ago.  Denies any recent antibiotics.  She states current symptoms are different than her typical gout episodes.    Past Medical History:  Diagnosis Date   Aneurysm of splenic artery (HCC)    Anxiety    Asthma    Bipolar disorder (HCC)    Depression    Elevated LFTs    ETOH abuse    GERD (gastroesophageal reflux disease)    Hepatitis C    History of MRSA infection    legs and spread to face   Hypertension    Insomnia    Mixed hyperlipidemia    OSA (obstructive sleep apnea)    Sleep apnea     Patient Active Problem List   Diagnosis Date Noted   Alcohol addiction (HCC) 04/07/2022   Obstructive sleep apnea syndrome 05/08/2020   Acute respiratory failure due to COVID-19 (HCC) 10/14/2019   ARF (acute renal failure) (HCC) 10/14/2019   Essential hypertension 10/14/2019   Controlled type 2 diabetes mellitus with hyperglycemia (HCC) 10/14/2019   Alcohol use disorder, severe, dependence (HCC) 06/06/2018   Deviated nasal septum 12/03/2017   Mixed hyperlipidemia 11/29/2017   Gastroesophageal reflux disease  10/06/2017   Chronic hepatitis C without hepatic coma (HCC) 06/04/2016   Substance induced mood disorder (HCC) 09/20/2013   Benzodiazepine dependence (HCC) 09/21/2011   Bipolar 1 disorder, mixed, moderate (HCC) 09/21/2011   PTSD (post-traumatic stress disorder) 09/21/2011    Past Surgical History:  Procedure Laterality Date   BACK SURGERY     RADIAL HEAD ARTHROPLASTY  08/09/2012   Procedure: RADIAL HEAD ARTHROPLASTY;  Surgeon: Marlowe Shores, MD;  Location: [facility];  Service: Orthopedics;  Laterality: Left;  Left Radial head Replacement    OB History     Gravida  8   Para  4   Term  4   Preterm      AB  4   Living  4      SAB  1   IAB  3   Ectopic      Multiple      Live Births  4            Home Medications    Prior to Admission medications   Medication Sig Start Date End Date Taking? Authorizing Provider  doxycycline (VIBRAMYCIN) 100 MG capsule Take 1 capsule (100 mg total) by mouth 2 (two) times daily. 04/04/23  Yes Laiden Milles, Noberto Retort, [name]  ACCU-CHEK GUIDE test strip test ONCE EVERY DAY 11/27/22   [provider]  Accu-Chek Softclix Lancets  lancets daily. 11/14/22   [provider]  albuterol (VENTOLIN HFA) 108 (90 Base) MCG/ACT inhaler Inhale 2 puffs into the lungs every 6 (six) hours as needed for wheezing or shortness of breath. 11/13/22   Viviano Simas, FNP  atorvastatin (LIPITOR) 40 MG tablet Take 1 tablet (40 mg total) by mouth at bedtime. 04/09/22 05/09/22  Park Pope, MD  Blood Glucose Monitoring Suppl (ACCU-CHEK GUIDE ME) w/Device KIT daily. as directed 10/23/22   [provider]  Cholecalciferol (VITAMIN D3) 50 MCG (2000 UT) TABS Take 2,000 mcg by mouth daily. 04/09/22   Park Pope, MD  colchicine 0.6 MG tablet Take 0.6 mg by mouth daily as needed (For gout).    [provider]  DENTA 5000 PLUS 1.1 % CREA dental cream Take by mouth 2 (two) times daily. 08/19/22   [provider]  Docusate Sodium (DSS) 100 MG  CAPS Take 1 capsule every day by oral route at bedtime for 30 days. 05/01/21   [provider]  escitalopram (LEXAPRO) 10 MG tablet Take 1 tablet orally at bedtime 11/03/22   [provider]  gabapentin (NEURONTIN) 300 MG capsule Take 2 capsules (600 mg total) by mouth at bedtime. 04/10/22 12/25/22  Park Pope, MD  hydrochlorothiazide (HYDRODIURIL) 25 MG tablet Take 1 tablet (25 mg total) by mouth daily. 04/09/22 12/25/22  Park Pope, MD  ondansetron (ZOFRAN) 4 MG tablet Take 4 mg by mouth every 6 (six) hours as needed for nausea or vomiting.    [provider]  pantoprazole (PROTONIX) 40 MG tablet Take 1 tablet (40 mg total) by mouth daily. 11/17/22   Meryl Dare, MD  predniSONE (DELTASONE) 20 MG tablet Take 2 tablets (40 mg total) by mouth daily with breakfast. 04/02/23   Margaretann Loveless, [name]  QUEtiapine (SEROQUEL) 100 MG tablet Take 1 tablet (100 mg total) by mouth daily. Take 1 tablet at 5 PM by mouth daily 04/09/22 12/25/22  Park Pope, MD  QUEtiapine (SEROQUEL) 200 MG tablet Take 1 tablet (200 mg total) by mouth at bedtime. Patient taking differently: Take 300 mg by mouth at bedtime. 04/09/22   Park Pope, MD  traZODone (DESYREL) 100 MG tablet Take 1 tablet (100 mg total) by mouth at bedtime. 04/09/22 12/25/22  Park Pope, MD  valACYclovir (VALTREX) 1000 MG tablet Take 1 tablet every 12 hours by oral route for 2 days. 06/10/22   [provider]  metFORMIN (GLUCOPHAGE) 500 MG tablet Take 1 tablet (500 mg total) by mouth 2 (two) times daily with a meal. For diabetes management 06/09/18 11/30/19  Sanjuana Kava, NP    Family History Family History  Problem Relation Age of Onset   Depression Mother    Osteoporosis Mother    COPD Mother    Rheum arthritis Mother    Diabetes Father    Hypertension Father    Congestive Heart Failure Father    Aneurysm Father    Clotting disorder Father    Heart disease Father    Cancer Maternal Grandfather    Colon cancer Neg Hx     Esophageal cancer Neg Hx    Rectal cancer Neg Hx    Stomach cancer Neg Hx     Social History Social History   Tobacco Use   Smoking status: Never   Smokeless tobacco: Never  Vaping Use   Vaping status: Never Used  Substance Use Topics   Alcohol use: Not Currently   Drug use: Not Currently    Types: Heroin  Comment: no drug use for 3 years     Allergies   Sulfa antibiotics and Aspirin   Review of Systems Review of Systems  Constitutional:  Positive for activity change. Negative for appetite change, fatigue and fever.  Gastrointestinal:  Negative for abdominal pain, diarrhea, nausea and vomiting.  Musculoskeletal:  Negative for arthralgias and myalgias.  Skin:  Positive for color change. Negative for wound.  Neurological:  Negative for weakness and numbness.     Physical Exam Triage Vital Signs ED Triage Vitals [04/04/23 1759]  Encounter Vitals Group     BP 112/77     Systolic BP Percentile      Diastolic BP Percentile      Pulse Rate 79     Resp 16     Temp 98.4 F (36.9 C)     Temp Source Oral     SpO2 93 %     Weight      Height      Head Circumference      Peak Flow      Pain Score      Pain Loc      Pain Education      Exclude from Growth Chart    No data found.  Updated Vital Signs BP 112/77 (BP Location: Left Arm)   Pulse 79   Temp 98.4 F (36.9 C) (Oral)   Resp 16   LMP 08/02/2013 Comment: irregular  SpO2 93%   Visual Acuity Right Eye Distance:   Left Eye Distance:   Bilateral Distance:    Right Eye Near:   Left Eye Near:    Bilateral Near:     Physical Exam Vitals reviewed.  Constitutional:      General: She is awake. She is not in acute distress.    Appearance: Normal appearance. She is well-developed. She is not ill-appearing.     Comments: Very pleasant female appears stated age in no acute distress sitting comfortably in exam room  HENT:     Head: Normocephalic and atraumatic.  Cardiovascular:     Rate and Rhythm:  Normal rate and regular rhythm.     Pulses:          Posterior tibial pulses are 2+ on the right side and 2+ on the left side.     Heart sounds: Normal heart sounds, S1 normal and S2 normal. No murmur heard.    Comments: Capillary refill within 2 seconds right great toe Pulmonary:     Effort: Pulmonary effort is normal.     Breath sounds: Normal breath sounds. No wheezing, rhonchi or rales.     Comments: Clear to auscultation bilaterally Musculoskeletal:     Right foot: Normal range of motion. No deformity or bunion.  Feet:     Right foot:     Protective Sensation: 10 sites tested.  10 sites sensed.     Skin integrity: Erythema present. No ulcer, blister or skin breakdown.     Toenail Condition: Right toenails are normal.     Comments: Right great toe: Significant erythema over distal great toe.  Mild tenderness palpation.  Normal active range of motion.  Foot neurovascularly intact.  Minimal warmth. Psychiatric:        Behavior: Behavior is cooperative.      UC Treatments / Results  Labs (all labs ordered are listed, but only abnormal results are displayed) Labs Reviewed - No data to display  EKG   Radiology No results found.  Procedures Procedures (including critical  care time)  Medications Ordered in UC Medications - No data to display  Initial Impression / Assessment and Plan / UC Course  I have reviewed the triage vital signs and the nursing notes.  Pertinent labs & imaging results that were available during my care of the patient were reviewed by me and considered in my medical decision making (see chart for details).     Patient is well-appearing, afebrile, nontoxic, nontachycardic.  Foot is neurovascularly intact.  My read of x-ray did not show any evidence of osteomyelitis.  We were still waiting on radiologist read at the time of discharge we will contact her if this differs from my first read and we need to change her plan.  Given appearance and improvement  of pain despite spreading of erythema I am more concerned about cellulitis than gout.  Recommended that she not start prednisone but can use over-the-counter medication such as Tylenol ibuprofen for pain.  Will start doxycycline 100 mg twice daily for 10 days given her history of MRSA.  Discussed that she is to avoid prolonged sun exposure while on this medication due to associated photosensitivity.  Recommended close follow-up with podiatry and she was given contact information for local provider with instruction to call to schedule an appointment for Kristina Huffman next week.  Discussed that if anything worsens and she has continued spread of redness, increasing pain, fever, nausea, vomiting she needs to go to the emergency room.  Strict return precautions given.  Work excuse note provided.  Final Clinical Impressions(s) / UC Diagnoses   Final diagnoses:  Cellulitis of right toe  Pain and swelling of toe of right foot     Discharge Instructions      Your x-ray was normal with no evidence of infection in the bone.  I am more concerned about an infection like gout given how your toe appears today.  I would not start the prednisone.  Please start doxycycline 100 mg twice daily for 10 days.  Stay out of the sun while on this medication.  I would like you to follow-up with podiatry.  Call them to schedule an appointment first thing next week.  If anything worsens and you have increasing pain, swelling, the redness spreads, fever, nausea, vomiting you need to go to the emergency room immediately.     ED Prescriptions     Medication Sig Dispense Auth. Provider   doxycycline (VIBRAMYCIN) 100 MG capsule Take 1 capsule (100 mg total) by mouth 2 (two) times daily. 20 capsule Beauregard Jarrells, Noberto Retort, [name]      PDMP not reviewed this encounter.   Jeani Hawking, [name] 04/04/23 1902

## 2023-04-04 NOTE — Discharge Instructions (Addendum)
Your x-ray was normal with no evidence of infection in the bone.  I am more concerned about an infection like gout given how your toe appears today.  I would not start the prednisone.  Please start doxycycline 100 mg twice daily for 10 days.  Stay out of the sun while on this medication.  I would like you to follow-up with podiatry.  Call them to schedule an appointment first thing next week.  If anything worsens and you have increasing pain, swelling, the redness spreads, fever, nausea, vomiting you need to go to the emergency room immediately.

## 2023-04-14 ENCOUNTER — Ambulatory Visit
Admission: RE | Admit: 2023-04-14 | Discharge: 2023-04-14 | Disposition: A | Discharge status: Home / Self Care | Payer: MEDICAID | Source: Ambulatory Visit | Attending: Internal Medicine | Admitting: Internal Medicine

## 2023-04-14 DIAGNOSIS — Z1231 Encounter for screening mammogram for malignant neoplasm of breast: Secondary | ICD-10-CM

## 2023-04-17 ENCOUNTER — Other Ambulatory Visit: Payer: Self-pay | Admitting: Internal Medicine

## 2023-04-17 DIAGNOSIS — R928 Other abnormal and inconclusive findings on diagnostic imaging of breast: Secondary | ICD-10-CM

## 2023-04-21 ENCOUNTER — Telehealth (HOSPITAL_COMMUNITY): Payer: Self-pay | Admitting: Licensed Clinical Social Worker

## 2023-04-21 NOTE — Telephone Encounter (Signed)
The therapist returns Kristina Huffman's call confirming her identity via two identifiers. She says that she has not drunk since talking with Ms. Veto Kemps, LMFT, LCAS and that she is attending AA though she does not like it.  She schedules to come in for an assessment for SA IOP on 04/27/2023 at 1 p.m.   Myrna Blazer, MA, LCSW, [facility], LCAS 04/21/2023

## 2023-04-22 ENCOUNTER — Telehealth (HOSPITAL_COMMUNITY): Payer: Self-pay

## 2023-04-22 NOTE — Telephone Encounter (Signed)
Therapist calls Poetry as she left a message on VM asking for a return call regarding engaging in services.  Therapist verified her identify by obtaining two verifiers.  Camil says she has seen Annette Stable Garrot in the past and she wants to get engage in substance use services.  Therapist asked when Marlin last drank and she said she drank last evening.  She reports she drank a couple of glasses of wine.  Jose says the last time she drank before 04/20/23 was a week ago.  Therapist said she would consult with her co-worker and one of [place] would call her back.

## 2023-04-27 ENCOUNTER — Ambulatory Visit (INDEPENDENT_AMBULATORY_CARE_PROVIDER_SITE_OTHER): Payer: MEDICAID | Admitting: Licensed Clinical Social Worker

## 2023-04-27 DIAGNOSIS — F4310 Post-traumatic stress disorder, unspecified: Secondary | ICD-10-CM

## 2023-04-27 DIAGNOSIS — F319 Bipolar disorder, unspecified: Secondary | ICD-10-CM

## 2023-04-27 DIAGNOSIS — F1020 Alcohol dependence, uncomplicated: Secondary | ICD-10-CM

## 2023-04-27 NOTE — Progress Notes (Signed)
The therapist along with Ms. [name], LMFT, LCAS meets with Kristina Huffman to obtain interim clinical information and go over the format and rules for SA IOP.  Kristina Huffman says that she has not drunk alcohol since 04/20/23 and her BAC today is .00. She is attending AA; however, she admits that she has not made any connections as of yet or gotten a Marketing executive. She says that Sponsors in the past have suggested that she needs to see a therapist likely as she has past trauma issues that surface when she attempts to get sober.   In doing her Crisis Plan, she indicates that the time she has had thoughts of suicide is when she drinks but that she has never thought of the means; however, she has a history that is positive for overdosing. She denies any current SI or HI.   She says that she is looking into volunteering and needs to develop some sort of activities that get her out of the house such that he does not isolate. She asks about a program involving [facility] based on her contact with [facility] back in the early 2000s. The therapist talks to her about the [facility] and [facility] as a possible part of her discharge plan.  The therapist makes her aware of the no cell phone policy for group and the expectation that she will call if absent from group and the fact that the program is abstinence-based with weekly UDS. The therapist informs her that clients are to alert the therapist and group to any changes in their sobriety and to attend three Twelve Step meetings per week and obtain a Sponsor.  Kristina Huffman will start SA IOP on 04/29/23. The therapist attempts to get her vitals checked today; however, no Nurse is available such that they will have to be checked on 04/29/23 at the start of group.  Myrna Blazer, MA, LCSW, [facility], LCAS 04/27/2023

## 2023-04-28 ENCOUNTER — Other Ambulatory Visit: Payer: Self-pay | Admitting: Internal Medicine

## 2023-04-28 ENCOUNTER — Ambulatory Visit
Admission: RE | Admit: 2023-04-28 | Discharge: 2023-04-28 | Disposition: A | Discharge status: Home / Self Care | Payer: MEDICAID | Source: Ambulatory Visit | Attending: Internal Medicine | Admitting: Internal Medicine

## 2023-04-28 DIAGNOSIS — R928 Other abnormal and inconclusive findings on diagnostic imaging of breast: Secondary | ICD-10-CM

## 2023-04-28 DIAGNOSIS — N6312 Unspecified lump in the right breast, upper inner quadrant: Secondary | ICD-10-CM

## 2023-04-29 ENCOUNTER — Ambulatory Visit (INDEPENDENT_AMBULATORY_CARE_PROVIDER_SITE_OTHER): Payer: MEDICAID | Admitting: Medical

## 2023-04-29 ENCOUNTER — Ambulatory Visit: Payer: Medicaid Other | Admitting: Podiatry

## 2023-04-29 DIAGNOSIS — F319 Bipolar disorder, unspecified: Secondary | ICD-10-CM | POA: Diagnosis not present

## 2023-04-29 DIAGNOSIS — F1020 Alcohol dependence, uncomplicated: Secondary | ICD-10-CM

## 2023-04-29 DIAGNOSIS — F4310 Post-traumatic stress disorder, unspecified: Secondary | ICD-10-CM | POA: Diagnosis not present

## 2023-04-29 MED ORDER — BACLOFEN 10 MG PO TABS: 10.0000 mg | ORAL_TABLET | Freq: Three times a day (TID) | ORAL | 1 refills | Status: AC

## 2023-04-29 NOTE — Progress Notes (Signed)
Psychiatric Initial Adult Assessment   Patient Identification: Kristina Huffman MRN:  [id number] Date of Evaluation:  04/29/2023 Referral Source: [facility] (She is wanting to pursue outpatient services, specifically CDIOP with Myrna Blazer. ) Chief Complaint:   Chief Complaint  Patient presents with   Establish Care   Alcohol Problem   Family Problem   Depression   Anxiety   Trauma   Stress   Visit Diagnosis:    ICD-10-CM   1. Alcohol use disorder, severe, dependence (HCC)  F10.20     2. PTSD (post-traumatic stress disorder)  F43.10     3. Bipolar affective disorder, remission status unspecified (HCC)  F31.9       History of Present Illness:   FBC/OBS ASAP Discharge Summary   Date and Time: 03/26/2023 10:47 AM  Name: Kristina Huffman  MRN:  [id number]    Discharge Diagnoses:  Final diagnoses:  Substance induced mood disorder (HCC)  Alcohol abuse  Bipolar disorder, in partial remission, most recent episode depressed (HCC)      Subjective: Wilma Boado is a 62 year old female with psychiatric history of bipolar disorder, alcohol use disorder, and anxiety who presented to [facility] voluntarily seeking outpatient alcohol detox treatment and initially reported passive SI.   Patient was seen in exam room with Dr. Lucianne Muss. Patient is pleasant and cooperative this morning. She reports drinking a bottle and a half of wine daily since a break up that occurred weeks ago. She started drinking in 2017. She has a previous period of sobriety for 9 months and she attributes this to living in a [place] based home. She feels church is her support system. She denies having a sponsor and does not want to attend AA meetings as she feels "those don't work for me". At this time, she is hesitant in completing detox at [facility] stating "well I dont think my front door to my home is locked and nobody can help me". She is wanting to pursue outpatient services, specifically CDIOP with Myrna Blazer. She notes talking to  him prior to coming and was interested in doing CDIOP but she was advised to complete detox first. She denies SI, HI, and AVH at this time.    Patient has risk factors for relapsing on alcohol including lack of social support (lives alone), separated from husband, history of DSS with her daughter living in [place] with her cousin, loss of son in 2012, and chronic alcohol use. At this time, she will pursue CDIOP and was encouraged to seek more intensive care that includes detox when she is ready. Patient was provided contact information for Myrna Blazer on discharge.                  Past Medical History:  Past Medical History:  Diagnosis Date   Aneurysm of splenic artery (HCC)    Anxiety    Asthma    Bipolar disorder (HCC)    Depression    Elevated LFTs    ETOH abuse    GERD (gastroesophageal reflux disease)    Hepatitis C Patient with history of hepatitis C that was treated in 2017 with sustained virologic response and cure.      History of MRSA infection    legs and spread to face   Hypertension    Insomnia    Mixed hyperlipidemia    OSA (obstructive sleep apnea)    * Sleep apnea SLD/MetALD     Past Surgical History:  Procedure Laterality Date   BACK SURGERY  RADIAL HEAD ARTHROPLASTY  08/09/2012   Procedure: RADIAL HEAD ARTHROPLASTY;  Surgeon: Marlowe Shores, MD;  Location: [facility] OR;  Service: Orthopedics;  Laterality: Left;  Left Radial head Replacement    Family Psychiatric History:   Family History:  Family History  Problem Relation Age of Onset   Depression Mother    Osteoporosis Mother    COPD Mother    Rheum arthritis Mother    Diabetes Father    Hypertension Father    Congestive Heart Failure Father    Aneurysm Father    Clotting disorder Father    Heart disease Father    Cancer Maternal Grandfather    Colon cancer Neg Hx    Esophageal cancer Neg Hx    Rectal cancer Neg Hx    Stomach cancer Neg Hx     Social History:   Social  History   Socioeconomic History   Marital status: Legally Separated    Spouse name: Not on file   Number of children: 4   Years of education: Not on file   Highest education level: Not on file  Occupational History   Not on file  Tobacco Use   Smoking status: Never   Smokeless tobacco: Never  Vaping Use   Vaping status: Never Used  Substance and Sexual Activity   Alcohol use: Not Currently   Drug use: Not Currently    Types: Heroin    Comment: no drug use for 3 years   Sexual activity: Yes    Birth control/protection: None  Other Topics Concern   Not on file  Social History Narrative   Not on file   Social Determinants of Health   Financial Resource Strain: Not on file  Food Insecurity: High Risk (03/02/2023)   [facility]   Food vital sign    Within the past 12 months, you worried that your food would run out before you got money to buy more: Often true    Within the past 12 months, the food you bought just didn't last and you didn't have money to get more. : Often true  Transportation Needs: Not on file (03/02/2023)  Physical Activity: Not on file  Stress: Not on file  Social Connections: Unknown (02/03/2022)   Received from [facility]   Social Network    Social Network: Not on file    Additional Social History:  Social history:07/01/2022 Patient denies history of injectable or intranasal drug use, tattoos, high risk sexual behavior, blood transfusion, incarceration, Financial planner. She is originally from [place] and denies any other travel beyond the [place]. She is currently disabled and previously worked in Clinical biochemist. She is separated and lives with her brother.  Allergies:   Allergies  Allergen Reactions   Sulfa Antibiotics Shortness Of Breath, Swelling and Other (See Comments)    Tight in throat, facial swelling   Aspirin Other (See Comments)    "Ringing in ears"    Metabolic Disorder Labs: Lab Results   Component Value Date   HGBA1C 5.7 (H) 03/25/2023   MPG 116.89 03/25/2023   MPG 116.89 04/06/2022   No results found for: "PROLACTIN" Lab Results  Component Value Date   CHOL 180 03/25/2023   TRIG 416 (H) 03/25/2023   HDL 51 03/25/2023   CHOLHDL 3.5 03/25/2023   VLDL UNABLE TO CALCULATE IF TRIGLYCERIDE OVER 400 mg/dL 16/06/9603   LDLCALC UNABLE TO CALCULATE IF TRIGLYCERIDE OVER 400 mg/dL 54/05/8118   LDLCALC 34 04/06/2022  Lab Results  Component Value Date   TSH 3.277 03/25/2023    Therapeutic Level Labs: Lab Results  Component Value Date   LITHIUM <0.25 (L) 07/03/2013   No results found for: "CBMZ" No results found for: "VALPROATE"  EXAM: MRI ABDOMEN WITHOUT AND WITH CONTRAST 12/17/2021  TECHNIQUE: Multiplanar multisequence MR imaging of the abdomen was performed both before and after the administration of intravenous contrast.  CONTRAST: 20mL MULTIHANCE GADOBENATE DIMEGLUMINE 529 MG/ML IV SOLN  COMPARISON: Abdominal ultrasound 10/17/2021, CT abdomen and pelvis 04/11/2020, MRI abdomen 12/03/2017  FINDINGS: Lower chest: No acute findings.  Hepatobiliary: Liver is enlarged measuring 19.2 cm in length and there is evidence of hepatic steatosis. Stable 2.5 cm cyst in the left hepatic lobe segment 2, and a 2.1 cm cyst with thin internal septation at the anterior aspect of the left hepatic lobe segment 3 which is stable in size since previous ultrasound and decreased in size since previous MRI when it measured up to 6.2 cm. A few additional tiny scattered hepatic cysts noted. No suspicious hepatic mass visualized. Gallbladder appears normal. No biliary ductal dilatation.  Pancreas: No mass, inflammatory changes, or other parenchymal abnormality identified.  Spleen: Within normal limits in size and appearance.  Adrenals/Urinary Tract: Adrenal glands are normal. A few tiny renal cortical cysts bilaterally. No enhancing renal mass or hydronephrosis  visualized.  Stomach/Bowel: No evidence of bowel obstruction.  Vascular/Lymphatic: No pathologically enlarged lymph nodes identified. No abdominal aortic aneurysm demonstrated.  Other: No ascites.  Musculoskeletal: No suspicious bone lesions identified.  IMPRESSION: 1. Hepatomegaly and hepatic steatosis. 2. A few hepatic cysts including a mildly complex cyst at the anterior left hepatic lobe.  Current Medications: Current Outpatient Medications  Medication Sig Dispense Refill   baclofen (LIORESAL) 10 MG tablet Take 1 tablet (10 mg total) by mouth 3 (three) times daily. 90 tablet 1   ACCU-CHEK GUIDE test strip test ONCE EVERY DAY     Accu-Chek Softclix Lancets lancets daily.     albuterol (VENTOLIN HFA) 108 (90 Base) MCG/ACT inhaler Inhale 2 puffs into the lungs every 6 (six) hours as needed for wheezing or shortness of breath. 8 g 0   atorvastatin (LIPITOR) 40 MG tablet Take 1 tablet (40 mg total) by mouth at bedtime. 30 tablet 0   Blood Glucose Monitoring Suppl (ACCU-CHEK GUIDE ME) w/Device KIT daily. as directed     Cholecalciferol (VITAMIN D3) 50 MCG (2000 UT) TABS Take 2,000 mcg by mouth daily. 30 tablet 0   colchicine 0.6 MG tablet Take 0.6 mg by mouth daily as needed (For gout).     DENTA 5000 PLUS 1.1 % CREA dental cream Take by mouth 2 (two) times daily.     Docusate Sodium (DSS) 100 MG CAPS Take 1 capsule every day by oral route at bedtime for 30 days.     doxycycline (VIBRAMYCIN) 100 MG capsule Take 1 capsule (100 mg total) by mouth 2 (two) times daily. 20 capsule 0   escitalopram (LEXAPRO) 10 MG tablet Take 1 tablet orally at bedtime     gabapentin (NEURONTIN) 300 MG capsule Take 2 capsules (600 mg total) by mouth at bedtime. 60 capsule 0   hydrochlorothiazide (HYDRODIURIL) 25 MG tablet Take 1 tablet (25 mg total) by mouth daily. 30 tablet 0   ondansetron (ZOFRAN) 4 MG tablet Take 4 mg by mouth every 6 (six) hours as needed for nausea or vomiting.     pantoprazole  (PROTONIX) 40 MG tablet Take 1 tablet (40 mg total)  by mouth daily. 30 tablet 0   predniSONE (DELTASONE) 20 MG tablet Take 2 tablets (40 mg total) by mouth daily with breakfast. 14 tablet 0   QUEtiapine (SEROQUEL) 100 MG tablet Take 1 tablet (100 mg total) by mouth daily. Take 1 tablet at 5 PM by mouth daily 30 tablet 0   QUEtiapine (SEROQUEL) 200 MG tablet Take 1 tablet (200 mg total) by mouth at bedtime. (Patient taking differently: Take 300 mg by mouth at bedtime.) 30 tablet 0   traZODone (DESYREL) 100 MG tablet Take 1 tablet (100 mg total) by mouth at bedtime. 30 tablet 0   valACYclovir (VALTREX) 1000 MG tablet Take 1 tablet every 12 hours by oral route for 2 days.     No current facility-administered medications for this visit.   AIMS    Flowsheet Row [facility]) [facility]  AIMS Total Score 0      AUDIT    Flowsheet Row [facility]) [facility] Ad[facility] (Discharged) [facility] Hosp-Admission (Discharged) [facility]  Alcohol Use Disorder Identification Test Final Score (AUDIT) 33 23 33      GAD-7    Flowsheet Row Office Visit from 12/25/2022 in [facility] [employer] [facility] Office Visit from 11/12/2020 in [facility] [employer] [facility] Office Visit from [facility]18/2017 [facility]  Total GAD-7 Score 21 0 7      PHQ2-9    Flowsheet Row Counselor from 01/08/2023 in [facility] Visit from 12/25/2022 [facility] ED from 04/06/2022 in [facility] Office Visit from 11/12/2020 in [facility] Office Visit from 03/18/2017 in [facility]  PHQ-2 Total Score 4 5 1 3 5   PHQ-9 Total Score 13 23 17 5 18       Flowsheet Row ED from 04/04/2023 in [facility] [place] Most recent reading at 04/04/2023  6:04 PM ED from 03/25/2023 in [facility] Most recent reading at 03/25/2023  9:59 PM ED from 03/25/2023 in [facility] [place] Most recent reading at 03/25/2023  6:05 PM  C-SSRS RISK CATEGORY No Risk Low Risk No Risk       Assessment   and Plan:  Treatment Plan/Recommendations:  Plan of Care: SUDs/Core issues [facility] CDIOP see Counselor's individualized treatment plan  Laboratory:  UDS  Psychotherapy: CD IOP Group,Individual and Family  Medications: See list  Routine PRN Medications:  Negative  Consultations: NA  Safety Concerns: RISK ASSESSMENT -Negative  Other:  Anatomy and Biology of addiction reviewed with Google (Pictures of Pet Scans Of Addicted Brains) and You Tube (Baclofen reduces cravings)   Collaboration of Care: Psychiatrist AEB    Patient/Guardian was advised Release of Information must be obtained prior to any record release in order to collaborate their care with an outside provider. Patient/Guardian was advised if they have not already done so to contact the registration department to sign all necessary forms in order for [place] to release information regarding their care.   Consent: Patient/Guardian gives verbal consent for treatment and assignment of benefits for services provided during this visit. Patient/Guardian  expressed understanding and agreed to proceed.   Maryjean Morn, [name] 8/7/202410:58 AM

## 2023-04-29 NOTE — Progress Notes (Signed)
Daily Group Progress Note   Program: CD IOP     Group Time: 9 a.m. to 12 p.m.    Type of Therapy: Process and Psychoeducational    Topic: The therapists check in with group members, assess for SI/HI/psychosis and overall level of functioning. The therapists inquire about sobriety date and number of community support meetings attended since last session.      The therapists discuss  addiction as a medical disease, specifically how cravings are a symptom of a medical disease, rather than a character defect. Therapists discuss when addicts use alcohol and drugs to number themselves out, they maintain schedules that are not healthy and when they stop using they can have more healthy schedules. Therapists discuss that in order to enjoy early recovery while exploring activities to participate in or reconnecting to an activity they enjoyed prior to active addiction.   Summary: This is Nikeisha's first day in [place]. She rates her depression as a "8" and her anxiety as a "9".  Glena shares that she started drinking early at the age of 34, so she did not learn how to engage in fun activities without alcohol.  Therapist asks if she knows what the "rat brain" is and she reports the P.A. that she meet with explains it to her but she was not sure she understood it fully. Therapist discusses the functioning of the brain in early recovery.   Progress Towards Goals:  Synthia reports no alcohol use.    UDS collected:   Yes   Results: No   AA/NA attended?: Yes   Sponsor?: No     Myrna Blazer, MA, LCSW, LCMHC, LCAS [name], MS, LMFT, LCAS 8/7//2024  I

## 2023-04-30 ENCOUNTER — Ambulatory Visit: Payer: MEDICAID | Admitting: Podiatry

## 2023-05-01 ENCOUNTER — Ambulatory Visit (INDEPENDENT_AMBULATORY_CARE_PROVIDER_SITE_OTHER): Payer: MEDICAID

## 2023-05-01 DIAGNOSIS — F1020 Alcohol dependence, uncomplicated: Secondary | ICD-10-CM

## 2023-05-01 DIAGNOSIS — F4310 Post-traumatic stress disorder, unspecified: Secondary | ICD-10-CM

## 2023-05-01 DIAGNOSIS — F3132 Bipolar disorder, current episode depressed, moderate: Secondary | ICD-10-CM

## 2023-05-01 NOTE — Progress Notes (Addendum)
Daily Group Progress Note   Program: CD IOP     Group Time: 9 a.m. to 12 p.m.    Type of Therapy: Process and Psychoeducational    Topic: The therapists check in with group members, assess for SI/HI/psychosis and overall level of functioning. The therapists inquire about sobriety date and number of community support meetings attended since last session.     Therapists observe that people relapse often claiming that they did so for no particular reason; however, therapists illustrate how this is not true per the fundamental principle of behavior therapy; "all behavior is purposeful." Therapists explain that people in active addiction often use substances to get rid of what they perceive to be "bad" emotions; however, therapists debunk the myth of bad emotions pointing out that that emotions are neither good or bad nor are they right or wrong but that emotions are like indicators or guide posts telling a person what to do. For example, anxiety tells people that they need to slow down or lighten their load while depression tells them that they are lacking enough pleasurable activities or positive social interactions.  Therapists illustrate how addiction is a disease that impacts the entire family system explaining family systems theory and why family therapy is a necessary part of addiction treatment. Therapists also discuss how one's psychiatric and/or substance use diagnosis can be used as a weapon by dysfunctional family members trying to silence them. Lastly, therapists discuss the importance of "coming out" as an alcoholic or addict and how this connects to taking the First Step of the Twelve Steps. The therapists discuss  addiction as a medical disease, specifically how cravings are a symptom of a medical disease, rather than a character defect. Therapists discuss when addicts use alcohol and drugs to number themselves out, they maintain schedules that are not healthy and when they stop using they can  have more healthy schedules. Therapists discuss that in order to enjoy early recovery while exploring activities to participate in or reconnecting to an activity they enjoyed prior to active addiction.   Summary: Kristina Huffman rates her depression as a "4" and her anxiety as a "8".   She identifies her emotions as "disappointed and grateful and angry"   She reports the same sobriety date. Kristina Huffman says she checks in with her sponsor each day and they are getting acquainted but is not yet working on steps.  Kristina Huffman shares that she is disappointed  and angry because she has not seen her daughter since she was 8 years old and now her daughter is 91 yrs old.  Kristina Huffman says her daughter lives with her cousin.  Kristina Huffman shares that her daughter has seen information Kristina Huffman posted on facebook while she was in active addiction. Kristina Huffman says she was hoping her daughter would not see these postings and was hoping her cousin would be a more reasonable thinking person and explain the situation to her daughter so she would understand the situation in which she made these posts.  Kristina Huffman says now that her daughter is aware of this information she does not know if she will come to visit on her summer vacation.  Kristina Huffman says her take away today is that she now realizes she has experienced being overwhelmed and what she does.   Progress Towards Goals:  Kristina Huffman reports no alcohol use.    UDS collected:   Yes   Results: No   AA/NA attended?: Yes   Sponsor?: Yes      [name], MS, LMFT,  [address] [name], [facility], LCAS 05/01/2023

## 2023-05-04 ENCOUNTER — Ambulatory Visit (INDEPENDENT_AMBULATORY_CARE_PROVIDER_SITE_OTHER): Payer: MEDICAID

## 2023-05-04 DIAGNOSIS — F4310 Post-traumatic stress disorder, unspecified: Secondary | ICD-10-CM

## 2023-05-04 DIAGNOSIS — F3132 Bipolar disorder, current episode depressed, moderate: Secondary | ICD-10-CM

## 2023-05-04 DIAGNOSIS — F1020 Alcohol dependence, uncomplicated: Secondary | ICD-10-CM | POA: Diagnosis not present

## 2023-05-04 NOTE — Progress Notes (Signed)
Daily Group Progress Note   Program: CD IOP     Group Time: 9 a.m. to 12 p.m.    Type of Therapy: Process and Psychoeducational    Topic: The therapists check in with group members, assess for SI/HI/psychosis and overall level of functioning. The therapists inquire about sobriety date and number of community support meetings attended since last session.     Therapists discuss how guilt and shame and of fear of being judged by others is an impediment to recovery.  Therapists focus on why persons in recovery are asked to identify themselves as an addict/alcoholic at twelve step meetings, as well to pick a start over chip and tell one's sponsor when they relapse. This helps the addicted person to lowering the judgements/shame they feel toward themselves, when they get used to saying this at each meeting.  Therapists discuss how recovery from addiction involves not only staying away from substances but also entails changing unhealthy relationship patterns via limit setting, assertiveness, etc. It is noted that people who do not use substance but remain in toxic relationships are "dry drunks"  Lastly therapist discuss there is a social component in recovery which involves finding activities that allow for socialization that do that involved substance use. Therapist emphasize that not building sober social interactions leads to loneliness and isolation  which leads to relapse.   Summary: Joyceline rates her depression as a "8" and her anxiety as a "8".  Guadalupe identifies her emotions as "disappointed, grateful, and hopeful".  Dystany says she has a new sober date which went from 04-28-23 to 05-01-23.  Shermeka shares that she is disappointed because her cousin never called back about Lanell's daughter visiting and now school has restarted for the new Year. Sema says she feels grateful that she was sober over the weekend.  Therapist asks what led up to her drinking. Shalee  says she was thinking about it  after Friday's group and on the way home and was not wanting to feel the feelings she had so she drank. She said she spoke to her sponsor on the zoom meeting yesterday afternoon prior to drinking. Keanu says there are so many unresolved issues.  Female Peers offered to give her their numbers.  Giada shares that she has been seeing a man that drinks. She says she has cut back on the times she sees him, but admits she is lonely.  Kaijah says she realizes this relationship is not healthy.  Therapist suggest she speak to her sponsor to let her know what is going on. Zarria shares the she started drinking at such a young age that she did not learn how to have fun away from alcohol.   Collected:   Yes   Results: No   AA/NA attended?: Yes   Sponsor?: Yes      [name], MS, LMFT, [address] [name], [facility], LCAS 05/04/2023

## 2023-05-06 ENCOUNTER — Encounter (HOSPITAL_COMMUNITY): Payer: Self-pay | Admitting: Medical

## 2023-05-06 ENCOUNTER — Ambulatory Visit (INDEPENDENT_AMBULATORY_CARE_PROVIDER_SITE_OTHER): Payer: MEDICAID

## 2023-05-06 DIAGNOSIS — F4310 Post-traumatic stress disorder, unspecified: Secondary | ICD-10-CM

## 2023-05-06 DIAGNOSIS — F3132 Bipolar disorder, current episode depressed, moderate: Secondary | ICD-10-CM

## 2023-05-06 DIAGNOSIS — F1020 Alcohol dependence, uncomplicated: Secondary | ICD-10-CM

## 2023-05-06 NOTE — Progress Notes (Signed)
Daily Group Progress Note   Program: [facility] IOP     Group Time: 9 a.m. to 12 p.m.    Type of Therapy: Process and Psychoeducational    Topic: The therapists check in with group members, assess for SI/HI/psychosis and overall level of functioning. The therapists inquire about sobriety date and number of community support meetings attended since last session.    The therapists introduce a new group member and provide information on tobacco cessation and Quitline [place] and how quitting smoking not only is beneficial to one's overall health but increases a person's probability of long-term sobriety from alcohol by twenty percent. The therapists touch briefly on the [facility] and how this system can be used to anticipate trouble spots in relationships with significant others and people in general. The therapists continue to stress the importance of family therapy in addiction recovery with it noted that one does not likely reach adulthood until he or she first is able to say "no" to one's parents. The therapists express how imperative it is for persons in recovery to not only avoid people in active addiction but to also remove themselves from toxic relationships and to seek support from those in recovery who are encouraging them. Lastly, the therapists explain that one cannot talk about living "life on life's terms" or do a 4th Step without being able to tell one's Sponsor about what is going on in his or her life.    Summary: Lanah rates her depression as a "8" and her anxiety as a "8". Edrie identifies her emotions as "very sad and let down".  Desaree shares her new sobriety date is today.  Makeya elaborates on how her sister continued to engage in  Editor, commissioning assasination. Julianny shares how they used to be close, but her cousin said that maybe they need to go back to court regarding Francetta's daughter. Shalyn says she is also in physical pain with her gout. Shelsey says she had an emotional relapse which  led to her alcohol relapse. Therapist discusses how to respond to her cousin by offering to make amends and yet setting healthy boundaries. Temica discusses the relationship she is in and says she realizes she cannot move forward with her sobriety if he is active addiction. Therapist advises Lakeena to get to meetings as she can find people who will provider her support. Nacole says she has not told her sponsor about her life issues as she does not want to scare her away. Therapist discusses how it is important to discuss what is going on in one's life with their sponsor as this is directly correlated with their sobriety.   Collected:   No  Results: None   AA/NA attended?: Yes   Sponsor?: Yes      [name], MS, LMFT, [address] [name], [facility], LCAS 05/06/2023

## 2023-05-07 ENCOUNTER — Ambulatory Visit: Payer: MEDICAID | Admitting: Podiatry

## 2023-05-08 ENCOUNTER — Ambulatory Visit (INDEPENDENT_AMBULATORY_CARE_PROVIDER_SITE_OTHER): Payer: MEDICAID

## 2023-05-08 DIAGNOSIS — F4310 Post-traumatic stress disorder, unspecified: Secondary | ICD-10-CM

## 2023-05-08 DIAGNOSIS — F3132 Bipolar disorder, current episode depressed, moderate: Secondary | ICD-10-CM

## 2023-05-08 DIAGNOSIS — F1020 Alcohol dependence, uncomplicated: Secondary | ICD-10-CM

## 2023-05-08 NOTE — Progress Notes (Signed)
Daily Group Progress Note   Program: CD IOP     Group Time: 9 a.m. to 12 p.m.    Type of Therapy: Process and Psychoeducational    Topic: The therapists check in with group members, assess for SI/HI/psychosis and overall level of functioning. The therapists inquire about sobriety date and number of community support meetings attended since last session.    The therapists facilitate discussion on the following topics:  the biological of addiction, specifically how one may be addicted to a specific substance and not another; opioids for post surgical pain versus being a treatment for chronic pain; addiction masking itself as other things, such as health problems and criminality; trauma and how the brain interprets  pieces together pieces of events in an altered state of consciousness; reservations relative to using.   Summary: Kristina Huffman rates her depression as a "7" and her anxiety as a "6". She identifies her emotions as "hopeful and grateful".  She reports the same sobriety date. Kristina Huffman says she spoke to her sponsor but did not tell her what is going on with her.  She says she does want her sponsor to be support for her. Therapist encouraged her to do so. Kristina Huffman says she feels hopeful about her sobriety.  Kristina Huffman says she realizes she is not responsible for making the guy she has been seeing happy.  She notes that they are not on the same page and she does not find much in common with him being sobriety.   Collected:   No  Results: Negative   AA/NA attended?: Yes   Sponsor?: Yes     [name], MS, LMFT, [address] [name], [facility], LCAS 05/08/2023

## 2023-05-11 ENCOUNTER — Ambulatory Visit (INDEPENDENT_AMBULATORY_CARE_PROVIDER_SITE_OTHER): Payer: MEDICAID

## 2023-05-11 ENCOUNTER — Ambulatory Visit (INDEPENDENT_AMBULATORY_CARE_PROVIDER_SITE_OTHER): Payer: MEDICAID | Admitting: Podiatry

## 2023-05-11 DIAGNOSIS — F4310 Post-traumatic stress disorder, unspecified: Secondary | ICD-10-CM | POA: Diagnosis not present

## 2023-05-11 DIAGNOSIS — F3132 Bipolar disorder, current episode depressed, moderate: Secondary | ICD-10-CM

## 2023-05-11 DIAGNOSIS — M7751 Other enthesopathy of right foot: Secondary | ICD-10-CM | POA: Diagnosis not present

## 2023-05-11 DIAGNOSIS — M109 Gout, unspecified: Secondary | ICD-10-CM

## 2023-05-11 DIAGNOSIS — F1020 Alcohol dependence, uncomplicated: Secondary | ICD-10-CM | POA: Diagnosis not present

## 2023-05-11 DIAGNOSIS — M779 Enthesopathy, unspecified: Secondary | ICD-10-CM | POA: Diagnosis not present

## 2023-05-11 NOTE — Progress Notes (Signed)
Daily Group Progress Note   Program: CD IOP     Group Time: 9 a.m. to 12 p.m.    Type of Therapy: Process and Psychoeducational    Topic: The therapists check in with group members, assess for SI/HI/psychosis and overall level of functioning. The therapists inquire about sobriety date and number of community support meetings attended since last session.    The therapists facilitate discussion on the following topics:  addictions as a biological disease, noting a key factor is unpredictability, differences in the use of someone who is addicted to alcohol vs. those who drink socially, including how someone can be "functional" (hold a job) and yet their lives are focused on alcohol and the attainment of it, importance of changing people, places and things, how recovery/sobriety needs to be number one in person with addictions life, how to respond to triggers, we are not always aware of this effect of use has on other's.   Summary: Kristina Huffman rates her depression as a "7" and her anxiety as a "8".  She identifies her emotions as "disgusted, but hopeful". Kristina Huffman says she had a new sobriety date and that was on last Saturday (05-05-23). Kristina Huffman says she drank on Friday evening (05-04-23). She says even though she has told herself she is not responsible for her boyfriend, she has feeling that she is. Therapist says she she continues to have contact with this man, it will not be a good outcome. Kristina Huffman says she does not want to be with him but recognizes he was a source of companionship.  Therapist asks what else she could do on Friday evenings and recommended going to a meeting on Friday evenings. She says she doesn't have a way and therapist tells her what to do about that. Kristina Huffman says she is disgusted with herself for drinking. Therapist says rather than beating up on her self to figure out what needs to be done about Friday evenings being a trigger.  Collected:   Yes  Results: None  AA/NA attended?:  Yes   Sponsor?: Yes   [name], MS, LMFT, [address] [name], [facility], LCAS 05/11/2023

## 2023-05-11 NOTE — Patient Instructions (Signed)
Gout  Gout is painful swelling of your joints. Gout is a type of arthritis. It is caused by having too much uric acid in your body. Uric acid is a chemical that is made when your body breaks down substances called purines. If your body has too much uric acid, sharp crystals can form and build up in your joints. This causes pain and swelling. Gout attacks can happen quickly and be very painful (acute gout). Over time, the attacks can affect more joints and happen more often (chronic gout). What are the causes? Gout is caused by too much uric acid in your blood. This can happen because: Your kidneys do not remove enough uric acid from your blood. Your body makes too much uric acid. You eat too many foods that are high in purines. These foods include organ meats, some seafood, and beer. Trauma or stress can bring on an attack. What increases the risk? Having a family history of gout. Being female and middle-aged. Being female and having gone through menopause. Having an organ transplant. Taking certain medicines. Having certain conditions, such as: Being very overweight (obese). Lead poisoning. Kidney disease. A skin condition called psoriasis. Other risks include: Losing weight too quickly. Not having enough water in the body (being dehydrated). Drinking alcohol, especially beer. Drinking beverages that are sweetened with a type of sugar called fructose. What are the signs or symptoms? An attack of acute gout often starts at night and usually happens in just one joint. The most common place is the big toe. Other joints that may be affected include joints of the feet, ankle, knee, fingers, wrist, or elbow. Symptoms may include: Very bad pain. Warmth. Swelling. Stiffness. Tenderness. The affected joint may be very painful to touch. Shiny, red, or purple skin. Chills and fever. Chronic gout may cause symptoms more often. More joints may be involved. You may also have white or yellow lumps  (tophi) on your hands or feet or in other areas near your joints. How is this treated? Treatment for an acute attack may include medicines for pain and swelling, such as: NSAIDs, such as ibuprofen. Steroids taken by mouth or injected into a joint. Colchicine. This can be given by mouth or through an IV tube. Treatment to prevent future attacks may include: Taking small doses of NSAIDs or colchicine daily. Using a medicine that reduces uric acid levels in your blood, such as allopurinol. Making changes to your diet. You may need to see a food expert (dietitian) about what to eat and drink to prevent gout. Follow these instructions at home: During a gout attack  If told, put ice on the painful area. To do this: Put ice in a plastic bag. Place a towel between your skin and the bag. Leave the ice on for 20 minutes, 2-3 times a day. Take off the ice if your skin turns bright red. This is very important. If you cannot feel pain, heat, or cold, you have a greater risk of damage to the area. Raise the painful joint above the level of your heart as often as you can. Rest the joint as much as possible. If the joint is in your leg, you may be given crutches. Follow instructions from your doctor about what you cannot eat or drink. Avoiding future gout attacks Eat a low-purine diet. Avoid foods and drinks such as: Liver. Kidney. Anchovies. Asparagus. Herring. Mushrooms. Mussels. Beer. Stay at a healthy weight. If you want to lose weight, talk with your doctor. Do not   lose weight too fast. Start or continue an exercise plan as told by your doctor. Eating and drinking Avoid drinks sweetened by fructose. Drink enough fluids to keep your pee (urine) pale yellow. If you drink alcohol: Limit how much you have to: 0-1 drink a day for women who are not pregnant. 0-2 drinks a day for men. Know how much alcohol is in a drink. In the [place] one drink equals one 12 oz bottle of beer (355 mL), one 5 oz  glass of wine (148 mL), or one 1 oz glass of hard liquor (44 mL). General instructions Take over-the-counter and prescription medicines only as told by your doctor. Ask your doctor if you should avoid driving or using machines while you are taking your medicine. Return to your normal activities when your doctor says that it is safe. Keep all follow-up visits. Where to find more information National Institutes of Health: www.niams.nih.gov Contact a doctor if: You have another gout attack. You still have symptoms of a gout attack after 10 days of treatment. You have problems (side effects) because of your medicines. You have chills or a fever. You have burning pain when you pee (urinate). You have pain in your lower back or belly. Get help right away if: You have very bad pain. Your pain cannot be controlled. You cannot pee. Summary Gout is painful swelling of the joints. The most common site of pain is the big toe, but it can affect other joints. Medicines and avoiding some foods can help to prevent and treat gout attacks. This information is not intended to replace advice given to you by your health care provider. Make sure you discuss any questions you have with your health care provider. Document Revised: 06/12/2021 Document Reviewed: 06/12/2021 Elsevier Patient Education  2024 Elsevier Inc.  

## 2023-05-13 ENCOUNTER — Encounter (HOSPITAL_COMMUNITY): Payer: Self-pay | Admitting: Licensed Clinical Social Worker

## 2023-05-13 ENCOUNTER — Ambulatory Visit (INDEPENDENT_AMBULATORY_CARE_PROVIDER_SITE_OTHER): Payer: MEDICAID | Admitting: Licensed Clinical Social Worker

## 2023-05-13 DIAGNOSIS — T7401XA Adult neglect or abandonment, confirmed, initial encounter: Secondary | ICD-10-CM

## 2023-05-13 DIAGNOSIS — F1020 Alcohol dependence, uncomplicated: Secondary | ICD-10-CM | POA: Diagnosis not present

## 2023-05-13 DIAGNOSIS — T7491XS Unspecified adult maltreatment, confirmed, sequela: Secondary | ICD-10-CM

## 2023-05-13 DIAGNOSIS — F4310 Post-traumatic stress disorder, unspecified: Secondary | ICD-10-CM

## 2023-05-13 DIAGNOSIS — F3132 Bipolar disorder, current episode depressed, moderate: Secondary | ICD-10-CM

## 2023-05-13 NOTE — Progress Notes (Signed)
Daily Group Progress Note   Program: CD IOP     Group Time: 9 a.m. to 12 p.m.    Type of Therapy: Process and Psychoeducational    Topic: The therapists check in with group members, assess for SI/HI/psychosis and overall level of functioning. The therapists inquire about sobriety date and number of community support meetings attended since last session.    Therapists show the video entitled, "Creating a Sober Support System" and elicit reactions from group members to the video. The therapists present information on the [facility] and how it can help to identify people's basic fears and basic desires in addition to predicting trouble spots with other Enneagram types. Therapists discuss the impact of addiction on people's families noting that people in active addiction may not be aware of the full extent of this impact. It is noted that people tend to associate the impact as chaotic behavior when in fact the impact may be the person's lack of presence and withdrawal from the family when using.    Summary: Ardene rates her depression as a "5" and her anxiety as a "7".  Shanteria identifies her feelings as "hopeful and determined".   She reports the same sobriety date and reports she has attended meetings since last group. Haileigh says she wants to explore meetings that are faith based.  A peer shares that [facility] has this focus.   Dawna says she plans to attend an online meeting in [place] today.  She shares she has not been in touch with her boyfriend and has in fact blocked his phone number. Therapist asks if she has a plan for Friday since it is a trigger day for her.  Latronda says she is working on a Copywriter, advertising. Nimah says she has not been sleeping well for the past four days but spoke to the P.A. about it today.  Collected: No  Results: None  AA/NA attended?: Yes   Sponsor?: Yes   [name], MS, LMFT, [address] [name], [facility], LCAS 05/13/2023
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
## 2023-05-13 NOTE — Progress Notes (Signed)
Subjective:   Patient ID: Kristina Huffman, female   DOB: 62 y.o.   MRN: [id number]   HPI Patient presents stating that she has not been seen for a number of years and she continues to get gout flareups mostly in the right big toe but does get it other spot in the and has tried allopurinol colchicine was not able to tolerate the allopurinol    ROS      Objective:  Physical Exam  Neurovascular status is found to be intact muscle strength adequate there is some redness around the inner phalangeal joint of the right big toe localized I did not note any acute attack but she states she is not having an acute attack currently.  Does have quite a bit of pain     Assessment:  Appears to be more of a inflammatory condition possible arthritis hard to rule out whether gout is a huge factor as she is not having acute attack today     Plan:  H&P got x-rays of right and reviewed discussed gout gave her foods to avoid and she is going to have uric acid tested when she goes to the doctor and may be placed on Uloric.  I did discuss other treatments I can do if she has an attack and next time she has an attack and want to see her.  X-rays indicate there is some reactivity around the right hallux but more consistent with osteoarthritis versus any kind of invasive condition

## 2023-05-15 ENCOUNTER — Ambulatory Visit (INDEPENDENT_AMBULATORY_CARE_PROVIDER_SITE_OTHER): Payer: MEDICAID

## 2023-05-15 DIAGNOSIS — F4310 Post-traumatic stress disorder, unspecified: Secondary | ICD-10-CM

## 2023-05-15 DIAGNOSIS — F1020 Alcohol dependence, uncomplicated: Secondary | ICD-10-CM | POA: Diagnosis not present

## 2023-05-15 DIAGNOSIS — F3132 Bipolar disorder, current episode depressed, moderate: Secondary | ICD-10-CM

## 2023-05-15 NOTE — Progress Notes (Signed)
Daily Group Progress Note   Program: CD IOP     Group Time: 9 a.m. to 12 p.m.    Type of Therapy: Process and Psychoeducational    Topic: The therapists check in with group members, assess for SI/HI/psychosis and overall level of functioning. The therapists inquire about sobriety date and number of community support meetings attended since last session.    herapist explains and facilitates discussion on the following topics: biology of addiction, including the role of dopamine and emphasizing it is a medical disease, rather than one of character strength, the role of family dynamics in addiction, the role of self care, specifically focusing on good nutrition as group members discussed their nutrition suffering while they were in active addiction, the role of thoughts, emotions and behaviors in the relapse cycle.  Summary: Bret rates her depression as a "5" and her anxiety as a "7".  Chloe identifies her emotions as sad and hopeful".  She says she is feeling sad about her cousin not allowing her daughter to come down this summer. She says her sober date is the same, being 7 days today. She has attended meetings. Naya shares that she has had a battle going on since she was a teenager that being with drinking alcohol and her pattern of eating. Jazlin says that she would drink which would suppress her appetite as she was getting sugar from the alcohol. She says she did not gain weight by doing that, however now that she is sober she is overeating and will eat until she passes out. Lyana says when she wakes up she feels hungry and thinks of drinking. She reports this has been a pattern previously in the cycle of active addition.  Orpah says she has had no contact with her boyfriend. She did share that her brother who suffers from addiction and is in active use wants to visit her and she says she is not "up for this again".  He has teased her in the past about her weight and continues to do so.   Willadeen shares that her son died when he was 54 from a Fentanyl overdose. And this is why it hurts so much when her cousin keeps her daughter away from her. Shaneen says she is hopeful that if she continues to deal with her addiction and engage in helpful activities she knows everything will work out. Jessey asserts herself today by sharing with a peer that she got very triggers with the detailing of drinking patterns.   Collected:   Yes  Results: Negative  AA/NA attended?: Yes   Sponsor?: Yes   [name], MS, LMFT, LCAS  05/15/2023

## 2023-05-18 ENCOUNTER — Ambulatory Visit (HOSPITAL_COMMUNITY): Payer: MEDICAID

## 2023-05-18 ENCOUNTER — Encounter (HOSPITAL_COMMUNITY): Payer: Self-pay

## 2023-05-20 ENCOUNTER — Ambulatory Visit (INDEPENDENT_AMBULATORY_CARE_PROVIDER_SITE_OTHER): Payer: MEDICAID

## 2023-05-20 DIAGNOSIS — F1020 Alcohol dependence, uncomplicated: Secondary | ICD-10-CM

## 2023-05-20 DIAGNOSIS — F3132 Bipolar disorder, current episode depressed, moderate: Secondary | ICD-10-CM

## 2023-05-20 DIAGNOSIS — F4310 Post-traumatic stress disorder, unspecified: Secondary | ICD-10-CM

## 2023-05-20 NOTE — Progress Notes (Signed)
Daily Group Progress Note   Program: CD IOP     Group Time: 9 a.m. to 12 p.m.    Type of Therapy: Process and Psychoeducational    Topic: The therapists check in with group members, assess for SI/HI/psychosis and overall level of functioning. The therapists inquire about sobriety date and number of community support meetings attended since last session.    Therapists discuss the following topics relative to the stages of recovery: role of persistence with the enemies of persistence being complacency and despair being what needs to be guarded against, the trance of illness as presented by Erle Crocker, facilitate discussion on this concept and asked for examples of how this played out in group members lives, presents video entitled "Honestly is Crucial for Recovery" which details relapse as an opportunity for learning versus being a failure, introduce principles of Cognitive Behavioral Therapy in response to negative self talk, discuss how War Stories should not be focused on, rather it is importance to move ahead with recovery.   Summary: Marai rates her depression as a "6" and her anxiety as a "6".  Odetta identifies her emotions as "disappointed and focused".  Amberlea says she had a slip on Monday night and yesterday.  Livian says she was thinking about her son who she lost to addiction when he was 3 and did not like how she was feeling.  Amaris says she is not sure how to manage the memories while being sober. Therapist discusses what her son may have wanted that being moving on with their life, rather than Felisha using and not dealing with her grief.  She says she has never cried about losing him while sober. She says she is working with her sponsor on [employer]. Her sponsor asked her to read 164 pages in [place].  Denni shares a reservation she has in sharing her grief in meetings that that is that she will over share and others won't like her.  Collected:   Yes  Results:  None  AA/NA attended?: Yes   Sponsor?: Yes   [name], MS, LMFT, [address] [name], [facility], LCAS 05/20/2023

## 2023-05-22 ENCOUNTER — Ambulatory Visit (INDEPENDENT_AMBULATORY_CARE_PROVIDER_SITE_OTHER): Payer: MEDICAID

## 2023-05-22 DIAGNOSIS — F1020 Alcohol dependence, uncomplicated: Secondary | ICD-10-CM | POA: Diagnosis not present

## 2023-05-22 DIAGNOSIS — F3132 Bipolar disorder, current episode depressed, moderate: Secondary | ICD-10-CM

## 2023-05-22 DIAGNOSIS — F4310 Post-traumatic stress disorder, unspecified: Secondary | ICD-10-CM
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
## 2023-05-27 ENCOUNTER — Ambulatory Visit (INDEPENDENT_AMBULATORY_CARE_PROVIDER_SITE_OTHER): Payer: MEDICAID

## 2023-05-27 DIAGNOSIS — F3132 Bipolar disorder, current episode depressed, moderate: Secondary | ICD-10-CM | POA: Diagnosis not present

## 2023-05-27 DIAGNOSIS — F4310 Post-traumatic stress disorder, unspecified: Secondary | ICD-10-CM | POA: Diagnosis not present

## 2023-05-27 DIAGNOSIS — F1020 Alcohol dependence, uncomplicated: Secondary | ICD-10-CM
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
## 2023-05-29 ENCOUNTER — Ambulatory Visit (INDEPENDENT_AMBULATORY_CARE_PROVIDER_SITE_OTHER): Payer: MEDICAID

## 2023-05-29 DIAGNOSIS — F1020 Alcohol dependence, uncomplicated: Secondary | ICD-10-CM | POA: Diagnosis not present

## 2023-05-29 DIAGNOSIS — F4310 Post-traumatic stress disorder, unspecified: Secondary | ICD-10-CM

## 2023-05-29 DIAGNOSIS — F3132 Bipolar disorder, current episode depressed, moderate: Secondary | ICD-10-CM
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
## 2023-06-01 ENCOUNTER — Ambulatory Visit (INDEPENDENT_AMBULATORY_CARE_PROVIDER_SITE_OTHER): Payer: MEDICAID

## 2023-06-01 DIAGNOSIS — F4310 Post-traumatic stress disorder, unspecified: Secondary | ICD-10-CM

## 2023-06-01 DIAGNOSIS — F1020 Alcohol dependence, uncomplicated: Secondary | ICD-10-CM | POA: Diagnosis not present

## 2023-06-01 DIAGNOSIS — F3132 Bipolar disorder, current episode depressed, moderate: Secondary | ICD-10-CM | POA: Diagnosis not present

## 2023-06-01 NOTE — Progress Notes (Signed)
Daily Group Progress Note   Program: CD IOP     Group Time: 9 a.m. to 12 p.m.    Type of Therapy: Process and Psychoeducational    Topic: The therapists check in with group members, assess for SI/HI/psychosis and overall level of functioning. The therapists inquire about sobriety date and number of community support meetings attended since last session.    Therapists pass out the [place] reading on "Rebellion".  Therapists facilitate discussion on Rebellion as it relates to addiction and early recovery through elaborating on the definition (stuck in self will), providing and eliciting examples of rebellion in this context. Therapists discuss how people who suffer from addiction may seek information from a therapist, sponsor or others with years in recovery on how to get better,however may refuse to follow through.Therapists discuss the importance of addicts to learn to let go and become humble when the information being shared from someone who has experienced success because the person in early recovery has not experienced that level of success. Therapists discuss when rebellion can be an asset against the disease of addiction.  Therapist present examples of this including "don't listen to your disease" because if you do what your disease is telling you, then you will not experience any degree of recovery. Therapists discuss the necessity of being "selfish" in recovery, that is making recovery one's number one priority.   Summary: Kristina Huffman rates her depression as a "8" and her anxiety as a "6". Kristina Huffman says she is not sure how she feels but knows she feels "down".  She says she has the same sobriety date.  She has attended meetings and has a sponsor.  Kristina Huffman says she is really enjoying the on [employer] in [place].   In response to the [place] reading on "Rebellion:", Kristina Huffman says she is trying to do things differently this time around in recovery. She says she had convinced herself she did not like  meetings and feels this is rebellion but noted after she found meetings in her home country she felt more comfortable.  Kristina Huffman says she is also attending meetings in person, here so she can have a sponsor.  Kristina Huffman shares she had an emotional weekend.  She says her husband is working in [place], [place] and her daughter is staying with her cousin in [place].  Kristina Huffman says her husband put pressure on her about wanting to meet their daughter who is now 89 yrs old and they have no seen since she was 62 years old. She says she started crying on the phone  with him and he agreed to wait until the daughter responds she is ready for this.   Kristina Huffman say she is trying to drill in her head that she can sit with the feelings she is having about her daughter living with her cousin, rather than drink the feelings away.  She did speak with her sponsor who told her to focus minute by minute on her recovery. Kristina Huffman says she has always taken care of others first but she knows from what the therapists discussed about being selfish about one's recovery is something she needs to do.  Collected: Yes Results: None  AA/NA attended?: Yes   Sponsor?: Yes   [name], MS, LMFT, LCAS Alene Mires, [place], [facility], LCAS  06/01/2023

## 2023-06-03 ENCOUNTER — Ambulatory Visit (HOSPITAL_COMMUNITY): Payer: MEDICAID | Admitting: Medical

## 2023-06-03 ENCOUNTER — Encounter (HOSPITAL_COMMUNITY): Payer: Self-pay

## 2023-06-03 DIAGNOSIS — F1020 Alcohol dependence, uncomplicated: Secondary | ICD-10-CM | POA: Diagnosis not present

## 2023-06-03 DIAGNOSIS — T7401XA Adult neglect or abandonment, confirmed, initial encounter: Secondary | ICD-10-CM

## 2023-06-03 DIAGNOSIS — F4310 Post-traumatic stress disorder, unspecified: Secondary | ICD-10-CM

## 2023-06-03 DIAGNOSIS — F3132 Bipolar disorder, current episode depressed, moderate: Secondary | ICD-10-CM

## 2023-06-03 DIAGNOSIS — T7491XS Unspecified adult maltreatment, confirmed, sequela: Secondary | ICD-10-CM

## 2023-06-03 DIAGNOSIS — F319 Bipolar disorder, unspecified: Secondary | ICD-10-CM

## 2023-06-03 DIAGNOSIS — M1A9XX0 Chronic gout, unspecified, without tophus (tophi): Secondary | ICD-10-CM

## 2023-06-03 DIAGNOSIS — F4381 Prolonged grief disorder: Secondary | ICD-10-CM

## 2023-06-03 NOTE — Progress Notes (Signed)
Daily Group Progress Note   Program: CD IOP     Group Time: 9 a.m. to 12 p.m.    Type of Therapy: Process and Psychoeducational    Topic: The therapists check in with group members, assess for SI/HI/psychosis and overall level of functioning. The therapists inquire about sobriety date and number of community support meetings attended since last session.   Therapists discuss and prompt discussions on the following topics today: the issue of patients getting mis-diagnosed when a full history is not obtained, for instance mental health diagnoses being assigned when a full addiction history does not delineate whether or not the mental health symptoms occurred when an individual is in sobriety or if they are being given such diagnoses when they are in active addiction; Discussed the importance of understanding the family member's perception of their loved one's disease of addiction and issues that have arisen as a result of being in active action.  Reframed loved one's "nagging" the person in active addiction to a caring stance. Discussed the importance of looking at what needs to happen once the person is in recovery. Discussed "rolling with addiction" as a helpful concept to emulate when one is dealing with family or friends that continue to bring up behaviors that were done when one is in active addiction; Discussed readiness to change, including the reasons to stop using, both negative and positive.   Summary: Kristina Huffman rates her depression as a "8" and her anxiety as a "5".  Kristina Huffman identifies her emotions as "grateful and hopeful".  Kristina Huffman says one has to dig deep to discover the reasons regarding why and if one wants to quit using.  Kristina Huffman shares that she is trying to rebuild the relationship with her cousin that has her daughter.   Kristina Huffman said the rebuilding process was going well until her cousin found messages on her phone that Kristina Huffman sent while he was drinking and her cousin brought this up  yet again.  Kristina Huffman said this was painful for her. Therapist discusses "rolling with resistance".  Kristina Huffman says she is taking responsible for her drinking and thinks she better understand how her cousin feels. Kristina Huffman says she validated her cousin's feeling and did not last out at her.  Therapist points out what progress this was.   Collected: No  Results: Negative  AA/NA attended?: Yes   Sponsor?: Yes   Kristina Eisenmenger, MS, Kristina Huffman, Kristina Huffman Kristina Mires, LCSW, [facility], Kristina Huffman  06/03/2023                        Individual CD-IOP Progress Note  Time: 12pm to 1pm  Topics: Therapist encourages Kristina Huffman to continue what she is doing as she is progressing. Therapist emphasizes that the longer she engages with positive supports, she will build friendships that are supportive.  Therapist discusses setting boundaries with those in her life that are not able to set appropriate boundaries. Therapist discusses that at the appropriate time, Kristina Huffman may want to engage in group grief counseling with [facility],    Summary: Kristina Huffman reports that her moods are up and down. She discusses how her family was quite close until her grandma passed and it is no longer the case that they gather as they used to.  She says she misses it. Kristina Huffman discusses family roles specifically when she was in active addiction.  She says she still has some irrational guilt that comes in waves when she wonders what she might could have done to prevent her  son from overdosing on Fentanyl.  Kristina Huffman says she realizes it is irrational guilt.  Kristina Huffman says she is reaching out more and trying to get out of house, rather than isolating. She noted that she checked in on a woman she has known that has ill health to see how she was doing. Kristina Huffman expressed gratitude for the group and her recovery process, thus far.   Kristina Poke., LCSW, LCMHC, Kristina Huffman Kristina Huffman, M.S., Kristina Huffman, Kristina Huffman
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
## 2023-06-05 ENCOUNTER — Ambulatory Visit (INDEPENDENT_AMBULATORY_CARE_PROVIDER_SITE_OTHER): Payer: MEDICAID

## 2023-06-05 DIAGNOSIS — F1020 Alcohol dependence, uncomplicated: Secondary | ICD-10-CM | POA: Diagnosis not present

## 2023-06-05 DIAGNOSIS — F3132 Bipolar disorder, current episode depressed, moderate: Secondary | ICD-10-CM

## 2023-06-05 DIAGNOSIS — F4310 Post-traumatic stress disorder, unspecified: Secondary | ICD-10-CM

## 2023-06-05 NOTE — Progress Notes (Signed)
Daily Group Progress Note   Program: [facility] IOP     Group Time: 9 a.m. to 12 p.m.    Type of Therapy: Process and Psychoeducational    Topic: The therapists check in with group members, assess for SI/HI/psychosis and overall level of functioning. The therapists inquire about sobriety date and number of community support meetings attended since last session.    Therapist welcome new group member. Therapists discuss and prompts discussion about the following cognitive distortions and their role in impeding progress in treatment: All or nothing, over generalizations, mental filters, discounting, jumping to conclusions, magnification, emotional reasoning, should statements, labeling and personalization and blame.   Therapists discuss the role of boredom in Post Acute Withdrawal Syndrome  and how to deal with it. Therapists also discuss the importance of keeping the momentum and how to to about this.  Summary: Dejae rates her depression as a "8" and her anxiety as a "6".  Avery says she needs to talk to the  group and therapist. Katrin  says she had a slip last Saturday, on 05-30-23 and she wants to apologize for not telling the truth about this.  Nayla says when she drank, it really bothered her. She says "with all I have learned and I still drank".  Therapist discusses how she does not need to apologize about having a slip and the importance of "doing an autopsy", that is exploring what led to the slip and what can be learned in order to move ahead is what is importance.  Chyna shares that she had thoughts going through her mind of people that blamed her for her son's death. Therapists explain she could not have stopped her son from having the disease of addiction, so she could not stop him from using or make him use.  Jorge says she needs to talk to someone about her grief about her son's death, as she has not been sober previously to honestly deal with it.  Gao will meet with [name] on  Monday at noon for an individual session.  Collected: no Results: None  AA/NA attended?: Yes   Sponsor?: Yes   [name], MS, LMFT, LCAS Alene Mires, [place], [facility], LCAS  06/05/2023

## 2023-06-08 ENCOUNTER — Telehealth (HOSPITAL_COMMUNITY): Payer: Self-pay | Admitting: Licensed Clinical Social Worker

## 2023-06-08 ENCOUNTER — Encounter (HOSPITAL_COMMUNITY): Payer: Self-pay

## 2023-06-08 ENCOUNTER — Ambulatory Visit (HOSPITAL_COMMUNITY): Payer: MEDICAID

## 2023-06-08 NOTE — Telephone Encounter (Signed)
The therapist receives a call from Tiney who will not be in group today as her leg has gotten so bad that she cannot walk on it so is going to the Urgent Care.  Myrna Blazer, MA, LCSW, [facility], LCAS 06/08/2023

## 2023-06-09 ENCOUNTER — Ambulatory Visit (HOSPITAL_COMMUNITY)
Admission: EM | Admit: 2023-06-09 | Discharge: 2023-06-09 | Disposition: A | Discharge status: Home / Self Care | Payer: MEDICAID | Attending: Emergency Medicine | Admitting: Emergency Medicine

## 2023-06-09 ENCOUNTER — Encounter (HOSPITAL_COMMUNITY): Payer: Self-pay

## 2023-06-09 DIAGNOSIS — M109 Gout, unspecified: Secondary | ICD-10-CM

## 2023-06-09 HISTORY — DX: Gout, unspecified: M10.9

## 2023-06-09 MED ORDER — DEXAMETHASONE SODIUM PHOSPHATE 10 MG/ML IJ SOLN
10.0000 mg | Freq: Once | INTRAMUSCULAR | Status: AC
  Administered 2023-06-09: 10 mg via INTRAMUSCULAR

## 2023-06-09 MED ORDER — KETOROLAC TROMETHAMINE 60 MG/2ML IM SOLN
30.0000 mg | Freq: Once | INTRAMUSCULAR | Status: AC
  Administered 2023-06-09: 30 mg via INTRAMUSCULAR

## 2023-06-09 MED ORDER — DEXAMETHASONE SODIUM PHOSPHATE 10 MG/ML IJ SOLN
INTRAMUSCULAR | Status: AC
  Filled 2023-06-09: qty 1

## 2023-06-09 MED ORDER — KETOROLAC TROMETHAMINE 30 MG/ML IJ SOLN
INTRAMUSCULAR | Status: AC
  Filled 2023-06-09: qty 1

## 2023-06-09 NOTE — Discharge Instructions (Signed)
You can continue taking colchicine as needed. Stop taking allopurinol. You should not take this during an acute flare of gout. You can follow-up with primary care doctor and return here as needed. [facility] also has a specific orthopedic urgent care that you can go to for an acute flare.

## 2023-06-09 NOTE — ED Notes (Addendum)
Patient came out of the room yelling that she was in extreme pain and no one ws helping her. Patient had requested Cortisone injectins into her right knee and left elbow. Patient had been informed that we did not provide this service. Patient became extremely upset. Patient erpeatedly kept saying that no one ws helping her.   Hulan Saas, CMA told the provider that the patient was upset about no one coming in.

## 2023-06-09 NOTE — ED Provider Notes (Signed)
[facility]    CSN: [id number] Arrival date & time: 06/09/23  1624      History   Chief Complaint Chief Complaint  Patient presents with   Knee Pain   Elbow Pain    HPI Kristina Huffman is a 62 y.o. female.   Patient presents with swelling and pain to right knee x 5 days and swelling and pain to left elbow x 2 to 3 days. History of gout. Patient states she has been taking allopurinol, colchicine, and ibuprofen without relief.   Knee Pain Associated symptoms: no back pain, no fatigue and no fever     Past Medical History:  Diagnosis Date   Aneurysm of splenic artery (HCC)    Anxiety    Asthma    Bipolar disorder (HCC)    Depression    Elevated LFTs    ETOH abuse    GERD (gastroesophageal reflux disease)    Gout    Hepatitis C    History of MRSA infection    legs and spread to face   Hypertension    Insomnia    Mixed hyperlipidemia    OSA (obstructive sleep apnea)    Sleep apnea     Patient Active Problem List   Diagnosis Date Noted   Alcohol addiction (HCC) 04/07/2022   Obstructive sleep apnea syndrome 05/08/2020   Acute respiratory failure due to COVID-19 (HCC) 10/14/2019   ARF (acute renal failure) (HCC) 10/14/2019   Essential hypertension 10/14/2019   Controlled type 2 diabetes mellitus with hyperglycemia (HCC) 10/14/2019   Alcohol use disorder, severe, dependence (HCC) 06/06/2018   Deviated nasal septum 12/03/2017   Mixed hyperlipidemia 11/29/2017   Gastroesophageal reflux disease 10/06/2017   Chronic hepatitis C without hepatic coma (HCC) 06/04/2016   Benzodiazepine dependence (HCC) 09/21/2011   Bipolar 1 disorder, mixed, moderate (HCC) 09/21/2011   PTSD (post-traumatic stress disorder) 09/21/2011    Past Surgical History:  Procedure Laterality Date   BACK SURGERY     RADIAL HEAD ARTHROPLASTY  08/09/2012   Procedure: RADIAL HEAD ARTHROPLASTY;  Surgeon: Marlowe Shores, MD;  Location: [facility];  Service: Orthopedics;  Laterality:  Left;  Left Radial head Replacement    OB History     Gravida  8   Para  4   Term  4   Preterm      AB  4   Living  4      SAB  1   IAB  3   Ectopic      Multiple      Live Births  4            Home Medications    Prior to Admission medications   Medication Sig Start Date End Date Taking? Authorizing Provider  ACCU-CHEK GUIDE test strip test ONCE EVERY DAY 11/27/22   [provider]  Accu-Chek Softclix Lancets lancets daily. 11/14/22   [provider]  albuterol (VENTOLIN HFA) 108 (90 Base) MCG/ACT inhaler Inhale 2 puffs into the lungs every 6 (six) hours as needed for wheezing or shortness of breath. 11/13/22   Viviano Simas, FNP  atorvastatin (LIPITOR) 40 MG tablet Take 1 tablet (40 mg total) by mouth at bedtime. 04/09/22 05/09/22  Park Pope, MD  baclofen (LIORESAL) 10 MG tablet Take 1 tablet (10 mg total) by mouth 3 (three) times daily. 04/29/23 04/28/24  Court Joy, [name]  Blood Glucose Monitoring Suppl (ACCU-CHEK GUIDE ME) w/Device KIT daily. as directed 10/23/22   [provider]  Cholecalciferol (VITAMIN D3) 50 MCG (2000 UT) TABS Take 2,000 mcg by mouth daily. 04/09/22   Park Pope, MD  colchicine 0.6 MG tablet Take 0.6 mg by mouth daily as needed (For gout).    [provider]  DENTA 5000 PLUS 1.1 % CREA dental cream Take by mouth 2 (two) times daily. 08/19/22   [provider]  Docusate Sodium (DSS) 100 MG CAPS Take 1 capsule every day by oral route at bedtime for 30 days. 05/01/21   [provider]  doxycycline (VIBRAMYCIN) 100 MG capsule Take 1 capsule (100 mg total) by mouth 2 (two) times daily. 04/04/23   Raspet, Noberto Retort, [name]  escitalopram (LEXAPRO) 10 MG tablet Take 1 tablet orally at bedtime 11/03/22   [provider]  gabapentin (NEURONTIN) 300 MG capsule Take 2 capsules (600 mg total) by mouth at bedtime. 04/10/22 12/25/22  Park Pope, MD  hydrochlorothiazide (HYDRODIURIL) 25 MG tablet Take 1 tablet  (25 mg total) by mouth daily. 04/09/22 12/25/22  Park Pope, MD  ondansetron (ZOFRAN) 4 MG tablet Take 4 mg by mouth every 6 (six) hours as needed for nausea or vomiting.    [provider]  pantoprazole (PROTONIX) 40 MG tablet Take 1 tablet (40 mg total) by mouth daily. 11/17/22   Meryl Dare, MD  predniSONE (DELTASONE) 20 MG tablet Take 2 tablets (40 mg total) by mouth daily with breakfast. 04/02/23   Margaretann Loveless, [name]  QUEtiapine (SEROQUEL) 100 MG tablet Take 1 tablet (100 mg total) by mouth daily. Take 1 tablet at 5 PM by mouth daily 04/09/22 12/25/22  Park Pope, MD  QUEtiapine (SEROQUEL) 200 MG tablet Take 1 tablet (200 mg total) by mouth at bedtime. Patient taking differently: Take 300 mg by mouth at bedtime. 04/09/22   Park Pope, MD  traZODone (DESYREL) 100 MG tablet Take 1 tablet (100 mg total) by mouth at bedtime. 04/09/22 12/25/22  Park Pope, MD  valACYclovir (VALTREX) 1000 MG tablet Take 1 tablet every 12 hours by oral route for 2 days. 06/10/22   [provider]  metFORMIN (GLUCOPHAGE) 500 MG tablet Take 1 tablet (500 mg total) by mouth 2 (two) times daily with a meal. For diabetes management 06/09/18 11/30/19  Sanjuana Kava, NP    Family History Family History  Problem Relation Age of Onset   Depression Mother    Osteoporosis Mother    COPD Mother    Rheum arthritis Mother    Diabetes Father    Hypertension Father    Congestive Heart Failure Father    Aneurysm Father    Clotting disorder Father    Heart disease Father    Cancer Maternal Grandfather    Colon cancer Neg Hx    Esophageal cancer Neg Hx    Rectal cancer Neg Hx    Stomach cancer Neg Hx     Social History Social History   Tobacco Use   Smoking status: Never   Smokeless tobacco: Never  Vaping Use   Vaping status: Never Used  Substance Use Topics   Alcohol use: Not Currently   Drug use: Not Currently    Types: Heroin    Comment: no drug use for 3 years     Allergies   Sulfa  antibiotics and Aspirin   Review of Systems Review of Systems  Constitutional:  Positive for activity change. Negative for fatigue and fever.  Musculoskeletal:  Positive for gait problem and joint swelling. Negative for back pain.  Neurological:  Negative for dizziness, weakness and light-headedness.     Physical Exam Triage Vital Signs ED Triage Vitals  Encounter Vitals Group     BP 06/09/23 1741 132/86     Systolic BP Percentile --      Diastolic BP Percentile --      Pulse Rate 06/09/23 1741 100     Resp 06/09/23 1741 16     Temp 06/09/23 1741 98.3 F (36.8 C)     Temp Source 06/09/23 1741 Oral     SpO2 06/09/23 1741 93 %     Weight --      Height --      Head Circumference --      Peak Flow --      Pain Score 06/09/23 1743 10     Pain Loc --      Pain Education --      Exclude from Growth Chart --    No data found.  Updated Vital Signs BP 132/86 (BP Location: Right Arm)   Pulse 100   Temp 98.3 F (36.8 C) (Oral)   Resp 16   LMP 08/02/2013 Comment: irregular  SpO2 93%   Visual Acuity Right Eye Distance:   Left Eye Distance:   Bilateral Distance:    Right Eye Near:   Left Eye Near:    Bilateral Near:     Physical Exam Vitals and nursing note reviewed.  Constitutional:      General: She is awake. She is not in acute distress.    Appearance: Normal appearance. She is well-developed and well-groomed. She is not ill-appearing, toxic-appearing or diaphoretic.  Musculoskeletal:        General: Swelling and tenderness present. No deformity or signs of injury.     Right elbow: Normal.     Left elbow: Swelling present. No deformity or effusion. Decreased range of motion. Tenderness present.     Right knee: Swelling present. Decreased range of motion. Tenderness present.     Left knee: Normal.     Right lower leg: No edema.     Left lower leg: No edema.     Comments: R knee warm to touch.  Skin:    General: Skin is warm and dry.  Neurological:     Mental  Status: She is alert.  Psychiatric:        Behavior: Behavior is cooperative.      UC Treatments / Results  Labs (all labs ordered are listed, but only abnormal results are displayed) Labs Reviewed - No data to display  EKG   Radiology No results found.  Procedures Procedures (including critical care time)  Medications Ordered in UC Medications  ketorolac (TORADOL) injection 30 mg (30 mg Intramuscular Given 06/09/23 1819)  dexamethasone (DECADRON) injection 10 mg (10 mg Intramuscular Given 06/09/23 1818)    Initial Impression / Assessment and Plan / UC Course  I have reviewed the triage vital signs and the nursing notes.  Pertinent labs & imaging results that were available during my care of the patient were reviewed by me and considered in my medical decision making (see chart for details).     Patient presented with swelling and pain to right knee x 5 days and swelling and pain to left elbow x 2 to 3 days. History of gout. Denies any injury. Patient states she has been taking allopurinol, colchicine, and ibuprofen without relief. Upon assessment patient has mild swelling just to the right knee which is warm to touch. Patient also has  mild swelling to the left elbow and is warm to touch. Given Decadron and Toradol injection in clinic.  Recommended continuing to take colchicine as needed. Informed patient to stop taking allopurinol and that she should not take this during and if acute flare of gout.  Discussed follow-up and return precautions. Final Clinical Impressions(s) / UC Diagnoses   Final diagnoses:  Acute gout of multiple sites, unspecified cause     Discharge Instructions      You can continue taking colchicine as needed. Stop taking allopurinol. You should not take this during an acute flare of gout. You can follow-up with primary care doctor and return here as needed. [facility] also has a specific orthopedic urgent care that you can go to for an acute flare.      ED Prescriptions   None    PDMP not reviewed this encounter.   Wynonia Lawman A, NP 06/09/23 [phone]

## 2023-06-09 NOTE — ED Triage Notes (Signed)
Patient reports that she has right knee pain  and swelling x 5 days and left elbow x 2-3 days.  Patient states she has been taking allopurinol.

## 2023-06-10 ENCOUNTER — Encounter (HOSPITAL_COMMUNITY): Payer: Self-pay

## 2023-06-10 ENCOUNTER — Ambulatory Visit (HOSPITAL_COMMUNITY): Payer: MEDICAID

## 2023-06-10 ENCOUNTER — Encounter (HOSPITAL_COMMUNITY): Payer: Self-pay | Admitting: Medical

## 2023-06-10 ENCOUNTER — Telehealth (HOSPITAL_COMMUNITY): Payer: Self-pay

## 2023-06-10 NOTE — Telephone Encounter (Signed)
Kristina Huffman calls this therapist and therapist confirms identity by obtaining two verifiers.  Zephyr Programmer, applications for missing group this a.m. She explained she went to urgent care on yesterday due to swelling and severe pain in her leg/foot.  She says she has had gout before and has trouble ambulating when she is in such pain. [name] says she plans to come to [facility] on Friday and asks this therapist if we can reschedule the individual appointment. Therapist asked her if Monday at noon will work and she says that will be fine.  [name], MS., LMFT, LCAS 06-10-23

## 2023-06-11 ENCOUNTER — Telehealth (HOSPITAL_COMMUNITY): Payer: Self-pay

## 2023-06-11 NOTE — Telephone Encounter (Signed)
Maryjean Morn [name] sent a message to this therapist asking that therapist contact Kristina Huffman and ask her to bring her medications to group on Friday so he could review them.  This therapist called and reached VM.  Therapist left a HIPAA compliant VM requesting a return call.  [name], MS, LMFT, LCAS 06-11-23

## 2023-06-11 NOTE — Telephone Encounter (Signed)
Therapist calls [facility] and confirms her identify via two identifiers.  Therapist explained that Kristina Morn, [name] had asked this therapist to contact her and ask her to bring in all her medications so he could review them with her.  Kristina Huffman agrees to do this and says she will see this therapist tomorrow.  [name], MS., LMFT, LCAS 06-11-2023

## 2023-06-12 ENCOUNTER — Encounter (HOSPITAL_COMMUNITY): Payer: Self-pay

## 2023-06-12 ENCOUNTER — Ambulatory Visit (INDEPENDENT_AMBULATORY_CARE_PROVIDER_SITE_OTHER): Payer: MEDICAID | Admitting: Medical

## 2023-06-12 DIAGNOSIS — F4310 Post-traumatic stress disorder, unspecified: Secondary | ICD-10-CM

## 2023-06-12 DIAGNOSIS — F4381 Prolonged grief disorder: Secondary | ICD-10-CM

## 2023-06-12 DIAGNOSIS — F3132 Bipolar disorder, current episode depressed, moderate: Secondary | ICD-10-CM

## 2023-06-12 DIAGNOSIS — T7491XS Unspecified adult maltreatment, confirmed, sequela: Secondary | ICD-10-CM

## 2023-06-12 DIAGNOSIS — M1A9XX0 Chronic gout, unspecified, without tophus (tophi): Secondary | ICD-10-CM

## 2023-06-12 DIAGNOSIS — T7401XA Adult neglect or abandonment, confirmed, initial encounter: Secondary | ICD-10-CM

## 2023-06-12 DIAGNOSIS — F1020 Alcohol dependence, uncomplicated: Secondary | ICD-10-CM

## 2023-06-12 NOTE — Progress Notes (Signed)
Daily Group Progress Note   Program: [facility]     Group Time: 9 a.m. to 12 p.m.    Type of Therapy: Process and Psychoeducational    Topic: The therapists check in with group members, assess for SI/HI/psychosis and overall level of functioning. The therapists inquire about sobriety date and number of community support meetings attended since last session.   Therapists discuss the following issues and prompt discussion:  how persons suffering from substance use disorders often suffer from depressive, anxiety, including trauma disorders, administered the PHQ-9 and GAD-7 and explaining severity levels and at what level it is indicated that medication management may be helpful, and how stability these disorders can contribute to remaining sober, explain ACE'S (Adverse Childhood Experience) and the high percentage of children having these experiences before age 27, discuss the long term effects on persons who have had these experiences and the potentiality of them also suffering from substance use disorders, discuss shame being a threat to sobriety and how this is addressed in step 4 which has one making a search of their lives and step 5 which involved telling this to another person, emphasized how sharing such information is beneficial in recovery, as it allows one "to put the bag of bricks down".    Summary: Kristina Huffman rates her depression as a "8" and her anxiety as a "8".  She identified her emotion as "drained".  Kristina Huffman says she has a new sobriety date (06-11-23).  She says she has had a rough week.  Kristina Huffman elaborates that she has been in severe pain with her gout flare up.  So that pain was the trigger for her use. Kristina Huffman says others have told her there is something wrong with her and she agrees.  She said she needs to deal with her grief from her son dying. Therapist discusses setting boundaries with others, rather than continuing to believe what they say is the truth. Kristina Huffman scored a 7 on the PHQ-9  and a 14 on the GAD-7.   Collected: no  Results: None  AA/NA attended?: Yes   Sponsor?: Yes   [name], MS, LMFT, LCAS Alene Mires, [place], [facility], LCAS  06/12/2023

## 2023-06-12 NOTE — Progress Notes (Signed)
Patient ID: Kristina Huffman, female   DOB: 05/05/1961, 62 y.o.   MRN: [id number] S- Pt is seen for inconsistencies on UDS and Current Medication list She is currently struggling with consciousness of terrible trauma in her life which are overpowering triggers.She admits she is not taking meds as prescribed. She feels like the trauma is coming out in her body. Psychologically "I'm all over the place"  In addition she has had another Gout attack most like related to her alcohol use which she feels guilt /shame about.She says there are times when sh is taking the drinkan thinking "I don't want this". Additionally her brother has told her she needs to eat the way they were brought up in [place] to and she has found she does feel better when she does this  O-UDS Gabepentin NOT DETECTED     MED- Gabapentin 600 mg HS [name] takes PRN for anxiety/panic        UDS Prozac and metabolite- PRESENT       MED- No current RX Pt states when she came to [facility] she was switched from Prozac to Lexapro         UDS-Lexapro (Escitalopram)NOT DETECTED        MED- RX Lexapro(Escitalopram) - 10 mg HS [name] reports she did not find Lexapro effective an the absence of Prozac (40 mg daily) intolerable (She had been on Prozac 20,40 an 80mg  daily since 2015)  A-She is used to doing things her way without consulting-ie taking medications/eating Gout producing foods/Drinking instaed of calling Her Depression self report scores in Group are not improving t  P- Will try to take meds a prescribed      Will get Genesight       ? Add Lamictal-dicuss whe [name] is back

## 2023-06-15 ENCOUNTER — Telehealth (HOSPITAL_COMMUNITY): Payer: Self-pay | Admitting: Licensed Clinical Social Worker

## 2023-06-15 ENCOUNTER — Ambulatory Visit (INDEPENDENT_AMBULATORY_CARE_PROVIDER_SITE_OTHER): Payer: MEDICAID

## 2023-06-15 DIAGNOSIS — F3132 Bipolar disorder, current episode depressed, moderate: Secondary | ICD-10-CM

## 2023-06-15 DIAGNOSIS — F4310 Post-traumatic stress disorder, unspecified: Secondary | ICD-10-CM | POA: Diagnosis not present

## 2023-06-15 DIAGNOSIS — F1020 Alcohol dependence, uncomplicated: Secondary | ICD-10-CM | POA: Diagnosis not present

## 2023-06-15 NOTE — Telephone Encounter (Signed)
Jeyda leaves a Psychologist, occupational for being "rude" today in group saying that she was not feeling well.   Myrna Blazer, MA, LCSW, [facility], LCAS 06/14/2024

## 2023-06-15 NOTE — Progress Notes (Signed)
Daily Group Progress Note   Program: [facility] IOP     Group Time: 9 a.m. to 12 p.m.    Type of Therapy: Process and Psychoeducational    Topic: The therapists check in with group members, assess for SI/HI/psychosis and overall level of functioning. The therapists inquire about sobriety date and number of community support meetings attended since last session.   Therapists pass out the [place] reading, "Keeping the Gift". Therapists discuss viewing recovery as a gift and how gifts are precious. Therapists discuss how people who value themselves want to do things that are healthy and how if a person hates themselves, they then participate in activities and practices that are self destructive. Therapists discuss epigenetics, focusing on the impact of intergenerational trauma and addiction.  Therapists discuss the importance of after care with integrated treatment. Therapists also discuss the importance of having a sponsor that has availability that persons in early recovery need.   Summary: Kristina Huffman rates her depression as a "6" and her anxiety as a "7".  She shares that she has a new sobriety date, that being yesterday (06-14-23).  Therapists inquire about possible triggers and Kristina Huffman mood becomes irritable. She indicated when the therapist asks about her slip that it makes her feel bad about herself. Kristina Huffman says she had ordered the alcohol and had it delivered. Therapist asks if there is alcohol in the house and she says, "yes". Kristina Huffman states she will get rid of it.  Kristina Huffman says Kristina Huffman, PA_-C discussed sober living options, however she indicates that is something she is not interested in. Therapist discusses how it is important to analyze her drinking patterns and figure out what pieces need to be put in place to obtain sobriety and maintaining it.   Collected: Yes Results: None  AA/NA attended?: No   Sponsor?: Yes   [name], MS, LMFT, LCAS Alene Mires, [place], [facility],  LCAS  06/15/2023

## 2023-06-17 ENCOUNTER — Ambulatory Visit (INDEPENDENT_AMBULATORY_CARE_PROVIDER_SITE_OTHER): Payer: MEDICAID

## 2023-06-17 DIAGNOSIS — F4310 Post-traumatic stress disorder, unspecified: Secondary | ICD-10-CM

## 2023-06-17 DIAGNOSIS — F3132 Bipolar disorder, current episode depressed, moderate: Secondary | ICD-10-CM | POA: Diagnosis not present

## 2023-06-17 DIAGNOSIS — F1020 Alcohol dependence, uncomplicated: Secondary | ICD-10-CM
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
## 2023-06-19 ENCOUNTER — Telehealth (HOSPITAL_COMMUNITY): Payer: Self-pay

## 2023-06-19 ENCOUNTER — Ambulatory Visit (HOSPITAL_COMMUNITY): Payer: MEDICAID

## 2023-06-19 NOTE — Telephone Encounter (Signed)
Kristina Huffman calls and says she just received a call from medicaid transportation and they told her that they are only transporting for dialysis and cancer treatments today, so she does not have a way to group.  [name] says she will be here on Monday, 06-22-23.  [name], MS., LMFT, LCAS 06-19-23

## 2023-06-22 ENCOUNTER — Encounter (HOSPITAL_COMMUNITY): Payer: Self-pay

## 2023-06-22 ENCOUNTER — Ambulatory Visit (HOSPITAL_COMMUNITY): Payer: MEDICAID

## 2023-06-22 ENCOUNTER — Telehealth (HOSPITAL_COMMUNITY): Payer: Self-pay | Admitting: Licensed Clinical Social Worker

## 2023-06-22 NOTE — Telephone Encounter (Signed)
The therapist receives a voicemail from [place] indicating that she is on her way to detox at the [facility].  Myrna Blazer, MA, LCSW, [facility], LCAS 06/22/2023

## 2023-06-23 ENCOUNTER — Telehealth (HOSPITAL_COMMUNITY): Payer: Self-pay | Admitting: Licensed Clinical Social Worker

## 2023-06-23 NOTE — Telephone Encounter (Signed)
The therapist returns Kristina Huffman's calls confirming her identity via two identifiers. She says that she needs to go to detox as when she tries to quit drinking she will get sick. She asks if she should if this therapist needs to call or if she should just go. The therapist informs her that she can go to the [facility] 24/7 and that they will have access to her IOP records, etcetera. As she does not drive, there is no risk of her drinking and driving as she has to arrange transport.   Myrna Blazer, MA, LCSW, [facility], LCAS 06/23/2023

## 2023-06-24 ENCOUNTER — Telehealth (HOSPITAL_COMMUNITY): Payer: Self-pay | Admitting: Licensed Clinical Social Worker

## 2023-06-24 ENCOUNTER — Ambulatory Visit (HOSPITAL_COMMUNITY)
Admission: EM | Admit: 2023-06-24 | Discharge: 2023-06-24 | Disposition: A | Discharge status: Other Acute Inpatient Hospital | Payer: MEDICAID | Attending: Psychiatry | Admitting: Psychiatry

## 2023-06-24 ENCOUNTER — Other Ambulatory Visit: Payer: Self-pay

## 2023-06-24 ENCOUNTER — Encounter (HOSPITAL_COMMUNITY): Payer: Self-pay

## 2023-06-24 ENCOUNTER — Ambulatory Visit (HOSPITAL_COMMUNITY): Payer: MEDICAID

## 2023-06-24 ENCOUNTER — Other Ambulatory Visit (HOSPITAL_COMMUNITY)
Admission: EM | Admit: 2023-06-24 | Discharge: 2023-06-29 | Disposition: A | Discharge status: Home / Self Care | Payer: MEDICAID | Attending: Psychiatry | Admitting: Psychiatry

## 2023-06-24 DIAGNOSIS — F10229 Alcohol dependence with intoxication, unspecified: Secondary | ICD-10-CM | POA: Insufficient documentation

## 2023-06-24 DIAGNOSIS — F1010 Alcohol abuse, uncomplicated: Secondary | ICD-10-CM | POA: Diagnosis present

## 2023-06-24 DIAGNOSIS — F319 Bipolar disorder, unspecified: Secondary | ICD-10-CM | POA: Insufficient documentation

## 2023-06-24 DIAGNOSIS — F419 Anxiety disorder, unspecified: Secondary | ICD-10-CM | POA: Insufficient documentation

## 2023-06-24 DIAGNOSIS — K219 Gastro-esophageal reflux disease without esophagitis: Secondary | ICD-10-CM | POA: Insufficient documentation

## 2023-06-24 DIAGNOSIS — F10239 Alcohol dependence with withdrawal, unspecified: Secondary | ICD-10-CM | POA: Insufficient documentation

## 2023-06-24 DIAGNOSIS — I10 Essential (primary) hypertension: Secondary | ICD-10-CM | POA: Insufficient documentation

## 2023-06-24 DIAGNOSIS — E785 Hyperlipidemia, unspecified: Secondary | ICD-10-CM | POA: Insufficient documentation

## 2023-06-24 DIAGNOSIS — R4589 Other symptoms and signs involving emotional state: Secondary | ICD-10-CM

## 2023-06-24 DIAGNOSIS — F1020 Alcohol dependence, uncomplicated: Secondary | ICD-10-CM

## 2023-06-24 LAB — LIPID PANEL
Cholesterol: 237 mg/dL — ABNORMAL HIGH (ref 0–200)
HDL: 52 mg/dL (ref >40)
LDL Cholesterol: 119 mg/dL — ABNORMAL HIGH (ref 0–99)
Total CHOL/HDL Ratio: 4.6 {ratio}
Triglycerides: 328 mg/dL — ABNORMAL HIGH (ref <150)
VLDL: 66 mg/dL — ABNORMAL HIGH (ref 0–40)

## 2023-06-24 LAB — CBC WITH DIFFERENTIAL/PLATELET
Abs Immature Granulocytes: 0.01 10*3/uL (ref 0.00–0.07)
Basophils Absolute: 0 10*3/uL (ref 0.0–0.1)
Basophils Relative: 1 %
Eosinophils Absolute: 0.1 10*3/uL (ref 0.0–0.5)
Eosinophils Relative: 2 %
HCT: 42.4 % (ref 36.0–46.0)
Hemoglobin: 14.2 g/dL (ref 12.0–15.0)
Immature Granulocytes: 0 %
Lymphocytes Relative: 37 %
Lymphs Abs: 1.7 10*3/uL (ref 0.7–4.0)
MCH: 30.5 pg (ref 26.0–34.0)
MCHC: 33.5 g/dL (ref 30.0–36.0)
MCV: 91.2 fL (ref 80.0–100.0)
Monocytes Absolute: 0.3 10*3/uL (ref 0.1–1.0)
Monocytes Relative: 7 %
Neutro Abs: 2.6 10*3/uL (ref 1.7–7.7)
Neutrophils Relative %: 53 %
Platelets: 240 10*3/uL (ref 150–400)
RBC: 4.65 MIL/uL (ref 3.87–5.11)
RDW: 13.2 % (ref 11.5–15.5)
WBC: 4.8 10*3/uL (ref 4.0–10.5)
nRBC: 0 % (ref 0.0–0.2)

## 2023-06-24 LAB — COMPREHENSIVE METABOLIC PANEL
ALT: 53 U/L — ABNORMAL HIGH (ref 0–44)
AST: 52 U/L — ABNORMAL HIGH (ref 15–41)
Albumin: 3.9 g/dL (ref 3.5–5.0)
Alkaline Phosphatase: 79 U/L (ref 38–126)
Anion gap: 11 (ref 5–15)
BUN: 8 mg/dL (ref 6–20)
CO2: 21 mmol/L — ABNORMAL LOW (ref 22–32)
Calcium: 9 mg/dL (ref 8.9–10.3)
Chloride: 105 mmol/L (ref 98–111)
Creatinine, Ser: 0.76 mg/dL (ref 0.44–1.00)
GFR, Estimated: >60 mL/min (ref >60)
Glucose, Bld: 103 mg/dL — ABNORMAL HIGH (ref 70–99)
Potassium: 3.6 mmol/L (ref 3.5–5.1)
Sodium: 137 mmol/L (ref 135–145)
Total Bilirubin: 1.1 mg/dL (ref 0.3–1.2)
Total Protein: 7 g/dL (ref 6.5–8.1)

## 2023-06-24 LAB — TSH: TSH: 2.25 u[IU]/mL (ref 0.350–4.500)

## 2023-06-24 LAB — URINALYSIS, ROUTINE W REFLEX MICROSCOPIC
Bilirubin Urine: NEGATIVE
Glucose, UA: NEGATIVE mg/dL
Hgb urine dipstick: NEGATIVE
Ketones, ur: NEGATIVE mg/dL
Leukocytes,Ua: NEGATIVE
Nitrite: NEGATIVE
Protein, ur: NEGATIVE mg/dL
Specific Gravity, Urine: 1.002 — ABNORMAL LOW (ref 1.005–1.030)
pH: 6 (ref 5.0–8.0)

## 2023-06-24 LAB — ETHANOL: Alcohol, Ethyl (B): 180 mg/dL — ABNORMAL HIGH (ref <10)

## 2023-06-24 LAB — MAGNESIUM: Magnesium: 2 mg/dL (ref 1.7–2.4)

## 2023-06-24 LAB — HEMOGLOBIN A1C
Hgb A1c MFr Bld: 6.1 % — ABNORMAL HIGH (ref 4.8–5.6)
Mean Plasma Glucose: 128.37 mg/dL

## 2023-06-24 LAB — POCT URINE DRUG SCREEN - MANUAL ENTRY (I-SCREEN)
POC Amphetamine UR: NOT DETECTED
POC Buprenorphine (BUP): NOT DETECTED
POC Cocaine UR: NOT DETECTED
POC Marijuana UR: NOT DETECTED
POC Methadone UR: NOT DETECTED
POC Methamphetamine UR: NOT DETECTED
POC Morphine: NOT DETECTED
POC Oxazepam (BZO): NOT DETECTED
POC Oxycodone UR: NOT DETECTED
POC Secobarbital (BAR): NOT DETECTED

## 2023-06-24 MED ORDER — MAGNESIUM HYDROXIDE 400 MG/5ML PO SUSP: 30.0000 mL | Freq: Every day | ORAL | Status: DC | PRN

## 2023-06-24 MED ORDER — FLUOXETINE HCL 20 MG PO CAPS
40.0000 mg | ORAL_CAPSULE | Freq: Every day | ORAL | Status: DC
  Administered 2023-06-24 – 2023-06-27 (×4): 40 mg via ORAL
  Filled 2023-06-24 (×4): qty 2

## 2023-06-24 MED ORDER — LORAZEPAM 1 MG PO TABS
1.0000 mg | ORAL_TABLET | Freq: Every day | ORAL | Status: AC
  Administered 2023-06-28: 1 mg via ORAL
  Filled 2023-06-24: qty 1

## 2023-06-24 MED ORDER — THIAMINE MONONITRATE 100 MG PO TABS: 100.0000 mg | ORAL_TABLET | Freq: Every day | ORAL | Status: DC

## 2023-06-24 MED ORDER — LORAZEPAM 1 MG PO TABS: 1.0000 mg | ORAL_TABLET | ORAL | Status: DC | PRN

## 2023-06-24 MED ORDER — HYDROCHLOROTHIAZIDE 25 MG PO TABS
25.0000 mg | ORAL_TABLET | Freq: Every day | ORAL | Status: DC
  Administered 2023-06-25 – 2023-06-29 (×5): 25 mg via ORAL
  Filled 2023-06-24 (×5): qty 1

## 2023-06-24 MED ORDER — HYDROXYZINE HCL 25 MG PO TABS: 25.0000 mg | ORAL_TABLET | Freq: Four times a day (QID) | ORAL | Status: DC | PRN

## 2023-06-24 MED ORDER — COLCHICINE 0.6 MG PO TABS: 0.6000 mg | ORAL_TABLET | Freq: Every day | ORAL | Status: DC | PRN

## 2023-06-24 MED ORDER — VITAMIN D 25 MCG (1000 UNIT) PO TABS
2000.0000 ug | ORAL_TABLET | Freq: Every day | ORAL | Status: DC
  Filled 2023-06-24: qty 2
  Filled 2023-06-24: qty 1

## 2023-06-24 MED ORDER — ALBUTEROL SULFATE (2.5 MG/3ML) 0.083% IN NEBU: 2.5000 mg | INHALATION_SOLUTION | Freq: Four times a day (QID) | RESPIRATORY_TRACT | Status: DC | PRN

## 2023-06-24 MED ORDER — LOPERAMIDE HCL 2 MG PO CAPS: 2.0000 mg | ORAL_CAPSULE | ORAL | Status: AC | PRN

## 2023-06-24 MED ORDER — ONDANSETRON 4 MG PO TBDP: 4.0000 mg | ORAL_TABLET | Freq: Four times a day (QID) | ORAL | Status: AC | PRN

## 2023-06-24 MED ORDER — ONDANSETRON 4 MG PO TBDP: 4.0000 mg | ORAL_TABLET | Freq: Four times a day (QID) | ORAL | Status: DC | PRN

## 2023-06-24 MED ORDER — BACLOFEN 10 MG PO TABS
10.0000 mg | ORAL_TABLET | Freq: Three times a day (TID) | ORAL | Status: DC
  Filled 2023-06-24 (×6): qty 1

## 2023-06-24 MED ORDER — THIAMINE MONONITRATE 100 MG PO TABS
100.0000 mg | ORAL_TABLET | Freq: Every day | ORAL | Status: DC
  Administered 2023-06-25 – 2023-06-29 (×5): 100 mg via ORAL
  Filled 2023-06-24 (×5): qty 1

## 2023-06-24 MED ORDER — QUETIAPINE FUMARATE 100 MG PO TABS
100.0000 mg | ORAL_TABLET | Freq: Two times a day (BID) | ORAL | Status: DC
  Administered 2023-06-24 – 2023-06-27 (×6): 100 mg via ORAL
  Filled 2023-06-24 (×6): qty 1

## 2023-06-24 MED ORDER — THIAMINE HCL 100 MG/ML IJ SOLN: 100.0000 mg | Freq: Once | INTRAMUSCULAR | Status: DC

## 2023-06-24 MED ORDER — ACETAMINOPHEN 325 MG PO TABS: 650.0000 mg | ORAL_TABLET | Freq: Four times a day (QID) | ORAL | Status: DC | PRN

## 2023-06-24 MED ORDER — TRAZODONE HCL 50 MG PO TABS: 50.0000 mg | ORAL_TABLET | Freq: Every evening | ORAL | Status: DC | PRN

## 2023-06-24 MED ORDER — LORAZEPAM 1 MG PO TABS
1.0000 mg | ORAL_TABLET | Freq: Four times a day (QID) | ORAL | Status: AC
  Administered 2023-06-24 – 2023-06-25 (×4): 1 mg via ORAL
  Filled 2023-06-24 (×4): qty 1

## 2023-06-24 MED ORDER — LOPERAMIDE HCL 2 MG PO CAPS: 2.0000 mg | ORAL_CAPSULE | ORAL | Status: DC | PRN

## 2023-06-24 MED ORDER — LORAZEPAM 1 MG PO TABS
1.0000 mg | ORAL_TABLET | Freq: Three times a day (TID) | ORAL | Status: AC
  Administered 2023-06-25 – 2023-06-26 (×3): 1 mg via ORAL
  Filled 2023-06-24 (×3): qty 1

## 2023-06-24 MED ORDER — PANTOPRAZOLE SODIUM 40 MG PO TBEC
40.0000 mg | DELAYED_RELEASE_TABLET | Freq: Every day | ORAL | Status: DC
  Filled 2023-06-24 (×4): qty 1

## 2023-06-24 MED ORDER — LORAZEPAM 1 MG PO TABS: 1.0000 mg | ORAL_TABLET | Freq: Four times a day (QID) | ORAL | Status: AC | PRN

## 2023-06-24 MED ORDER — ZIPRASIDONE MESYLATE 20 MG IM SOLR: 20.0000 mg | INTRAMUSCULAR | Status: DC | PRN

## 2023-06-24 MED ORDER — ATORVASTATIN CALCIUM 40 MG PO TABS
40.0000 mg | ORAL_TABLET | Freq: Every day | ORAL | Status: DC
  Administered 2023-06-24 – 2023-06-28 (×5): 40 mg via ORAL
  Filled 2023-06-24 (×5): qty 1

## 2023-06-24 MED ORDER — ALBUTEROL SULFATE HFA 108 (90 BASE) MCG/ACT IN AERS: 1.0000 | INHALATION_SPRAY | Freq: Four times a day (QID) | RESPIRATORY_TRACT | Status: DC | PRN

## 2023-06-24 MED ORDER — GABAPENTIN 300 MG PO CAPS
600.0000 mg | ORAL_CAPSULE | Freq: Three times a day (TID) | ORAL | Status: DC
  Administered 2023-06-24: 600 mg via ORAL
  Administered 2023-06-25: 300 mg via ORAL
  Filled 2023-06-24 (×2): qty 2

## 2023-06-24 MED ORDER — VITAMIN D 25 MCG (1000 UNIT) PO TABS
2000.0000 [IU] | ORAL_TABLET | Freq: Every day | ORAL | Status: DC
  Administered 2023-06-24 – 2023-06-29 (×6): 2000 [IU] via ORAL
  Filled 2023-06-24 (×6): qty 2

## 2023-06-24 MED ORDER — LORAZEPAM 1 MG PO TABS
1.0000 mg | ORAL_TABLET | Freq: Two times a day (BID) | ORAL | Status: AC
  Administered 2023-06-26 – 2023-06-27 (×2): 1 mg via ORAL
  Filled 2023-06-24 (×2): qty 1

## 2023-06-24 MED ORDER — ADULT MULTIVITAMIN W/MINERALS CH
1.0000 | ORAL_TABLET | Freq: Every day | ORAL | Status: DC
  Administered 2023-06-24: 1 via ORAL
  Filled 2023-06-24: qty 1

## 2023-06-24 MED ORDER — ALUM & MAG HYDROXIDE-SIMETH 200-200-20 MG/5ML PO SUSP: 30.0000 mL | ORAL | Status: DC | PRN

## 2023-06-24 MED ORDER — ADULT MULTIVITAMIN W/MINERALS CH
1.0000 | ORAL_TABLET | Freq: Every day | ORAL | Status: DC
  Administered 2023-06-25 – 2023-06-29 (×5): 1 via ORAL
  Filled 2023-06-24 (×5): qty 1

## 2023-06-24 MED ORDER — LORAZEPAM 1 MG PO TABS: 1.0000 mg | ORAL_TABLET | Freq: Four times a day (QID) | ORAL | Status: DC | PRN

## 2023-06-24 MED ORDER — TRAZODONE HCL 100 MG PO TABS
100.0000 mg | ORAL_TABLET | Freq: Every day | ORAL | Status: DC
  Administered 2023-06-24 – 2023-06-28 (×5): 100 mg via ORAL
  Filled 2023-06-24 (×5): qty 1

## 2023-06-24 MED ORDER — OLANZAPINE 10 MG PO TBDP: 10.0000 mg | ORAL_TABLET | Freq: Three times a day (TID) | ORAL | Status: DC | PRN

## 2023-06-24 MED ORDER — THIAMINE HCL 100 MG/ML IJ SOLN
100.0000 mg | Freq: Once | INTRAMUSCULAR | Status: AC
  Administered 2023-06-24: 100 mg via INTRAMUSCULAR
  Filled 2023-06-24: qty 2

## 2023-06-24 NOTE — Telephone Encounter (Signed)
The therapist receives a voicemail from [place] stating that she is downstairs at the [facility] in the process of getting admitted to detox.  Myrna Blazer, MA, LCSW, [facility], LCAS 06/24/2023

## 2023-06-24 NOTE — ED Notes (Signed)
Pt calm and cooperative no c/o pain or distress alert and orient x 4 denies SI/HI/AVH pt wanted to know how long before she will be transferred to [facility] writer explained to her as soon as I can give report to the next nurse and make sure her blood work is back

## 2023-06-24 NOTE — ED Notes (Signed)
Patient is alert and oriented. States she is here for alcohol detox. Patient is calm and cooperative.Patient denies SI or HI. Patient does endorse auditory hallucinations. Denies that they are command. Skin check conducted by this RN and MHT. Meal and drink provided. Patient in no acute distress. Will continue to monitor for safety.
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
## 2023-06-24 NOTE — Progress Notes (Signed)
   06/24/23 1345  [facility] Triage Screening (Walk-ins at [facility] only)  How Did You Hear About [place]? Self  What Is the Reason for Your Visit/Call Today? Kristina Huffman is a 62 year old single female who presents voluntarily to [facility] with complaints of a recent relapse and a traumatic event that occurred last saturday. Pt reports she is being seen upstairs and finds it to be useful. However, she mentions that she had relapsed on Sunday due to the traumatic event. Pt currently has depression and anxiety, but no suicidal thoughts. Pt also reports she has passive hallucinations, but denies having any currently. Patient reports she has been depressed since the passing of her 14 year old son in 2012. Patient is prescribed Seroquel and Trazadone by Dr. Maggie Schwalbe. Pt states she has been drinking a liter in a half daily since friday and reports a hx of alcohol use for the past year. Pt reports she drank 1 liter and a hald today. Pt is irritable but cooperative throughout triage. Patient denies drug use, SI, HI and AVH currently.  How Long Has This Been Causing You Problems? <Week  Have You Recently Had Any Thoughts About Hurting Yourself? No  Are You Planning to Commit Suicide/Harm Yourself At This time? No  Have you Recently Had Thoughts About Hurting Someone [name]? No  Are You Planning To Harm Someone At This Time? No  Are you currently experiencing any auditory, visual or other hallucinations? Yes  Please explain the hallucinations you are currently experiencing: passive hallucinations  Have You Used Any Alcohol or Drugs in the Past 24 Hours? Yes  How long ago did you use Drugs or Alcohol? today  What Did You Use and How Much? a liter in a half since friday  Do you have any current medical co-morbidities that require immediate attention? No  Clinician description of patient physical appearance/behavior: irritable, cooperative  What Do You Feel Would Help You the Most Today? Alcohol or Drug Use Treatment  If access to [facility]  Urgent Care was not available, would you have sought care in the Emergency Department? No  Determination of Need Urgent (48 hours)  Options For Referral Intensive Outpatient Therapy;Facility-Based Crisis

## 2023-06-24 NOTE — ED Provider Notes (Cosign Needed Addendum)
[facility]gent [facility] Continuous Assessment Admission H&P  Date: 06/24/23 Patient Name: Kristina Huffman MRN: [id number] Chief Complaint: "I went upstairs 2 days ago, told them that I relapsed...tried to get out of it on my own but am getting sick".   Diagnoses:  Final diagnoses:  Alcohol abuse    HPI: Kristina Huffman is a 62 year-old female who  presents to [facility] voluntarily complaining increased depression and anxiety along with alcohol abuse problem. She reports that she recently relapsed on alcohol, has been trying to detox herself "but getting very sick". Patient reports that she has been getting services at [facility] services  for medication management and therapy. Her last visit was 06/17/23.  She reports that medications and therapy were helping but she relapsed due to a traumatic event. Patient also states "I am from [place], you know drinking is in our culture....".  Patient reports that she basically drinks wine, up to 1 and 1/2 liters every day and "can't stop drinking". Last use was this morning.  Patient states that "each time I try to stop, I get very sick".   She reports feeling depressed anxious and sad. She reports that her goal is to get medically detox and continue the services at [facility]. Patient denies use of other substances. She  reports a family hx of alcohol abuse: her grandmother had alcohol problem. Her two brothers also have alcohol abuse problem.  Patient reports that her family and friends are supportive. She reports decreased appetite. Reports not sleeping well. She reports that her goal is to get clean again and continue services in outpatient setting.   Assessment: Patient is evaluated face-to-face by this NP and chart/nursing notes reviewed.  62 year old female sitting in the assessment room. She is guarded and irritable upon approach. She is casually dressed and groomed. She appears to be well nourished. Alert and oriented x 4. Her eye contact is fair. Her thought  process is clear, organized and goal-directed. She is anxious and reports feeling depressed. Rates her depression and anxiety at 9/10. She denies SI/HI/AVH. Does not appear to be preoccupied. Does not appear to be responding to internal stimuli. Patient is irritable and tearful, and reports feeling remorseful in regard to her alcohol problem. She reports that she had been trying to stay sober until recently when she experienced a traumatic event then relapsed on alcohol, which she is using on daily basis. Patient does not want to talk about the experienced traumatic event. She admits to drinking up to 1 1/2 liters of wine every day and last use was today. She reports that she has been trying to wean herself off alcohol but has not been successful "because each time I try, I get very sick". Patient denies use of other substances. She reports that drinking is habitual in her cultures and states that she is from [place] and drinking is acceptable in her culture.  She reports a family hx of alcohol abuse. She reports that she has a support system composed of family and friends.  Patient denies current medical issues. She reports feeling sick whenever she stops drinking. She currently denies pain. Denies headache/dizziness. Denies respiratory distress. Denies vision/hearing problems. Denies chest/back/abdominal pain. She reports feeling tired with malaise. She reports not sleeping well. Patient reports no concerns with her current medication regimen and therapy. She reports that the relapse was triggered by a traumatic event and she is willing to be treated and continue with outpatient services.   Patient expresses motivation for alcohol treatment.  She recognizes her triggers and  wants to  get clean and get back to her routine.  I consulted with Dr Lucianne Muss and it was determined that patient meets criteria for admission to [facility].  She will be admitted to observation unit and will be transferred to [facility] upon lab results.    Total Time spent with patient: 30 minutes  Musculoskeletal  Strength & Muscle Tone: within normal limits Gait & Station: normal Patient leans: N/A  Psychiatric Specialty Exam  Presentation General Appearance:  Casual  Eye Contact: Fair  Speech: Clear and Coherent  Speech Volume: Normal  Handedness: Right   Mood and Affect  Mood: Anxious; Hopeless; Irritable; Depressed  Affect: Tearful; Depressed   Thought Process  Thought Processes: Coherent  Descriptions of Associations:Intact  Orientation:Full (Time, Place and Person)  Thought Content:Logical  Diagnosis of Schizophrenia or Schizoaffective disorder in past: No  Duration of Psychotic Symptoms: No data recorded Hallucinations:Hallucinations: None  Ideas of Reference:None  Suicidal Thoughts:Suicidal Thoughts: No  Homicidal Thoughts:Homicidal Thoughts: No   Sensorium  Memory: Immediate Fair; Recent Fair; Remote Fair  Judgment: Fair  Insight: Fair   Art therapist  Concentration: Fair  Attention Span: Fair  Recall: [employer] of Knowledge: Fair  Language: Fair   Psychomotor Activity  Psychomotor Activity: Psychomotor Activity: Restlessness   Assets  Assets: Communication Skills; Desire for Improvement; Social Support   Sleep  Sleep: Sleep: Poor Number of Hours of Sleep: 3   Nutritional Assessment (For OBS and FBC admissions only) Has the patient had a weight loss or gain of 10 pounds or more in the last 3 months?: No Has the patient had a decrease in food intake/or appetite?: Yes Does the patient have dental problems?: No Does the patient have eating habits or behaviors that may be indicators of an eating disorder including binging or inducing vomiting?: No Has the patient recently lost weight without trying?: 0 Has the patient been eating poorly because of a decreased appetite?: 1 Malnutrition Screening Tool Score: 1    Physical Exam Vitals and nursing  note reviewed.  Constitutional:      Appearance: Normal appearance.  HENT:     Head: Normocephalic and atraumatic.     Right Ear: Tympanic membrane normal.     Left Ear: Tympanic membrane normal.     Mouth/Throat:     Mouth: Mucous membranes are moist.  Eyes:     Extraocular Movements: Extraocular movements intact.     Pupils: Pupils are equal, round, and reactive to light.  Cardiovascular:     Rate and Rhythm: Normal rate.     Pulses: Normal pulses.  Pulmonary:     Effort: Pulmonary effort is normal.  Musculoskeletal:        General: Normal range of motion.     Cervical back: Normal range of motion and neck supple.  Neurological:     General: No focal deficit present.     Mental Status: She is alert and oriented to person, place, and time.    Review of Systems  Constitutional: Negative.   HENT: Negative.    Eyes: Negative.   Respiratory: Negative.    Cardiovascular: Negative.   Gastrointestinal: Negative.   Genitourinary: Negative.   Musculoskeletal: Negative.   Skin: Negative.   Neurological: Negative.   Endo/Heme/Allergies: Negative.   Psychiatric/Behavioral:  Positive for depression and substance abuse. The patient is nervous/anxious and has insomnia.     Blood pressure 112/89, pulse 89, temperature 97.6 F (36.4 C), temperature source Oral,  resp. rate 19, last menstrual period 08/02/2013, SpO2 100%. There is no height or weight on file to calculate BMI.  Past Psychiatric History: Bipolar I disorder, PTSD, Alcohol addiction  Is the patient at risk to self? No  Has the patient been a risk to self in the past 6 months? No .    Has the patient been a risk to self within the distant past? No   Is the patient a risk to others? No   Has the patient been a risk to others in the past 6 months? No   Has the patient been a risk to others within the distant past? No   Past Medical History: Hep C,GERD, Hx of MRSA, DM2 Family History: Brothers have alcohol abuse problem.  Grandmother had Alcohol abuse problem  Social History: Family and friends are supportive  Last Labs:  Admission on 03/25/2023, Discharged on 03/26/2023  Component Date Value Ref Range Status   WBC 03/25/2023 6.0  4.0 - 10.5 K/uL Final   RBC 03/25/2023 4.95  3.87 - 5.11 MIL/uL Final   Hemoglobin 03/25/2023 15.6 (H)  12.0 - 15.0 g/dL Final   HCT 62/13/0865 46.1 (H)  36.0 - 46.0 % Final   MCV 03/25/2023 93.1  80.0 - 100.0 fL Final   MCH 03/25/2023 31.5  26.0 - 34.0 pg Final   MCHC 03/25/2023 33.8  30.0 - 36.0 g/dL Final   RDW 78/46/9629 12.7  11.5 - 15.5 % Final   Platelets 03/25/2023 239  150 - 400 K/uL Final   nRBC 03/25/2023 0.0  0.0 - 0.2 % Final   Neutrophils Relative % 03/25/2023 51  % Final   Neutro Abs 03/25/2023 3.1  1.7 - 7.7 K/uL Final   Lymphocytes Relative 03/25/2023 40  % Final   Lymphs Abs 03/25/2023 2.4  0.7 - 4.0 K/uL Final   Monocytes Relative 03/25/2023 6  % Final   Monocytes Absolute 03/25/2023 0.3  0.1 - 1.0 K/uL Final   Eosinophils Relative 03/25/2023 2  % Final   Eosinophils Absolute 03/25/2023 0.1  0.0 - 0.5 K/uL Final   Basophils Relative 03/25/2023 1  % Final   Basophils Absolute 03/25/2023 0.0  0.0 - 0.1 K/uL Final   Immature Granulocytes 03/25/2023 0  % Final   Abs Immature Granulocytes 03/25/2023 0.02  0.00 - 0.07 K/uL Final   Performed at [facility], [address] [place], [place] [address]   Sodium 03/25/2023 136  135 - 145 mmol/L Final   Potassium 03/25/2023 4.0  3.5 - 5.1 mmol/L Final   Chloride 03/25/2023 101  98 - 111 mmol/L Final   CO2 03/25/2023 22  22 - 32 mmol/L Final   Glucose, Bld 03/25/2023 112 (H)  70 - 99 mg/dL Final   Glucose reference range applies only to samples taken after fasting for at least 8 hours.   BUN 03/25/2023 11  6 - 20 mg/dL Final   Creatinine, Ser 03/25/2023 0.73  0.44 - 1.00 mg/dL Final   Calcium 32/44/0102 9.5  8.9 - 10.3 mg/dL Final   Total Protein 72/53/6644 7.2  6.5 - 8.1 g/dL Final   Albumin 03/47/4259  4.2  3.5 - 5.0 g/dL Final   AST 56/38/7564 51 (H)  15 - 41 U/L Final   ALT 03/25/2023 46 (H)  0 - 44 U/L Final   Alkaline Phosphatase 03/25/2023 86  38 - 126 U/L Final   Total Bilirubin 03/25/2023 1.1  0.3 - 1.2 mg/dL Final   GFR, Estimated  03/25/2023 >60  >60 mL/min Final   Comment: (NOTE) Calculated using the CKD-EPI Creatinine Equation (2021)    Anion gap 03/25/2023 13  5 - 15 Final   Performed at [facility], [address], [place], [place] [address]   Hgb A1c MFr Bld 03/25/2023 5.7 (H)  4.8 - 5.6 % Final   Comment: (NOTE) Pre diabetes:          5.7%-6.4%  Diabetes:              >6.4%  Glycemic control for   <7.0% adults with diabetes    Mean Plasma Glucose 03/25/2023 116.89  mg/dL Final   Performed at [facility], [address] [place], [place] [address]   Alcohol, Ethyl (B) 03/25/2023 211 (H)  <10 mg/dL Final   Comment: (NOTE) Lowest detectable limit for serum alcohol is 10 mg/dL.  For medical purposes only. Performed at [facility], [address] [place], [place] [address]    Cholesterol 03/25/2023 180  0 - 200 mg/dL Final   Triglycerides 08/65/7846 416 (H)  <150 mg/dL Final   HDL 96/29/5284 51  >40 mg/dL Final   Total CHOL/HDL Ratio 03/25/2023 3.5  RATIO Final   VLDL 03/25/2023 UNABLE TO CALCULATE IF TRIGLYCERIDE OVER 400 mg/dL  0 - 40 mg/dL Final   LDL Cholesterol 03/25/2023 UNABLE TO CALCULATE IF TRIGLYCERIDE OVER 400 mg/dL  0 - 99 mg/dL Final   Comment:        Total Cholesterol/HDL:CHD Risk Coronary Heart Disease Risk Table                     Men   Women  1/2 Average Risk   3.4   3.3  Average Risk       5.0   4.4  2 X Average Risk   9.6   7.1  3 X Average Risk  23.4   11.0        Use the calculated Patient Ratio above and the CHD Risk Table to determine the patient's CHD Risk.        ATP III CLASSIFICATION (LDL):  <100     mg/dL   Optimal  132-440  mg/dL   Near or Above                    Optimal  130-159  mg/dL   Borderline   102-725  mg/dL   High  >366     mg/dL   Very High Performed at [facility], [address], [place], [place] [address]    TSH 03/25/2023 3.277  0.350 - 4.500 uIU/mL Final   Comment: Performed by a 3rd Generation assay with a functional sensitivity of <=0.01 uIU/mL. Performed at [facility], [address], [place], [place] [address]    Preg Test, Ur 03/25/2023 Negative  Negative Final   POC Amphetamine UR 03/25/2023 None Detected  NONE DETECTED (Cut Off Level 1000 ng/mL) Final   POC Secobarbital (BAR) 03/25/2023 None Detected  NONE DETECTED (Cut Off Level 300 ng/mL) Final   POC Buprenorphine (BUP) 03/25/2023 None Detected  NONE DETECTED (Cut Off Level 10 ng/mL) Final   POC Oxazepam (BZO) 03/25/2023 None Detected  NONE DETECTED (Cut Off Level 300 ng/mL) Final   POC Cocaine UR 03/25/2023 None Detected  NONE DETECTED (Cut Off Level 300 ng/mL) Final   POC Methamphetamine UR 03/25/2023 None Detected  NONE DETECTED (Cut Off Level 1000 ng/mL)  Final   POC Morphine 03/25/2023 None Detected  NONE DETECTED (Cut Off Level 300 ng/mL) Final   POC Methadone UR 03/25/2023 None Detected  NONE DETECTED (Cut Off Level 300 ng/mL) Final   POC Oxycodone UR 03/25/2023 None Detected  NONE DETECTED (Cut Off Level 100 ng/mL) Final   POC Marijuana UR 03/25/2023 None Detected  NONE DETECTED (Cut Off Level 50 ng/mL) Final   Preg Test, Ur 03/25/2023 NEGATIVE  NEGATIVE Final   Comment:        THE SENSITIVITY OF THIS METHODOLOGY IS >24 mIU/mL    Direct LDL 03/25/2023 74  0 - 99 mg/dL Final   Performed at [facility], [address], [place], [place] [address]    Allergies: Sulfa antibiotics and Aspirin  Medications:  PTA Medications  Medication Sig   ondansetron (ZOFRAN) 4 MG tablet Take 4 mg by mouth every 6 (six) hours as needed for nausea or vomiting.   colchicine 0.6 MG tablet Take 0.6 mg by mouth daily as needed (For gout).   atorvastatin (LIPITOR) 40 MG tablet Take 1 tablet (40 mg  total) by mouth at bedtime.   hydrochlorothiazide (HYDRODIURIL) 25 MG tablet Take 1 tablet (25 mg total) by mouth daily.   QUEtiapine (SEROQUEL) 200 MG tablet Take 1 tablet (200 mg total) by mouth at bedtime.   traZODone (DESYREL) 100 MG tablet Take 1 tablet (100 mg total) by mouth at bedtime.   Cholecalciferol (VITAMIN D3) 50 MCG (2000 UT) TABS Take 2,000 mcg by mouth daily.   albuterol (VENTOLIN HFA) 108 (90 Base) MCG/ACT inhaler Inhale 2 puffs into the lungs every 6 (six) hours as needed for wheezing or shortness of breath.   pantoprazole (PROTONIX) 40 MG tablet Take 1 tablet (40 mg total) by mouth daily. (Patient taking differently: Take 40 mg by mouth daily as needed (For heartburn or acid reflux).)   Blood Glucose Monitoring Suppl (ACCU-CHEK GUIDE ME) w/Device KIT daily. as directed   ACCU-CHEK GUIDE test strip test ONCE EVERY DAY   Accu-Chek Softclix Lancets lancets daily.   DENTA 5000 PLUS 1.1 % CREA dental cream Take by mouth 2 (two) times daily.   FLUoxetine (PROZAC) 40 MG capsule Take 40 mg by mouth daily.   baclofen (LIORESAL) 10 MG tablet Take 1 tablet (10 mg total) by mouth 3 (three) times daily. (Patient not taking: Reported on 06/24/2023)      Medical Decision Making  Admit to Observation. To be transferred to [facility] upon lab results. Medications:  Acetaminophen 650 mg PO Q 6hrs PRN Maalox 30 ml PO  Q 4 hrs PRN Milk of Magnesia 30 ml PO Daily PRN Hydroxyzine 25 mg PO TID PRN Trazodone 50 mg PO HS PRN  Ativan Detox protocol      Recommendations  Based on my evaluation the patient does not appear to have an emergency medical condition.  Olin Pia, NP 06/24/23  3:09 PM

## 2023-06-24 NOTE — Group Note (Signed)
Group Topic: Communication  Group Date: 06/24/2023 Start Time: 2000 End Time: 2030 Facilitators: Rae Lips B  Department: [facility]  Number of Participants: 3  Group Focus: acceptance Treatment Modality:  Individual Therapy Interventions utilized were leisure development Purpose: express feelings  Name: Kristina Huffman Date of Birth: 1961-02-15  MR: [id number]    Level of Participation: Pt did not attend meeting Quality of Participation: NA Interactions with others: NA Mood/Affect: NA Triggers (if applicable): NA Cognition: Na Progress: Gaining insight Response: NA Plan: patient will be encouraged to go to groups.   Patients Problems:  Patient Active Problem List   Diagnosis Date Noted   Alcohol abuse 06/24/2023   Alcohol addiction (HCC) 04/07/2022   Obstructive sleep apnea syndrome 05/08/2020   Acute respiratory failure due to COVID-19 [facility]) 10/14/2019   ARF (acute renal failure) (HCC) 10/14/2019   Essential hypertension 10/14/2019   Controlled type 2 diabetes mellitus with hyperglycemia (HCC) 10/14/2019   Alcohol use disorder, severe, dependence (HCC) 06/06/2018   Deviated nasal septum 12/03/2017   Mixed hyperlipidemia 11/29/2017   Gastroesophageal reflux disease 10/06/2017   Chronic hepatitis C without hepatic coma (HCC) 06/04/2016   Benzodiazepine dependence (HCC) 09/21/2011   Bipolar 1 disorder, mixed, moderate (HCC) 09/21/2011   PTSD (post-traumatic stress disorder) 09/21/2011

## 2023-06-25 DIAGNOSIS — F319 Bipolar disorder, unspecified: Secondary | ICD-10-CM | POA: Diagnosis not present

## 2023-06-25 DIAGNOSIS — F10239 Alcohol dependence with withdrawal, unspecified: Secondary | ICD-10-CM | POA: Diagnosis not present

## 2023-06-25 DIAGNOSIS — F419 Anxiety disorder, unspecified: Secondary | ICD-10-CM | POA: Diagnosis not present

## 2023-06-25 DIAGNOSIS — F10229 Alcohol dependence with intoxication, unspecified: Secondary | ICD-10-CM | POA: Diagnosis not present

## 2023-06-25 LAB — RPR: RPR Ser Ql: NONREACTIVE

## 2023-06-25 MED ORDER — GABAPENTIN 300 MG PO CAPS
300.0000 mg | ORAL_CAPSULE | Freq: Three times a day (TID) | ORAL | Status: DC
  Administered 2023-06-25 – 2023-06-29 (×11): 300 mg via ORAL
  Filled 2023-06-25 (×11): qty 1

## 2023-06-25 NOTE — ED Notes (Signed)
Patient received Lunch.

## 2023-06-25 NOTE — Tx Team (Signed)
LCSW, MD, and Resident attempted to meet with patient to assess current mood, affect, physical state, and inquire about needs/goals while here in [facility] and after discharge. Patient reported she was not interested in having further discussion regarding her reasons for admitting and past psychiatric history. Patient informed the team that she is not interested in Residential placement at time of discharge. Patient reports she would like to return back to her CDIOP once she has detox. Team has agreed to speak with patient at a later time.  Fernande Boyden, LCSW Clinical Social Worker [facility] Ph: [phone]

## 2023-06-25 NOTE — ED Notes (Signed)
Patient received breakfast. 

## 2023-06-25 NOTE — Group Note (Signed)
Group Topic: Balance in Life  Group Date: 06/25/2023 Start Time: 2000 End Time: 2030 Facilitators: Guss Bunde  Department: [facility]  Number of Participants: 5  Group Focus: affirmation Treatment Modality:  Psychoeducation Interventions utilized were group exercise Purpose: reinforce self-care  Name: Kristina Huffman Date of Birth: [birth year]  MR: [id number]    Level of Participation: active Quality of Participation: cooperative Interactions with others: gave feedback Mood/Affect: appropriate Triggers (if applicable):  Cognition: goal directed Progress: Gaining insight Response: Pt wants to continue therapy outpatient Plan: patient will be encouraged to follow up on goals  Patients Problems:  Patient Active Problem List   Diagnosis Date Noted   Alcohol abuse 06/24/2023   Alcohol addiction (HCC) 04/07/2022   Obstructive sleep apnea syndrome 05/08/2020   Acute respiratory failure due to COVID-19 [facility]) 10/14/2019   ARF (acute renal failure) (HCC) 10/14/2019   Essential hypertension 10/14/2019   Controlled type 2 diabetes mellitus with hyperglycemia (HCC) 10/14/2019   Alcohol use disorder, severe, dependence (HCC) 06/06/2018   Deviated nasal septum 12/03/2017   Mixed hyperlipidemia 11/29/2017   Gastroesophageal reflux disease 10/06/2017   Chronic hepatitis C without hepatic coma (HCC) 06/04/2016   Benzodiazepine dependence (HCC) 09/21/2011   Bipolar 1 disorder, mixed, moderate (HCC) 09/21/2011   PTSD (post-traumatic stress disorder) 09/21/2011

## 2023-06-25 NOTE — ED Notes (Signed)
Pt is in the dayroom watching TV with peers. Pt complained of not having dinner because the food served was not well made. Writer offered sandwich. Pt denies SI/HI/AVH. No acute distress noted. Will continue to monitor for safety.

## 2023-06-25 NOTE — Group Note (Signed)
Group Topic: Relapse and Recovery  Group Date: 06/25/2023 Start Time: 1000 End Time: 1030 Facilitators: Merlene Morse, [place]  Department: [facility]  Number of Participants: 5  Group Focus: relapse prevention Treatment Modality:  Behavior Modification Therapy Interventions utilized were group exercise Purpose: express feelings  Name: Kristina Huffman Date of Birth: 07/26/1961  MR: [id number]    Level of Participation: active Quality of Participation: attentive and cooperative Interactions with others: gave feedback Mood/Affect: appropriate Triggers (if applicable): N/A Cognition: coherent/clear Progress: Moderate Response: N/A Plan: patient will be encouraged to continue active participation in treatment.    Patients Problems:  Patient Active Problem List   Diagnosis Date Noted   Alcohol abuse 06/24/2023   Alcohol addiction (HCC) 04/07/2022   Obstructive sleep apnea syndrome 05/08/2020   Acute respiratory failure due to COVID-19 [facility]) 10/14/2019   ARF (acute renal failure) (HCC) 10/14/2019   Essential hypertension 10/14/2019   Controlled type 2 diabetes mellitus with hyperglycemia (HCC) 10/14/2019   Alcohol use disorder, severe, dependence (HCC) 06/06/2018   Deviated nasal septum 12/03/2017   Mixed hyperlipidemia 11/29/2017   Gastroesophageal reflux disease 10/06/2017   Chronic hepatitis C without hepatic coma (HCC) 06/04/2016   Benzodiazepine dependence (HCC) 09/21/2011   Bipolar 1 disorder, mixed, moderate (HCC) 09/21/2011   PTSD (post-traumatic stress disorder) 09/21/2011

## 2023-06-25 NOTE — ED Notes (Signed)
Patient is alert and oriented X 4. She denies SI/HI or AVH. She is calm, cooperative, and is seen interacting with peers and staff appropriately. Patient states she doesn't feel well at this moment. Endorses nausea and irritability. Medications given. Patient declined Protonix and baclofen. Patient also decided to only take 300 mg of gabapentin. She states this is how she takes the med at home and taking 600 mg is too sedating. Providers made aware. Patient denies any needs at this time. We will continue to monitor for safety.

## 2023-06-25 NOTE — ED Notes (Signed)
Pt is currently sleeping, no distress noted, environmental check complete, will continue to monitor patient for safety.  

## 2023-06-25 NOTE — Discharge Instructions (Signed)
[facility] [address] [place], [place], [address] [phone] phone  New Patient Assessment/Therapy Walk-Ins:  Monday and Wednesday: 8 am until slots are full. Every 1st and 2nd Fridays of the month: 1 pm - 5 pm.  NO ASSESSMENT/THERAPY WALK-INS ON TUESDAYS OR THURSDAYS  New Patient Assessment/Medication Management Walk-Ins:  Monday - Friday:  8 am - 11 am.  For all walk-ins, we ask that you arrive by 7:30 am because patients will be seen in the order of arrival.  Availability is limited; therefore, you may not be seen on the same day that you walk-in.  Our goal is to serve and meet the needs of our community to the best of our ability.  SUBSTANCE USE TREATMENT for Medicaid and State Funded/IPRS  [facility] [address], [place], [address] [phone] phone NOTE: [facility] is no longer offering IOP services.  Serves those who are low-income or have no insurance.  [facility] [address], [place], [place], [address] [phone] phone [phone] fax NOTE: Does have Substance Abuse-Intensive Outpatient Program (SAIOP) as well as transitional housing if eligible.  [facility] [address] [place], [place], [address] [phone] phone [phone] fax  [facility] [address] [place], [place], [address] [phone] phone [phone] fax  HALFWAY HOUSES:  [facility] [phone]  [facility] www.oxfordvacancies.com  12 STEP PROGRAMS:  Alcoholics Anonymous of [place] https://aagreensboronc.com/meeting  Narcotics Anonymous of [place] https://greensborona.org/meetings/  Al-Anon of [place], [place] www.greens[place]alanon.org/find-meetings.html  Nar-Anon https://nar-anon.org/find-a-meetin  List of Residential placements:   [facility] in [place]: [phone]  [facility]: [phone]  [facility]: [place], [place]  [phone]: Female and female facility; 30-day program: (uninsured and Medicaid such as [facility], [facility], [facility], [facility])  [facility]: [phone]; men and women's facility; 28 days; Can have Medicaid tailored plan ([facility] or [facility])  [facility]: [phone] Ang[place] or [place]; 28 day program; must be fully detox; tailored Medicaid or no insurance  [facility] in [place], [place]; [phone]; 28 day all males program; no insurance accepted  [facility] in [place]: Joe [phone] (no insurance or Medicaid only); 90 days; outpatient services but provide housing in apartments [place]  [facility] Admission: [phone]: Patient must complete phone screening for placement: [place], [place]; 6 month program; uninsured, Medicaid, and [facility] insurance.   [facility]: no insurance required; [phone]  [place] [facility]: [phone]; Intake: Robert; Must fill out application online; Victor Delay [phone] x [phone]  [facility] in [place], [place]: [phone]; Admissions Coordinators Mr. Dennis or David Gibson; 90 day program.  [facility]: [place], [place] [phone]; Co-Ed 9 month to a year program; Online application; Men entry fee is $500 (6-12months);  [facility]: [address] [place], [place] [address]; no fee or insurance required; minimum of 2 years; Highly structured; work based; Intake Coordinator is Chris [phone]  [facility] in [place], [place]: [phone]; Fax number is [phone]; website: [facility].org; Requires 3-6 page autobiography; 2 year program (18 months and then 6month transitional housing); Admission fee is $300; no insurance needed; work program  [facility] in [place], [place]: Front Desk Staff: Reeci [phone]: They have a Men's Regenerations Program 6-9months. Free program; There is an initial $300 fee however, they are willing to work  with patients regarding that. Application is online.  [facility]: Admissions [phone] Benjamin Cox ext [phone]; Any 7-90 day program is out of pocket; 12   month program is free of charge; there is a $275 entry fee; Patient is responsible for own transportation 

## 2023-06-25 NOTE — ED Provider Notes (Signed)
Facility Based Crisis Admission H&P  Date: 06/25/23 Patient Name: Kristina Huffman MRN: [id number] Chief Complaint: "I rather not say"  Diagnoses:  Final diagnoses:  Alcohol abuse  Anxious appearance  Alcohol use disorder, severe, dependence (HCC)    HPI:  Kristina Huffman is a 62 yo female with a PPHx of alcohol use disorder severe, MDD recurrent severe, bipolar I disorder, and anxiety, presenting to voluntarily to [facility] on 06/24/2023 following a relapse of heavy alcohol consumption in the setting of an unspecified traumatic event, admitted to [facility] on same day for substance detoxification and crisis stabilization. EtOH level is 180 on arrival, UDS negative. Patient has been receiving CDIOP OP follow up with [facility].   Attempted to assess patient on the unit with treatment team, patient was very hesitant to disclose any information regarding her past psychiatric history, social history, and reason for being admitted.  She request that she be interviewed the following day as she feels uncomfortable discussing any personal information. Patient reports she has been experiencing cravings since being admitted to [facility].  She declines offer to seek out residential rehabilitation treatment centers, would prefer to discharge back to [facility] IOP.  Attempted to reevaluate patient later in the afternoon, she continued to decline to be interviewed today and requested that this be done tomorrow.  Past Psychiatric Hx: Current Psychiatrist: Dr. Maggie Huffman Received CDIOP follow up at [facility] Previous Psychiatric Diagnoses: MDD, bipolar I disorder, anxiety, alcohol use disorder Current psychiatric medications:Trazodone, Prozac, and Seroquel  Substance Abuse Hx: Alcohol: per urgent care documentation on 10/2 --"Patient reports that she basically drinks wine, up to 1 and 1/2 liters every day and "can't stop drinking". " Rehab hx: [facility] 03/25/2023-03/26/2023 for alcohol detox, requested to be discharged early and not complete detox at  [facility]. Was discharged home with services for CDIOP. [facility] in 04/07/2022 - "walk in with increased depression, SI and Alcohol intoxication. She was admitted to the continuous assessment unit. She was reevaluated at this time and recommended for admission to the [facility] to continue alcohol detox. " -- at the time referred to [facility] residential treatment center  Past Medical History: Medical Dx: Aneurysm of splenic artery, asthma, diabetes, GERD, hep C, hyperlipidemia, OSA  Medications: Lipitor 40 mg daily Vit D Pantoprazole Allergies: Sulfa antibiotics, aspirin  Family Psychiatric History: Per chart review  in 2018-07-22 - Son died from intentional overdose of substances   Social History: Living Situation:Originally from [place], moved to [place] 24 years ago. Has been living in [place] for the past 10 years.   Marital Status: separated for the past 3 years,  Children: 3 children, 77 yo daughter, 72 yo son, 78 yo son Per chart review patient gave her second and third child up for adoption. Legal:denies Military: None identified on chart review Access to firearms: denies   PHQ 2-9:  Flowsheet Row ED from 06/24/2023 in [facility] Counselor from 06/12/2023 in [facility] Counselor from 01/08/2023 in [facility] at [facility]  Thoughts that you would be better off dead, or of hurting yourself in some way Not at all Not at all Not at all  PHQ-9 Total Score 14 7 13        Flowsheet Row ED from 06/24/2023 in [facility] Most recent reading at 06/24/2023 11:36 PM ED from 06/24/2023 in [facility] Most recent reading at 06/24/2023  4:13 PM ED from 06/09/2023 in [facility] Most recent  reading at 06/09/2023  5:46 PM  C-SSRS RISK CATEGORY No Risk No Risk No Risk       Screenings    Flowsheet Row Most Recent Value  CIWA-Ar Total 8  COWS Total  Score 0       Total Time spent with patient: 45 minutes  Musculoskeletal  Strength & Muscle Tone: within normal limits Gait & Station: normal Patient leans: N/A  Psychiatric Specialty Exam  Presentation General Appearance:  Disheveled  Eye Contact: Fair  Speech: Clear and Coherent; Normal Rate  Speech Volume: Normal  Handedness: -- (Not assessed)   Mood and Affect  Mood: -- ("I do not want to say too much")  Affect: Depressed   Thought Process  Thought Processes: Linear  Descriptions of Associations:Intact  Orientation:-- (Not assessed)  Thought Content:-- (Guarded, suspicious)  Diagnosis of Schizophrenia or Schizoaffective disorder in past: No  Duration of Psychotic Symptoms: No data recorded Hallucinations:Hallucinations: -- (Deferred, did not appear to be responding to internal stimuli)  Ideas of Reference:-- (Patient appeared suspicious of medical staff)  Suicidal Thoughts:Suicidal Thoughts: No  Homicidal Thoughts:Homicidal Thoughts: No   Sensorium  Memory: Immediate Good; Recent Good; Remote Good  Judgment: Fair  Insight: Shallow   Executive Functions  Concentration: Fair  Attention Span: Fair  Recall: Fair  Fund of Knowledge: Fair  Language: Fair   Psychomotor Activity  Psychomotor Activity: Psychomotor Activity: Normal   Assets  Assets: Social Support; Housing   Sleep  Sleep: Sleep: Fair Number of Hours of Sleep: 3   Nutritional Assessment (For OBS and [facility] admissions only) Has the patient had a weight loss or gain of 10 pounds or more in the last 3 months?: No Has the patient had a decrease in food intake/or appetite?: Yes Does the patient have dental problems?: No Does the patient have eating habits or behaviors that may be indicators of an eating disorder including binging or inducing vomiting?: No Has the patient recently lost weight without trying?: 0 Has the patient been eating poorly because of a  decreased appetite?: 1 Malnutrition Screening Tool Score: 1    Physical Exam Vitals and nursing note reviewed.  Constitutional:      General: She is not in acute distress.    Appearance: She is not ill-appearing.  HENT:     Head: Normocephalic and atraumatic.  Pulmonary:     Effort: Pulmonary effort is normal. No respiratory distress.  Skin:    General: Skin is warm and dry.  Neurological:     General: No focal deficit present.  Psychiatric:        Behavior: Behavior is withdrawn.    Review of Systems  All other systems reviewed and are negative.   Blood pressure (!) 120/93, pulse 92, temperature 97.9 F (36.6 C), temperature source Oral, resp. rate 18, last menstrual period 08/02/2013, SpO2 92%. There is no height or weight on file to calculate BMI.   Last Labs:  Admission on 06/24/2023, Discharged on 06/24/2023  Component Date Value Ref Range Status   WBC 06/24/2023 4.8  4.0 - 10.5 K/uL Final   RBC 06/24/2023 4.65  3.87 - 5.11 MIL/uL Final   Hemoglobin 06/24/2023 14.2  12.0 - 15.0 g/dL Final   HCT 16/06/9603 42.4  36.0 - 46.0 % Final   MCV 06/24/2023 91.2  80.0 - 100.0 fL Final   MCH 06/24/2023 30.5  26.0 - 34.0 pg Final   MCHC 06/24/2023 33.5  30.0 - 36.0 g/dL Final   RDW 54/05/8118 13.2  11.5 - 15.5 % Final   Platelets 06/24/2023 240  150 - 400 K/uL Final   nRBC 06/24/2023 0.0  0.0 - 0.2 % Final   Neutrophils Relative % 06/24/2023 53  % Final   Neutro Abs 06/24/2023 2.6  1.7 - 7.7 K/uL Final   Lymphocytes Relative 06/24/2023 37  % Final   Lymphs Abs 06/24/2023 1.7  0.7 - 4.0 K/uL Final   Monocytes Relative 06/24/2023 7  % Final   Monocytes Absolute 06/24/2023 0.3  0.1 - 1.0 K/uL Final   Eosinophils Relative 06/24/2023 2  % Final   Eosinophils Absolute 06/24/2023 0.1  0.0 - 0.5 K/uL Final   Basophils Relative 06/24/2023 1  % Final   Basophils Absolute 06/24/2023 0.0  0.0 - 0.1 K/uL Final   Immature Granulocytes 06/24/2023 0  % Final   Abs Immature Granulocytes  06/24/2023 0.01  0.00 - 0.07 K/uL Final   Performed at [facility], [address], [place], [place] [address]   Sodium 06/24/2023 137  135 - 145 mmol/L Final   Potassium 06/24/2023 3.6  3.5 - 5.1 mmol/L Final   Chloride 06/24/2023 105  98 - 111 mmol/L Final   CO2 06/24/2023 21 (L)  22 - 32 mmol/L Final   Glucose, Bld 06/24/2023 103 (H)  70 - 99 mg/dL Final   Glucose reference range applies only to samples taken after fasting for at least 8 hours.   BUN 06/24/2023 8  6 - 20 mg/dL Final   Creatinine, Ser 06/24/2023 0.76  0.44 - 1.00 mg/dL Final   Calcium 32/44/0102 9.0  8.9 - 10.3 mg/dL Final   Total Protein 72/53/6644 7.0  6.5 - 8.1 g/dL Final   Albumin 03/47/4259 3.9  3.5 - 5.0 g/dL Final   AST 56/38/7564 52 (H)  15 - 41 U/L Final   ALT 06/24/2023 53 (H)  0 - 44 U/L Final   Alkaline Phosphatase 06/24/2023 79  38 - 126 U/L Final   Total Bilirubin 06/24/2023 1.1  0.3 - 1.2 mg/dL Final   GFR, Estimated 06/24/2023 >60  >60 mL/min Final   Comment: (NOTE) Calculated using the CKD-EPI Creatinine Equation (2021)    Anion gap 06/24/2023 11  5 - 15 Final   Performed at [facility], [address] [place], [place] [address]   Hgb A1c MFr Bld 06/24/2023 6.1 (H)  4.8 - 5.6 % Final   Comment: (NOTE) Pre diabetes:          5.7%-6.4%  Diabetes:              >6.4%  Glycemic control for   <7.0% adults with diabetes    Mean Plasma Glucose 06/24/2023 128.37  mg/dL Final   Performed at [facility], [address], [place], [place] [address]   Magnesium 06/24/2023 2.0  1.7 - 2.4 mg/dL Final   Performed at [facility], [address], [place], [place] [address]   Alcohol, Ethyl (B) 06/24/2023 180 (H)  <10 mg/dL Final   Comment: (NOTE) Lowest detectable limit for serum alcohol is 10 mg/dL.  For medical purposes only. Performed at [facility], [address], [place], [place] [address]    Cholesterol 06/24/2023 237 (H)  0 - 200 mg/dL Final   Triglycerides  06/24/2023 328 (H)  <150 mg/dL Final   HDL 09/32/3557 52  >40 mg/dL Final   Total CHOL/HDL Ratio 06/24/2023 4.6  RATIO Final   VLDL 06/24/2023 66 (H)  0 - 40 mg/dL Final   LDL Cholesterol 06/24/2023 119 (H)  0 - 99 mg/dL Final   Comment:        Total Cholesterol/HDL:CHD Risk Coronary Heart Disease Risk Table                     Men   Women  1/2 Average Risk   3.4   3.3  Average Risk       5.0   4.4  2 X Average Risk   9.6   7.1  3 X Average Risk  23.4   11.0        Use the calculated Patient Ratio above and the CHD Risk Table to determine the patient's CHD Risk.        ATP III CLASSIFICATION (LDL):  <100     mg/dL   Optimal  295-621  mg/dL   Near or Above                    Optimal  130-159  mg/dL   Borderline  308-657  mg/dL   High  >846     mg/dL   Very High Performed at [facility], [address], [place], [place] [address]    TSH 06/24/2023 2.250  0.350 - 4.500 uIU/mL Final   Comment: Performed by a 3rd Generation assay with a functional sensitivity of <=0.01 uIU/mL. Performed at [facility], [address] [place], [place] [address]    RPR Ser Ql 06/24/2023 NON REACTIVE  NON REACTIVE Final   Performed at [facility], [address] [place], [place] [address]   Color, Urine 06/24/2023 STRAW (A)  YELLOW Final   APPearance 06/24/2023 CLEAR  CLEAR Final   Specific Gravity, Urine 06/24/2023 1.002 (L)  1.005 - 1.030 Final   pH 06/24/2023 6.0  5.0 - 8.0 Final   Glucose, UA 06/24/2023 NEGATIVE  NEGATIVE mg/dL Final   Hgb urine dipstick 06/24/2023 NEGATIVE  NEGATIVE Final   Bilirubin Urine 06/24/2023 NEGATIVE  NEGATIVE Final   Ketones, ur 06/24/2023 NEGATIVE  NEGATIVE mg/dL Final   Protein, ur 02/72/5366 NEGATIVE  NEGATIVE mg/dL Final   Nitrite 44/11/4740 NEGATIVE  NEGATIVE Final   Leukocytes,Ua 06/24/2023 NEGATIVE  NEGATIVE Final   Performed at [facility], [address], [place], [place] [address]   POC Amphetamine UR 06/24/2023 None Detected   NONE DETECTED (Cut Off Level 1000 ng/mL) Final   POC Secobarbital (BAR) 06/24/2023 None Detected  NONE DETECTED (Cut Off Level 300 ng/mL) Final   POC Buprenorphine (BUP) 06/24/2023 None Detected  NONE DETECTED (Cut Off Level 10 ng/mL) Final   POC Oxazepam (BZO) 06/24/2023 None Detected  NONE DETECTED (Cut Off Level 300 ng/mL) Final   POC Cocaine UR 06/24/2023 None Detected  NONE DETECTED (Cut Off Level 300 ng/mL) Final   POC Methamphetamine UR 06/24/2023 None Detected  NONE DETECTED (Cut Off Level 1000 ng/mL) Final   POC Morphine 06/24/2023 None Detected  NONE DETECTED (Cut Off Level 300 ng/mL) Final   POC Methadone UR 06/24/2023 None Detected  NONE DETECTED (Cut Off Level 300 ng/mL) Final   POC Oxycodone UR 06/24/2023 None Detected  NONE DETECTED (Cut Off Level 100 ng/mL) Final   POC Marijuana UR 06/24/2023 None Detected  NONE DETECTED (Cut Off Level 50 ng/mL) Final  Admission on 03/25/2023, Discharged on 03/26/2023  Component Date Value Ref Range Status   WBC 03/25/2023 6.0  4.0 - 10.5 K/uL  Final   RBC 03/25/2023 4.95  3.87 - 5.11 MIL/uL Final   Hemoglobin 03/25/2023 15.6 (H)  12.0 - 15.0 g/dL Final   HCT 72/53/6644 46.1 (H)  36.0 - 46.0 % Final   MCV 03/25/2023 93.1  80.0 - 100.0 fL Final   MCH 03/25/2023 31.5  26.0 - 34.0 pg Final   MCHC 03/25/2023 33.8  30.0 - 36.0 g/dL Final   RDW 03/47/4259 12.7  11.5 - 15.5 % Final   Platelets 03/25/2023 239  150 - 400 K/uL Final   nRBC 03/25/2023 0.0  0.0 - 0.2 % Final   Neutrophils Relative % 03/25/2023 51  % Final   Neutro Abs 03/25/2023 3.1  1.7 - 7.7 K/uL Final   Lymphocytes Relative 03/25/2023 40  % Final   Lymphs Abs 03/25/2023 2.4  0.7 - 4.0 K/uL Final   Monocytes Relative 03/25/2023 6  % Final   Monocytes Absolute 03/25/2023 0.3  0.1 - 1.0 K/uL Final   Eosinophils Relative 03/25/2023 2  % Final   Eosinophils Absolute 03/25/2023 0.1  0.0 - 0.5 K/uL Final   Basophils Relative 03/25/2023 1  % Final   Basophils Absolute 03/25/2023 0.0   0.0 - 0.1 K/uL Final   Immature Granulocytes 03/25/2023 0  % Final   Abs Immature Granulocytes 03/25/2023 0.02  0.00 - 0.07 K/uL Final   Performed at [facility], [address], [place], [place] [address]   Sodium 03/25/2023 136  135 - 145 mmol/L Final   Potassium 03/25/2023 4.0  3.5 - 5.1 mmol/L Final   Chloride 03/25/2023 101  98 - 111 mmol/L Final   CO2 03/25/2023 22  22 - 32 mmol/L Final   Glucose, Bld 03/25/2023 112 (H)  70 - 99 mg/dL Final   Glucose reference range applies only to samples taken after fasting for at least 8 hours.   BUN 03/25/2023 11  6 - 20 mg/dL Final   Creatinine, Ser 03/25/2023 0.73  0.44 - 1.00 mg/dL Final   Calcium 56/43/3295 9.5  8.9 - 10.3 mg/dL Final   Total Protein 18/84/1660 7.2  6.5 - 8.1 g/dL Final   Albumin 63/09/6008 4.2  3.5 - 5.0 g/dL Final   AST 93/23/5573 51 (H)  15 - 41 U/L Final   ALT 03/25/2023 46 (H)  0 - 44 U/L Final   Alkaline Phosphatase 03/25/2023 86  38 - 126 U/L Final   Total Bilirubin 03/25/2023 1.1  0.3 - 1.2 mg/dL Final   GFR, Estimated 03/25/2023 >60  >60 mL/min Final   Comment: (NOTE) Calculated using the CKD-EPI Creatinine Equation (2021)    Anion gap 03/25/2023 13  5 - 15 Final   Performed at [facility], [address] [place], [place] [address]   Hgb A1c MFr Bld 03/25/2023 5.7 (H)  4.8 - 5.6 % Final   Comment: (NOTE) Pre diabetes:          5.7%-6.4%  Diabetes:              >6.4%  Glycemic control for   <7.0% adults with diabetes    Mean Plasma Glucose 03/25/2023 116.89  mg/dL Final   Performed at [facility], [address] [place], [place] [address]   Alcohol, Ethyl (B) 03/25/2023 211 (H)  <10 mg/dL Final   Comment: (NOTE) Lowest detectable limit for serum alcohol is 10 mg/dL.  For medical purposes only. Performed at [facility], [address] [place], [place] [address]  Cholesterol 03/25/2023 180  0 - 200 mg/dL Final   Triglycerides 65/78/4696 416 (H)  <150 mg/dL Final   HDL  29/52/8413 51  >40 mg/dL Final   Total CHOL/HDL Ratio 03/25/2023 3.5  RATIO Final   VLDL 03/25/2023 UNABLE TO CALCULATE IF TRIGLYCERIDE OVER 400 mg/dL  0 - 40 mg/dL Final   LDL Cholesterol 03/25/2023 UNABLE TO CALCULATE IF TRIGLYCERIDE OVER 400 mg/dL  0 - 99 mg/dL Final   Comment:        Total Cholesterol/HDL:CHD Risk Coronary Heart Disease Risk Table                     Men   Women  1/2 Average Risk   3.4   3.3  Average Risk       5.0   4.4  2 X Average Risk   9.6   7.1  3 X Average Risk  23.4   11.0        Use the calculated Patient Ratio above and the CHD Risk Table to determine the patient's CHD Risk.        ATP III CLASSIFICATION (LDL):  <100     mg/dL   Optimal  244-010  mg/dL   Near or Above                    Optimal  130-159  mg/dL   Borderline  272-536  mg/dL   High  >644     mg/dL   Very High Performed at [facility], [address], [place], [place] [address]    TSH 03/25/2023 3.277  0.350 - 4.500 uIU/mL Final   Comment: Performed by a 3rd Generation assay with a functional sensitivity of <=0.01 uIU/mL. Performed at [facility], [address], [place], [place] [address]    Preg Test, Ur 03/25/2023 Negative  Negative Final   POC Amphetamine UR 03/25/2023 None Detected  NONE DETECTED (Cut Off Level 1000 ng/mL) Final   POC Secobarbital (BAR) 03/25/2023 None Detected  NONE DETECTED (Cut Off Level 300 ng/mL) Final   POC Buprenorphine (BUP) 03/25/2023 None Detected  NONE DETECTED (Cut Off Level 10 ng/mL) Final   POC Oxazepam (BZO) 03/25/2023 None Detected  NONE DETECTED (Cut Off Level 300 ng/mL) Final   POC Cocaine UR 03/25/2023 None Detected  NONE DETECTED (Cut Off Level 300 ng/mL) Final   POC Methamphetamine UR 03/25/2023 None Detected  NONE DETECTED (Cut Off Level 1000 ng/mL) Final   POC Morphine 03/25/2023 None Detected  NONE DETECTED (Cut Off Level 300 ng/mL) Final   POC Methadone UR 03/25/2023 None Detected  NONE DETECTED (Cut Off Level 300 ng/mL)  Final   POC Oxycodone UR 03/25/2023 None Detected  NONE DETECTED (Cut Off Level 100 ng/mL) Final   POC Marijuana UR 03/25/2023 None Detected  NONE DETECTED (Cut Off Level 50 ng/mL) Final   Preg Test, Ur 03/25/2023 NEGATIVE  NEGATIVE Final   Comment:        THE SENSITIVITY OF THIS METHODOLOGY IS >24 mIU/mL    Direct LDL 03/25/2023 74  0 - 99 mg/dL Final   Performed at [facility], [address], [place], [place] [address]    Allergies: Sulfa antibiotics and Aspirin  Medications:  Facility Ordered Medications  Medication   [COMPLETED] thiamine (VITAMIN B1) injection 100 mg   acetaminophen (TYLENOL) tablet 650 mg   alum & mag hydroxide-simeth (MAALOX/MYLANTA) 200-200-20 MG/5ML suspension 30 mL   magnesium hydroxide (  MILK OF MAGNESIA) suspension 30 mL   thiamine (VITAMIN B1) injection 100 mg   thiamine (VITAMIN B1) tablet 100 mg   multivitamin with minerals tablet 1 tablet   LORazepam (ATIVAN) tablet 1 mg   hydrOXYzine (ATARAX) tablet 25 mg   loperamide (IMODIUM) capsule 2-4 mg   ondansetron (ZOFRAN-ODT) disintegrating tablet 4 mg   LORazepam (ATIVAN) tablet 1 mg   Followed by   LORazepam (ATIVAN) tablet 1 mg   Followed by   Melene Muller ON 06/26/2023] LORazepam (ATIVAN) tablet 1 mg   Followed by   Melene Muller ON 06/28/2023] LORazepam (ATIVAN) tablet 1 mg   OLANZapine zydis (ZYPREXA) disintegrating tablet 10 mg   And   LORazepam (ATIVAN) tablet 1 mg   And   ziprasidone (GEODON) injection 20 mg   atorvastatin (LIPITOR) tablet 40 mg   baclofen (LIORESAL) tablet 10 mg   colchicine tablet 0.6 mg   FLUoxetine (PROZAC) capsule 40 mg   hydrochlorothiazide (HYDRODIURIL) tablet 25 mg   pantoprazole (PROTONIX) EC tablet 40 mg   QUEtiapine (SEROQUEL) tablet 100 mg   traZODone (DESYREL) tablet 100 mg   albuterol (VENTOLIN HFA) 108 (90 Base) MCG/ACT inhaler 1 puff   cholecalciferol (VITAMIN D3) 25 MCG (1000 UNIT) tablet 2,000 Units   gabapentin (NEURONTIN) capsule 300 mg   PTA  Medications  Medication Sig   ondansetron (ZOFRAN) 4 MG tablet Take 4 mg by mouth every 6 (six) hours as needed for nausea or vomiting.   colchicine 0.6 MG tablet Take 0.6 mg by mouth daily as needed (For gout).   atorvastatin (LIPITOR) 40 MG tablet Take 1 tablet (40 mg total) by mouth at bedtime.   hydrochlorothiazide (HYDRODIURIL) 25 MG tablet Take 1 tablet (25 mg total) by mouth daily.   QUEtiapine (SEROQUEL) 200 MG tablet Take 1 tablet (200 mg total) by mouth at bedtime.   traZODone (DESYREL) 100 MG tablet Take 1 tablet (100 mg total) by mouth at bedtime.   Cholecalciferol (VITAMIN D3) 50 MCG (2000 UT) TABS Take 2,000 mcg by mouth daily.   albuterol (VENTOLIN HFA) 108 (90 Base) MCG/ACT inhaler Inhale 2 puffs into the lungs every 6 (six) hours as needed for wheezing or shortness of breath.   pantoprazole (PROTONIX) 40 MG tablet Take 1 tablet (40 mg total) by mouth daily. (Patient taking differently: Take 40 mg by mouth daily as needed (For heartburn or acid reflux).)   Blood Glucose Monitoring Suppl (ACCU-CHEK GUIDE ME) w/Device KIT daily. as directed   ACCU-CHEK GUIDE test strip test ONCE EVERY DAY   Accu-Chek Softclix Lancets lancets daily.   DENTA 5000 PLUS 1.1 % CREA dental cream Take by mouth 2 (two) times daily.   baclofen (LIORESAL) 10 MG tablet Take 1 tablet (10 mg total) by mouth 3 (three) times daily. (Patient not taking: Reported on 06/24/2023)   FLUoxetine (PROZAC) 40 MG capsule Take 40 mg by mouth daily.    Long Term Goals: Improvement in symptoms so as ready for discharge  Short Term Goals: Patient will verbalize feelings in meetings with treatment team members. and Patient will attend at least of 50% of the groups daily.  Medical Decision Making  Psychiatric Diagnoses: Alcohol use disorder, severe Alcohol withdrawal   Alcohol Use Disorder, severe EtOH 180 on arrival to [facility] CMP (10/2) showing elevated LFTS: Ast 52, ALT 53 CIWA, with ativan taper Multivitamin Thiamine  100 mg daily Gabapentin 300 mg 3 times daily   Bipolar I Disorder Seroquel  Prozac Trazodone  HLD Lipitor  GERD Protonix  HTN Hydrochlorothiazide 25 mg    Other PRNs: Tylenol 650 mg every 6 hours as needed Albuterol every 6 hours as needed Maalox Mylanta every 4 hours as needed Colchicine 0.6 mg daily as needed for gout Atarax 25 mg every 6 hours as needed for anxiety/agitation or CIWA <or = 10 Loperamide 2-4 mg as needed Agitation protocol (apraxic, Ativan, Geodon) Ativan 1 mg every 6 hours as needed for CIWA >10 Milk of Magnesia daily as needed Zofran ODT 4 mg every 6 hours as needed for nausea and vomiting    Labs/Imaging Reviewed: CBC unremarkable; CMP significant for elevated LFTs, mild hyperglycemia 103; A1c 6.1%; magnesium WNL; ethanol 180; lipid panel showing cholesterol 237, TGs 328, VLDL 66, LDL 119; TSH WNL; RPR nonreactive; UA unremarkable; UDS negative; COVID PCR pending  EKG on 06/24/2023: QTc 437   Disposition:  Declined residential rehabilitation.  Request discharge with CD IOP follow-up.   Recommendations  Based on my evaluation the patient does not appear to have an emergency medical condition.  Lorri Frederick, MD 06/25/23  2:23 PM

## 2023-06-25 NOTE — ED Notes (Signed)
Patient observed resting quietly, eyes closed. Respirations equal and unlabored. Will continue to monitor for safety.  

## 2023-06-25 NOTE — ED Notes (Signed)
Dinner provided.

## 2023-06-26 ENCOUNTER — Ambulatory Visit (HOSPITAL_COMMUNITY): Payer: MEDICAID

## 2023-06-26 ENCOUNTER — Encounter (HOSPITAL_COMMUNITY): Payer: Self-pay

## 2023-06-26 DIAGNOSIS — F10229 Alcohol dependence with intoxication, unspecified: Secondary | ICD-10-CM | POA: Diagnosis not present

## 2023-06-26 DIAGNOSIS — F319 Bipolar disorder, unspecified: Secondary | ICD-10-CM | POA: Diagnosis not present

## 2023-06-26 DIAGNOSIS — F10239 Alcohol dependence with withdrawal, unspecified: Secondary | ICD-10-CM | POA: Diagnosis not present

## 2023-06-26 DIAGNOSIS — F419 Anxiety disorder, unspecified: Secondary | ICD-10-CM | POA: Diagnosis not present

## 2023-06-26 NOTE — Group Note (Signed)
Group Topic: Fears and Unhealthy Coping Skills  Group Date: 06/26/2023 Start Time: 1230 End Time: 1250 Facilitators: Bertine Schlottman, Jacklynn Barnacle, RN  Department: [facility]  Number of Participants: 4  Group Focus: abuse issues, check in, community group, coping skills, leisure skills, nursing group, and relaxation Treatment Modality:  Patient-Centered Therapy Interventions utilized were exploration, group exercise, and support Purpose: enhance coping skills and relapse prevention strategies  Name: Kristina Huffman Date of Birth: 1961/05/01  MR: [id number]    Level of Participation: minimal Quality of Participation: cooperative and quiet Interactions with others: responds approprietly when cued, no active engagement on own Mood/Affect: flat Triggers (if applicable): n/a Cognition: coherent/clear Progress: Minimal Response: "I like to talk to people - other people in recovery, my sponsor. I also like music."  Plan: patient will be encouraged to continue detox and recovery process, build and maintain use of healthy coping skills  Patients Problems:  Patient Active Problem List   Diagnosis Date Noted   Alcohol abuse 06/24/2023   Alcohol addiction (HCC) 04/07/2022   Obstructive sleep apnea syndrome 05/08/2020   Acute respiratory failure due to COVID-19 [facility]) 10/14/2019   ARF (acute renal failure) (HCC) 10/14/2019   Essential hypertension 10/14/2019   Controlled type 2 diabetes mellitus with hyperglycemia (HCC) 10/14/2019   Alcohol use disorder, severe, dependence (HCC) 06/06/2018   Deviated nasal septum 12/03/2017   Mixed hyperlipidemia 11/29/2017   Gastroesophageal reflux disease 10/06/2017   Chronic hepatitis C without hepatic coma (HCC) 06/04/2016   Benzodiazepine dependence (HCC) 09/21/2011   Bipolar 1 disorder, mixed, moderate (HCC) 09/21/2011   PTSD (post-traumatic stress disorder) 09/21/2011

## 2023-06-26 NOTE — ED Provider Notes (Signed)
Behavioral Health Progress Note  Date and Time: 06/26/2023 6:27 PM Name: Kristina Huffman MRN:  [id number]  Subjective:  "I am feeling much better"  Diagnosis:  Final diagnoses:  Alcohol abuse  Anxious appearance  Alcohol use disorder, severe, dependence (HCC)   Kristina Huffman is a 62 year-old female who  presented to [facility] voluntarily complaining  of increased depression and anxiety along with alcohol abuse problem. She reported that she recently relapsed on alcohol, has been trying to detox herself "but getting very sick". Patient reported  that she has been getting services at [facility] services  for medication management and therapy. Her last visit was 06/17/23.  She reported  that medications and therapy were helping but she relapsed due to a traumatic event. Patient also reported  "I am from [place], you know drinking is in our culture....".  Patient reported  that she basically drinks wine, up to 1 and 1/2 liters every day and "can't stop drinking". Last use was prior to coming here.  Patient reported  that "each time I try to stop, I get very sick".   She reported feeling depressed anxious and sad. She reported that her goal is to get medically detox and continue the services at [facility]. Patient denied use of other substances. She  reported a family hx of alcohol abuse: her grandmother had alcohol problem. Her two brothers also have alcohol abuse problem.  Patient reported that her family and friends are supportive. She reported  decreased appetite. Reported not sleeping well. She reported that her goal is to get clean again and continue services in outpatient setting.   Assessment:  Patient is evaluated face-to-face  by this NP. She was attending group with peers and was given privacy for assessment. She is pleasant and cooperative upon approach. She is appropriately dressed and groomed. Alert an oriented x 4. Her thought process is clear and goal-directed.  She is not preoccupied. She  denies SI/HI/AVH. Patient reports no severe withdrawal symptoms. She reports that appetite is improving. CIWA = 7 in AM. She is more focused with no irritability. Patient reports that "I was having trouble  doing it myself, I am glad I came here". Patient continues to report that her goal is to stabilize and continue outpatient services for therapy and medication management.    Total Time spent with patient: 20 minutes  Past Psychiatric History: MDD, Alcohol abuse Past Medical History: NA Family History: Family Psychiatric  History:  All family members use alcohol Social History: NA  Additional Social History: Lives alone. Has family support                  Sleep: Negative  Appetite:  Negative  Current Medications:  Current Facility-Administered Medications  Medication Dose Route Frequency Provider Last Rate Last Admin   acetaminophen (TYLENOL) tablet 650 mg  650 mg Oral Q6H PRN Sindy Guadeloupe, NP       albuterol (VENTOLIN HFA) 108 (90 Base) MCG/ACT inhaler 1 puff  1 puff Inhalation Q6H PRN Nelly Rout, MD       alum & mag hydroxide-simeth (MAALOX/MYLANTA) 200-200-20 MG/5ML suspension 30 mL  30 mL Oral Q4H PRN Sindy Guadeloupe, NP       atorvastatin (LIPITOR) tablet 40 mg  40 mg Oral QHS Sindy Guadeloupe, NP   40 mg at 06/25/23 2125   baclofen (LIORESAL) tablet 10 mg  10 mg Oral TID Sindy Guadeloupe, NP       cholecalciferol (VITAMIN D3) 25 MCG (1000 UNIT) tablet 2,000  Units  2,000 Units Oral Daily Nelly Rout, MD   2,000 Units at 06/26/23 1610   colchicine tablet 0.6 mg  0.6 mg Oral Daily PRN Sindy Guadeloupe, NP       FLUoxetine (PROZAC) capsule 40 mg  40 mg Oral Daily Sindy Guadeloupe, NP   40 mg at 06/26/23 0843   gabapentin (NEURONTIN) capsule 300 mg  300 mg Oral TID Lorri Frederick, MD   300 mg at 06/26/23 1609   hydrochlorothiazide (HYDRODIURIL) tablet 25 mg  25 mg Oral Daily Sindy Guadeloupe, NP   25 mg at 06/26/23 9604   hydrOXYzine (ATARAX) tablet 25 mg  25 mg Oral Q6H PRN  Sindy Guadeloupe, NP       loperamide (IMODIUM) capsule 2-4 mg  2-4 mg Oral PRN Sindy Guadeloupe, NP       LORazepam (ATIVAN) tablet 1 mg  1 mg Oral Q6H PRN Sindy Guadeloupe, NP       LORazepam (ATIVAN) tablet 1 mg  1 mg Oral BID Sindy Guadeloupe, NP       Followed by   Melene Muller ON 06/28/2023] LORazepam (ATIVAN) tablet 1 mg  1 mg Oral Daily Sindy Guadeloupe, NP       OLANZapine zydis (ZYPREXA) disintegrating tablet 10 mg  10 mg Oral Q8H PRN Sindy Guadeloupe, NP       And   LORazepam (ATIVAN) tablet 1 mg  1 mg Oral PRN Sindy Guadeloupe, NP       And   ziprasidone (GEODON) injection 20 mg  20 mg Intramuscular PRN Sindy Guadeloupe, NP       magnesium hydroxide (MILK OF MAGNESIA) suspension 30 mL  30 mL Oral Daily PRN Sindy Guadeloupe, NP       multivitamin with minerals tablet 1 tablet  1 tablet Oral Daily Sindy Guadeloupe, NP   1 tablet at 06/26/23 0844   ondansetron (ZOFRAN-ODT) disintegrating tablet 4 mg  4 mg Oral Q6H PRN Sindy Guadeloupe, NP       pantoprazole (PROTONIX) EC tablet 40 mg  40 mg Oral Daily Sindy Guadeloupe, NP       QUEtiapine (SEROQUEL) tablet 100 mg  100 mg Oral BID Sindy Guadeloupe, NP   100 mg at 06/26/23 5409   thiamine (VITAMIN B1) injection 100 mg  100 mg Intramuscular Once Sindy Guadeloupe, NP       thiamine (VITAMIN B1) tablet 100 mg  100 mg Oral Daily Sindy Guadeloupe, NP   100 mg at 06/26/23 0844   traZODone (DESYREL) tablet 100 mg  100 mg Oral Dorthey Sawyer, NP   100 mg at 06/25/23 2125   Current Outpatient Medications  Medication Sig Dispense Refill   ACCU-CHEK GUIDE test strip test ONCE EVERY DAY     Accu-Chek Softclix Lancets lancets daily.     albuterol (VENTOLIN HFA) 108 (90 Base) MCG/ACT inhaler Inhale 2 puffs into the lungs every 6 (six) hours as needed for wheezing or shortness of breath. 8 g 0   atorvastatin (LIPITOR) 40 MG tablet Take 1 tablet (40 mg total) by mouth at bedtime. 30 tablet 0   baclofen (LIORESAL) 10 MG tablet Take 1 tablet (10 mg total) by mouth 3 (three) times daily. (Patient not  taking: Reported on 06/24/2023) 90 tablet 1   Blood Glucose Monitoring Suppl (ACCU-CHEK GUIDE ME) w/Device KIT daily. as directed     Cholecalciferol (VITAMIN D3) 50 MCG (2000 UT) TABS Take 2,000 mcg by mouth daily. 30 tablet 0   colchicine 0.6 MG tablet Take 0.6  mg by mouth daily as needed (For gout).     DENTA 5000 PLUS 1.1 % CREA dental cream Take by mouth 2 (two) times daily.     FLUoxetine (PROZAC) 40 MG capsule Take 40 mg by mouth daily.     gabapentin (NEURONTIN) 600 MG tablet Take 600 mg by mouth 3 (three) times daily.     hydrochlorothiazide (HYDRODIURIL) 25 MG tablet Take 1 tablet (25 mg total) by mouth daily. 30 tablet 0   Omega-3 Fatty Acids (FISH OIL PO) Take 1 capsule by mouth at bedtime.     ondansetron (ZOFRAN) 4 MG tablet Take 4 mg by mouth every 6 (six) hours as needed for nausea or vomiting.     pantoprazole (PROTONIX) 40 MG tablet Take 1 tablet (40 mg total) by mouth daily. (Patient taking differently: Take 40 mg by mouth daily as needed (For heartburn or acid reflux).) 30 tablet 0   QUEtiapine (SEROQUEL) 100 MG tablet Take 100 mg by mouth 2 (two) times daily.     QUEtiapine (SEROQUEL) 200 MG tablet Take 1 tablet (200 mg total) by mouth at bedtime. 30 tablet 0   traZODone (DESYREL) 100 MG tablet Take 1 tablet (100 mg total) by mouth at bedtime. 30 tablet 0    Labs  Lab Results:  Admission on 06/24/2023, Discharged on 06/24/2023  Component Date Value Ref Range Status   WBC 06/24/2023 4.8  4.0 - 10.5 K/uL Final   RBC 06/24/2023 4.65  3.87 - 5.11 MIL/uL Final   Hemoglobin 06/24/2023 14.2  12.0 - 15.0 g/dL Final   HCT 40/98/1191 42.4  36.0 - 46.0 % Final   MCV 06/24/2023 91.2  80.0 - 100.0 fL Final   MCH 06/24/2023 30.5  26.0 - 34.0 pg Final   MCHC 06/24/2023 33.5  30.0 - 36.0 g/dL Final   RDW 47/82/9562 13.2  11.5 - 15.5 % Final   Platelets 06/24/2023 240  150 - 400 K/uL Final   nRBC 06/24/2023 0.0  0.0 - 0.2 % Final   Neutrophils Relative % 06/24/2023 53  % Final    Neutro Abs 06/24/2023 2.6  1.7 - 7.7 K/uL Final   Lymphocytes Relative 06/24/2023 37  % Final   Lymphs Abs 06/24/2023 1.7  0.7 - 4.0 K/uL Final   Monocytes Relative 06/24/2023 7  % Final   Monocytes Absolute 06/24/2023 0.3  0.1 - 1.0 K/uL Final   Eosinophils Relative 06/24/2023 2  % Final   Eosinophils Absolute 06/24/2023 0.1  0.0 - 0.5 K/uL Final   Basophils Relative 06/24/2023 1  % Final   Basophils Absolute 06/24/2023 0.0  0.0 - 0.1 K/uL Final   Immature Granulocytes 06/24/2023 0  % Final   Abs Immature Granulocytes 06/24/2023 0.01  0.00 - 0.07 K/uL Final   Performed at [facility], [address], [place], [place] [address]   Sodium 06/24/2023 137  135 - 145 mmol/L Final   Potassium 06/24/2023 3.6  3.5 - 5.1 mmol/L Final   Chloride 06/24/2023 105  98 - 111 mmol/L Final   CO2 06/24/2023 21 (L)  22 - 32 mmol/L Final   Glucose, Bld 06/24/2023 103 (H)  70 - 99 mg/dL Final   Glucose reference range applies only to samples taken after fasting for at least 8 hours.   BUN 06/24/2023 8  6 - 20 mg/dL Final   Creatinine, Ser 06/24/2023 0.76  0.44 - 1.00 mg/dL Final   Calcium 57/84/6962 9.0  8.9 - 10.3 mg/dL Final  Total Protein 06/24/2023 7.0  6.5 - 8.1 g/dL Final   Albumin 16/06/9603 3.9  3.5 - 5.0 g/dL Final   AST 54/05/8118 52 (H)  15 - 41 U/L Final   ALT 06/24/2023 53 (H)  0 - 44 U/L Final   Alkaline Phosphatase 06/24/2023 79  38 - 126 U/L Final   Total Bilirubin 06/24/2023 1.1  0.3 - 1.2 mg/dL Final   GFR, Estimated 06/24/2023 >60  >60 mL/min Final   Comment: (NOTE) Calculated using the CKD-EPI Creatinine Equation (2021)    Anion gap 06/24/2023 11  5 - 15 Final   Performed at [facility], [address], [place], [place] [address]   Hgb A1c MFr Bld 06/24/2023 6.1 (H)  4.8 - 5.6 % Final   Comment: (NOTE) Pre diabetes:          5.7%-6.4%  Diabetes:              >6.4%  Glycemic control for   <7.0% adults with diabetes    Mean Plasma Glucose 06/24/2023 128.37  mg/dL  Final   Performed at [facility], [address], [place], [place] [address]   Magnesium 06/24/2023 2.0  1.7 - 2.4 mg/dL Final   Performed at [facility], [address], [place], [place] [address]   Alcohol, Ethyl (B) 06/24/2023 180 (H)  <10 mg/dL Final   Comment: (NOTE) Lowest detectable limit for serum alcohol is 10 mg/dL.  For medical purposes only. Performed at [facility], [address] [place], [place] [address]    Cholesterol 06/24/2023 237 (H)  0 - 200 mg/dL Final   Triglycerides 62/95/2841 328 (H)  <150 mg/dL Final   HDL 32/44/0102 52  >40 mg/dL Final   Total CHOL/HDL Ratio 06/24/2023 4.6  RATIO Final   VLDL 06/24/2023 66 (H)  0 - 40 mg/dL Final   LDL Cholesterol 06/24/2023 119 (H)  0 - 99 mg/dL Final   Comment:        Total Cholesterol/HDL:CHD Risk Coronary Heart Disease Risk Table                     Men   Women  1/2 Average Risk   3.4   3.3  Average Risk       5.0   4.4  2 X Average Risk   9.6   7.1  3 X Average Risk  23.4   11.0        Use the calculated Patient Ratio above and the CHD Risk Table to determine the patient's CHD Risk.        ATP III CLASSIFICATION (LDL):  <100     mg/dL   Optimal  725-366  mg/dL   Near or Above                    Optimal  130-159  mg/dL   Borderline  440-347  mg/dL   High  >425     mg/dL   Very High Performed at [facility], [address] [place], [place] [address]    TSH 06/24/2023 2.250  0.350 - 4.500 uIU/mL Final   Comment: Performed by a 3rd Generation assay with a functional sensitivity of <=0.01 uIU/mL. Performed at [facility], [address] [place], [place] [address]    RPR Ser Ql 06/24/2023 NON REACTIVE  NON REACTIVE Final   Performed at [facility], [address] [place], [place] [address]   Color, Urine 06/24/2023 STRAW (A)  YELLOW Final   APPearance 06/24/2023 CLEAR  CLEAR Final   Specific Gravity, Urine 06/24/2023 1.002 (L)  1.005 - 1.030 Final   pH 06/24/2023  6.0  5.0 - 8.0 Final   Glucose, UA 06/24/2023 NEGATIVE  NEGATIVE mg/dL Final   Hgb urine dipstick 06/24/2023 NEGATIVE  NEGATIVE Final   Bilirubin Urine 06/24/2023 NEGATIVE  NEGATIVE Final   Ketones, ur 06/24/2023 NEGATIVE  NEGATIVE mg/dL Final   Protein, ur 32/95/1884 NEGATIVE  NEGATIVE mg/dL Final   Nitrite 16/60/6301 NEGATIVE  NEGATIVE Final   Leukocytes,Ua 06/24/2023 NEGATIVE  NEGATIVE Final   Performed at [facility], [address], [place], [place] [address]   POC Amphetamine UR 06/24/2023 None Detected  NONE DETECTED (Cut Off Level 1000 ng/mL) Final   POC Secobarbital (BAR) 06/24/2023 None Detected  NONE DETECTED (Cut Off Level 300 ng/mL) Final   POC Buprenorphine (BUP) 06/24/2023 None Detected  NONE DETECTED (Cut Off Level 10 ng/mL) Final   POC Oxazepam (BZO) 06/24/2023 None Detected  NONE DETECTED (Cut Off Level 300 ng/mL) Final   POC Cocaine UR 06/24/2023 None Detected  NONE DETECTED (Cut Off Level 300 ng/mL) Final   POC Methamphetamine UR 06/24/2023 None Detected  NONE DETECTED (Cut Off Level 1000 ng/mL) Final   POC Morphine 06/24/2023 None Detected  NONE DETECTED (Cut Off Level 300 ng/mL) Final   POC Methadone UR 06/24/2023 None Detected  NONE DETECTED (Cut Off Level 300 ng/mL) Final   POC Oxycodone UR 06/24/2023 None Detected  NONE DETECTED (Cut Off Level 100 ng/mL) Final   POC Marijuana UR 06/24/2023 None Detected  NONE DETECTED (Cut Off Level 50 ng/mL) Final  Admission on 03/25/2023, Discharged on 03/26/2023  Component Date Value Ref Range Status   WBC 03/25/2023 6.0  4.0 - 10.5 K/uL Final   RBC 03/25/2023 4.95  3.87 - 5.11 MIL/uL Final   Hemoglobin 03/25/2023 15.6 (H)  12.0 - 15.0 g/dL Final   HCT 32/35/5732 46.1 (H)  36.0 - 46.0 % Final   MCV 03/25/2023 93.1  80.0 - 100.0 fL Final   MCH 03/25/2023 31.5  26.0 - 34.0 pg Final   MCHC 03/25/2023 33.8  30.0 - 36.0 g/dL Final   RDW 20/25/4270 12.7  11.5 - 15.5 % Final   Platelets 03/25/2023 239  150 - 400 K/uL Final    nRBC 03/25/2023 0.0  0.0 - 0.2 % Final   Neutrophils Relative % 03/25/2023 51  % Final   Neutro Abs 03/25/2023 3.1  1.7 - 7.7 K/uL Final   Lymphocytes Relative 03/25/2023 40  % Final   Lymphs Abs 03/25/2023 2.4  0.7 - 4.0 K/uL Final   Monocytes Relative 03/25/2023 6  % Final   Monocytes Absolute 03/25/2023 0.3  0.1 - 1.0 K/uL Final   Eosinophils Relative 03/25/2023 2  % Final   Eosinophils Absolute 03/25/2023 0.1  0.0 - 0.5 K/uL Final   Basophils Relative 03/25/2023 1  % Final   Basophils Absolute 03/25/2023 0.0  0.0 - 0.1 K/uL Final   Immature Granulocytes 03/25/2023 0  % Final   Abs Immature Granulocytes 03/25/2023 0.02  0.00 - 0.07 K/uL Final   Performed at [facility], [address], [place], [place] [address]   Sodium 03/25/2023 136  135 - 145 mmol/L Final   Potassium 03/25/2023 4.0  3.5 - 5.1 mmol/L Final   Chloride 03/25/2023 101  98 - 111 mmol/L Final  CO2 03/25/2023 22  22 - 32 mmol/L Final   Glucose, Bld 03/25/2023 112 (H)  70 - 99 mg/dL Final   Glucose reference range applies only to samples taken after fasting for at least 8 hours.   BUN 03/25/2023 11  6 - 20 mg/dL Final   Creatinine, Ser 03/25/2023 0.73  0.44 - 1.00 mg/dL Final   Calcium 78/46/9629 9.5  8.9 - 10.3 mg/dL Final   Total Protein 52/84/1324 7.2  6.5 - 8.1 g/dL Final   Albumin 40/06/2724 4.2  3.5 - 5.0 g/dL Final   AST 36/64/4034 51 (H)  15 - 41 U/L Final   ALT 03/25/2023 46 (H)  0 - 44 U/L Final   Alkaline Phosphatase 03/25/2023 86  38 - 126 U/L Final   Total Bilirubin 03/25/2023 1.1  0.3 - 1.2 mg/dL Final   GFR, Estimated 03/25/2023 >60  >60 mL/min Final   Comment: (NOTE) Calculated using the CKD-EPI Creatinine Equation (2021)    Anion gap 03/25/2023 13  5 - 15 Final   Performed at [facility], [address], [place], [place] [address]   Hgb A1c MFr Bld 03/25/2023 5.7 (H)  4.8 - 5.6 % Final   Comment: (NOTE) Pre diabetes:          5.7%-6.4%  Diabetes:              >6.4%  Glycemic  control for   <7.0% adults with diabetes    Mean Plasma Glucose 03/25/2023 116.89  mg/dL Final   Performed at [facility], [address] [place], [place] [address]   Alcohol, Ethyl (B) 03/25/2023 211 (H)  <10 mg/dL Final   Comment: (NOTE) Lowest detectable limit for serum alcohol is 10 mg/dL.  For medical purposes only. Performed at [facility], [address], [place], [place] [address]    Cholesterol 03/25/2023 180  0 - 200 mg/dL Final   Triglycerides 29/51/8841 416 (H)  <150 mg/dL Final   HDL 66/02/3015 51  >40 mg/dL Final   Total CHOL/HDL Ratio 03/25/2023 3.5  RATIO Final   VLDL 03/25/2023 UNABLE TO CALCULATE IF TRIGLYCERIDE OVER 400 mg/dL  0 - 40 mg/dL Final   LDL Cholesterol 03/25/2023 UNABLE TO CALCULATE IF TRIGLYCERIDE OVER 400 mg/dL  0 - 99 mg/dL Final   Comment:        Total Cholesterol/HDL:CHD Risk Coronary Heart Disease Risk Table                     Men   Women  1/2 Average Risk   3.4   3.3  Average Risk       5.0   4.4  2 X Average Risk   9.6   7.1  3 X Average Risk  23.4   11.0        Use the calculated Patient Ratio above and the CHD Risk Table to determine the patient's CHD Risk.        ATP III CLASSIFICATION (LDL):  <100     mg/dL   Optimal  010-932  mg/dL   Near or Above                    Optimal  130-159  mg/dL   Borderline  355-732  mg/dL   High  >202     mg/dL   Very High Performed at [facility], [address] [place], [place] [address]    TSH 03/25/2023  3.277  0.350 - 4.500 uIU/mL Final   Comment: Performed by a 3rd Generation assay with a functional sensitivity of <=0.01 uIU/mL. Performed at [facility], [address], [place], [place] [address]    Preg Test, Ur 03/25/2023 Negative  Negative Final   POC Amphetamine UR 03/25/2023 None Detected  NONE DETECTED (Cut Off Level 1000 ng/mL) Final   POC Secobarbital (BAR) 03/25/2023 None Detected  NONE DETECTED (Cut Off Level 300 ng/mL) Final   POC Buprenorphine (BUP)  03/25/2023 None Detected  NONE DETECTED (Cut Off Level 10 ng/mL) Final   POC Oxazepam (BZO) 03/25/2023 None Detected  NONE DETECTED (Cut Off Level 300 ng/mL) Final   POC Cocaine UR 03/25/2023 None Detected  NONE DETECTED (Cut Off Level 300 ng/mL) Final   POC Methamphetamine UR 03/25/2023 None Detected  NONE DETECTED (Cut Off Level 1000 ng/mL) Final   POC Morphine 03/25/2023 None Detected  NONE DETECTED (Cut Off Level 300 ng/mL) Final   POC Methadone UR 03/25/2023 None Detected  NONE DETECTED (Cut Off Level 300 ng/mL) Final   POC Oxycodone UR 03/25/2023 None Detected  NONE DETECTED (Cut Off Level 100 ng/mL) Final   POC Marijuana UR 03/25/2023 None Detected  NONE DETECTED (Cut Off Level 50 ng/mL) Final   Preg Test, Ur 03/25/2023 NEGATIVE  NEGATIVE Final   Comment:        THE SENSITIVITY OF THIS METHODOLOGY IS >24 mIU/mL    Direct LDL 03/25/2023 74  0 - 99 mg/dL Final   Performed at [facility], [address], [place], [place] [address]    Blood Alcohol level:  Lab Results  Component Value Date   ETH 180 (H) 06/24/2023   ETH 211 (H) 03/25/2023    Metabolic Disorder Labs: Lab Results  Component Value Date   HGBA1C 6.1 (H) 06/24/2023   MPG 128.37 06/24/2023   MPG 116.89 03/25/2023   No results found for: "PROLACTIN" Lab Results  Component Value Date   CHOL 237 (H) 06/24/2023   TRIG 328 (H) 06/24/2023   HDL 52 06/24/2023   CHOLHDL 4.6 06/24/2023   VLDL 66 (H) 06/24/2023   LDLCALC 119 (H) 06/24/2023   LDLCALC UNABLE TO CALCULATE IF TRIGLYCERIDE OVER 400 mg/dL 82/95/6213    Therapeutic Lab Levels: Lab Results  Component Value Date   LITHIUM <0.25 (L) 07/03/2013   LITHIUM <0.25 (L) 11/11/2011   No results found for: "VALPROATE" No results found for: "CBMZ"  Physical Findings   AIMS    Flowsheet Row [facility]  AIMS Total Score 0      AUDIT    Flowsheet Row Admission (Discharged)  [facility] Ad[facility] (Discharged) [facility] Hosp-Admission (Discharged) [facility]  Alcohol Use Disorder Identification Test Final Score (AUDIT) 33 23 33      GAD-7    Flowsheet Row Counselor from 06/12/2023 in [facility] Office Visit from 12/25/2022 in [facility] Office Visit from 11/12/2020 in [facility] at [facility] Office Visit from 03/18/2017 in [facility]  Total GAD-7 Score 14 21 0 7      PHQ2-9    Flowsheet Row ED from 06/24/2023 in [facility] Counselor from 06/12/2023 in [place]  [facility] Counselor from 01/08/2023 in [facility] Visit from 12/25/2022 in [facility] ED from 04/06/2022 in [facility]  PHQ-2 Total Score 2 3 4 5 1   PHQ-9 Total Score 6 7 13 23 17       Flowsheet Row ED from 06/24/2023 in [facility] Most recent reading at 06/24/2023 11:36 PM ED from 06/24/2023 in [facility] Most recent reading at 06/24/2023  4:13 PM ED from 06/09/2023 in [facility] [place] Most recent reading at 06/09/2023  5:46 PM  C-SSRS RISK CATEGORY No Risk No Risk No Risk        Musculoskeletal  Strength & Muscle Tone: within normal limits Gait & Station: normal Patient leans: N/A  Psychiatric Specialty Exam  Presentation  General Appearance:  Appropriate for Environment  Eye Contact: Good  Speech: Clear and Coherent  Speech Volume: Normal  Handedness: -- (Not assessed)   Mood and Affect  Mood: --  (calm)  Affect: Depressed   Thought Process  Thought Processes: Coherent  Descriptions of Associations:Intact  Orientation:Full (Time, Place and Person)  Thought Content:Logical  Diagnosis of Schizophrenia or Schizoaffective disorder in past: No  Duration of Psychotic Symptoms: No data recorded  Hallucinations:Hallucinations: None  Ideas of Reference:None  Suicidal Thoughts:Suicidal Thoughts: No  Homicidal Thoughts:Homicidal Thoughts: No   Sensorium  Memory: Immediate Good; Recent Good; Remote Good  Judgment: Fair  Insight: Fair   Chartered certified accountant: Fair  Attention Span: Fair  Recall: [employer] of Knowledge: Fair  Language: Fair   Psychomotor Activity  Psychomotor Activity: Psychomotor Activity: Normal   Assets  Assets: Communication Skills; Desire for Improvement; Social Support; Physical Health; Housing   Sleep  Sleep: Sleep: Fair Number of Hours of Sleep: 5   Nutritional Assessment (For OBS and FBC admissions only) Has the patient had a decrease in food intake/or appetite?: No Does the patient have dental problems?: No Does the patient have eating habits or behaviors that may be indicators of an eating disorder including binging or inducing vomiting?: No Has the patient recently lost weight without trying?: 0 Has the patient been eating poorly because of a decreased appetite?: 0 Malnutrition Screening Tool Score: 0    Physical Exam  Physical Exam Vitals and nursing note reviewed.  Constitutional:      Appearance: Normal appearance.  HENT:     Head: Normocephalic and atraumatic.     Right Ear: Tympanic membrane normal.     Left Ear: Tympanic membrane normal.     Nose: Nose normal.     Mouth/Throat:     Mouth: Mucous membranes are moist.  Eyes:     Extraocular Movements: Extraocular movements intact.     Pupils: Pupils are equal, round, and reactive to light.  Cardiovascular:     Rate and Rhythm: Normal  rate.     Pulses: Normal pulses.  Pulmonary:     Effort: Pulmonary effort is normal.  Musculoskeletal:        General: Normal range of motion.     Cervical back: Normal range of motion and neck supple.  Neurological:     General: No focal deficit present.     Mental Status: She is alert and oriented to person, place, and time.  Psychiatric:        Behavior: Behavior normal.  Thought Content: Thought content normal.    Review of Systems  Constitutional: Negative.   HENT: Negative.    Eyes: Negative.   Respiratory: Negative.    Cardiovascular: Negative.   Gastrointestinal: Negative.   Genitourinary: Negative.   Musculoskeletal: Negative.   Skin: Negative.   Neurological: Negative.   Endo/Heme/Allergies: Negative.   Psychiatric/Behavioral:  Positive for depression and substance abuse.    Blood pressure 125/85, pulse 92, temperature 98.3 F (36.8 C), temperature source Oral, resp. rate 19, last menstrual period 08/02/2013, SpO2 95%. There is no height or weight on file to calculate BMI.  Treatment Plan Summary: Daily contact with patient to assess and evaluate symptoms and progress in treatment, Medication management, and Plan to discharge and continue with outpatient services  Olin Pia, NP 06/26/2023 6:28 PM

## 2023-06-26 NOTE — ED Notes (Signed)
Patient in the dayroom watching TV with other patients. Respirations are even and unlabored.  Will continue to monitor for safety.

## 2023-06-26 NOTE — Progress Notes (Signed)
Pt is awake, alert and oriented X3. Pt did not voice any complaints of pain or discomfort. No signs of acute distress noted. Administered scheduled meds per order. Pt denies current SI/HI/AVH, plan or intent. Staff will monitor for pt's safety.

## 2023-06-26 NOTE — ED Notes (Signed)
Patient resting with eyes closed in no apparent acute distress. Respirations even and unlabored. Environment secured. Safety checks in place according to facility policy.

## 2023-06-26 NOTE — ED Notes (Signed)
Patient is sleeping. Respirations equal and unlabored, skin warm and dry. No change in assessment or acuity. Routine safety checks conducted according to facility protocol. Will continue to monitor for safety.   

## 2023-06-26 NOTE — Progress Notes (Signed)
Pt was visible in the milieu and interacted with peers. No distress noted or concerns voiced. Staff will monitor for pt's safety.

## 2023-06-26 NOTE — Group Note (Signed)
Group Topic: Social Support  Group Date: 06/26/2023 Start Time: 1000 End Time: 1030 Facilitators: Priscille Kluver, NT  Department: [facility]  Number of Participants: 4  Group Focus: community group Treatment Modality:  Solution-Focused Therapy Interventions utilized were support Purpose: enhance coping skills  Name: Kristina Huffman Date of Birth: [birth year]  MR: [id number]    Level of Participation: Pt did not attend group.  Patients Problems:  Patient Active Problem List   Diagnosis Date Noted   Alcohol abuse 06/24/2023   Alcohol addiction (HCC) 04/07/2022   Obstructive sleep apnea syndrome 05/08/2020   Acute respiratory failure due to COVID-19 [facility]) 10/14/2019   ARF (acute renal failure) (HCC) 10/14/2019   Essential hypertension 10/14/2019   Controlled type 2 diabetes mellitus with hyperglycemia (HCC) 10/14/2019   Alcohol use disorder, severe, dependence (HCC) 06/06/2018   Deviated nasal septum 12/03/2017   Mixed hyperlipidemia 11/29/2017   Gastroesophageal reflux disease 10/06/2017   Chronic hepatitis C without hepatic coma (HCC) 06/04/2016   Benzodiazepine dependence (HCC) 09/21/2011   Bipolar 1 disorder, mixed, moderate (HCC) 09/21/2011   PTSD (post-traumatic stress disorder) 09/21/2011

## 2023-06-26 NOTE — Group Note (Signed)
Group Topic: Communication  Group Date: 06/26/2023 Start Time: 2016 End Time: 2117 Facilitators: Rae Lips B  Department: [facility]  Number of Participants: 3  Group Focus: acceptance Treatment Modality:  Individual Therapy Interventions utilized were story telling and support Purpose: regain self-worth  Name: Kristina Huffman Date of Birth: 11/01/1960  MR: [id number]    Level of Participation: minimal Quality of Participation: attentive, cooperative, and quiet Interactions with others: gave feedback Mood/Affect: appropriate Triggers (if applicable): her son passing Cognition: coherent/clear Progress: Gaining insight Response:  Plan: patient will be encouraged to Keep going to groups.   Patients Problems:  Patient Active Problem List   Diagnosis Date Noted   Alcohol abuse 06/24/2023   Alcohol addiction (HCC) 04/07/2022   Obstructive sleep apnea syndrome 05/08/2020   Acute respiratory failure due to COVID-19 [facility]) 10/14/2019   ARF (acute renal failure) (HCC) 10/14/2019   Essential hypertension 10/14/2019   Controlled type 2 diabetes mellitus with hyperglycemia (HCC) 10/14/2019   Alcohol use disorder, severe, dependence (HCC) 06/06/2018   Deviated nasal septum 12/03/2017   Mixed hyperlipidemia 11/29/2017   Gastroesophageal reflux disease 10/06/2017   Chronic hepatitis C without hepatic coma (HCC) 06/04/2016   Benzodiazepine dependence (HCC) 09/21/2011   Bipolar 1 disorder, mixed, moderate (HCC) 09/21/2011   PTSD (post-traumatic stress disorder) 09/21/2011

## 2023-06-27 ENCOUNTER — Encounter (HOSPITAL_COMMUNITY): Payer: Self-pay | Admitting: Psychiatry

## 2023-06-27 DIAGNOSIS — F319 Bipolar disorder, unspecified: Secondary | ICD-10-CM | POA: Diagnosis not present

## 2023-06-27 DIAGNOSIS — F419 Anxiety disorder, unspecified: Secondary | ICD-10-CM | POA: Diagnosis not present

## 2023-06-27 DIAGNOSIS — F10229 Alcohol dependence with intoxication, unspecified: Secondary | ICD-10-CM | POA: Diagnosis not present

## 2023-06-27 DIAGNOSIS — F10239 Alcohol dependence with withdrawal, unspecified: Secondary | ICD-10-CM | POA: Diagnosis not present

## 2023-06-27 LAB — TROPONIN I (HIGH SENSITIVITY)
Troponin I (High Sensitivity): 3 ng/L (ref <18)
Troponin I (High Sensitivity): 3 ng/L (ref <18)

## 2023-06-27 MED ORDER — BACLOFEN 10 MG PO TABS
10.0000 mg | ORAL_TABLET | Freq: Three times a day (TID) | ORAL | Status: DC | PRN
  Administered 2023-06-27: 10 mg via ORAL
  Filled 2023-06-27: qty 1

## 2023-06-27 MED ORDER — ASPIRIN 325 MG PO TABS: 325.0000 mg | ORAL_TABLET | Freq: Once | ORAL | Status: DC

## 2023-06-27 MED ORDER — FLUOXETINE HCL 20 MG PO CAPS
60.0000 mg | ORAL_CAPSULE | Freq: Every morning | ORAL | Status: DC
  Administered 2023-06-28 – 2023-06-29 (×2): 60 mg via ORAL
  Filled 2023-06-27: qty 3
  Filled 2023-06-27: qty 1
  Filled 2023-06-27: qty 3

## 2023-06-27 MED ORDER — QUETIAPINE FUMARATE 200 MG PO TABS
200.0000 mg | ORAL_TABLET | Freq: Every day | ORAL | Status: DC
  Administered 2023-06-27 – 2023-06-28 (×2): 200 mg via ORAL
  Filled 2023-06-27 (×2): qty 1

## 2023-06-27 MED ORDER — HYDROXYZINE HCL 25 MG PO TABS: 50.0000 mg | ORAL_TABLET | Freq: Four times a day (QID) | ORAL | Status: AC | PRN

## 2023-06-27 MED ORDER — HYDROXYZINE HCL 25 MG PO TABS: 50.0000 mg | ORAL_TABLET | Freq: Four times a day (QID) | ORAL | Status: DC | PRN

## 2023-06-27 MED ORDER — NALTREXONE HCL 50 MG PO TABS
25.0000 mg | ORAL_TABLET | Freq: Every day | ORAL | Status: DC
  Administered 2023-06-28: 25 mg via ORAL
  Filled 2023-06-27: qty 1

## 2023-06-27 NOTE — ED Notes (Signed)
 Pt was provided lunch

## 2023-06-27 NOTE — ED Notes (Signed)
Provider states EKG  was ok. States to check tropoins.Will continue to monitor for safety.

## 2023-06-27 NOTE — ED Notes (Signed)
Patient alert and oriented x 3. Denies SI/HI/AVH. Denies intent or plan to harm self or others. Routine conducted according to faculty protocol. Encourage patient to notify staff with any needs or concerns. Patient verbalized agreement and understanding. Will continue to monitor for safety. 

## 2023-06-27 NOTE — ED Notes (Addendum)
Patient is requesting medication for aniexty provider offered atarax she refused.Patient requested baclofen. Provider states to give baclofen first then if she is still nauseous offer atrax or zofran

## 2023-06-27 NOTE — ED Notes (Signed)
 Patient in the bedroom sleeping. NAD.  Respirations are even and unlabored. Will continue to monitor for safety.

## 2023-06-27 NOTE — ED Notes (Signed)
Patient  reporting chest pain provider provider is present ordered EKG and topinin. Patients oxygen is 93 percent providers states that is ok. Patient is breathing non labored Will continue to monitor for saftey.

## 2023-06-27 NOTE — ED Notes (Signed)
Patient reports nausea and some aniexty.Rn offered medication for relief but patient decline, rn advised that if she changes her mind to let rn know.

## 2023-06-27 NOTE — ED Notes (Signed)
Pt was provided dinner.

## 2023-06-27 NOTE — Group Note (Signed)
Group Topic: Relaxation  Group Date: 06/27/2023 Start Time: 1130 End Time: 1210 Facilitators: Londell Moh, NT  Department: [facility]  Number of Participants: 7  Group Focus: check in, daily focus, and relaxation Treatment Modality:  Psychoeducation Interventions utilized were leisure development and patient education Purpose: increase insight  Name: Kristina Huffman Date of Birth: 08/29/1961  MR: [id number]    Level of Participation: active Quality of Participation: attentive Interactions with others: gave feedback Mood/Affect: appropriate Triggers (if applicable): n/a Cognition: coherent/clear Progress: Gaining insight Response: Pt was active during group and was able to express her goals for the day Plan: patient will be encouraged to continue to attend groups  Patients Problems:  Patient Active Problem List   Diagnosis Date Noted   Alcohol abuse 06/24/2023   Alcohol addiction (HCC) 04/07/2022   Obstructive sleep apnea syndrome 05/08/2020   Acute respiratory failure due to COVID-19 (HCC) 10/14/2019   ARF (acute renal failure) (HCC) 10/14/2019   Essential hypertension 10/14/2019   Controlled type 2 diabetes mellitus with hyperglycemia (HCC) 10/14/2019   Alcohol use disorder, severe, dependence (HCC) 06/06/2018   Deviated nasal septum 12/03/2017   Mixed hyperlipidemia 11/29/2017   Gastroesophageal reflux disease 10/06/2017   Chronic hepatitis C without hepatic coma (HCC) 06/04/2016   Benzodiazepine dependence (HCC) 09/21/2011   Bipolar 1 disorder, mixed, moderate (HCC) 09/21/2011   PTSD (post-traumatic stress disorder) 09/21/2011

## 2023-06-27 NOTE — ED Provider Notes (Signed)
Patient c/o chest pain earlier today. She was evaluated by Princess Bruins, DO and troponin ordered. Patient reevaluated by this NP; she denies CP

## 2023-06-27 NOTE — ED Notes (Signed)
Troponin resulted and provider aware. Patient denies chest pain or SOB at this time. Second Troponin was be collected at 2030 and patient made aware though previous troponin results was unremarkable.

## 2023-06-27 NOTE — ED Notes (Signed)
Patient is outside with staff. Environment is secured. Will continue to monitor for safety.

## 2023-06-27 NOTE — ED Notes (Signed)
Provider requested ciwa be done due to the patient stating that she is anxious.Ciwa was redone at it was 7 rn notified provider

## 2023-06-27 NOTE — Group Note (Signed)
Group Topic: Relapse and Recovery  Group Date: 06/27/2023 Start Time: 2000 End Time: 2100 Facilitators: Rae Lips B  Department: [facility]  Number of Participants: 6  Group Focus: activities of daily living skills, chemical dependency education, and chemical dependency issues Treatment Modality:  Leisure Development and Spiritual Interventions utilized were problem solving, reality testing, story telling, and support Purpose: express feelings  Name: Geralene Afshar Date of Birth: [birth year]  MR: [id number]    Level of Participation: PT DID NOT ATTEND GROUP Quality of Participation: withdrawn Interactions with others: gave feedback Mood/Affect: appropriate Triggers (if applicable): NA Cognition: coherent/clear Progress: None Response: NA Plan: patient will be encouraged to go to groups  Patients Problems:  Patient Active Problem List   Diagnosis Date Noted   Alcohol abuse 06/24/2023   Alcohol addiction (HCC) 04/07/2022   Obstructive sleep apnea syndrome 05/08/2020   Acute respiratory failure due to COVID-19 (HCC) 10/14/2019   ARF (acute renal failure) (HCC) 10/14/2019   Essential hypertension 10/14/2019   Controlled type 2 diabetes mellitus with hyperglycemia (HCC) 10/14/2019   Alcohol use disorder, severe, dependence (HCC) 06/06/2018   Deviated nasal septum 12/03/2017   Mixed hyperlipidemia 11/29/2017   Gastroesophageal reflux disease 10/06/2017   Chronic hepatitis C without hepatic coma (HCC) 06/04/2016   Benzodiazepine dependence (HCC) 09/21/2011   Bipolar 1 disorder, mixed, moderate (HCC) 09/21/2011   PTSD (post-traumatic stress disorder) 09/21/2011

## 2023-06-27 NOTE — ED Notes (Signed)
Patient states baclofen helped aniexty and nausea.

## 2023-06-27 NOTE — ED Notes (Signed)
Patient is denying any dizziness,radiating pain dow the arm ,or stomach pain . Will continue to monitor for safety

## 2023-06-27 NOTE — ED Notes (Signed)
Patient in milieu. Environment is secured. Will continue to monitor for safety. 

## 2023-06-27 NOTE — ED Notes (Signed)
Patient refused baclofen and pantoprzole rn notified provider.

## 2023-06-27 NOTE — ED Notes (Signed)
Pt was provided breakfast.

## 2023-06-27 NOTE — ED Notes (Signed)
Rn notified on coming nurse of patients tropinin labs and charge nurse on bhuc

## 2023-06-27 NOTE — Group Note (Signed)
Group Topic: Communication  Group Date: 06/27/2023 Start Time: 1104 End Time: 1126 Facilitators: Merrie Roof, RN  Department: [facility]  Number of Participants: 6  Group Focus: communication Treatment Modality:  Patient-Centered Therapy Interventions utilized were group exercise Purpose: express feelings  Name: Kristina Huffman Date of Birth: [birth year]  MR: [id number]    Level of Participation:  Quality of Participation:  Interactions with others:  Mood/Affect:  Triggers (if applicable):  Cognition:  Progress:  Response:  Plan: Patient was going to participate with group but provider kept her the entire time.  Patients Problems:  Patient Active Problem List   Diagnosis Date Noted   Alcohol abuse 06/24/2023   Alcohol addiction (HCC) 04/07/2022   Obstructive sleep apnea syndrome 05/08/2020   Acute respiratory failure due to COVID-19 [facility]) 10/14/2019   ARF (acute renal failure) (HCC) 10/14/2019   Essential hypertension 10/14/2019   Controlled type 2 diabetes mellitus with hyperglycemia (HCC) 10/14/2019   Alcohol use disorder, severe, dependence (HCC) 06/06/2018   Deviated nasal septum 12/03/2017   Mixed hyperlipidemia 11/29/2017   Gastroesophageal reflux disease 10/06/2017   Chronic hepatitis C without hepatic coma (HCC) 06/04/2016   Benzodiazepine dependence (HCC) 09/21/2011   Bipolar 1 disorder, mixed, moderate (HCC) 09/21/2011   PTSD (post-traumatic stress disorder) 09/21/2011

## 2023-06-27 NOTE — ED Provider Notes (Signed)
Behavioral Health Progress Note  Date and Time: 06/27/2023 5:45 PM Name: Kristina Huffman MRN:  [id number]  Subjective:  Kristina Huffman is a 62 y.o. female, with PMH of AUD, benzodiazepine use d/o, PTSD, treated hepC, HTN, DM2-diet controlled, OSA, HLD, who presented to [facility] (06/24/2023) then admitted to [facility] EtOH detox Currently in CD-IOP  Still experiencing some withdrawal sxs - anxiety, sweats. Also feels sedated due to how her meds were restarted - at home seroquel 200 mg at bedtime, but was restarted as 100 mg BID.  Mood anxious.  Sleep and appetite are improving.  She has not had med changes in month, amenable to increasing prozac and gabapentin and starting naltrexone after discussing the r/b/s. Plans to return to CD-IOP.   Outpatient psychiatrist: Maggie Schwalbe  She started drinking 1-2 bottles of wine about 1-2 weeks ago after breaking up with boyfriend of 2 years.   Review of Systems  Constitutional:  Positive for diaphoresis and malaise/fatigue.  Respiratory:  Negative for shortness of breath.   Cardiovascular:  Negative for chest pain.  Gastrointestinal:  Negative for abdominal pain, nausea and vomiting.  Neurological:  Negative for dizziness, tremors, weakness and headaches.  Psychiatric/Behavioral:  The patient is nervous/anxious.      Diagnosis:  Final diagnoses:  Alcohol abuse  Anxious appearance  Alcohol use disorder, severe, dependence (HCC)    Past Psychiatric History: per above Past Medical History: per above Family History: see H&P Family Psychiatric  History: See H&P Social History: See H&P  Total Time spent with patient: 30 minutes  Additional Social History:                         Current Medications:  Current Facility-Administered Medications  Medication Dose Route Frequency Provider Last Rate Last Admin   acetaminophen (TYLENOL) tablet 650 mg  650 mg Oral Q6H PRN Sindy Guadeloupe, NP       albuterol (VENTOLIN HFA) 108 (90 Base) MCG/ACT inhaler  1 puff  1 puff Inhalation Q6H PRN Nelly Rout, MD       alum & mag hydroxide-simeth (MAALOX/MYLANTA) 200-200-20 MG/5ML suspension 30 mL  30 mL Oral Q4H PRN Sindy Guadeloupe, NP       atorvastatin (LIPITOR) tablet 40 mg  40 mg Oral QHS Sindy Guadeloupe, NP   40 mg at 06/26/23 2123   baclofen (LIORESAL) tablet 10 mg  10 mg Oral TID PRN Princess Bruins, DO   10 mg at 06/27/23 1410   cholecalciferol (VITAMIN D3) 25 MCG (1000 UNIT) tablet 2,000 Units  2,000 Units Oral Daily Nelly Rout, MD   2,000 Units at 06/27/23 0932   colchicine tablet 0.6 mg  0.6 mg Oral Daily PRN Sindy Guadeloupe, NP       Melene Muller ON 06/28/2023] FLUoxetine (PROZAC) capsule 60 mg  60 mg Oral q AM Princess Bruins, DO       gabapentin (NEURONTIN) capsule 300 mg  300 mg Oral TID Carrion-Carrero, Karle Starch, MD   300 mg at 06/27/23 1534   hydrochlorothiazide (HYDRODIURIL) tablet 25 mg  25 mg Oral Daily Sindy Guadeloupe, NP   25 mg at 06/27/23 0932   hydrOXYzine (ATARAX) tablet 50 mg  50 mg Oral Q6H PRN Princess Bruins, DO       loperamide (IMODIUM) capsule 2-4 mg  2-4 mg Oral PRN Sindy Guadeloupe, NP       LORazepam (ATIVAN) tablet 1 mg  1 mg Oral Q6H PRN Sindy Guadeloupe, NP       Melene Muller  ON 06/28/2023] LORazepam (ATIVAN) tablet 1 mg  1 mg Oral Daily Sindy Guadeloupe, NP       OLANZapine zydis (ZYPREXA) disintegrating tablet 10 mg  10 mg Oral Q8H PRN Sindy Guadeloupe, NP       And   LORazepam (ATIVAN) tablet 1 mg  1 mg Oral PRN Sindy Guadeloupe, NP       And   ziprasidone (GEODON) injection 20 mg  20 mg Intramuscular PRN Sindy Guadeloupe, NP       magnesium hydroxide (MILK OF MAGNESIA) suspension 30 mL  30 mL Oral Daily PRN Sindy Guadeloupe, NP       multivitamin with minerals tablet 1 tablet  1 tablet Oral Daily Sindy Guadeloupe, NP   1 tablet at 06/27/23 6644   naltrexone (DEPADE) tablet 25 mg  25 mg Oral QHS Princess Bruins, DO       ondansetron (ZOFRAN-ODT) disintegrating tablet 4 mg  4 mg Oral Q6H PRN Sindy Guadeloupe, NP       pantoprazole (PROTONIX) EC tablet 40 mg  40  mg Oral Daily Sindy Guadeloupe, NP       QUEtiapine (SEROQUEL) tablet 200 mg  200 mg Oral QHS Princess Bruins, DO       thiamine (VITAMIN B1) injection 100 mg  100 mg Intramuscular Once Sindy Guadeloupe, NP       thiamine (VITAMIN B1) tablet 100 mg  100 mg Oral Daily Sindy Guadeloupe, NP   100 mg at 06/27/23 0932   traZODone (DESYREL) tablet 100 mg  100 mg Oral Dorthey Sawyer, NP   100 mg at 06/26/23 2123   Current Outpatient Medications  Medication Sig Dispense Refill   ACCU-CHEK GUIDE test strip test ONCE EVERY DAY     Accu-Chek Softclix Lancets lancets daily.     albuterol (VENTOLIN HFA) 108 (90 Base) MCG/ACT inhaler Inhale 2 puffs into the lungs every 6 (six) hours as needed for wheezing or shortness of breath. 8 g 0   atorvastatin (LIPITOR) 40 MG tablet Take 1 tablet (40 mg total) by mouth at bedtime. 30 tablet 0   baclofen (LIORESAL) 10 MG tablet Take 1 tablet (10 mg total) by mouth 3 (three) times daily. (Patient not taking: Reported on 06/24/2023) 90 tablet 1   Blood Glucose Monitoring Suppl (ACCU-CHEK GUIDE ME) w/Device KIT daily. as directed     Cholecalciferol (VITAMIN D3) 50 MCG (2000 UT) TABS Take 2,000 mcg by mouth daily. 30 tablet 0   colchicine 0.6 MG tablet Take 0.6 mg by mouth daily as needed (For gout).     DENTA 5000 PLUS 1.1 % CREA dental cream Take by mouth 2 (two) times daily.     FLUoxetine (PROZAC) 40 MG capsule Take 40 mg by mouth daily.     gabapentin (NEURONTIN) 600 MG tablet Take 600 mg by mouth 3 (three) times daily.     hydrochlorothiazide (HYDRODIURIL) 25 MG tablet Take 1 tablet (25 mg total) by mouth daily. 30 tablet 0   Omega-3 Fatty Acids (FISH OIL PO) Take 1 capsule by mouth at bedtime.     ondansetron (ZOFRAN) 4 MG tablet Take 4 mg by mouth every 6 (six) hours as needed for nausea or vomiting.     pantoprazole (PROTONIX) 40 MG tablet Take 1 tablet (40 mg total) by mouth daily. (Patient taking differently: Take 40 mg by mouth daily as needed (For heartburn or acid  reflux).) 30 tablet 0   QUEtiapine (SEROQUEL) 100 MG tablet Take 100 mg by mouth 2 (  two) times daily.     QUEtiapine (SEROQUEL) 200 MG tablet Take 1 tablet (200 mg total) by mouth at bedtime. 30 tablet 0   traZODone (DESYREL) 100 MG tablet Take 1 tablet (100 mg total) by mouth at bedtime. 30 tablet 0    Labs  Lab Results:  Admission on 06/24/2023, Discharged on 06/24/2023  Component Date Value Ref Range Status   WBC 06/24/2023 4.8  4.0 - 10.5 K/uL Final   RBC 06/24/2023 4.65  3.87 - 5.11 MIL/uL Final   Hemoglobin 06/24/2023 14.2  12.0 - 15.0 g/dL Final   HCT 40/98/1191 42.4  36.0 - 46.0 % Final   MCV 06/24/2023 91.2  80.0 - 100.0 fL Final   MCH 06/24/2023 30.5  26.0 - 34.0 pg Final   MCHC 06/24/2023 33.5  30.0 - 36.0 g/dL Final   RDW 47/82/9562 13.2  11.5 - 15.5 % Final   Platelets 06/24/2023 240  150 - 400 K/uL Final   nRBC 06/24/2023 0.0  0.0 - 0.2 % Final   Neutrophils Relative % 06/24/2023 53  % Final   Neutro Abs 06/24/2023 2.6  1.7 - 7.7 K/uL Final   Lymphocytes Relative 06/24/2023 37  % Final   Lymphs Abs 06/24/2023 1.7  0.7 - 4.0 K/uL Final   Monocytes Relative 06/24/2023 7  % Final   Monocytes Absolute 06/24/2023 0.3  0.1 - 1.0 K/uL Final   Eosinophils Relative 06/24/2023 2  % Final   Eosinophils Absolute 06/24/2023 0.1  0.0 - 0.5 K/uL Final   Basophils Relative 06/24/2023 1  % Final   Basophils Absolute 06/24/2023 0.0  0.0 - 0.1 K/uL Final   Immature Granulocytes 06/24/2023 0  % Final   Abs Immature Granulocytes 06/24/2023 0.01  0.00 - 0.07 K/uL Final   Performed at [facility], [address] [place], [place] [address]   Sodium 06/24/2023 137  135 - 145 mmol/L Final   Potassium 06/24/2023 3.6  3.5 - 5.1 mmol/L Final   Chloride 06/24/2023 105  98 - 111 mmol/L Final   CO2 06/24/2023 21 (L)  22 - 32 mmol/L Final   Glucose, Bld 06/24/2023 103 (H)  70 - 99 mg/dL Final   Glucose reference range applies only to samples taken after fasting for at least 8 hours.   BUN  06/24/2023 8  6 - 20 mg/dL Final   Creatinine, Ser 06/24/2023 0.76  0.44 - 1.00 mg/dL Final   Calcium 57/84/6962 9.0  8.9 - 10.3 mg/dL Final   Total Protein 95/28/4132 7.0  6.5 - 8.1 g/dL Final   Albumin 44/09/270 3.9  3.5 - 5.0 g/dL Final   AST 53/66/4403 52 (H)  15 - 41 U/L Final   ALT 06/24/2023 53 (H)  0 - 44 U/L Final   Alkaline Phosphatase 06/24/2023 79  38 - 126 U/L Final   Total Bilirubin 06/24/2023 1.1  0.3 - 1.2 mg/dL Final   GFR, Estimated 06/24/2023 >60  >60 mL/min Final   Comment: (NOTE) Calculated using the CKD-EPI Creatinine Equation (2021)    Anion gap 06/24/2023 11  5 - 15 Final   Performed at [facility] Lab, [address], [place], [place] [address]   Hgb A1c MFr Bld 06/24/2023 6.1 (H)  4.8 - 5.6 % Final   Comment: (NOTE) Pre diabetes:          5.7%-6.4%  Diabetes:              >6.4%  Glycemic control for   <  7.0% adults with diabetes    Mean Plasma Glucose 06/24/2023 128.37  mg/dL Final   Performed at [facility] Lab, [address], [place], [place] [address]   Magnesium 06/24/2023 2.0  1.7 - 2.4 mg/dL Final   Performed at [facility], [address], [place], [place] [address]   Alcohol, Ethyl (B) 06/24/2023 180 (H)  <10 mg/dL Final   Comment: (NOTE) Lowest detectable limit for serum alcohol is 10 mg/dL.  For medical purposes only. Performed at [facility], [address] [place], [place] [address]    Cholesterol 06/24/2023 237 (H)  0 - 200 mg/dL Final   Triglycerides 95/28/4132 328 (H)  <150 mg/dL Final   HDL 44/09/270 52  >40 mg/dL Final   Total CHOL/HDL Ratio 06/24/2023 4.6  RATIO Final   VLDL 06/24/2023 66 (H)  0 - 40 mg/dL Final   LDL Cholesterol 06/24/2023 119 (H)  0 - 99 mg/dL Final   Comment:        Total Cholesterol/HDL:CHD Risk Coronary Heart Disease Risk Table                     Men   Women  1/2 Average Risk   3.4   3.3  Average Risk       5.0   4.4  2 X Average Risk   9.6   7.1  3 X Average Risk  23.4   11.0         Use the calculated Patient Ratio above and the CHD Risk Table to determine the patient's CHD Risk.        ATP III CLASSIFICATION (LDL):  <100     mg/dL   Optimal  536-644  mg/dL   Near or Above                    Optimal  130-159  mg/dL   Borderline  034-742  mg/dL   High  >595     mg/dL   Very High Performed at [facility], [address], [place], [place] [address]    TSH 06/24/2023 2.250  0.350 - 4.500 uIU/mL Final   Comment: Performed by a 3rd Generation assay with a functional sensitivity of <=0.01 uIU/mL. Performed at [facility], [address] [place], [place] [address]    RPR Ser Ql 06/24/2023 NON REACTIVE  NON REACTIVE Final   Performed at [facility], [address], [place], [place] [address]   Color, Urine 06/24/2023 STRAW (A)  YELLOW Final   APPearance 06/24/2023 CLEAR  CLEAR Final   Specific Gravity, Urine 06/24/2023 1.002 (L)  1.005 - 1.030 Final   pH 06/24/2023 6.0  5.0 - 8.0 Final   Glucose, UA 06/24/2023 NEGATIVE  NEGATIVE mg/dL Final   Hgb urine dipstick 06/24/2023 NEGATIVE  NEGATIVE Final   Bilirubin Urine 06/24/2023 NEGATIVE  NEGATIVE Final   Ketones, ur 06/24/2023 NEGATIVE  NEGATIVE mg/dL Final   Protein, ur 41/66/0630 NEGATIVE  NEGATIVE mg/dL Final   Nitrite 16/09/930 NEGATIVE  NEGATIVE Final   Leukocytes,Ua 06/24/2023 NEGATIVE  NEGATIVE Final   Performed at [facility], [address], [place], [place] [address]   POC Amphetamine UR 06/24/2023 None Detected  NONE DETECTED (Cut Off Level 1000 ng/mL) Final   POC Secobarbital (BAR) 06/24/2023 None Detected  NONE DETECTED (Cut Off Level 300 ng/mL) Final   POC Buprenorphine (BUP) 06/24/2023 None Detected  NONE DETECTED (  Cut Off Level 10 ng/mL) Final   POC Oxazepam (BZO) 06/24/2023 None Detected  NONE DETECTED (Cut Off Level 300 ng/mL) Final   POC Cocaine UR 06/24/2023 None Detected  NONE DETECTED (Cut Off Level 300 ng/mL) Final   POC Methamphetamine UR 06/24/2023 None  Detected  NONE DETECTED (Cut Off Level 1000 ng/mL) Final   POC Morphine 06/24/2023 None Detected  NONE DETECTED (Cut Off Level 300 ng/mL) Final   POC Methadone UR 06/24/2023 None Detected  NONE DETECTED (Cut Off Level 300 ng/mL) Final   POC Oxycodone UR 06/24/2023 None Detected  NONE DETECTED (Cut Off Level 100 ng/mL) Final   POC Marijuana UR 06/24/2023 None Detected  NONE DETECTED (Cut Off Level 50 ng/mL) Final  Admission on 03/25/2023, Discharged on 03/26/2023  Component Date Value Ref Range Status   WBC 03/25/2023 6.0  4.0 - 10.5 K/uL Final   RBC 03/25/2023 4.95  3.87 - 5.11 MIL/uL Final   Hemoglobin 03/25/2023 15.6 (H)  12.0 - 15.0 g/dL Final   HCT 81/19/1478 46.1 (H)  36.0 - 46.0 % Final   MCV 03/25/2023 93.1  80.0 - 100.0 fL Final   MCH 03/25/2023 31.5  26.0 - 34.0 pg Final   MCHC 03/25/2023 33.8  30.0 - 36.0 g/dL Final   RDW 29/56/2130 12.7  11.5 - 15.5 % Final   Platelets 03/25/2023 239  150 - 400 K/uL Final   nRBC 03/25/2023 0.0  0.0 - 0.2 % Final   Neutrophils Relative % 03/25/2023 51  % Final   Neutro Abs 03/25/2023 3.1  1.7 - 7.7 K/uL Final   Lymphocytes Relative 03/25/2023 40  % Final   Lymphs Abs 03/25/2023 2.4  0.7 - 4.0 K/uL Final   Monocytes Relative 03/25/2023 6  % Final   Monocytes Absolute 03/25/2023 0.3  0.1 - 1.0 K/uL Final   Eosinophils Relative 03/25/2023 2  % Final   Eosinophils Absolute 03/25/2023 0.1  0.0 - 0.5 K/uL Final   Basophils Relative 03/25/2023 1  % Final   Basophils Absolute 03/25/2023 0.0  0.0 - 0.1 K/uL Final   Immature Granulocytes 03/25/2023 0  % Final   Abs Immature Granulocytes 03/25/2023 0.02  0.00 - 0.07 K/uL Final   Performed at [facility], [address] [place], [place] [address]   Sodium 03/25/2023 136  135 - 145 mmol/L Final   Potassium 03/25/2023 4.0  3.5 - 5.1 mmol/L Final   Chloride 03/25/2023 101  98 - 111 mmol/L Final   CO2 03/25/2023 22  22 - 32 mmol/L Final   Glucose, Bld 03/25/2023 112 (H)  70 - 99 mg/dL Final    Glucose reference range applies only to samples taken after fasting for at least 8 hours.   BUN 03/25/2023 11  6 - 20 mg/dL Final   Creatinine, Ser 03/25/2023 0.73  0.44 - 1.00 mg/dL Final   Calcium 46/96/2952 9.5  8.9 - 10.3 mg/dL Final   Total Protein 84/13/2440 7.2  6.5 - 8.1 g/dL Final   Albumin 07/19/2535 4.2  3.5 - 5.0 g/dL Final   AST 64/40/3474 51 (H)  15 - 41 U/L Final   ALT 03/25/2023 46 (H)  0 - 44 U/L Final   Alkaline Phosphatase 03/25/2023 86  38 - 126 U/L Final   Total Bilirubin 03/25/2023 1.1  0.3 - 1.2 mg/dL Final   GFR, Estimated 03/25/2023 >60  >60 mL/min Final   Comment: (NOTE) Calculated using the CKD-EPI Creatinine Equation (2021)    Anion gap  03/25/2023 13  5 - 15 Final   Performed at [facility], [address], [place], [place] [address]   Hgb A1c MFr Bld 03/25/2023 5.7 (H)  4.8 - 5.6 % Final   Comment: (NOTE) Pre diabetes:          5.7%-6.4%  Diabetes:              >6.4%  Glycemic control for   <7.0% adults with diabetes    Mean Plasma Glucose 03/25/2023 116.89  mg/dL Final   Performed at [facility], [address], [place], [place] [address]   Alcohol, Ethyl (B) 03/25/2023 211 (H)  <10 mg/dL Final   Comment: (NOTE) Lowest detectable limit for serum alcohol is 10 mg/dL.  For medical purposes only. Performed at [facility] Lab, [address], [place], [place] [address]    Cholesterol 03/25/2023 180  0 - 200 mg/dL Final   Triglycerides 91/47/8295 416 (H)  <150 mg/dL Final   HDL 62/13/0865 51  >40 mg/dL Final   Total CHOL/HDL Ratio 03/25/2023 3.5  RATIO Final   VLDL 03/25/2023 UNABLE TO CALCULATE IF TRIGLYCERIDE OVER 400 mg/dL  0 - 40 mg/dL Final   LDL Cholesterol 03/25/2023 UNABLE TO CALCULATE IF TRIGLYCERIDE OVER 400 mg/dL  0 - 99 mg/dL Final   Comment:        Total Cholesterol/HDL:CHD Risk Coronary Heart Disease Risk Table                     Men   Women  1/2 Average Risk   3.4   3.3  Average Risk       5.0   4.4  2 X Average  Risk   9.6   7.1  3 X Average Risk  23.4   11.0        Use the calculated Patient Ratio above and the CHD Risk Table to determine the patient's CHD Risk.        ATP III CLASSIFICATION (LDL):  <100     mg/dL   Optimal  784-696  mg/dL   Near or Above                    Optimal  130-159  mg/dL   Borderline  295-284  mg/dL   High  >132     mg/dL   Very High Performed at [facility], [address] [place], [place] [address]    TSH 03/25/2023 3.277  0.350 - 4.500 uIU/mL Final   Comment: Performed by a 3rd Generation assay with a functional sensitivity of <=0.01 uIU/mL. Performed at [facility], [address] [place], [place] [address]    Preg Test, Ur 03/25/2023 Negative  Negative Final   POC Amphetamine UR 03/25/2023 None Detected  NONE DETECTED (Cut Off Level 1000 ng/mL) Final   POC Secobarbital (BAR) 03/25/2023 None Detected  NONE DETECTED (Cut Off Level 300 ng/mL) Final   POC Buprenorphine (BUP) 03/25/2023 None Detected  NONE DETECTED (Cut Off Level 10 ng/mL) Final   POC Oxazepam (BZO) 03/25/2023 None Detected  NONE DETECTED (Cut Off Level 300 ng/mL) Final   POC Cocaine UR 03/25/2023 None Detected  NONE DETECTED (Cut Off Level 300 ng/mL) Final   POC Methamphetamine UR 03/25/2023 None Detected  NONE DETECTED (Cut Off Level 1000 ng/mL) Final   POC Morphine 03/25/2023 None Detected  NONE DETECTED (Cut Off Level 300 ng/mL) Final   POC Methadone UR  03/25/2023 None Detected  NONE DETECTED (Cut Off Level 300 ng/mL) Final   POC Oxycodone UR 03/25/2023 None Detected  NONE DETECTED (Cut Off Level 100 ng/mL) Final   POC Marijuana UR 03/25/2023 None Detected  NONE DETECTED (Cut Off Level 50 ng/mL) Final   Preg Test, Ur 03/25/2023 NEGATIVE  NEGATIVE Final   Comment:        THE SENSITIVITY OF THIS METHODOLOGY IS >24 mIU/mL    Direct LDL 03/25/2023 74  0 - 99 mg/dL Final   Performed at [facility], [address], [place], [place] [address]    Blood Alcohol level:   Lab Results  Component Value Date   ETH 180 (H) 06/24/2023   ETH 211 (H) 03/25/2023    Metabolic Disorder Labs: Lab Results  Component Value Date   HGBA1C 6.1 (H) 06/24/2023   MPG 128.37 06/24/2023   MPG 116.89 03/25/2023   No results found for: "PROLACTIN" Lab Results  Component Value Date   CHOL 237 (H) 06/24/2023   TRIG 328 (H) 06/24/2023   HDL 52 06/24/2023   CHOLHDL 4.6 06/24/2023   VLDL 66 (H) 06/24/2023   LDLCALC 119 (H) 06/24/2023   LDLCALC UNABLE TO CALCULATE IF TRIGLYCERIDE OVER 400 mg/dL 60/45/4098    Therapeutic Lab Levels: Lab Results  Component Value Date   LITHIUM <0.25 (L) 07/03/2013   LITHIUM <0.25 (L) 11/11/2011   No results found for: "VALPROATE" No results found for: "CBMZ"  Physical Findings   AIMS    Flowsheet Row [facility]  AIMS Total Score 0      AUDIT    Flowsheet Row Ad[facility] Ad[facility] (Discharged) [facility] ED to Hosp-Admission (Discharged) [facility]  Alcohol Use Disorder Identification Test Final Score (AUDIT) 33 23 33      GAD-7    Flowsheet Row Counselor from 06/12/2023 in [facility] Office Visit from 12/25/2022 in [facility] for [facility] Office Visit from 11/12/2020 in Center for [employer] at [facility] Office Visit from 03/18/2017 in [facility]  Total GAD-7 Score 14 21 0 7      PHQ2-9    Flowsheet Row ED from 06/24/2023 in [facility] Counselor from 06/12/2023 in [facility] Counselor from 01/08/2023 in [facility] Visit from 12/25/2022 in  [facility] ED from 04/06/2022 in [facility]  PHQ-2 Total Score 2 3 4 5 1   PHQ-9 Total Score 6 7 13 23 17       Flowsheet Row ED from 06/24/2023 in [facility] Most recent reading at 06/24/2023 11:36 PM ED from 06/24/2023 in [facility] Most recent reading at 06/24/2023  4:13 PM ED from 06/09/2023 in [facility] [place] Most recent reading at 06/09/2023  5:46 PM  C-SSRS RISK CATEGORY No Risk No Risk No Risk        Musculoskeletal  Strength & Muscle Tone: within normal limited Gait & Station: normal  Patient leans: NA   Psychiatric Specialty Exam  Presentation  General Appearance:  Appropriate for Environment   Eye Contact: Good  Speech: Clear and Coherent   Speech Volume: Normal   Handedness: -- (Not assessed)    Mood and Affect  Mood: -- (calm)   Affect: Depressed    Thought Process  Thought Processes: Coherent   Descriptions of Associations:Intact   Orientation:Full (Time, Place and Person)   Thought Content:Logical   Diagnosis of Schizophrenia or Schizoaffective disorder in past: No   Duration of Psychotic Symptoms: No data recorded   Hallucinations:Hallucinations: None   Ideas of Reference:None   Suicidal Thoughts:Suicidal Thoughts: No   Homicidal Thoughts:Homicidal Thoughts: No    Sensorium  Memory: Immediate Good; Recent Good; Remote Good   Judgment: Fair   Insight: Fair    Chartered certified accountant: Fair   Attention Span: Fair   Recall: [employer] of Knowledge: Fair   Language: Fair    Psychomotor Activity  Psychomotor Activity: Psychomotor Activity: Normal    Assets  Assets: Communication Skills; Desire for Improvement; Social Support; Physical Health; Housing    Sleep  Sleep: Sleep: Fair Number of Hours of Sleep:  5    Nutritional Assessment (For OBS and FBC admissions only) Has the patient had a decrease in food intake/or appetite?: No Does the patient have dental problems?: No Does the patient have eating habits or behaviors that may be indicators of an eating disorder including binging or inducing vomiting?: No Has the patient recently lost weight without trying?: 0 Has the patient been eating poorly because of a decreased appetite?: 0 Malnutrition Screening Tool Score: 0     Physical Exam  Physical Exam Vitals and nursing note reviewed.  Constitutional:      General: She is not in acute distress.    Appearance: She is not ill-appearing, toxic-appearing or diaphoretic.  HENT:     Head: Normocephalic and atraumatic.  Pulmonary:     Effort: Pulmonary effort is normal. No respiratory distress.  Neurological:     General: No focal deficit present.     Mental Status: She is alert and oriented to person, place, and time.     Gait: Gait normal.    Blood pressure 124/86, pulse 97, temperature 97.9 F (36.6 C), temperature source Oral, resp. rate 18, last menstrual period 08/02/2013, SpO2 95%. There is no height or weight on file to calculate BMI.  Treatment Plan Summary: Daily contact with patient to assess and evaluate symptoms and progress in treatment and Medication management  AUD CIWA + PRN per protocol Ativan taper (ends 10/6) Continue home Baclofen 10 mg TID PRN INCREASED home gabapentin 300 mg at bedtime to 300 mg BID STARTED naltrexone 25 mg at bedtime, plans to increase to 50 mg if well tolerated  SIMD  PTSD INCREASED home prozac 40 mg daily to 60 mg daily Continued home trazodone 100 mg at bedtime CHANGED home seroquel 100 mg BID to 200 mg at bedtime  Gabapentin per above  Chest pain Sxs chest pain, nausea, diaphoresis.  She has risk factors of ACS, r/o now, will send to ED if indicated.  REPEAT EKG F/u high sensitivity troponins   HLD - lipitor 40 mg at  bedtime HTN - hydrochlorothiazide 25 mg daily OSA GERD - protonix Treated hep C, elevated AST/ALT Gout  Princess Bruins, DO Psych Resident, PGY-3 06/27/2023 5:45 PM

## 2023-06-27 NOTE — ED Notes (Signed)
Patient  sleeping in no acute stress. RR even and unlabored .Environment secured .Will continue to monitor for safely. 

## 2023-06-28 DIAGNOSIS — F10229 Alcohol dependence with intoxication, unspecified: Secondary | ICD-10-CM | POA: Diagnosis not present

## 2023-06-28 DIAGNOSIS — F10239 Alcohol dependence with withdrawal, unspecified: Secondary | ICD-10-CM | POA: Diagnosis not present

## 2023-06-28 DIAGNOSIS — F419 Anxiety disorder, unspecified: Secondary | ICD-10-CM | POA: Diagnosis not present

## 2023-06-28 DIAGNOSIS — F319 Bipolar disorder, unspecified: Secondary | ICD-10-CM | POA: Diagnosis not present

## 2023-06-28 MED ORDER — NALTREXONE HCL 50 MG PO TABS
50.0000 mg | ORAL_TABLET | Freq: Every day | ORAL | Status: DC
  Administered 2023-06-28: 50 mg via ORAL
  Filled 2023-06-28: qty 1

## 2023-06-28 MED ORDER — ONDANSETRON 4 MG PO TBDP: 4.0000 mg | ORAL_TABLET | Freq: Four times a day (QID) | ORAL | Status: DC | PRN

## 2023-06-28 MED ORDER — HYDROXYZINE HCL 25 MG PO TABS: 50.0000 mg | ORAL_TABLET | Freq: Four times a day (QID) | ORAL | Status: AC | PRN

## 2023-06-28 MED ORDER — BACLOFEN 10 MG PO TABS: 10.0000 mg | ORAL_TABLET | Freq: Three times a day (TID) | ORAL | Status: DC | PRN

## 2023-06-28 MED ORDER — PANTOPRAZOLE SODIUM 40 MG PO TBEC: 40.0000 mg | DELAYED_RELEASE_TABLET | Freq: Every day | ORAL | Status: DC | PRN

## 2023-06-28 NOTE — ED Notes (Signed)
Patient got into an argument with another female patient over the fruit cups, the other patient did not like that Leily was touching all the fruit cups. Akia stated she was just looking at them to see which one she wanted and an argument ensued between them as a result.

## 2023-06-28 NOTE — Group Note (Signed)
Group Topic: Communication  Group Date: 06/28/2023 Start Time: 2000 End Time: 2100 Facilitators: Rae Lips B  Department: [facility]  Number of Participants: 5  Group Focus: acceptance, activities of daily living skills, chemical dependency education, chemical dependency issues, clarity of thought, co-dependency, communication, community group, and coping skills Treatment Modality:  Leisure Development Interventions utilized were leisure development Purpose: express feelings  Name: Kristina Huffman Date of Birth: [birth year]  MR: [id number]    Level of Participation: minimal Quality of Participation: cooperative and quiet Interactions with others: gave feedback Mood/Affect: appropriate Triggers (if applicable): NA Cognition: coherent/clear Progress: Gaining insight Response: NA Plan: patient will be encouraged to keep going to groups.   Patients Problems:  Patient Active Problem List   Diagnosis Date Noted   Alcohol abuse 06/24/2023   Alcohol addiction (HCC) 04/07/2022   Obstructive sleep apnea syndrome 05/08/2020   Acute respiratory failure due to COVID-19 [facility]) 10/14/2019   ARF (acute renal failure) (HCC) 10/14/2019   Essential hypertension 10/14/2019   Controlled type 2 diabetes mellitus with hyperglycemia (HCC) 10/14/2019   Alcohol use disorder, severe, dependence (HCC) 06/06/2018   Deviated nasal septum 12/03/2017   Mixed hyperlipidemia 11/29/2017   Gastroesophageal reflux disease 10/06/2017   Chronic hepatitis C without hepatic coma (HCC) 06/04/2016   Benzodiazepine dependence (HCC) 09/21/2011   Bipolar 1 disorder, mixed, moderate (HCC) 09/21/2011   PTSD (post-traumatic stress disorder) 09/21/2011

## 2023-06-28 NOTE — Group Note (Signed)
Group Topic: Overcoming Obstacles  Group Date: 06/28/2023 Start Time: 1000 End Time: 1100 Facilitators: Ninfa Linden, NT+3 MHT 2  Department: [facility]  Number of Participants: 5  Group Focus: coping skills Treatment Modality:  Behavior Modification Therapy Interventions utilized were problem solving Purpose: trigger / craving management  Name: Mindel Friscia Date of Birth: [birth year]  MR: [id number]    Level of Participation: active Quality of Participation: offered feedback Interactions with others: gave feedback Mood/Affect: positive Triggers (if applicable): She said her thoughts are her triggers. Cognition: coherent/clear Progress: Significant Response: Responded well with the group. Plan: patient will be encouraged to keep working on her triggers.  Patients Problems:  Patient Active Problem List   Diagnosis Date Noted   Alcohol abuse 06/24/2023   Alcohol addiction (HCC) 04/07/2022   Obstructive sleep apnea syndrome 05/08/2020   Acute respiratory failure due to COVID-19 [facility]) 10/14/2019   ARF (acute renal failure) (HCC) 10/14/2019   Essential hypertension 10/14/2019   Controlled type 2 diabetes mellitus with hyperglycemia (HCC) 10/14/2019   Alcohol use disorder, severe, dependence (HCC) 06/06/2018   Deviated nasal septum 12/03/2017   Mixed hyperlipidemia 11/29/2017   Gastroesophageal reflux disease 10/06/2017   Chronic hepatitis C without hepatic coma (HCC) 06/04/2016   Benzodiazepine dependence (HCC) 09/21/2011   Bipolar 1 disorder, mixed, moderate (HCC) 09/21/2011   PTSD (post-traumatic stress disorder) 09/21/2011

## 2023-06-28 NOTE — ED Notes (Signed)
Patient A&Ox4. Denies intent to harm self/others when asked. Denies SI or A/VH. Patient denies any physical complaints. No acute distress observed. Routine safety checks conducted according to facility protocol. Patient agreed to notify staff if thoughts of harm toward self or others arise. Will continue to monitor for safety.

## 2023-06-28 NOTE — ED Provider Notes (Addendum)
Behavioral Health Progress Note  Date and Time: 06/28/2023 3:24 PM Name: Kristina Huffman MRN:  [id number]  Subjective:  Kristina Huffman is a 62 y.o. female, with PMH of AUD, benzodiazepine use d/o, PTSD, treated hepC, HTN, DM2-diet controlled, OSA, HLD, who presented to [facility] (06/24/2023) then admitted to [facility] detox after lapsing x1-2weeks in the setting of breaking up with boyfriend of 69yrs. Currently in CD-IOP  Anxiety improved, still present. Feels much more alert and awake, but still some difficulties with concentration. Denied craving. No rx side effects to med changes thus far.  Discussed at length about med recommendations, as she was concerned that the rx doses are too high.  Afterwards she was amenable to med changes per below.  Sleep and appetite are good. Denied active and passive SI, HI, AVH, paranoia.   Review of Systems  Constitutional:  Positive for malaise/fatigue. Negative for diaphoresis.  Respiratory:  Negative for shortness of breath.   Cardiovascular:  Negative for chest pain.  Gastrointestinal:  Negative for abdominal pain, nausea and vomiting.  Neurological:  Negative for dizziness, tremors, weakness and headaches.  Psychiatric/Behavioral:  The patient is nervous/anxious.     Diagnosis:  Final diagnoses:  Alcohol abuse  Anxious appearance  Alcohol use disorder, severe, dependence (HCC)    Past Psychiatric Hx: Current Psychiatrist: Dr. Maggie Schwalbe Received CDIOP follow up at [facility] Previous Psychiatric Diagnoses: MDD, bipolar I disorder, anxiety, alcohol use disorder Current psychiatric medications:Trazodone, Prozac, and Seroquel   Substance Abuse Hx: Alcohol: per urgent care documentation on 10/2 --"Patient reports that she basically drinks wine, up to 1 and 1/2 liters every day and "can't stop drinking". " Rehab hx: [facility] 03/25/2023-03/26/2023 for alcohol detox, requested to be discharged early and not complete detox at [facility]. Was discharged home with services for  CDIOP. [facility] in 04/07/2022 - "walk in with increased depression, SI and Alcohol intoxication. She was admitted to the continuous assessment unit. She was reevaluated at this time and recommended for admission to the [facility] to continue alcohol detox. " -- at the time referred to [facility] residential treatment center   Past Medical History: Medical Dx: Aneurysm of splenic artery, asthma, DM2 - diet controlled, GERD, hep C, hyperlipidemia, OSA, gout Medications: Lipitor 40 mg daily Vit D Pantoprazole Allergies: Sulfa antibiotics, aspirin   Family Psychiatric History: Per chart review  in 08/05/18 - Son died from intentional overdose of substances    Social History: Living Situation:Originally from [place], moved to [place] 24 years ago. Has been living in [place] for the past 10 years.   Marital Status: separated for the past 3 years,  Children: 3 children, 67 yo daughter, 5 yo son, 60 yo son Per chart review patient gave her second and third child up for adoption. Legal:denies Military: None identified on chart review Access to firearms: denies  Total Time spent with patient: 30 minutes  Additional Social History:                         Current Medications:  Current Facility-Administered Medications  Medication Dose Route Frequency Provider Last Rate Last Admin   albuterol (VENTOLIN HFA) 108 (90 Base) MCG/ACT inhaler 1 puff  1 puff Inhalation Q6H PRN Nelly Rout, MD       alum & mag hydroxide-simeth (MAALOX/MYLANTA) 200-200-20 MG/5ML suspension 30 mL  30 mL Oral Q4H PRN Sindy Guadeloupe, NP       atorvastatin (LIPITOR) tablet 40 mg  40 mg Oral QHS Sindy Guadeloupe, NP  40 mg at 06/27/23 2138   baclofen (LIORESAL) tablet 10 mg  10 mg Oral TID PRN Princess Bruins, DO       cholecalciferol (VITAMIN D3) 25 MCG (1000 UNIT) tablet 2,000 Units  2,000 Units Oral Daily Nelly Rout, MD   2,000 Units at 06/28/23 1610   colchicine tablet 0.6 mg  0.6 mg Oral Daily PRN Sindy Guadeloupe, NP        FLUoxetine (PROZAC) capsule 60 mg  60 mg Oral q AM Princess Bruins, DO   60 mg at 06/28/23 1252   gabapentin (NEURONTIN) capsule 300 mg  300 mg Oral TID Lorri Frederick, MD   300 mg at 06/28/23 0925   hydrochlorothiazide (HYDRODIURIL) tablet 25 mg  25 mg Oral Daily Sindy Guadeloupe, NP   25 mg at 06/28/23 9604   hydrOXYzine (ATARAX) tablet 50 mg  50 mg Oral Q6H PRN Princess Bruins, DO       OLANZapine zydis (ZYPREXA) disintegrating tablet 10 mg  10 mg Oral Q8H PRN Sindy Guadeloupe, NP       And   LORazepam (ATIVAN) tablet 1 mg  1 mg Oral PRN Sindy Guadeloupe, NP       And   ziprasidone (GEODON) injection 20 mg  20 mg Intramuscular PRN Sindy Guadeloupe, NP       magnesium hydroxide (MILK OF MAGNESIA) suspension 30 mL  30 mL Oral Daily PRN Sindy Guadeloupe, NP       multivitamin with minerals tablet 1 tablet  1 tablet Oral Daily Sindy Guadeloupe, NP   1 tablet at 06/28/23 5409   naltrexone (DEPADE) tablet 50 mg  50 mg Oral QHS Princess Bruins, DO       ondansetron (ZOFRAN-ODT) disintegrating tablet 4 mg  4 mg Oral Q6H PRN Princess Bruins, DO       pantoprazole (PROTONIX) EC tablet 40 mg  40 mg Oral Daily PRN Princess Bruins, DO       QUEtiapine (SEROQUEL) tablet 200 mg  200 mg Oral QHS Princess Bruins, DO   200 mg at 06/27/23 2138   thiamine (VITAMIN B1) injection 100 mg  100 mg Intramuscular Once Sindy Guadeloupe, NP       thiamine (VITAMIN B1) tablet 100 mg  100 mg Oral Daily Sindy Guadeloupe, NP   100 mg at 06/28/23 8119   traZODone (DESYREL) tablet 100 mg  100 mg Oral Dorthey Sawyer, NP   100 mg at 06/27/23 2138   Current Outpatient Medications  Medication Sig Dispense Refill   ACCU-CHEK GUIDE test strip test ONCE EVERY DAY     Accu-Chek Softclix Lancets lancets daily.     albuterol (VENTOLIN HFA) 108 (90 Base) MCG/ACT inhaler Inhale 2 puffs into the lungs every 6 (six) hours as needed for wheezing or shortness of breath. 8 g 0   atorvastatin (LIPITOR) 40 MG tablet Take 1 tablet (40 mg total) by mouth at bedtime.  30 tablet 0   baclofen (LIORESAL) 10 MG tablet Take 1 tablet (10 mg total) by mouth 3 (three) times daily. (Patient not taking: Reported on 06/24/2023) 90 tablet 1   Blood Glucose Monitoring Suppl (ACCU-CHEK GUIDE ME) w/Device KIT daily. as directed     Cholecalciferol (VITAMIN D3) 50 MCG (2000 UT) TABS Take 2,000 mcg by mouth daily. 30 tablet 0   colchicine 0.6 MG tablet Take 0.6 mg by mouth daily as needed (For gout).     DENTA 5000 PLUS 1.1 % CREA dental cream Take by mouth 2 (two) times daily.  FLUoxetine (PROZAC) 40 MG capsule Take 40 mg by mouth daily.     gabapentin (NEURONTIN) 600 MG tablet Take 600 mg by mouth 3 (three) times daily.     hydrochlorothiazide (HYDRODIURIL) 25 MG tablet Take 1 tablet (25 mg total) by mouth daily. 30 tablet 0   Omega-3 Fatty Acids (FISH OIL PO) Take 1 capsule by mouth at bedtime.     ondansetron (ZOFRAN) 4 MG tablet Take 4 mg by mouth every 6 (six) hours as needed for nausea or vomiting.     pantoprazole (PROTONIX) 40 MG tablet Take 1 tablet (40 mg total) by mouth daily. (Patient taking differently: Take 40 mg by mouth daily as needed (For heartburn or acid reflux).) 30 tablet 0   QUEtiapine (SEROQUEL) 100 MG tablet Take 100 mg by mouth 2 (two) times daily.     QUEtiapine (SEROQUEL) 200 MG tablet Take 1 tablet (200 mg total) by mouth at bedtime. 30 tablet 0   traZODone (DESYREL) 100 MG tablet Take 1 tablet (100 mg total) by mouth at bedtime. 30 tablet 0    Labs  Lab Results:  Admission on 06/24/2023  Component Date Value Ref Range Status   Troponin I (High Sensitivity) 06/27/2023 3  <18 ng/L Final   Comment: (NOTE) Elevated high sensitivity troponin I (hsTnI) values and significant  changes across serial measurements may suggest ACS but many other  chronic and acute conditions are known to elevate hsTnI results.  Refer to the "Links" section for chest pain algorithms and additional  guidance. Performed at [facility], [address]  [place], [place] [address]   Counselor on 06/26/2023  Component Date Value Ref Range Status   Troponin I (High Sensitivity) 06/27/2023 3  <18 ng/L Final   Comment: (NOTE) Elevated high sensitivity troponin I (hsTnI) values and significant  changes across serial measurements may suggest ACS but many other  chronic and acute conditions are known to elevate hsTnI results.  Refer to the "Links" section for chest pain algorithms and additional  guidance. Performed at [facility], [address], [place], [place] [address]   Admission on 06/24/2023, Discharged on 06/24/2023  Component Date Value Ref Range Status   WBC 06/24/2023 4.8  4.0 - 10.5 K/uL Final   RBC 06/24/2023 4.65  3.87 - 5.11 MIL/uL Final   Hemoglobin 06/24/2023 14.2  12.0 - 15.0 g/dL Final   HCT 09/81/1914 42.4  36.0 - 46.0 % Final   MCV 06/24/2023 91.2  80.0 - 100.0 fL Final   MCH 06/24/2023 30.5  26.0 - 34.0 pg Final   MCHC 06/24/2023 33.5  30.0 - 36.0 g/dL Final   RDW 78/29/5621 13.2  11.5 - 15.5 % Final   Platelets 06/24/2023 240  150 - 400 K/uL Final   nRBC 06/24/2023 0.0  0.0 - 0.2 % Final   Neutrophils Relative % 06/24/2023 53  % Final   Neutro Abs 06/24/2023 2.6  1.7 - 7.7 K/uL Final   Lymphocytes Relative 06/24/2023 37  % Final   Lymphs Abs 06/24/2023 1.7  0.7 - 4.0 K/uL Final   Monocytes Relative 06/24/2023 7  % Final   Monocytes Absolute 06/24/2023 0.3  0.1 - 1.0 K/uL Final   Eosinophils Relative 06/24/2023 2  % Final   Eosinophils Absolute 06/24/2023 0.1  0.0 - 0.5 K/uL Final   Basophils Relative 06/24/2023 1  % Final   Basophils Absolute 06/24/2023 0.0  0.0 - 0.1 K/uL Final   Immature Granulocytes 06/24/2023 0  %  Final   Abs Immature Granulocytes 06/24/2023 0.01  0.00 - 0.07 K/uL Final   Performed at [facility], [address] [place], [place] [address]   Sodium 06/24/2023 137  135 - 145 mmol/L Final   Potassium 06/24/2023 3.6  3.5 - 5.1 mmol/L Final   Chloride 06/24/2023 105  98 - 111 mmol/L  Final   CO2 06/24/2023 21 (L)  22 - 32 mmol/L Final   Glucose, Bld 06/24/2023 103 (H)  70 - 99 mg/dL Final   Glucose reference range applies only to samples taken after fasting for at least 8 hours.   BUN 06/24/2023 8  6 - 20 mg/dL Final   Creatinine, Ser 06/24/2023 0.76  0.44 - 1.00 mg/dL Final   Calcium 60/45/4098 9.0  8.9 - 10.3 mg/dL Final   Total Protein 11/91/4782 7.0  6.5 - 8.1 g/dL Final   Albumin 95/62/1308 3.9  3.5 - 5.0 g/dL Final   AST 65/78/4696 52 (H)  15 - 41 U/L Final   ALT 06/24/2023 53 (H)  0 - 44 U/L Final   Alkaline Phosphatase 06/24/2023 79  38 - 126 U/L Final   Total Bilirubin 06/24/2023 1.1  0.3 - 1.2 mg/dL Final   GFR, Estimated 06/24/2023 >60  >60 mL/min Final   Comment: (NOTE) Calculated using the CKD-EPI Creatinine Equation (2021)    Anion gap 06/24/2023 11  5 - 15 Final   Performed at [facility], [address], [place], [place] [address]   Hgb A1c MFr Bld 06/24/2023 6.1 (H)  4.8 - 5.6 % Final   Comment: (NOTE) Pre diabetes:          5.7%-6.4%  Diabetes:              >6.4%  Glycemic control for   <7.0% adults with diabetes    Mean Plasma Glucose 06/24/2023 128.37  mg/dL Final   Performed at [facility], [address], [place], [place] [address]   Magnesium 06/24/2023 2.0  1.7 - 2.4 mg/dL Final   Performed at [facility], [address] [place], [place] [address]   Alcohol, Ethyl (B) 06/24/2023 180 (H)  <10 mg/dL Final   Comment: (NOTE) Lowest detectable limit for serum alcohol is 10 mg/dL.  For medical purposes only. Performed at [facility], [address], [place], [place] [address]    Cholesterol 06/24/2023 237 (H)  0 - 200 mg/dL Final   Triglycerides 64/40/3474 328 (H)  <150 mg/dL Final   HDL 25/95/6387 52  >40 mg/dL Final   Total CHOL/HDL Ratio 06/24/2023 4.6  RATIO Final   VLDL 06/24/2023 66 (H)  0 - 40 mg/dL Final   LDL Cholesterol 06/24/2023 119 (H)  0 - 99 mg/dL Final   Comment:        Total  Cholesterol/HDL:CHD Risk Coronary Heart Disease Risk Table                     Men   Women  1/2 Average Risk   3.4   3.3  Average Risk       5.0   4.4  2 X Average Risk   9.6   7.1  3 X Average Risk  23.4   11.0        Use the calculated Patient Ratio above and the CHD Risk Table to determine the patient's CHD Risk.        ATP III CLASSIFICATION (LDL):  <  100     mg/dL   Optimal  161-096  mg/dL   Near or Above                    Optimal  130-159  mg/dL   Borderline  045-409  mg/dL   High  >811     mg/dL   Very High Performed at [facility], [address] [place], [place] [address]    TSH 06/24/2023 2.250  0.350 - 4.500 uIU/mL Final   Comment: Performed by a 3rd Generation assay with a functional sensitivity of <=0.01 uIU/mL. Performed at [facility], [address], [place], [place] [address]    RPR Ser Ql 06/24/2023 NON REACTIVE  NON REACTIVE Final   Performed at [facility], [address], [place], [place] [address]   Color, Urine 06/24/2023 STRAW (A)  YELLOW Final   APPearance 06/24/2023 CLEAR  CLEAR Final   Specific Gravity, Urine 06/24/2023 1.002 (L)  1.005 - 1.030 Final   pH 06/24/2023 6.0  5.0 - 8.0 Final   Glucose, UA 06/24/2023 NEGATIVE  NEGATIVE mg/dL Final   Hgb urine dipstick 06/24/2023 NEGATIVE  NEGATIVE Final   Bilirubin Urine 06/24/2023 NEGATIVE  NEGATIVE Final   Ketones, ur 06/24/2023 NEGATIVE  NEGATIVE mg/dL Final   Protein, ur 57/84/6962 NEGATIVE  NEGATIVE mg/dL Final   Nitrite 95/28/4132 NEGATIVE  NEGATIVE Final   Leukocytes,Ua 06/24/2023 NEGATIVE  NEGATIVE Final   Performed at [facility], [address], [place], [place] [address]   POC Amphetamine UR 06/24/2023 None Detected  NONE DETECTED (Cut Off Level 1000 ng/mL) Final   POC Secobarbital (BAR) 06/24/2023 None Detected  NONE DETECTED (Cut Off Level 300 ng/mL) Final   POC Buprenorphine (BUP) 06/24/2023 None Detected  NONE DETECTED (Cut Off Level 10 ng/mL) Final   POC  Oxazepam (BZO) 06/24/2023 None Detected  NONE DETECTED (Cut Off Level 300 ng/mL) Final   POC Cocaine UR 06/24/2023 None Detected  NONE DETECTED (Cut Off Level 300 ng/mL) Final   POC Methamphetamine UR 06/24/2023 None Detected  NONE DETECTED (Cut Off Level 1000 ng/mL) Final   POC Morphine 06/24/2023 None Detected  NONE DETECTED (Cut Off Level 300 ng/mL) Final   POC Methadone UR 06/24/2023 None Detected  NONE DETECTED (Cut Off Level 300 ng/mL) Final   POC Oxycodone UR 06/24/2023 None Detected  NONE DETECTED (Cut Off Level 100 ng/mL) Final   POC Marijuana UR 06/24/2023 None Detected  NONE DETECTED (Cut Off Level 50 ng/mL) Final  Admission on 03/25/2023, Discharged on 03/26/2023  Component Date Value Ref Range Status   WBC 03/25/2023 6.0  4.0 - 10.5 K/uL Final   RBC 03/25/2023 4.95  3.87 - 5.11 MIL/uL Final   Hemoglobin 03/25/2023 15.6 (H)  12.0 - 15.0 g/dL Final   HCT 27/25/3664 46.1 (H)  36.0 - 46.0 % Final   MCV 03/25/2023 93.1  80.0 - 100.0 fL Final   MCH 03/25/2023 31.5  26.0 - 34.0 pg Final   MCHC 03/25/2023 33.8  30.0 - 36.0 g/dL Final   RDW 40/34/7425 12.7  11.5 - 15.5 % Final   Platelets 03/25/2023 239  150 - 400 K/uL Final   nRBC 03/25/2023 0.0  0.0 - 0.2 % Final   Neutrophils Relative % 03/25/2023 51  % Final   Neutro Abs 03/25/2023 3.1  1.7 - 7.7 K/uL Final   Lymphocytes Relative 03/25/2023 40  % Final   Lymphs Abs 03/25/2023 2.4  0.7 - 4.0 K/uL Final   Monocytes Relative 03/25/2023 6  % Final   Monocytes Absolute 03/25/2023 0.3  0.1 - 1.0 K/uL Final   Eosinophils Relative 03/25/2023 2  % Final   Eosinophils Absolute 03/25/2023 0.1  0.0 - 0.5 K/uL Final   Basophils Relative 03/25/2023 1  % Final   Basophils Absolute 03/25/2023 0.0  0.0 - 0.1 K/uL Final   Immature Granulocytes 03/25/2023 0  % Final   Abs Immature Granulocytes 03/25/2023 0.02  0.00 - 0.07 K/uL Final   Performed at [facility], [address] [place], [place] [address]   Sodium 03/25/2023 136  135 - 145  mmol/L Final   Potassium 03/25/2023 4.0  3.5 - 5.1 mmol/L Final   Chloride 03/25/2023 101  98 - 111 mmol/L Final   CO2 03/25/2023 22  22 - 32 mmol/L Final   Glucose, Bld 03/25/2023 112 (H)  70 - 99 mg/dL Final   Glucose reference range applies only to samples taken after fasting for at least 8 hours.   BUN 03/25/2023 11  6 - 20 mg/dL Final   Creatinine, Ser 03/25/2023 0.73  0.44 - 1.00 mg/dL Final   Calcium 60/45/4098 9.5  8.9 - 10.3 mg/dL Final   Total Protein 11/91/4782 7.2  6.5 - 8.1 g/dL Final   Albumin 95/62/1308 4.2  3.5 - 5.0 g/dL Final   AST 65/78/4696 51 (H)  15 - 41 U/L Final   ALT 03/25/2023 46 (H)  0 - 44 U/L Final   Alkaline Phosphatase 03/25/2023 86  38 - 126 U/L Final   Total Bilirubin 03/25/2023 1.1  0.3 - 1.2 mg/dL Final   GFR, Estimated 03/25/2023 >60  >60 mL/min Final   Comment: (NOTE) Calculated using the CKD-EPI Creatinine Equation (2021)    Anion gap 03/25/2023 13  5 - 15 Final   Performed at [facility], [address] [place], [place] [address]   Hgb A1c MFr Bld 03/25/2023 5.7 (H)  4.8 - 5.6 % Final   Comment: (NOTE) Pre diabetes:          5.7%-6.4%  Diabetes:              >6.4%  Glycemic control for   <7.0% adults with diabetes    Mean Plasma Glucose 03/25/2023 116.89  mg/dL Final   Performed at [facility], [address], [place], [place] [address]   Alcohol, Ethyl (B) 03/25/2023 211 (H)  <10 mg/dL Final   Comment: (NOTE) Lowest detectable limit for serum alcohol is 10 mg/dL.  For medical purposes only. Performed at [facility], [address] [place], [place] [address]    Cholesterol 03/25/2023 180  0 - 200 mg/dL Final   Triglycerides 72/53/6644 416 (H)  <150 mg/dL Final   HDL 03/47/4259 51  >40 mg/dL Final   Total CHOL/HDL Ratio 03/25/2023 3.5  RATIO Final   VLDL 03/25/2023 UNABLE TO CALCULATE IF TRIGLYCERIDE OVER 400 mg/dL  0 - 40 mg/dL Final   LDL Cholesterol 03/25/2023 UNABLE TO CALCULATE IF TRIGLYCERIDE OVER 400  mg/dL  0 - 99 mg/dL Final   Comment:        Total Cholesterol/HDL:CHD Risk Coronary Heart Disease Risk Table                     Men   Women  1/2 Average Risk   3.4   3.3  Average Risk       5.0  4.4  2 X Average Risk   9.6   7.1  3 X Average Risk  23.4   11.0        Use the calculated Patient Ratio above and the CHD Risk Table to determine the patient's CHD Risk.        ATP III CLASSIFICATION (LDL):  <100     mg/dL   Optimal  161-096  mg/dL   Near or Above                    Optimal  130-159  mg/dL   Borderline  045-409  mg/dL   High  >811     mg/dL   Very High Performed at [facility], [address] [place], [place] [address]    TSH 03/25/2023 3.277  0.350 - 4.500 uIU/mL Final   Comment: Performed by a 3rd Generation assay with a functional sensitivity of <=0.01 uIU/mL. Performed at [facility], [address], [place], [place] [address]    Preg Test, Ur 03/25/2023 Negative  Negative Final   POC Amphetamine UR 03/25/2023 None Detected  NONE DETECTED (Cut Off Level 1000 ng/mL) Final   POC Secobarbital (BAR) 03/25/2023 None Detected  NONE DETECTED (Cut Off Level 300 ng/mL) Final   POC Buprenorphine (BUP) 03/25/2023 None Detected  NONE DETECTED (Cut Off Level 10 ng/mL) Final   POC Oxazepam (BZO) 03/25/2023 None Detected  NONE DETECTED (Cut Off Level 300 ng/mL) Final   POC Cocaine UR 03/25/2023 None Detected  NONE DETECTED (Cut Off Level 300 ng/mL) Final   POC Methamphetamine UR 03/25/2023 None Detected  NONE DETECTED (Cut Off Level 1000 ng/mL) Final   POC Morphine 03/25/2023 None Detected  NONE DETECTED (Cut Off Level 300 ng/mL) Final   POC Methadone UR 03/25/2023 None Detected  NONE DETECTED (Cut Off Level 300 ng/mL) Final   POC Oxycodone UR 03/25/2023 None Detected  NONE DETECTED (Cut Off Level 100 ng/mL) Final   POC Marijuana UR 03/25/2023 None Detected  NONE DETECTED (Cut Off Level 50 ng/mL) Final   Preg Test, Ur 03/25/2023 NEGATIVE  NEGATIVE Final    Comment:        THE SENSITIVITY OF THIS METHODOLOGY IS >24 mIU/mL    Direct LDL 03/25/2023 74  0 - 99 mg/dL Final   Performed at [facility], [address], [place], [place] [address]    Blood Alcohol level:  Lab Results  Component Value Date   ETH 180 (H) 06/24/2023   ETH 211 (H) 03/25/2023    Metabolic Disorder Labs: Lab Results  Component Value Date   HGBA1C 6.1 (H) 06/24/2023   MPG 128.37 06/24/2023   MPG 116.89 03/25/2023   No results found for: "PROLACTIN" Lab Results  Component Value Date   CHOL 237 (H) 06/24/2023   TRIG 328 (H) 06/24/2023   HDL 52 06/24/2023   CHOLHDL 4.6 06/24/2023   VLDL 66 (H) 06/24/2023   LDLCALC 119 (H) 06/24/2023   LDLCALC UNABLE TO CALCULATE IF TRIGLYCERIDE OVER 400 mg/dL 57/84/6962    Therapeutic Lab Levels: Lab Results  Component Value Date   LITHIUM <0.25 (L) 07/03/2013   LITHIUM <0.25 (L) 11/11/2011   No results found for: "VALPROATE" No results found for: "CBMZ"  Physical Findings   AIMS    Flowsheet Row [facility]  AIMS Total Score 0      AUDIT    Flowsheet Row Admission (Discharged)  [facility] Ad[facility] (Discharged) [facility] Hosp-Admission (Discharged) [facility]  Alcohol Use Disorder Identification Test Final Score (AUDIT) 33 23 33      GAD-7    Flowsheet Row Counselor from 06/12/2023 in [facility] Office Visit from [facility]/25[facility]2022 [facility] Office Visit from 11/12/2020 in [facility] Office Visit from 03/18/2017 in [facility]  Total GAD-7 Score 14 21 0 7      PHQ2-9    Flowsheet Row ED from 06/24/2023 in  [facility] Counselor from 06/12/2023 in [facility] Counselor from 01/08/2023 in [facility] Visit from 12/25/2022 in [facility] ED from 04/06/2022 in [facility]  PHQ-2 Total Score 2 3 4 5 1   PHQ-9 Total Score 6 7 13 23 17       Flowsheet Row ED from 06/24/2023 in [facility] Most recent reading at 06/24/2023 11:36 PM ED from 06/24/2023 in [facility] Most recent reading at 06/24/2023  4:13 PM ED from 06/09/2023 in [facility] [place] Most recent reading at 06/09/2023  5:46 PM  C-SSRS RISK CATEGORY No Risk No Risk No Risk        Musculoskeletal  Strength & Muscle Tone: within normal limited Gait & Station: normal  Patient leans: NA   Psychiatric Specialty Exam  Presentation  General Appearance:  Appropriate for Environment; Casual; Disheveled   Eye Contact: Good   Speech: Clear and Coherent; Normal Rate   Speech Volume: Normal   Handedness: Right    Mood and Affect  Mood: Anxious   Affect: Appropriate; Congruent; Restricted    Thought Process  Thought Processes: Coherent; Goal Directed; Linear   Descriptions of Associations:Intact   Orientation:Full (Time, Place and Person)   Thought Content:Rumination; Perseveration   Diagnosis of Schizophrenia or Schizoaffective disorder in past: No   Duration of Psychotic Symptoms: No data recorded   Hallucinations:Hallucinations: None    Ideas of Reference:None   Suicidal Thoughts:Suicidal Thoughts: No    Homicidal Thoughts:Homicidal Thoughts: No     Sensorium  Memory: Immediate Fair; Recent Fair   Judgment: Fair   Insight: Shallow    Executive Functions  Concentration: Good   Attention  Span: Fair   Recall: Good   Fund of Knowledge: Good   Language: Good    Psychomotor Activity  Psychomotor Activity: Psychomotor Activity: Tremor     Assets  Assets: Communication Skills; Desire for Improvement; Resilience    Sleep  Sleep: Sleep: Fair     No data recorded    Physical Exam  Physical Exam Vitals and nursing note reviewed.  Constitutional:      General: She is not in acute distress.    Appearance: She is not ill-appearing, toxic-appearing or diaphoretic.  HENT:     Head: Normocephalic and atraumatic.  Pulmonary:     Effort: Pulmonary effort is normal. No respiratory distress.  Neurological:     General: No focal deficit present.     Mental Status: She is alert and oriented to person, place, and time.     Gait: Gait normal.    Blood pressure  115/79, pulse 93, temperature 98.7 F (37.1 C), temperature source Oral, resp. rate 19, last menstrual period 08/02/2013, SpO2 96%. There is no height or weight on file to calculate BMI.  Treatment Plan Summary: Daily contact with patient to assess and evaluate symptoms and progress in treatment and Medication management  AUD W/d sxs 10/5 evening, improving now. Tolerated rx well thus far. CIWA + PRN per protocol Ativan taper (ends 10/6) Continue home Baclofen 10 mg TID PRN Continued home gabapentin 300 mg TID INCREASED naltrexone 25 mg at bedtime to 50 mg qHS  SIMD  PTSD Still pretty anxious in general, alleviated with rx changes per below Continued home prozac 60 mg daily Continued home trazodone 100 mg at bedtime Continued home seroquel 200 mg at bedtime  Gabapentin per above  Chest pain, RESOLVED Sxs chest pain, nausea, diaphoresis.  She has risk factors, but r/o acs. Suspect w/d vs anxiety Continue to monitor  HLD - lipitor 40 mg at bedtime HTN - hydrochlorothiazide 25 mg daily OSA GERD - protonix Treated hep C, elevated AST/ALT Gout - colchicine 0.6 mg PRN  Dispo: Return  to IOP Monday  Princess Bruins, DO Psych Resident, PGY-3 06/28/2023 3:24 PM

## 2023-06-28 NOTE — ED Notes (Signed)
Patient currently in the shower. Is in no acute distress. Will continue to monitor for safety.

## 2023-06-28 NOTE — ED Notes (Signed)
 Patient in the bedroom sleeping. NAD.  Respirations are even and unlabored. Will continue to monitor for safety.

## 2023-06-28 NOTE — ED Notes (Signed)
Patient observed in the dining room interacting with peers. Patient states she feels better today than she has in a while. She states her intention for today is to continue reading the "Big Book." She denies any needs at this time. We will continue to monitor for safety.

## 2023-06-28 NOTE — ED Notes (Signed)
Patient is sleeping. Respirations equal and unlabored, skin warm and dry. No change in assessment or acuity. Routine safety checks conducted according to facility protocol. Will continue to monitor for safety.   

## 2023-06-28 NOTE — ED Notes (Signed)
Pt is in the dayroom watching TV with peers. Pt denies SI/HI/AVH. No acute distress noted. Will continue to monitor for safety. 

## 2023-06-29 ENCOUNTER — Ambulatory Visit (HOSPITAL_COMMUNITY): Payer: MEDICAID

## 2023-06-29 ENCOUNTER — Encounter (HOSPITAL_COMMUNITY): Payer: Self-pay

## 2023-06-29 ENCOUNTER — Encounter (HOSPITAL_COMMUNITY): Payer: Self-pay | Admitting: Medical

## 2023-06-29 ENCOUNTER — Ambulatory Visit (HOSPITAL_COMMUNITY): Payer: MEDICAID | Admitting: Medical

## 2023-06-29 DIAGNOSIS — F319 Bipolar disorder, unspecified: Secondary | ICD-10-CM

## 2023-06-29 DIAGNOSIS — T7401XA Adult neglect or abandonment, confirmed, initial encounter: Secondary | ICD-10-CM

## 2023-06-29 DIAGNOSIS — F10239 Alcohol dependence with withdrawal, unspecified: Secondary | ICD-10-CM | POA: Diagnosis not present

## 2023-06-29 DIAGNOSIS — F4381 Prolonged grief disorder: Secondary | ICD-10-CM

## 2023-06-29 DIAGNOSIS — T7401XS Adult neglect or abandonment, confirmed, sequela: Secondary | ICD-10-CM

## 2023-06-29 DIAGNOSIS — T7491XS Unspecified adult maltreatment, confirmed, sequela: Secondary | ICD-10-CM

## 2023-06-29 DIAGNOSIS — F4310 Post-traumatic stress disorder, unspecified: Secondary | ICD-10-CM

## 2023-06-29 DIAGNOSIS — M1A9XX0 Chronic gout, unspecified, without tophus (tophi): Secondary | ICD-10-CM

## 2023-06-29 DIAGNOSIS — F1020 Alcohol dependence, uncomplicated: Secondary | ICD-10-CM

## 2023-06-29 DIAGNOSIS — F419 Anxiety disorder, unspecified: Secondary | ICD-10-CM | POA: Diagnosis not present

## 2023-06-29 DIAGNOSIS — F3132 Bipolar disorder, current episode depressed, moderate: Secondary | ICD-10-CM

## 2023-06-29 DIAGNOSIS — F10229 Alcohol dependence with intoxication, unspecified: Secondary | ICD-10-CM | POA: Diagnosis not present

## 2023-06-29 MED ORDER — QUETIAPINE FUMARATE 200 MG PO TABS: 200.0000 mg | ORAL_TABLET | Freq: Every day | ORAL | 1 refills | Status: AC

## 2023-06-29 MED ORDER — FLUOXETINE HCL 20 MG PO CAPS: 60.0000 mg | ORAL_CAPSULE | Freq: Every morning | ORAL | 1 refills | Status: AC

## 2023-06-29 MED ORDER — NALTREXONE HCL 50 MG PO TABS: 50.0000 mg | ORAL_TABLET | Freq: Every day | ORAL | 1 refills | Status: AC

## 2023-06-29 MED ORDER — TRAZODONE HCL 100 MG PO TABS: 100.0000 mg | ORAL_TABLET | Freq: Every day | ORAL | 1 refills | Status: AC
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
## 2023-06-29 NOTE — Discharge Planning (Signed)
LCSW followed up with CDIOP providers regarding patient returning to group on today. Per Alene Mires, patient can present to group on today once discharged. MD made aware and patient to discharge today with resumption of care. Additional resources provided for residential placement as needed. No other needs to report.   Please inform if further LCSW needs arise prior to discharge from [facility].   Fernande Boyden, LCSW Clinical Social Worker [facility] Ph: [phone]
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
## 2023-06-29 NOTE — Discharge Summary (Signed)
Kristina Huffman to be D/C'd Home per MD order. Discussed with the patient and all questions fully answered. An After Visit Summary was printed and given to the patient. All belongings returned. Patient escorted out  and D/C home via private auto.  [name]  06/29/2023 11:00 AM

## 2023-06-29 NOTE — ED Notes (Signed)
Patient is sleeping. Respirations equal and unlabored, skin warm and dry. No change in assessment or acuity. Routine safety checks conducted according to facility protocol. Will continue to monitor for safety.   

## 2023-06-29 NOTE — ED Notes (Signed)
Pt wanted clothes out her locker to change in for her appointment upstairs. The pants she's requesting has strings I told her I would let the MHT on 1st shift know so she can take her to the locker room to change clothes when she is discharged.

## 2023-06-29 NOTE — Progress Notes (Signed)
Pt is awake, alert and oriented X3. Pt did not voice any complaints of pain or discomfort. No signs of acute distress noted. Administered scheduled meds per order. Pt denies current SI/HI/AVH, plan or intent. Staff will monitor for pt's safety.
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
## 2023-07-01 ENCOUNTER — Ambulatory Visit (HOSPITAL_COMMUNITY): Payer: MEDICAID

## 2023-07-01 ENCOUNTER — Encounter (HOSPITAL_COMMUNITY): Payer: Self-pay

## 2023-07-01 ENCOUNTER — Telehealth (HOSPITAL_COMMUNITY): Payer: Self-pay

## 2023-07-01 NOTE — Telephone Encounter (Signed)
She stated has not experienced the effect of the full dose of the antidepressant she was given in detox.  Kristina Huffman says she will come to Friday's group.  Kristina Eisenmenger, MS., LMFT, LCAS

## 2023-07-03 ENCOUNTER — Telehealth (HOSPITAL_COMMUNITY): Payer: Self-pay

## 2023-07-03 ENCOUNTER — Encounter (HOSPITAL_COMMUNITY): Payer: Self-pay

## 2023-07-03 ENCOUNTER — Ambulatory Visit (HOSPITAL_COMMUNITY): Payer: MEDICAID

## 2023-07-03 NOTE — Telephone Encounter (Signed)
Liani returns this therapist's call.  Lawonda says that she was so depressed that she did drink some alcohol on Wednesday, 07-01-23. Therapist discusses residential treatment with Jakki and provides information to call [facility] and tell her this therapist will call [facility] in [place].  Therapist calls [facility] and asked for female residential openings. This therapist was told that they would not have any openings for females or males until around July 24, 2023.  Therapist calls Jaelin back and provides this information and inquires as to what information Kamla got from [place]. Ivorie says that [facility] told her they could do a phone pre-screening, but they do not have any openings until July 24, 2023.  Yareth says she really does not want to return to [facility], as she was there in the past and a nurse "flipped" out and Blasa left. After she left, Caoimhe says a counselor called her back and asked her to return, however Natallia did not agree to return.  Brieanna asks to return to [facility] on Monday, 07-06-23 and see how she does and if she feels she still needs residential, she will call [facility] on July 24, 2023.  [name], MS, LMFT, LCAS

## 2023-07-06 ENCOUNTER — Encounter (HOSPITAL_COMMUNITY): Payer: Self-pay | Admitting: Medical

## 2023-07-06 ENCOUNTER — Telehealth (HOSPITAL_COMMUNITY): Payer: Self-pay

## 2023-07-06 ENCOUNTER — Ambulatory Visit (INDEPENDENT_AMBULATORY_CARE_PROVIDER_SITE_OTHER): Payer: MEDICAID | Admitting: Medical

## 2023-07-06 DIAGNOSIS — F4310 Post-traumatic stress disorder, unspecified: Secondary | ICD-10-CM

## 2023-07-06 DIAGNOSIS — F4381 Prolonged grief disorder: Secondary | ICD-10-CM

## 2023-07-06 DIAGNOSIS — T7401XA Adult neglect or abandonment, confirmed, initial encounter: Secondary | ICD-10-CM

## 2023-07-06 DIAGNOSIS — F1020 Alcohol dependence, uncomplicated: Secondary | ICD-10-CM

## 2023-07-06 DIAGNOSIS — T7491XS Unspecified adult maltreatment, confirmed, sequela: Secondary | ICD-10-CM

## 2023-07-06 DIAGNOSIS — F319 Bipolar disorder, unspecified: Secondary | ICD-10-CM

## 2023-07-06 DIAGNOSIS — M1A9XX0 Chronic gout, unspecified, without tophus (tophi): Secondary | ICD-10-CM

## 2023-07-06 DIAGNOSIS — F3132 Bipolar disorder, current episode depressed, moderate: Secondary | ICD-10-CM

## 2023-07-06 DIAGNOSIS — Z91199 Patient's noncompliance with other medical treatment and regimen due to unspecified reason: Secondary | ICD-10-CM

## 2023-07-06 NOTE — Telephone Encounter (Signed)
Therapist calls Sadhana to speak to her about why she will not be returning to [facility].  Therapist leaves HIPAA compliant voice mail requesting a return call.

## 2023-07-06 NOTE — Telephone Encounter (Signed)
PT called requesting DWI and DUIs classes to get her DL back, I asked PT if she was a PT here, she stated no - I then reached out to Provider that PT sees - provider gave me information which I then forwarded to PT. - PT got quiet over the phone I asked her if there was anything else I could help her with and she responded will fuck you.
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
## 2023-07-08 ENCOUNTER — Ambulatory Visit (HOSPITAL_COMMUNITY): Payer: MEDICAID

## 2023-07-09 ENCOUNTER — Ambulatory Visit (HOSPITAL_COMMUNITY): Payer: MEDICAID

## 2023-07-10 ENCOUNTER — Ambulatory Visit (HOSPITAL_COMMUNITY): Payer: MEDICAID

## 2023-07-13 ENCOUNTER — Ambulatory Visit (HOSPITAL_COMMUNITY): Payer: MEDICAID

## 2023-07-15 ENCOUNTER — Ambulatory Visit (HOSPITAL_COMMUNITY): Payer: MEDICAID

## 2023-07-20 ENCOUNTER — Telehealth: Payer: MEDICAID | Admitting: Physician Assistant

## 2023-07-20 ENCOUNTER — Telehealth: Payer: MEDICAID

## 2023-07-20 DIAGNOSIS — T560X1A Toxic effect of lead and its compounds, accidental (unintentional), initial encounter: Secondary | ICD-10-CM | POA: Diagnosis not present

## 2023-07-20 DIAGNOSIS — M10169 Lead-induced gout, unspecified knee: Secondary | ICD-10-CM

## 2023-07-20 MED ORDER — COLCHICINE 0.6 MG PO TABS: 0.6000 mg | ORAL_TABLET | Freq: Every day | ORAL | 0 refills | Status: AC

## 2023-07-20 MED ORDER — PREDNISONE 20 MG PO TABS
40.0000 mg | ORAL_TABLET | Freq: Every day | ORAL | 0 refills | Status: AC
Start: 2023-07-20 — End: 2023-07-27

## 2023-07-20 NOTE — Progress Notes (Signed)
Entered in error, previous evisit completed. Additional evist entered by patient.

## 2023-07-20 NOTE — Progress Notes (Signed)
E-Visit for Gout Symptoms  We are sorry that you are not feeling well. We are here to help!  Based on what you shared with me it looks like you have a flare of your gout.  Gout is a form of arthritis. It can cause pain and swelling in the joints. At first, it tends to affect only 1 joint - most frequently the big toe. It happens in people who have too much uric acid in the blood. Uric acid is a chemical that is produced when the body breaks down certain foods. Uric acid can form sharp needle-like crystals that build up in the joints and cause pain. Uric acid crystals can also form inside the tubes that carry urine from the kidneys to the bladder. These crystals can turn into "kidney stones" that can cause pain and problems with the flow of urine. People with gout get sudden "flares" or attacks of severe pain, most often the big toe, ankle, or knee. Often the joint also turns red and swells. Usually, only 1 joint is affected, but some people have pain in more than 1 joint. Gout flares tend to happen more often during the night.  The pain from gout can be extreme. The pain and swelling are worst at the beginning of a gout flare. The symptoms then get better within a few days to weeks. It is not clear how the body "turns off" a gout flare.  Do not start any NEW preventative medicine until the gout has cleared completely. However, If you are already on Probenecid or Allopurinol for CHRONIC gout, you may continue taking this during an active flare up  I have prescribed Prednisone 40 mg daily for 7   HOME CARE Losing weight can help relieve gout. It's not clear that following a specific diet plan will help with gout symptoms but eating a balanced diet can help improve your overall health. It can also help you lose weight, if you are overweight. In general, a healthy diet includes plenty of fruits, vegetables, whole grains, and low-fat dairy products (labelled "low fat", skim, 2%). Avoid sugar sweetened  drinks (including sodas, tea, juice and juice blends, coffee drinks and sports drinks) Limit alcohol to 1-2 drinks of beer, spirits or wine daily these can make gout flares worse. Some people with gout also have other health problems, such as heart disease, high blood pressure, kidney disease, or obesity. If you have any of these issues, it's important to work with your doctor to manage them. This can help improve your overall health and might also help with your gout.  GET HELP RIGHT AWAY IF: Your symptoms persist after you have completed your treatment plan You develop severe diarrhea You develop abnormal sensations  You develop vomiting,   You develop weakness  You develop abdominal pain  FOLLOW UP WITH YOUR PRIMARY PROVIDER IF: If your symptoms do not improve within 10 days  MAKE SURE YOU  Understand these instructions. Will watch your condition. Will get help right away if you are not doing well or get worse.  Thank you for choosing an e-visit.  Your e-visit answers were reviewed by a board certified advanced clinical practitioner to complete your personal care plan. Depending upon the condition, your plan could have included both over the counter or prescription medications.  Please review your pharmacy choice. Make sure the pharmacy is open so you can pick up prescription now. If there is a problem, you may contact your provider through [employer] and have  the prescription routed to another pharmacy.  Your safety is important to [place]. If you have drug allergies check your prescription carefully.   For the next 24 hours you can use MyChart to ask questions about today's visit, request a non-urgent call back, or ask for a work or school excuse. You will get an email in the next two days asking about your experience. I hope that your e-visit has been valuable and will speed your recovery.   Christeen Douglas, [name]

## 2023-07-29 ENCOUNTER — Other Ambulatory Visit: Payer: MEDICAID

## 2023-08-13 ENCOUNTER — Encounter (HOSPITAL_BASED_OUTPATIENT_CLINIC_OR_DEPARTMENT_OTHER): Payer: Self-pay

## 2023-08-13 DIAGNOSIS — G4733 Obstructive sleep apnea (adult) (pediatric): Secondary | ICD-10-CM

## 2023-08-14 ENCOUNTER — Ambulatory Visit
Admission: RE | Admit: 2023-08-14 | Discharge: 2023-08-14 | Disposition: A | Discharge status: Home / Self Care | Payer: MEDICAID | Source: Ambulatory Visit | Attending: Internal Medicine | Admitting: Internal Medicine

## 2023-08-14 ENCOUNTER — Other Ambulatory Visit: Payer: Self-pay | Admitting: Internal Medicine

## 2023-08-14 DIAGNOSIS — N6312 Unspecified lump in the right breast, upper inner quadrant: Secondary | ICD-10-CM

## 2023-08-14 DIAGNOSIS — R928 Other abnormal and inconclusive findings on diagnostic imaging of breast: Secondary | ICD-10-CM

## 2023-08-19 ENCOUNTER — Ambulatory Visit
Admission: RE | Admit: 2023-08-19 | Discharge: 2023-08-19 | Disposition: A | Discharge status: Home / Self Care | Payer: MEDICAID | Source: Ambulatory Visit | Attending: Internal Medicine | Admitting: Internal Medicine

## 2023-08-19 ENCOUNTER — Inpatient Hospital Stay
Admission: RE | Admit: 2023-08-19 | Discharge: 2023-08-19 | Discharge status: Home / Self Care | Payer: MEDICAID | Source: Ambulatory Visit | Attending: Internal Medicine | Admitting: Internal Medicine

## 2023-08-19 ENCOUNTER — Other Ambulatory Visit: Payer: Self-pay | Admitting: Internal Medicine

## 2023-08-19 DIAGNOSIS — N6312 Unspecified lump in the right breast, upper inner quadrant: Secondary | ICD-10-CM

## 2023-08-19 DIAGNOSIS — R928 Other abnormal and inconclusive findings on diagnostic imaging of breast: Secondary | ICD-10-CM

## 2023-08-19 HISTORY — PX: BREAST BIOPSY: SHX20

## 2023-08-21 LAB — SURGICAL PATHOLOGY

## 2023-09-04 ENCOUNTER — Encounter (HOSPITAL_BASED_OUTPATIENT_CLINIC_OR_DEPARTMENT_OTHER): Payer: MEDICAID | Admitting: Internal Medicine

## 2023-09-07 ENCOUNTER — Other Ambulatory Visit: Payer: Self-pay | Admitting: Internal Medicine

## 2023-09-07 ENCOUNTER — Ambulatory Visit
Admission: RE | Admit: 2023-09-07 | Discharge: 2023-09-07 | Disposition: A | Discharge status: Home / Self Care | Payer: MEDICAID | Source: Ambulatory Visit | Attending: Internal Medicine | Admitting: Internal Medicine

## 2023-09-07 ENCOUNTER — Ambulatory Visit (HOSPITAL_BASED_OUTPATIENT_CLINIC_OR_DEPARTMENT_OTHER): Payer: MEDICAID | Admitting: Internal Medicine

## 2023-09-07 DIAGNOSIS — R928 Other abnormal and inconclusive findings on diagnostic imaging of breast: Secondary | ICD-10-CM

## 2023-09-07 DIAGNOSIS — N6312 Unspecified lump in the right breast, upper inner quadrant: Secondary | ICD-10-CM

## 2023-09-07 DIAGNOSIS — G4733 Obstructive sleep apnea (adult) (pediatric): Secondary | ICD-10-CM

## 2023-09-10 ENCOUNTER — Ambulatory Visit (HOSPITAL_BASED_OUTPATIENT_CLINIC_OR_DEPARTMENT_OTHER): Payer: MEDICAID | Attending: Family Medicine | Admitting: Internal Medicine

## 2023-09-10 ENCOUNTER — Telehealth: Payer: Self-pay | Admitting: *Deleted

## 2023-09-10 VITALS — Ht 66.0 in | Wt 222.8 lb

## 2023-09-10 DIAGNOSIS — G4733 Obstructive sleep apnea (adult) (pediatric): Secondary | ICD-10-CM | POA: Diagnosis present

## 2023-09-10 NOTE — Telephone Encounter (Signed)
Patient contacted the office yesterday and spoke with Kristina Huffman regarding her referral. Patient was advised that her referral has been declined by our providers. Patient inquired as to why. Patient was advised that the providers had nothing to offer. Patient insisted on receiving a call from the clinic.   Reached out to patient today. Advised patient our providers review our referrals. Patient advised after reviewing her referral and her chart our providers did not feel they had anything they could offer to help with her condition she is being referred for. Patient advised she would need to be in contact with her referring doctor to have them send the referral to another physician. Patient got upset and said whatever then hung up.

## 2023-09-19 DIAGNOSIS — G4733 Obstructive sleep apnea (adult) (pediatric): Secondary | ICD-10-CM

## 2023-09-19 NOTE — Procedures (Signed)
     Patient Name: Kristina Huffman, Kristina Huffman Date: 09/10/2023 Gender: Female D.O.B: Dec 24, 1960 Age (years): 62 Referring Provider: Loney Laurence NP Height (inches): 66 Interpreting Physician: Jetty Duhamel MD, ABSM Weight (lbs): 222 RPSGT: Elaina Pattee BMI: 36 MRN: [id number] Neck Size: 17.50  CLINICAL INFORMATION Sleep Study Type: HST Indication for sleep study: Diabetes, Hypertension, Obesity, Snoring, Witnessed Apneas Epworth Sleepiness Score: 24   Most recent polysomnogram dated 12/02/2017 revealed an AHI of 97.1/h and RDI of 99.8/h. Most recent titration study dated 12/02/2017 was optimal at 10cm H2O with an AHI of 18.0/h.  SLEEP STUDY TECHNIQUE A multi-channel overnight portable sleep study was performed. The channels recorded were: nasal airflow, thoracic respiratory movement, and oxygen saturation with a pulse oximetry. Snoring was also monitored.  MEDICATIONS Patient self administered medications include: NEURONTIN, PROTONIX, SEROQUEL.  SLEEP ARCHITECTURE Patient was studied for 360.9 minutes. The sleep efficiency was 100.0 % and the patient was supine for 0%. The arousal index was 0.0 per hour.  RESPIRATORY PARAMETERS The overall AHI was 23.8 per hour, with a central apnea index of 0 per hour. The oxygen nadir was 85% during sleep.  CARDIAC DATA Mean heart rate during sleep was 59.6 bpm.  IMPRESSIONS - Moderate obstructive sleep apnea occurred during this study (AHI = 23.8/h). - Moderate oxygen desaturation was noted during this study (Min O2 = 85%, Mean 93%). - Patient snored.  DIAGNOSIS - Obstructive Sleep Apnea (G47.33)  RECOMMENDATIONS - Suggest CPAP titration sleep study or autopap. Other options would be based on clinical judgment. - Be careful with alcohol, sedatives and other CNS depressants that may worsen sleep apnea and disrupt normal sleep architecture. - Sleep hygiene should be reviewed to assess factors that may improve sleep quality. -  Weight management and regular exercise should be initiated or continued.  [Electronically signed] 09/19/2023 12:15 PM  Jetty Duhamel MD, ABSM Diplomate, American Board of Sleep Medicine NPI: [id number]                        Jetty Duhamel Diplomate, American Board of Sleep Medicine  ELECTRONICALLY SIGNED ON:  09/19/2023, 4:08 PM [facility] PH: [phone]   FX: [phone] ACCREDITED BY THE AMERICAN ACADEMY OF SLEEP MEDICINE

## 2023-09-19 NOTE — Procedures (Signed)
       Patient Name: Kristina Huffman, Kristina Huffman Date: 09/10/2023 Gender: Female D.O.B: [birth year] Age (years): 62 Referring Provider: Loney Laurence NP Height (inches): 66 Interpreting Physician: Jetty Duhamel MD, ABSM Weight (lbs): 222 RPSGT: Elaina Pattee BMI: 36 MRN: [id number] Neck Size: 17.50  CLINICAL INFORMATION Sleep Study Type: HST Indication for sleep study: Diabetes, Hypertension, Obesity, Snoring, Witnessed Apneas Epworth Sleepiness Score: 24   Most recent polysomnogram dated 12/02/2017 revealed an AHI of 97.1/h and RDI of 99.8/h. Most recent titration study dated 12/02/2017 was optimal at 10cm H2O with an AHI of 18.0/h.  SLEEP STUDY TECHNIQUE A multi-channel overnight portable sleep study was performed. The channels recorded were: nasal airflow, thoracic respiratory movement, and oxygen saturation with a pulse oximetry. Snoring was also monitored.  MEDICATIONS Patient self administered medications include: NEURONTIN, PROTONIX, SEROQUEL.  SLEEP ARCHITECTURE Patient was studied for 360.9 minutes. The sleep efficiency was 100.0 % and the patient was supine for 0%. The arousal index was 0.0 per hour.  RESPIRATORY PARAMETERS The overall AHI was 23.8 per hour, with a central apnea index of 0 per hour. The oxygen nadir was 85% during sleep.  CARDIAC DATA Mean heart rate during sleep was 59.6 bpm.  IMPRESSIONS - Moderate obstructive sleep apnea occurred during this study (AHI = 23.8/h). - Moderate oxygen desaturation was noted during this study (Min O2 = 85%, Mean 93%). - Patient snored.  DIAGNOSIS - Obstructive Sleep Apnea (G47.33)  RECOMMENDATIONS - Suggest CPAP titration sleep study or autopap. Other options would be based on clinical judgment. - Be careful with alcohol, sedatives and other CNS depressants that may worsen sleep apnea and disrupt normal sleep architecture. - Sleep hygiene should be reviewed to assess factors that may improve sleep  quality. - Weight management and regular exercise should be initiated or continued.  [Electronically signed] 09/19/2023 12:15 PM  Jetty Duhamel MD, ABSM Diplomate, American Board of Sleep Medicine NPI: [id number]                      Jetty Duhamel Diplomate, American Board of Sleep Medicine  ELECTRONICALLY SIGNED ON:  09/19/2023, 12:12 PM [facility] PH: [phone]   FX: [phone] ACCREDITED BY THE AMERICAN ACADEMY OF SLEEP MEDICINE

## 2023-09-30 ENCOUNTER — Telehealth: Payer: Self-pay | Admitting: Plastic Surgery

## 2023-09-30 ENCOUNTER — Institutional Professional Consult (permissible substitution): Payer: MEDICAID | Admitting: Plastic Surgery

## 2023-09-30 NOTE — Telephone Encounter (Signed)
 Pt is on her 3rd no show, Per provider pt will not be able to come to [facility] due the no shows in the office. Thank you

## 2023-10-15 ENCOUNTER — Other Ambulatory Visit: Payer: Self-pay | Admitting: Internal Medicine

## 2023-10-15 DIAGNOSIS — Z1231 Encounter for screening mammogram for malignant neoplasm of breast: Secondary | ICD-10-CM

## 2023-10-18 ENCOUNTER — Encounter (INDEPENDENT_AMBULATORY_CARE_PROVIDER_SITE_OTHER): Payer: Self-pay

## 2023-10-26 ENCOUNTER — Encounter: Payer: MEDICAID | Admitting: Obstetrics and Gynecology

## 2023-11-13 ENCOUNTER — Ambulatory Visit: Payer: Self-pay | Admitting: Surgery

## 2024-02-11 ENCOUNTER — Other Ambulatory Visit (HOSPITAL_COMMUNITY)
Admission: RE | Admit: 2024-02-11 | Discharge: 2024-02-11 | Disposition: A | Discharge status: Home / Self Care | Payer: MEDICAID | Source: Ambulatory Visit | Attending: Obstetrics & Gynecology | Admitting: Obstetrics & Gynecology

## 2024-02-11 ENCOUNTER — Ambulatory Visit: Payer: MEDICAID | Admitting: Obstetrics & Gynecology

## 2024-02-11 VITALS — BP 105/70 | HR 86 | Wt 219.0 lb

## 2024-02-11 DIAGNOSIS — Z01419 Encounter for gynecological examination (general) (routine) without abnormal findings: Secondary | ICD-10-CM | POA: Diagnosis present

## 2024-02-11 DIAGNOSIS — A630 Anogenital (venereal) warts: Secondary | ICD-10-CM

## 2024-02-11 DIAGNOSIS — Z113 Encounter for screening for infections with a predominantly sexual mode of transmission: Secondary | ICD-10-CM

## 2024-02-11 DIAGNOSIS — Z1331 Encounter for screening for depression: Secondary | ICD-10-CM | POA: Diagnosis not present
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
## 2024-02-12 LAB — CERVICOVAGINAL ANCILLARY ONLY
Chlamydia: NEGATIVE
Comment: NEGATIVE
Comment: NEGATIVE
Comment: NORMAL
Neisseria Gonorrhea: NEGATIVE
Trichomonas: NEGATIVE

## 2024-02-12 LAB — RPR+HBSAG+HCVAB+...
HIV Screen 4th Generation wRfx: NONREACTIVE
Hep C Virus Ab: REACTIVE — AB
Hepatitis B Surface Ag: NEGATIVE
RPR Ser Ql: NONREACTIVE

## 2024-02-18 LAB — CYTOLOGY - PAP
Comment: NEGATIVE
Diagnosis: NEGATIVE
High risk HPV: NEGATIVE

## 2024-03-03 ENCOUNTER — Other Ambulatory Visit: Payer: Self-pay | Admitting: Nurse Practitioner

## 2024-03-03 DIAGNOSIS — K760 Fatty (change of) liver, not elsewhere classified: Secondary | ICD-10-CM

## 2024-03-03 DIAGNOSIS — R748 Abnormal levels of other serum enzymes: Secondary | ICD-10-CM

## 2024-03-03 DIAGNOSIS — F10288 Alcohol dependence with other alcohol-induced disorder: Secondary | ICD-10-CM

## 2024-03-07 ENCOUNTER — Encounter: Payer: Self-pay | Admitting: Nurse Practitioner

## 2024-03-09 ENCOUNTER — Other Ambulatory Visit: Payer: MEDICAID

## 2024-03-10 ENCOUNTER — Ambulatory Visit (HOSPITAL_COMMUNITY): Admission: EM | Admit: 2024-03-10 | Discharge: 2024-03-10 | Discharge status: Against Medical Advice | Payer: MEDICAID

## 2024-03-10 ENCOUNTER — Encounter (HOSPITAL_COMMUNITY): Payer: Self-pay | Admitting: Emergency Medicine

## 2024-03-10 NOTE — ED Triage Notes (Signed)
 Started wegovy 2 months ago and started having breathlessness and told my doctor and he said no it not related. Feels like can't catch breath when breathes in deep. Had change in dose on Monday.

## 2024-03-10 NOTE — ED Notes (Signed)
 Pt not answering from lobby,

## 2024-03-14 ENCOUNTER — Ambulatory Visit
Admission: RE | Admit: 2024-03-14 | Discharge: 2024-03-14 | Disposition: A | Discharge status: Home / Self Care | Payer: MEDICAID | Source: Ambulatory Visit | Attending: Nurse Practitioner

## 2024-03-14 DIAGNOSIS — R748 Abnormal levels of other serum enzymes: Secondary | ICD-10-CM

## 2024-03-14 DIAGNOSIS — K760 Fatty (change of) liver, not elsewhere classified: Secondary | ICD-10-CM

## 2024-03-14 DIAGNOSIS — F10288 Alcohol dependence with other alcohol-induced disorder: Secondary | ICD-10-CM

## 2024-03-18 ENCOUNTER — Ambulatory Visit: Payer: MEDICAID | Admitting: Podiatry

## 2024-04-14 ENCOUNTER — Ambulatory Visit
Admission: RE | Admit: 2024-04-14 | Discharge: 2024-04-14 | Disposition: A | Discharge status: Home / Self Care | Payer: MEDICAID | Source: Ambulatory Visit | Attending: Internal Medicine | Admitting: Internal Medicine

## 2024-04-14 DIAGNOSIS — Z1231 Encounter for screening mammogram for malignant neoplasm of breast: Secondary | ICD-10-CM

## 2024-04-26 ENCOUNTER — Other Ambulatory Visit: Payer: Self-pay | Admitting: Internal Medicine

## 2024-04-26 ENCOUNTER — Ambulatory Visit: Payer: MEDICAID

## 2024-04-26 DIAGNOSIS — Z8249 Family history of ischemic heart disease and other diseases of the circulatory system: Secondary | ICD-10-CM

## 2024-04-28 ENCOUNTER — Ambulatory Visit: Payer: MEDICAID

## 2024-05-02 ENCOUNTER — Other Ambulatory Visit: Payer: MEDICAID

## 2024-05-02 ENCOUNTER — Telehealth: Payer: MEDICAID

## 2024-05-06 ENCOUNTER — Ambulatory Visit: Payer: MEDICAID

## 2024-05-08 ENCOUNTER — Encounter (INDEPENDENT_AMBULATORY_CARE_PROVIDER_SITE_OTHER): Payer: Self-pay

## 2024-05-09 ENCOUNTER — Other Ambulatory Visit: Payer: Self-pay

## 2024-05-09 ENCOUNTER — Emergency Department (HOSPITAL_BASED_OUTPATIENT_CLINIC_OR_DEPARTMENT_OTHER): Payer: MEDICAID

## 2024-05-09 ENCOUNTER — Encounter: Payer: Self-pay | Admitting: Internal Medicine

## 2024-05-09 ENCOUNTER — Emergency Department (HOSPITAL_BASED_OUTPATIENT_CLINIC_OR_DEPARTMENT_OTHER)
Admission: EM | Admit: 2024-05-09 | Discharge: 2024-05-09 | Discharge status: Against Medical Advice | Payer: MEDICAID | Source: Ambulatory Visit | Attending: Emergency Medicine | Admitting: Emergency Medicine

## 2024-05-09 ENCOUNTER — Encounter (HOSPITAL_BASED_OUTPATIENT_CLINIC_OR_DEPARTMENT_OTHER): Payer: Self-pay | Admitting: Emergency Medicine

## 2024-05-09 DIAGNOSIS — I10 Essential (primary) hypertension: Secondary | ICD-10-CM | POA: Insufficient documentation

## 2024-05-09 DIAGNOSIS — T887XXA Unspecified adverse effect of drug or medicament, initial encounter: Secondary | ICD-10-CM | POA: Insufficient documentation

## 2024-05-09 DIAGNOSIS — R1084 Generalized abdominal pain: Secondary | ICD-10-CM

## 2024-05-09 DIAGNOSIS — T50905A Adverse effect of unspecified drugs, medicaments and biological substances, initial encounter: Secondary | ICD-10-CM | POA: Insufficient documentation

## 2024-05-09 LAB — URINALYSIS, ROUTINE W REFLEX MICROSCOPIC
Bilirubin Urine: NEGATIVE
Glucose, UA: NEGATIVE mg/dL
Hgb urine dipstick: NEGATIVE
Ketones, ur: NEGATIVE mg/dL
Leukocytes,Ua: NEGATIVE
Nitrite: NEGATIVE
Protein, ur: NEGATIVE mg/dL
Specific Gravity, Urine: 1.015 (ref 1.005–1.030)
pH: 5.5 (ref 5.0–8.0)

## 2024-05-09 LAB — COMPREHENSIVE METABOLIC PANEL WITH GFR
ALT: 41 U/L (ref 0–44)
AST: 39 U/L (ref 15–41)
Albumin: 4.6 g/dL (ref 3.5–5.0)
Alkaline Phosphatase: 90 U/L (ref 38–126)
Anion gap: 17 — ABNORMAL HIGH (ref 5–15)
BUN: 17 mg/dL (ref 6–20)
CO2: 20 mmol/L — ABNORMAL LOW (ref 22–32)
Calcium: 9.7 mg/dL (ref 8.9–10.3)
Chloride: 100 mmol/L (ref 98–111)
Creatinine, Ser: 0.76 mg/dL (ref 0.44–1.00)
GFR, Estimated: >60 mL/min (ref >60)
Glucose, Bld: 104 mg/dL — ABNORMAL HIGH (ref 70–99)
Potassium: 3.8 mmol/L (ref 3.5–5.1)
Sodium: 137 mmol/L (ref 135–145)
Total Bilirubin: 0.8 mg/dL (ref 0.0–1.2)
Total Protein: 7.4 g/dL (ref 6.5–8.1)

## 2024-05-09 LAB — CBC
HCT: 42.9 % (ref 36.0–46.0)
Hemoglobin: 14.3 g/dL (ref 12.0–15.0)
MCH: 30.9 pg (ref 26.0–34.0)
MCHC: 33.3 g/dL (ref 30.0–36.0)
MCV: 92.7 fL (ref 80.0–100.0)
Platelets: 222 K/uL (ref 150–400)
RBC: 4.63 MIL/uL (ref 3.87–5.11)
RDW: 13.7 % (ref 11.5–15.5)
WBC: 5 K/uL (ref 4.0–10.5)
nRBC: 0 % (ref 0.0–0.2)

## 2024-05-09 LAB — LIPASE, BLOOD: Lipase: 32 U/L (ref 11–51)

## 2024-05-09 MED ORDER — HYDROCODONE-ACETAMINOPHEN 5-325 MG PO TABS
1.0000 | ORAL_TABLET | Freq: Once | ORAL | Status: DC
  Filled 2024-05-09: qty 1

## 2024-05-09 MED ORDER — IOHEXOL 350 MG/ML SOLN: 100.0000 mL | Freq: Once | INTRAVENOUS | Status: DC | PRN

## 2024-05-09 NOTE — ED Triage Notes (Signed)
 Abdo pain, swelling reported by pain Goes into back X 4 days  Not getting any better No appetite Weak  On wegovy, took day before pain started

## 2024-05-09 NOTE — ED Provider Notes (Signed)
 [place] EMERGENCY DEPARTMENT AT [facility] Provider Note   CSN: [id number] Arrival date & time: 05/09/24  1556     Patient presents with: Abdominal Pain   Kristina Huffman is a 63 y.o. female.    Abdominal Pain Associated symptoms: shortness of breath      63 year old female with medical history significant for depression, hepatitis C, EtOH abuse, anxiety, HTN, bipolar disorder, splenic artery aneurysm, GERD, PTSD presenting to the emergency department with mid back pain, shortness of breath.  The patient states that she just recently started [facility].  She has had abdominal pain as well in a generalized location.  No appetite, feels weak, got a Wegovy shot before her symptoms started.  Had previously stopped Wegovy due to symptoms of shortness of breath.  Pain in her mid back is located in the thoracic area, worse with deep breathing.  No cough, fevers or chills.  Prior to Admission medications   Medication Sig Start Date End Date Taking? Authorizing Provider  ACCU-CHEK GUIDE test strip test ONCE EVERY DAY 11/27/22   [provider]  Accu-Chek Softclix Lancets lancets daily. 11/14/22   [provider]  albuterol  (VENTOLIN  HFA) 108 (90 Base) MCG/ACT inhaler Inhale 2 puffs into the lungs every 6 (six) hours as needed for wheezing or shortness of breath. 11/13/22   Kennyth Domino, FNP  atorvastatin  (LIPITOR) 40 MG tablet Take 1 tablet (40 mg total) by mouth at bedtime. 04/09/22 02/11/24  Lynnette Barter, MD  Blood Glucose Monitoring Suppl (ACCU-CHEK GUIDE ME) w/Device KIT daily. as directed 10/23/22   [provider]  Cholecalciferol  (VITAMIN D3) 50 MCG (2000 UT) TABS Take 2,000 mcg by mouth daily. 04/09/22   Lynnette Barter, MD  colchicine  0.6 MG tablet Take 1 tablet (0.6 mg total) by mouth daily for 8 days. 07/20/23 07/28/23  Ward, Harlene Z, [name]  DENTA 5000 PLUS 1.1 % CREA dental cream Take by mouth 2 (two) times daily. Patient not taking: Reported on 02/11/2024  08/19/22   [provider]  FLUoxetine  (PROZAC ) 20 MG capsule Take 3 capsules (60 mg total) by mouth in the morning. 06/30/23   Cornelius Dines, MD  gabapentin  (NEURONTIN ) 600 MG tablet Take 600 mg by mouth 3 (three) times daily.    [provider]  hydrochlorothiazide  (HYDRODIURIL ) 25 MG tablet Take 1 tablet (25 mg total) by mouth daily. 04/09/22 02/11/24  Lynnette Barter, MD  naltrexone  (DEPADE) 50 MG tablet Take 1 tablet (50 mg total) by mouth at bedtime. If affecting sleep, okay to switch to taking 25 mg (1/2 tablet). Patient not taking: Reported on 02/11/2024 06/29/23   Cornelius Dines, MD  Omega-3 Fatty Acids (FISH OIL  PO) Take 1 capsule by mouth at bedtime.    [provider]  ondansetron  (ZOFRAN ) 4 MG tablet Take 4 mg by mouth every 6 (six) hours as needed for nausea or vomiting.    [provider]  pantoprazole  (PROTONIX ) 40 MG tablet Take 1 tablet (40 mg total) by mouth daily. Patient taking differently: Take 40 mg by mouth daily as needed (For heartburn or acid reflux). 11/17/22   Aneita Gwendlyn DASEN, MD  QUEtiapine  (SEROQUEL ) 200 MG tablet Take 1 tablet (200 mg total) by mouth at bedtime. 06/29/23   Cornelius Dines, MD  traZODone  (DESYREL ) 100 MG tablet Take 1 tablet (100 mg total) by mouth at bedtime. 06/29/23 08/28/23  Cornelius Dines, MD  WEGOVY 0.5 MG/0.5ML SOAJ Inject 0.5 mg into the skin. 03/04/24   [provider]  metFORMIN  (GLUCOPHAGE )  500 MG tablet Take 1 tablet (500 mg total) by mouth 2 (two) times daily with a meal. For diabetes management 06/09/18 11/30/19  Collene Gouge I, NP    Allergies: Sulfa antibiotics and Aspirin     Review of Systems  Respiratory:  Positive for shortness of breath.   Gastrointestinal:  Positive for abdominal pain.  Musculoskeletal:  Positive for back pain.  All other systems reviewed and are negative.   Updated Vital Signs BP 125/70   Pulse 74   Temp 98 F (36.7 C)   Resp 16   LMP 08/02/2013 Comment: irregular  SpO2  100%   Physical Exam Vitals and nursing note reviewed.  Constitutional:      General: She is not in acute distress.    Appearance: She is well-developed. She is obese.  HENT:     Head: Normocephalic and atraumatic.  Eyes:     Conjunctiva/sclera: Conjunctivae normal.  Cardiovascular:     Rate and Rhythm: Normal rate and regular rhythm.     Heart sounds: No murmur heard. Pulmonary:     Effort: Pulmonary effort is normal. No respiratory distress.     Breath sounds: Normal breath sounds.  Abdominal:     Palpations: Abdomen is soft.     Tenderness: There is generalized abdominal tenderness. There is no guarding or rebound.  Musculoskeletal:        General: No swelling.     Cervical back: Neck supple.  Skin:    General: Skin is warm and dry.     Capillary Refill: Capillary refill takes less than 2 seconds.  Neurological:     Mental Status: She is alert.  Psychiatric:        Mood and Affect: Mood normal.     (all labs ordered are listed, but only abnormal results are displayed) Labs Reviewed  COMPREHENSIVE METABOLIC PANEL WITH GFR - Abnormal; Notable for the following components:      Result Value   CO2 20 (*)    Glucose, Bld 104 (*)    Anion gap 17 (*)    All other components within normal limits  LIPASE, BLOOD  CBC  URINALYSIS, ROUTINE W REFLEX MICROSCOPIC    EKG: None  Radiology: No results found.   Procedures   Medications Ordered in the ED  HYDROcodone -acetaminophen  (NORCO/VICODIN) 5-325 MG per tablet 1 tablet (has no administration in time range)  iohexol  (OMNIPAQUE ) 350 MG/ML injection 100 mL (has no administration in time range)                                    Medical Decision Making Amount and/or Complexity of Data Reviewed Labs: ordered. Radiology: ordered.  Risk Prescription drug management.    63 year old female with medical history significant for depression, hepatitis C, EtOH abuse, anxiety, HTN, bipolar disorder, splenic artery  aneurysm, GERD, PTSD presenting to the emergency department with mid back pain, shortness of breath.  The patient states that she just recently started [facility].  She has had abdominal pain as well in a generalized location.  No appetite, feels weak, got a Wegovy shot before her symptoms started.  Had previously stopped Wegovy due to symptoms of shortness of breath.  Pain in her mid back is located in the thoracic area, worse with deep breathing.  No cough, fevers or chills.  On arrival, the patient was vitally stable.  Presented with possible medication side effect with symptoms of abdominal  pain, nausea, shortness of breath after starting [facility] shots.  Initial workup initiated to include screening labs to include CBC and CMP which revealed a mild anion gap acidosis with a bicarbonate of 20, anion gap of 17, lipase normal.  CBC revealed no leukocytosis or anemia.  UA was ordered and pending.  To further evaluate the patient's symptoms, CTA PE study and CT angio abdomen pelvis was ordered given her history of splenic artery aneurysm and abdominal pain.  I was subsequently informed by nursing that the patient eloped from the emergency department prior to completion of her workup.     Final diagnoses:  Generalized abdominal pain  Medication side effect    ED Discharge Orders     None          Jerrol Agent, MD 05/09/24 TRENNA

## 2024-05-09 NOTE — ED Notes (Signed)
 Went in to assess pt and she states she has to be picked up in 15 min and requests to leave.  Pt leaving AMA EDP made aware

## 2024-05-09 NOTE — ED Notes (Signed)
 Patient notified of need for urine sample to complete eval/assessment. Specimen cup provided and instructions for clean catch given. Patient will notify staff when able to provide

## 2024-05-10 ENCOUNTER — Other Ambulatory Visit: Payer: Self-pay

## 2024-05-10 ENCOUNTER — Emergency Department (HOSPITAL_BASED_OUTPATIENT_CLINIC_OR_DEPARTMENT_OTHER)
Admission: EM | Admit: 2024-05-10 | Discharge: 2024-05-10 | Disposition: A | Discharge status: Home / Self Care | Payer: MEDICAID | Attending: Emergency Medicine | Admitting: Emergency Medicine

## 2024-05-10 ENCOUNTER — Emergency Department (HOSPITAL_BASED_OUTPATIENT_CLINIC_OR_DEPARTMENT_OTHER): Payer: MEDICAID

## 2024-05-10 ENCOUNTER — Encounter (HOSPITAL_BASED_OUTPATIENT_CLINIC_OR_DEPARTMENT_OTHER): Payer: Self-pay

## 2024-05-10 DIAGNOSIS — Z7984 Long term (current) use of oral hypoglycemic drugs: Secondary | ICD-10-CM | POA: Insufficient documentation

## 2024-05-10 DIAGNOSIS — R1012 Left upper quadrant pain: Secondary | ICD-10-CM | POA: Diagnosis present

## 2024-05-10 DIAGNOSIS — K529 Noninfective gastroenteritis and colitis, unspecified: Secondary | ICD-10-CM | POA: Insufficient documentation

## 2024-05-10 DIAGNOSIS — E119 Type 2 diabetes mellitus without complications: Secondary | ICD-10-CM | POA: Diagnosis not present

## 2024-05-10 LAB — CBC
HCT: 42.1 % (ref 36.0–46.0)
Hemoglobin: 14.2 g/dL (ref 12.0–15.0)
MCH: 30.7 pg (ref 26.0–34.0)
MCHC: 33.7 g/dL (ref 30.0–36.0)
MCV: 90.9 fL (ref 80.0–100.0)
Platelets: 242 K/uL (ref 150–400)
RBC: 4.63 MIL/uL (ref 3.87–5.11)
RDW: 13.4 % (ref 11.5–15.5)
WBC: 5.3 K/uL (ref 4.0–10.5)
nRBC: 0 % (ref 0.0–0.2)

## 2024-05-10 LAB — COMPREHENSIVE METABOLIC PANEL WITH GFR
ALT: 43 U/L (ref 0–44)
AST: 39 U/L (ref 15–41)
Albumin: 4.6 g/dL (ref 3.5–5.0)
Alkaline Phosphatase: 96 U/L (ref 38–126)
Anion gap: 19 — ABNORMAL HIGH (ref 5–15)
BUN: 17 mg/dL (ref 6–20)
CO2: 20 mmol/L — ABNORMAL LOW (ref 22–32)
Calcium: 10.3 mg/dL (ref 8.9–10.3)
Chloride: 97 mmol/L — ABNORMAL LOW (ref 98–111)
Creatinine, Ser: 0.74 mg/dL (ref 0.44–1.00)
GFR, Estimated: >60 mL/min (ref >60)
Glucose, Bld: 125 mg/dL — ABNORMAL HIGH (ref 70–99)
Potassium: 3.9 mmol/L (ref 3.5–5.1)
Sodium: 136 mmol/L (ref 135–145)
Total Bilirubin: 0.7 mg/dL (ref 0.0–1.2)
Total Protein: 7.4 g/dL (ref 6.5–8.1)

## 2024-05-10 LAB — URINALYSIS, ROUTINE W REFLEX MICROSCOPIC
Bilirubin Urine: NEGATIVE
Glucose, UA: NEGATIVE mg/dL
Hgb urine dipstick: NEGATIVE
Ketones, ur: NEGATIVE mg/dL
Leukocytes,Ua: NEGATIVE
Nitrite: NEGATIVE
Protein, ur: NEGATIVE mg/dL
Specific Gravity, Urine: <1.005 — ABNORMAL LOW (ref 1.005–1.030)
pH: 6 (ref 5.0–8.0)

## 2024-05-10 LAB — LIPASE, BLOOD: Lipase: 35 U/L (ref 11–51)

## 2024-05-10 MED ORDER — KETOROLAC TROMETHAMINE 15 MG/ML IJ SOLN
15.0000 mg | Freq: Once | INTRAMUSCULAR | Status: AC
  Administered 2024-05-10: 15 mg via INTRAVENOUS
  Filled 2024-05-10: qty 1

## 2024-05-10 MED ORDER — IBUPROFEN 600 MG PO TABS: 600.0000 mg | ORAL_TABLET | Freq: Four times a day (QID) | ORAL | 0 refills | Status: AC | PRN

## 2024-05-10 MED ORDER — LOPERAMIDE HCL 2 MG PO CAPS
2.0000 mg | ORAL_CAPSULE | Freq: Four times a day (QID) | ORAL | 0 refills | Status: AC | PRN
Start: 2024-05-10 — End: ?

## 2024-05-10 MED ORDER — IOHEXOL 350 MG/ML SOLN
100.0000 mL | Freq: Once | INTRAVENOUS | Status: AC | PRN
  Administered 2024-05-10: 100 mL via INTRAVENOUS

## 2024-05-10 NOTE — ED Triage Notes (Signed)
 Patient reports that she was here yesterday for abdominal pain but had to leave due to not having a ride. She reports that they wanted to do a CT scan but she left prior. She reports the pain is still there. Patient reports abdominal pain that radiates around the right flank into the back.

## 2024-05-10 NOTE — ED Provider Notes (Signed)
 [place] EMERGENCY DEPARTMENT AT [facility] Provider Note   CSN: [id number] Arrival date & time: 05/10/24  [id number]     Patient presents with: Abdominal Pain   Kristina Huffman is a 63 y.o. female who presents to the ED today for continued abdominal pain after having been seen in this ED yesterday for the same.  She had eloped from the ED secondary to transportation concerns, however presents today stating that she has continued pain and stated it actually is slightly increased from before.  Abdominal pain is largely generalized however she states that there is increased discomfort to the left upper quadrant as well as to the right lower back.  This is exacerbated with deep inspiration.  Further endorses a decreased appetite, generalized weakness, endorses that she has been on stable Wegovy dosing, last dose 3 days prior.  Workup conducted previously did not show any acute lab abnormalities, lipase was within normal limits as is the liver enzymes.  Urinalysis did not show any acute abnormalities, and the CBC did not show any acute elevations suggestive of acute infection.    Abdominal Pain Associated symptoms: nausea        Prior to Admission medications   Medication Sig Start Date End Date Taking? Authorizing Provider  busPIRone  (BUSPAR ) 5 MG tablet Take 5 mg by mouth 3 (three) times daily. 03/31/24  Yes [provider]  ibuprofen  (ADVIL ) 600 MG tablet Take 1 tablet (600 mg total) by mouth every 6 (six) hours as needed. 05/10/24  Yes Myriam Dorn BROCKS, PA  loperamide  (IMODIUM ) 2 MG capsule Take 1 capsule (2 mg total) by mouth 4 (four) times daily as needed for diarrhea or loose stools. 05/10/24  Yes Myriam Dorn BROCKS, PA  meloxicam (MOBIC) 15 MG tablet Take 15 mg by mouth daily as needed. 11/09/23  Yes [provider]  ACCU-CHEK GUIDE test strip test ONCE EVERY DAY 11/27/22   [provider]  Accu-Chek Softclix Lancets lancets daily. 11/14/22   [provider]  albuterol  (VENTOLIN  HFA) 108 (90 Base) MCG/ACT inhaler Inhale 2 puffs into the lungs every 6 (six) hours as needed for wheezing or shortness of breath. 11/13/22   Kennyth Domino, FNP  atorvastatin  (LIPITOR) 40 MG tablet Take 1 tablet (40 mg total) by mouth at bedtime. 04/09/22 02/11/24  Lynnette Barter, MD  Blood Glucose Monitoring Suppl (ACCU-CHEK GUIDE ME) w/Device KIT daily. as directed 10/23/22   [provider]  Cholecalciferol  (VITAMIN D3) 50 MCG (2000 UT) TABS Take 2,000 mcg by mouth daily. 04/09/22   Lynnette Barter, MD  colchicine  0.6 MG tablet Take 1 tablet (0.6 mg total) by mouth daily for 8 days. 07/20/23 07/28/23  Ward, Harlene Z, [name]  DENTA 5000 PLUS 1.1 % CREA dental cream Take by mouth 2 (two) times daily. Patient not taking: Reported on 02/11/2024 08/19/22   [provider]  FLUoxetine  (PROZAC ) 20 MG capsule Take 3 capsules (60 mg total) by mouth in the morning. 06/30/23   Cornelius Dines, MD  gabapentin  (NEURONTIN ) 600 MG tablet Take 600 mg by mouth 3 (three) times daily.    [provider]  hydrochlorothiazide  (HYDRODIURIL ) 25 MG tablet Take 1 tablet (25 mg total) by mouth daily. 04/09/22 02/11/24  Lynnette Barter, MD  naltrexone  (DEPADE) 50 MG tablet Take 1 tablet (50 mg total) by mouth at bedtime. If affecting sleep, okay to switch to taking 25 mg (1/2 tablet). Patient not taking: Reported on 02/11/2024 06/29/23   Cornelius Dines, MD  Omega-3 Fatty Acids (  FISH OIL  PO) Take 1 capsule by mouth at bedtime.    [provider]  ondansetron  (ZOFRAN ) 4 MG tablet Take 4 mg by mouth every 6 (six) hours as needed for nausea or vomiting.    [provider]  pantoprazole  (PROTONIX ) 40 MG tablet Take 1 tablet (40 mg total) by mouth daily. Patient taking differently: Take 40 mg by mouth daily as needed (For heartburn or acid reflux). 11/17/22   Aneita Gwendlyn DASEN, MD  QUEtiapine  (SEROQUEL ) 200 MG tablet Take 1 tablet (200 mg total) by mouth at bedtime. 06/29/23    Cornelius Dines, MD  traZODone  (DESYREL ) 100 MG tablet Take 1 tablet (100 mg total) by mouth at bedtime. 06/29/23 08/28/23  Cornelius Dines, MD  WEGOVY 0.5 MG/0.5ML SOAJ Inject 0.5 mg into the skin. 03/04/24   [provider]  metFORMIN  (GLUCOPHAGE ) 500 MG tablet Take 1 tablet (500 mg total) by mouth 2 (two) times daily with a meal. For diabetes management 06/09/18 11/30/19  Collene Gouge I, NP    Allergies: Sulfa antibiotics and Aspirin     Review of Systems  Constitutional:  Positive for appetite change.  Gastrointestinal:  Positive for abdominal pain and nausea.  All other systems reviewed and are negative.   Updated Vital Signs BP 125/76 (BP Location: Right Arm)   Pulse 78   Temp 98.5 F (36.9 C) (Oral)   Resp 18   LMP 08/02/2013 Comment: irregular  SpO2 93%   Physical Exam Vitals and nursing note reviewed.  Constitutional:      General: She is not in acute distress.    Appearance: Normal appearance.  HENT:     Head: Normocephalic and atraumatic.     Mouth/Throat:     Mouth: Mucous membranes are moist.     Pharynx: Oropharynx is clear.  Eyes:     Extraocular Movements: Extraocular movements intact.     Conjunctiva/sclera: Conjunctivae normal.     Pupils: Pupils are equal, round, and reactive to light.  Cardiovascular:     Rate and Rhythm: Normal rate and regular rhythm.     Pulses: Normal pulses.     Heart sounds: Normal heart sounds, S1 normal and S2 normal. No murmur heard.    No friction rub. No gallop.  Pulmonary:     Effort: Pulmonary effort is normal.     Breath sounds: Normal breath sounds.  Abdominal:     General: Abdomen is flat. Bowel sounds are normal.     Palpations: Abdomen is soft.     Tenderness: There is generalized abdominal tenderness. There is no right CVA tenderness or left CVA tenderness.  Musculoskeletal:        General: Normal range of motion.     Cervical back: Normal range of motion and neck supple.     Right lower leg: No edema.      Left lower leg: No edema.  Skin:    General: Skin is warm and dry.     Capillary Refill: Capillary refill takes less than 2 seconds.  Neurological:     General: No focal deficit present.     Mental Status: She is alert. Mental status is at baseline.  Psychiatric:        Mood and Affect: Mood normal.     (all labs ordered are listed, but only abnormal results are displayed) Labs Reviewed  COMPREHENSIVE METABOLIC PANEL WITH GFR - Abnormal; Notable for the following components:      Result Value   Chloride 97 (*)  CO2 20 (*)    Glucose, Bld 125 (*)    Anion gap 19 (*)    All other components within normal limits  URINALYSIS, ROUTINE W REFLEX MICROSCOPIC - Abnormal; Notable for the following components:   Color, Urine COLORLESS (*)    Specific Gravity, Urine <1.005 (*)    All other components within normal limits  LIPASE, BLOOD  CBC    EKG: None  Radiology: CT ABDOMEN PELVIS W CONTRAST Result Date: 05/10/2024 CLINICAL DATA:  Acute abdominal pain EXAM: CT ABDOMEN AND PELVIS WITH CONTRAST TECHNIQUE: Multidetector CT imaging of the abdomen and pelvis was performed using the standard protocol following bolus administration of intravenous contrast. RADIATION DOSE REDUCTION: This exam was performed according to the departmental dose-optimization program which includes automated exposure control, adjustment of the mA and/or kV according to patient size and/or use of iterative reconstruction technique. CONTRAST:  OMNIPAQUE  IOHEXOL  350 MG/ML SOLN COMPARISON:  CT abdomen and pelvis 04/11/2020. FINDINGS: Lower chest: No acute abnormality. Hepatobiliary: The liver is mildly enlarged. There is diffuse fatty infiltration of the liver. There is a cyst in the left lobe of the liver measuring 2.4 cm gallbladder and bile ducts are within normal limits. Pancreas: Unremarkable. No pancreatic ductal dilatation or surrounding inflammatory changes. Spleen: Normal in size without focal abnormality.  Adrenals/Urinary Tract: There subcentimeter hypodensities in the right kidney which are too small to characterize, likely cysts. Otherwise, the kidneys, adrenal glands and bladder are within normal limits. Stomach/Bowel: Stomach is within normal limits. Appendix appears normal. No evidence of bowel wall thickening, distention, or inflammatory changes. There are scattered air-fluid levels throughout the colon. Vascular/Lymphatic: No significant vascular findings are present. No enlarged abdominal or pelvic lymph nodes. Reproductive: Uterus and bilateral adnexa are unremarkable. Other: No abdominal wall hernia or abnormality. No abdominopelvic ascites. Musculoskeletal: Degenerative changes affect the spine. IMPRESSION: 1. Scattered air-fluid levels throughout the colon can be seen in the setting of diarrheal illness. 2. Hepatomegaly with fatty infiltration of the liver. Electronically Signed   By: Greig Pique M.D.   On: 05/10/2024 17:53   CT Angio Chest PE W/Cm &/Or Wo Cm Result Date: 05/10/2024 CLINICAL DATA:  Abdominal pain radiating to right flank and back EXAM: CT ANGIOGRAPHY CHEST WITH CONTRAST TECHNIQUE: Multidetector CT imaging of the chest was performed using the standard protocol during bolus administration of intravenous contrast. Multiplanar CT image reconstructions and MIPs were obtained to evaluate the vascular anatomy. RADIATION DOSE REDUCTION: This exam was performed according to the departmental dose-optimization program which includes automated exposure control, adjustment of the mA and/or kV according to patient size and/or use of iterative reconstruction technique. CONTRAST:  OMNIPAQUE  IOHEXOL  350 MG/ML SOLN COMPARISON:  10/12/2017 FINDINGS: Cardiovascular: This is a technically adequate evaluation of the pulmonary vasculature. No filling defects or pulmonary emboli. The heart is unremarkable without pericardial effusion. No evidence of thoracic aortic aneurysm or dissection.  Mediastinum/Nodes: No enlarged mediastinal, hilar, or axillary lymph nodes. Thyroid  gland, trachea, and esophagus demonstrate no significant findings. Lungs/Pleura: No acute airspace disease, effusion, or pneumothorax. The central airways are patent. Upper Abdomen: No acute abnormality. Musculoskeletal: No acute or destructive bony abnormalities. Reconstructed images demonstrate no additional findings. Review of the MIP images confirms the above findings. IMPRESSION: 1. No evidence of pulmonary embolus. 2. No acute intrathoracic process. Electronically Signed   By: Ozell Daring M.D.   On: 05/10/2024 17:50     Procedures   Medications Ordered in the ED  iohexol  (OMNIPAQUE ) 350 MG/ML injection 100  mL (100 mLs Intravenous Contrast Given 05/10/24 1734)  ketorolac  (TORADOL ) 15 MG/ML injection 15 mg (15 mg Intravenous Given 05/10/24 1811)                                    Medical Decision Making Amount and/or Complexity of Data Reviewed Labs: ordered. Radiology: ordered.  Risk Prescription drug management.   Medical Decision Making:   Sreshta Cressler is a 63 y.o. female who presented to the ED today with abdominal pain detailed above.    External chart has been reviewed including previous labs and imaging. Patient placed on continuous vitals and telemetry monitoring while in ED which was reviewed periodically.  Complete initial physical exam performed, notably the patient  was alert and oriented in no apparent distress.  Notable exam finding of tenderness to the abdomen that is elicited with light to moderate palpation.  Otherwise abdominal exam is unremarkable with normal present bowel sounds..    Reviewed and confirmed nursing documentation for past medical history, family history, social history.    Initial Assessment:   With the patient's presentation of abdominal pain, differential exam extends to acute pulmonary infection, gastroenteritis, pancreatitis, hepatobiliary disease,  diverticulitis.     Initial Plan:  Obtain CT of the abdomen and pelvis to assess for possible gastrointestinal etiology of her pain. Further evaluate CTA PE study to rule out acute pulmonary embolism secondary to previous history of splenic artery aneurysm. Screening labs including CBC and Metabolic panel to evaluate for infectious or metabolic etiology of disease.  Includes they are in lipase to evaluate pancreatic pathology. Urinalysis with reflex culture ordered to evaluate for UTI or relevant urologic/nephrologic pathology.  Provide patient with ketorolac  for abdominal pain. Objective evaluation as below reviewed   Initial Study Results:   Laboratory  All laboratory results reviewed without evidence of clinically relevant pathology.   Exceptions include: Anion gap is mildly elevated at 19 with a bicarb of 20 and chloride of 97.   Radiology:  All images reviewed independently. Agree with radiology report at this time.   CT ABDOMEN PELVIS W CONTRAST Result Date: 05/10/2024 CLINICAL DATA:  Acute abdominal pain EXAM: CT ABDOMEN AND PELVIS WITH CONTRAST TECHNIQUE: Multidetector CT imaging of the abdomen and pelvis was performed using the standard protocol following bolus administration of intravenous contrast. RADIATION DOSE REDUCTION: This exam was performed according to the departmental dose-optimization program which includes automated exposure control, adjustment of the mA and/or kV according to patient size and/or use of iterative reconstruction technique. CONTRAST:  OMNIPAQUE  IOHEXOL  350 MG/ML SOLN COMPARISON:  CT abdomen and pelvis 04/11/2020. FINDINGS: Lower chest: No acute abnormality. Hepatobiliary: The liver is mildly enlarged. There is diffuse fatty infiltration of the liver. There is a cyst in the left lobe of the liver measuring 2.4 cm gallbladder and bile ducts are within normal limits. Pancreas: Unremarkable. No pancreatic ductal dilatation or surrounding inflammatory  changes. Spleen: Normal in size without focal abnormality. Adrenals/Urinary Tract: There subcentimeter hypodensities in the right kidney which are too small to characterize, likely cysts. Otherwise, the kidneys, adrenal glands and bladder are within normal limits. Stomach/Bowel: Stomach is within normal limits. Appendix appears normal. No evidence of bowel wall thickening, distention, or inflammatory changes. There are scattered air-fluid levels throughout the colon. Vascular/Lymphatic: No significant vascular findings are present. No enlarged abdominal or pelvic lymph nodes. Reproductive: Uterus and bilateral adnexa are unremarkable. Other: No abdominal wall hernia or  abnormality. No abdominopelvic ascites. Musculoskeletal: Degenerative changes affect the spine. IMPRESSION: 1. Scattered air-fluid levels throughout the colon can be seen in the setting of diarrheal illness. 2. Hepatomegaly with fatty infiltration of the liver. Electronically Signed   By: Greig Pique M.D.   On: 05/10/2024 17:53   CT Angio Chest PE W/Cm &/Or Wo Cm Result Date: 05/10/2024 CLINICAL DATA:  Abdominal pain radiating to right flank and back EXAM: CT ANGIOGRAPHY CHEST WITH CONTRAST TECHNIQUE: Multidetector CT imaging of the chest was performed using the standard protocol during bolus administration of intravenous contrast. Multiplanar CT image reconstructions and MIPs were obtained to evaluate the vascular anatomy. RADIATION DOSE REDUCTION: This exam was performed according to the departmental dose-optimization program which includes automated exposure control, adjustment of the mA and/or kV according to patient size and/or use of iterative reconstruction technique. CONTRAST:  OMNIPAQUE  IOHEXOL  350 MG/ML SOLN COMPARISON:  10/12/2017 FINDINGS: Cardiovascular: This is a technically adequate evaluation of the pulmonary vasculature. No filling defects or pulmonary emboli. The heart is unremarkable without pericardial effusion. No  evidence of thoracic aortic aneurysm or dissection. Mediastinum/Nodes: No enlarged mediastinal, hilar, or axillary lymph nodes. Thyroid  gland, trachea, and esophagus demonstrate no significant findings. Lungs/Pleura: No acute airspace disease, effusion, or pneumothorax. The central airways are patent. Upper Abdomen: No acute abnormality. Musculoskeletal: No acute or destructive bony abnormalities. Reconstructed images demonstrate no additional findings. Review of the MIP images confirms the above findings. IMPRESSION: 1. No evidence of pulmonary embolus. 2. No acute intrathoracic process. Electronically Signed   By: Ozell Daring M.D.   On: 05/10/2024 17:50    Reassessment and Plan:   Review of the labs showed no appreciable changes from workup completed yesterday, with CMP showing normal liver enzymes, normal kidney function, did appreciate a small increase in the anion gap to 19.  Urine is unremarkable, white blood count is within normal limits.  CT imaging of the abdomen and pelvis did show air-fluid levels that was indicative of a likely diarrheal illness.  As such, will begin management for gastroenteritis as outpatient with loperamide  as needed and using over-the-counter medications as needed for relief of her pain.  As her workup is reassuring at this time, the CTA did not show any acute pulmonary emboli nor did show any acute infectious process of the chest.  Further the CT of the abdomen only showed air-fluid levels consistent with acute diarrheal illness.  Rest of the workup is reassuring, given this CT is fine she is stable for discharge at this time.  Recommend follow-up to primary care within the next 2 weeks.  She understands, agrees with this and has no further concerns at this time.       Final diagnoses:  Gastroenteritis    ED Discharge Orders          Ordered    loperamide  (IMODIUM ) 2 MG capsule  4 times daily PRN        05/10/24 1824    ibuprofen  (ADVIL ) 600 MG tablet  Every 6  hours PRN        05/10/24 1824               Myriam Dorn BROCKS, PA 05/10/24 PERVIS    Jerrol Agent, MD 05/10/24 1910

## 2024-05-10 NOTE — ED Notes (Signed)
 Unable to void at this time. Pt reminded urine sample needed, verbalized understanding. Will try again.

## 2024-05-11 ENCOUNTER — Ambulatory Visit
Admission: RE | Admit: 2024-05-11 | Discharge: 2024-05-11 | Disposition: A | Discharge status: Home / Self Care | Payer: MEDICAID | Source: Ambulatory Visit | Attending: Internal Medicine | Admitting: Internal Medicine

## 2024-05-11 DIAGNOSIS — Z8249 Family history of ischemic heart disease and other diseases of the circulatory system: Secondary | ICD-10-CM

## 2024-05-13 ENCOUNTER — Telehealth: Payer: MEDICAID

## 2024-05-19 ENCOUNTER — Ambulatory Visit: Admission: RE | Admit: 2024-05-19 | Payer: MEDICAID | Source: Ambulatory Visit

## 2024-06-29 ENCOUNTER — Encounter (HOSPITAL_COMMUNITY): Payer: Self-pay

## 2024-06-29 ENCOUNTER — Ambulatory Visit (HOSPITAL_COMMUNITY)
Admission: RE | Admit: 2024-06-29 | Discharge: 2024-06-29 | Disposition: A | Discharge status: Home / Self Care | Payer: MEDICAID | Source: Ambulatory Visit | Attending: Physician Assistant | Admitting: Physician Assistant

## 2024-06-29 DIAGNOSIS — S91331A Puncture wound without foreign body, right foot, initial encounter: Secondary | ICD-10-CM

## 2024-06-29 DIAGNOSIS — B0089 Other herpesviral infection: Secondary | ICD-10-CM

## 2024-06-29 MED ORDER — CEPHALEXIN 500 MG PO CAPS: 500.0000 mg | ORAL_CAPSULE | Freq: Four times a day (QID) | ORAL | 0 refills | Status: AC

## 2024-06-29 MED ORDER — VALACYCLOVIR HCL 1 G PO TABS: 1000.0000 mg | ORAL_TABLET | Freq: Two times a day (BID) | ORAL | 1 refills | Status: AC

## 2024-06-29 NOTE — Discharge Instructions (Addendum)
 Soak your foot 20 minutes 4 times a day.  Take antibiotic as directed.  Take valacyclovir if blisters return

## 2024-06-29 NOTE — ED Triage Notes (Signed)
 Patient reports that she developed blisters to the left pointer and middle fingers 2 weeks ago. Patient states she was prescribed Valcyclovir and the blisters remain on the fingers, but some better. Patient states she also has an blister to the left nostril 2 days ago. Patient states the area are very sore and tender. Patient states she has been putting Vaseline to the area.  Patient also reports that she stepped on something at home in the yard 2-3 days ago and has a small area under the  right 2nd toe that patient states is very sore.

## 2024-06-29 NOTE — ED Provider Notes (Signed)
 [facility]    CSN: [id number] Arrival date & time: 06/29/24  1553      History   Chief Complaint Chief Complaint  Patient presents with   Skin Ulcer    skin condition not getting any better and I stepped on something now its very painful and slightly swollen - Entered by patient    HPI Kristina Huffman is a 63 y.o. female.   Patient complains of having blisters on her fingers on her left hand.  Patient states that she also had a sore in her nose.  Patient also reports stepping on something in her yard and having a painful swollen area on her right foot.  Patient thinks that she may have something in her foot.  Patient denies any fever or chills.  Patient states that she had a prescription for Valtrex and thought that the sores on her fingers and her nose could be herpetic.  Patient looked up the sores and thought that they looked like herpes whitlow.  The history is provided by the patient. No language interpreter was used.    Past Medical History:  Diagnosis Date   Aneurysm of splenic artery    Anxiety    Asthma    Bipolar disorder (HCC)    Depression    Elevated LFTs    ETOH abuse    GERD (gastroesophageal reflux disease)    Gout    Hepatitis C    History of MRSA infection    legs and spread to face   Hypertension    Insomnia    Mixed hyperlipidemia    OSA (obstructive sleep apnea)    Sleep apnea     Patient Active Problem List   Diagnosis Date Noted   Warts, genital 02/11/2024   Alcohol  abuse 06/24/2023   Alcohol  addiction (HCC) 04/07/2022   Obstructive sleep apnea syndrome 05/08/2020   Acute respiratory failure due to COVID-19 (HCC) 10/14/2019   ARF (acute renal failure) 10/14/2019   Essential hypertension 10/14/2019   Controlled type 2 diabetes mellitus with hyperglycemia (HCC) 10/14/2019   Alcohol  use disorder, severe, dependence (HCC) 06/06/2018   Deviated nasal septum 12/03/2017   Mixed hyperlipidemia 11/29/2017   Gastroesophageal reflux  disease 10/06/2017   Chronic hepatitis C without hepatic coma (HCC) 06/04/2016   Benzodiazepine dependence (HCC) 09/21/2011   Bipolar 1 disorder, mixed, moderate (HCC) 09/21/2011   PTSD (post-traumatic stress disorder) 09/21/2011    Past Surgical History:  Procedure Laterality Date   BACK SURGERY     BREAST BIOPSY Right 08/19/2023   US  RT BREAST BX W LOC DEV 1ST LESION IMG BX SPEC US  GUIDE 08/19/2023 [facility]   RADIAL HEAD ARTHROPLASTY  08/09/2012   Procedure: RADIAL HEAD ARTHROPLASTY;  Surgeon: Donnice DELENA Robinsons, MD;  Location: [facility];  Service: Orthopedics;  Laterality: Left;  Left Radial head Replacement    OB History     Gravida  8   Para  4   Term  4   Preterm      AB  4   Living  4      SAB  1   IAB  3   Ectopic      Multiple      Live Births  4            Home Medications    Prior to Admission medications   Medication Sig Start Date End Date Taking? Authorizing Provider  cephALEXin  (KEFLEX ) 500 MG capsule Take 1 capsule (500 mg total) by  mouth 4 (four) times daily. 06/29/24  Yes Jameer Storie K, [name]  valACYclovir (VALTREX) 1000 MG tablet Take 1 tablet (1,000 mg total) by mouth 2 (two) times daily. 06/29/24  Yes Flint Sonny POUR, [name]  ACCU-CHEK GUIDE test strip test ONCE EVERY DAY 11/27/22   [provider]  Accu-Chek Softclix Lancets lancets daily. 11/14/22   [provider]  albuterol  (VENTOLIN  HFA) 108 (90 Base) MCG/ACT inhaler Inhale 2 puffs into the lungs every 6 (six) hours as needed for wheezing or shortness of breath. 11/13/22   Kennyth Domino, FNP  atorvastatin  (LIPITOR) 40 MG tablet Take 1 tablet (40 mg total) by mouth at bedtime. 04/09/22 02/11/24  Lynnette Barter, MD  Blood Glucose Monitoring Suppl (ACCU-CHEK GUIDE ME) w/Device KIT daily. as directed 10/23/22   [provider]  busPIRone  (BUSPAR ) 5 MG tablet Take 5 mg by mouth 3 (three) times daily. 03/31/24   [provider]  Cholecalciferol  (VITAMIN D3) 50  MCG (2000 UT) TABS Take 2,000 mcg by mouth daily. 04/09/22   Lynnette Barter, MD  colchicine  0.6 MG tablet Take 1 tablet (0.6 mg total) by mouth daily for 8 days. Patient not taking: Reported on 06/29/2024 07/20/23 07/28/23  Ward, Harlene Z, [name]  DENTA 5000 PLUS 1.1 % CREA dental cream Take by mouth 2 (two) times daily. Patient not taking: Reported on 02/11/2024 08/19/22   [provider]  FLUoxetine  (PROZAC ) 20 MG capsule Take 3 capsules (60 mg total) by mouth in the morning. 06/30/23   Cornelius Dines, MD  gabapentin  (NEURONTIN ) 600 MG tablet Take 600 mg by mouth 3 (three) times daily.    [provider]  hydrochlorothiazide  (HYDRODIURIL ) 25 MG tablet Take 1 tablet (25 mg total) by mouth daily. 04/09/22 02/11/24  Lynnette Barter, MD  ibuprofen  (ADVIL ) 600 MG tablet Take 1 tablet (600 mg total) by mouth every 6 (six) hours as needed. 05/10/24   Myriam Dorn BROCKS, PA  loperamide  (IMODIUM ) 2 MG capsule Take 1 capsule (2 mg total) by mouth 4 (four) times daily as needed for diarrhea or loose stools. Patient not taking: Reported on 06/29/2024 05/10/24   Myriam Dorn BROCKS, PA  meloxicam (MOBIC) 15 MG tablet Take 15 mg by mouth daily as needed. 11/09/23   [provider]  naltrexone  (DEPADE) 50 MG tablet Take 1 tablet (50 mg total) by mouth at bedtime. If affecting sleep, okay to switch to taking 25 mg (1/2 tablet). Patient not taking: Reported on 02/11/2024 06/29/23   Cornelius Dines, MD  Omega-3 Fatty Acids (FISH OIL  PO) Take 1 capsule by mouth at bedtime.    [provider]  ondansetron  (ZOFRAN ) 4 MG tablet Take 4 mg by mouth every 6 (six) hours as needed for nausea or vomiting.    [provider]  pantoprazole  (PROTONIX ) 40 MG tablet Take 1 tablet (40 mg total) by mouth daily. Patient taking differently: Take 40 mg by mouth daily as needed (For heartburn or acid reflux). 11/17/22   Aneita Gwendlyn DASEN, MD  QUEtiapine  (SEROQUEL ) 200 MG tablet Take 1 tablet (200 mg total) by mouth  at bedtime. 06/29/23   Cornelius Dines, MD  traZODone  (DESYREL ) 100 MG tablet Take 1 tablet (100 mg total) by mouth at bedtime. 06/29/23 08/28/23  Cornelius Dines, MD  WEGOVY 0.5 MG/0.5ML SOAJ Inject 0.5 mg into the skin. Patient not taking: Reported on 06/29/2024 03/04/24   [provider]  metFORMIN  (GLUCOPHAGE ) 500 MG tablet Take 1 tablet (500 mg total) by mouth 2 (two) times daily  with a meal. For diabetes management 06/09/18 11/30/19  Collene Mac FERNS, NP    Family History Family History  Problem Relation Age of Onset   Depression Mother    Osteoporosis Mother    COPD Mother    Rheum arthritis Mother    Diabetes Father    Hypertension Father    Congestive Heart Failure Father    Aneurysm Father    Clotting disorder Father    Heart disease Father    Cancer Maternal Grandfather    Colon cancer Neg Hx    Esophageal cancer Neg Hx    Rectal cancer Neg Hx    Stomach cancer Neg Hx     Social History Social History   Tobacco Use   Smoking status: Never   Smokeless tobacco: Never  Vaping Use   Vaping status: Never Used  Substance Use Topics   Alcohol  use: Not Currently   Drug use: Not Currently    Types: Heroin    Comment: no drug use for 3 years     Allergies   Sulfa antibiotics and Aspirin    Review of Systems Review of Systems  All other systems reviewed and are negative.    Physical Exam Triage Vital Signs ED Triage Vitals [06/29/24 1631]  Encounter Vitals Group     BP      Girls Systolic BP Percentile      Girls Diastolic BP Percentile      Boys Systolic BP Percentile      Boys Diastolic BP Percentile      Pulse      Resp      Temp      Temp src      SpO2      Weight      Height      Head Circumference      Peak Flow      Pain Score 7     Pain Loc      Pain Education      Exclude from Growth Chart    No data found.  Updated Vital Signs LMP 08/02/2013 Comment: irregular  Visual Acuity Right Eye Distance:   Left Eye Distance:   Bilateral  Distance:    Right Eye Near:   Left Eye Near:    Bilateral Near:     Physical Exam Vitals and nursing note reviewed.  Constitutional:      Appearance: She is well-developed.  HENT:     Head: Normocephalic.     Nose: Nose normal.     Comments: No obvious lesion  Cardiovascular:     Rate and Rhythm: Normal rate.  Pulmonary:     Effort: Pulmonary effort is normal.  Abdominal:     General: There is no distension.  Musculoskeletal:     Comments: Dryed healing areas left 2nd and 3rd finger tip,  Right foot,  tender area to palpation,  tiny area that looks like a puncture.  Examined with magnification, no obvious foreign body   Skin:    General: Skin is warm.  Neurological:     General: No focal deficit present.     Mental Status: She is alert and oriented to person, place, and time.      UC Treatments / Results  Labs (all labs ordered are listed, but only abnormal results are displayed) Labs Reviewed - No data to display  EKG   Radiology No results found.  Procedures Procedures (including critical care time)  Medications Ordered in UC Medications -  No data to display  Initial Impression / Assessment and Plan / UC Course  I have reviewed the triage vital signs and the nursing notes.  Pertinent labs & imaging results that were available during my care of the patient were reviewed by me and considered in my medical decision making (see chart for details).    Pt counseled that finger sores may have been herpetic.  Pt concerned that blisters may return.  Patient is given a prescription for Valtrex for possibility of reoccurrence Tender area on foot I do not see a foreign body there may be a small puncture.  Patient is counseled on the possibility of retained foreign body.  Patient given a prescription for Keflex  to cover for possible infection due to soreness and redness at the area.  Final Clinical Impressions(s) / UC Diagnoses   Final diagnoses:  Puncture wound of  right foot, initial encounter  Herpetic whitlow     Discharge Instructions      Soak your foot 20 minutes 4 times a day.  Take antibiotic as directed.  Take valacyclovir if blisters return    ED Prescriptions     Medication Sig Dispense Auth. Provider   cephALEXin  (KEFLEX ) 500 MG capsule Take 1 capsule (500 mg total) by mouth 4 (four) times daily. 28 capsule Ayiana Winslett K, [name]   valACYclovir (VALTREX) 1000 MG tablet Take 1 tablet (1,000 mg total) by mouth 2 (two) times daily. 10 tablet Ninetta Adelstein K, [name]      PDMP not reviewed this encounter. An After Visit Summary was printed and given to the patient.       Flint Sonny POUR, [name] 06/29/24 1730

## 2024-07-08 ENCOUNTER — Ambulatory Visit: Payer: MEDICAID

## 2024-07-12 ENCOUNTER — Telehealth (HOSPITAL_COMMUNITY): Payer: Self-pay | Admitting: *Deleted

## 2024-07-12 NOTE — Telephone Encounter (Signed)
 Pt called asking for referral to infectious disease. States provider told her to see PCP but she does not want to do that she just wants UC to refer her.   Advised she will need to see her PCP and get them to do a referral due to insurance and she verbalized understanding.

## 2024-08-05 ENCOUNTER — Ambulatory Visit: Payer: MEDICAID

## 2024-08-05 ENCOUNTER — Encounter (HOSPITAL_BASED_OUTPATIENT_CLINIC_OR_DEPARTMENT_OTHER): Payer: MEDICAID | Admitting: Radiology

## 2024-08-05 ENCOUNTER — Encounter (HOSPITAL_BASED_OUTPATIENT_CLINIC_OR_DEPARTMENT_OTHER): Payer: Self-pay

## 2024-08-05 DIAGNOSIS — Z1231 Encounter for screening mammogram for malignant neoplasm of breast: Secondary | ICD-10-CM

## 2024-08-11 ENCOUNTER — Other Ambulatory Visit: Payer: Self-pay | Admitting: Internal Medicine

## 2024-08-11 DIAGNOSIS — Z1231 Encounter for screening mammogram for malignant neoplasm of breast: Secondary | ICD-10-CM

## 2024-09-05 ENCOUNTER — Other Ambulatory Visit: Payer: Self-pay | Admitting: Nurse Practitioner

## 2024-09-05 DIAGNOSIS — I728 Aneurysm of other specified arteries: Secondary | ICD-10-CM

## 2024-09-07 ENCOUNTER — Encounter: Payer: Self-pay | Admitting: Nurse Practitioner

## 2024-09-09 ENCOUNTER — Inpatient Hospital Stay: Admission: RE | Admit: 2024-09-09 | Discharge: 2024-09-09 | Payer: MEDICAID

## 2024-09-09 DIAGNOSIS — Z1231 Encounter for screening mammogram for malignant neoplasm of breast: Secondary | ICD-10-CM

## 2024-09-12 ENCOUNTER — Other Ambulatory Visit: Payer: MEDICAID

## 2024-09-26 NOTE — Telephone Encounter (Signed)
 The patient called our office back. Provided her with the update on her CT scan. She will stop by to sign the form on Wednesday, 09/28/24, around 11am.

## 2024-10-07 ENCOUNTER — Ambulatory Visit: Payer: MEDICAID | Admitting: Obstetrics & Gynecology
